# Patient Record
Sex: Female | Born: 1985 | Race: Black or African American | Hispanic: No | Marital: Single | Smoking: Never smoker
Health system: Southern US, Community
[De-identification: ages and names within clinical notes are randomized; demographics above are authoritative.]

## PROBLEM LIST (undated history)

## (undated) ENCOUNTER — Inpatient Hospital Stay (HOSPITAL_COMMUNITY): Payer: Self-pay

## (undated) DIAGNOSIS — F329 Major depressive disorder, single episode, unspecified: Secondary | ICD-10-CM

## (undated) DIAGNOSIS — O139 Gestational [pregnancy-induced] hypertension without significant proteinuria, unspecified trimester: Secondary | ICD-10-CM

## (undated) DIAGNOSIS — F32A Depression, unspecified: Secondary | ICD-10-CM

## (undated) DIAGNOSIS — O24419 Gestational diabetes mellitus in pregnancy, unspecified control: Secondary | ICD-10-CM

## (undated) DIAGNOSIS — Z8619 Personal history of other infectious and parasitic diseases: Secondary | ICD-10-CM

## (undated) DIAGNOSIS — F419 Anxiety disorder, unspecified: Secondary | ICD-10-CM

## (undated) DIAGNOSIS — R51 Headache: Secondary | ICD-10-CM

## (undated) DIAGNOSIS — N390 Urinary tract infection, site not specified: Secondary | ICD-10-CM

## (undated) DIAGNOSIS — D649 Anemia, unspecified: Secondary | ICD-10-CM

## (undated) DIAGNOSIS — B951 Streptococcus, group B, as the cause of diseases classified elsewhere: Secondary | ICD-10-CM

## (undated) DIAGNOSIS — A599 Trichomoniasis, unspecified: Secondary | ICD-10-CM

## (undated) DIAGNOSIS — R87629 Unspecified abnormal cytological findings in specimens from vagina: Secondary | ICD-10-CM

## (undated) HISTORY — DX: Personal history of other infectious and parasitic diseases: Z86.19

## (undated) HISTORY — DX: Unspecified abnormal cytological findings in specimens from vagina: R87.629

## (undated) HISTORY — PX: COLPOSCOPY: SHX161

## (undated) HISTORY — DX: Streptococcus, group b, as the cause of diseases classified elsewhere: B95.1

---
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
## 2006-01-26 ENCOUNTER — Emergency Department (HOSPITAL_COMMUNITY): Admission: EM | Admit: 2006-01-26 | Discharge: 2006-01-26 | Payer: Self-pay | Admitting: Emergency Medicine

## 2006-01-27 ENCOUNTER — Emergency Department (HOSPITAL_COMMUNITY): Admission: EM | Admit: 2006-01-27 | Discharge: 2006-01-27 | Payer: Self-pay | Admitting: Family Medicine

## 2006-03-06 ENCOUNTER — Ambulatory Visit: Payer: Self-pay | Admitting: Family Medicine

## 2006-08-10 ENCOUNTER — Emergency Department (HOSPITAL_COMMUNITY): Admission: EM | Admit: 2006-08-10 | Discharge: 2006-08-10 | Payer: Self-pay | Admitting: Emergency Medicine

## 2007-03-05 ENCOUNTER — Emergency Department (HOSPITAL_COMMUNITY): Admission: EM | Admit: 2007-03-05 | Discharge: 2007-03-05 | Payer: Self-pay | Admitting: Emergency Medicine

## 2008-10-22 ENCOUNTER — Emergency Department (HOSPITAL_COMMUNITY): Admission: EM | Admit: 2008-10-22 | Discharge: 2008-10-22 | Payer: Self-pay | Admitting: Emergency Medicine

## 2009-12-11 ENCOUNTER — Emergency Department (HOSPITAL_COMMUNITY): Admission: EM | Admit: 2009-12-11 | Discharge: 2009-12-11 | Payer: Self-pay | Admitting: Emergency Medicine

## 2010-02-15 ENCOUNTER — Emergency Department (HOSPITAL_COMMUNITY): Admission: EM | Admit: 2010-02-15 | Discharge: 2010-02-15 | Payer: Self-pay | Admitting: Emergency Medicine

## 2010-03-30 LAB — OB RESULTS CONSOLE VARICELLA ZOSTER ANTIBODY, IGG: VARICELLA IGG: IMMUNE

## 2010-04-11 ENCOUNTER — Ambulatory Visit: Payer: Self-pay | Admitting: Obstetrics and Gynecology

## 2010-04-11 ENCOUNTER — Inpatient Hospital Stay (HOSPITAL_COMMUNITY): Admission: AD | Admit: 2010-04-11 | Discharge: 2010-04-11 | Payer: Self-pay | Admitting: Family Medicine

## 2010-05-17 ENCOUNTER — Ambulatory Visit (HOSPITAL_COMMUNITY): Admission: RE | Admit: 2010-05-17 | Discharge: 2010-05-17 | Payer: Self-pay | Admitting: Family Medicine

## 2010-07-24 ENCOUNTER — Inpatient Hospital Stay (HOSPITAL_COMMUNITY): Admission: AD | Admit: 2010-07-24 | Discharge: 2010-07-24 | Payer: Self-pay | Admitting: Obstetrics & Gynecology

## 2010-07-24 ENCOUNTER — Ambulatory Visit: Payer: Self-pay | Admitting: Obstetrics and Gynecology

## 2010-08-15 ENCOUNTER — Inpatient Hospital Stay (HOSPITAL_COMMUNITY): Admission: AD | Admit: 2010-08-15 | Discharge: 2010-08-16 | Payer: Self-pay | Admitting: Obstetrics and Gynecology

## 2010-08-25 ENCOUNTER — Ambulatory Visit: Payer: Self-pay | Admitting: Obstetrics & Gynecology

## 2010-09-01 ENCOUNTER — Ambulatory Visit: Payer: Self-pay | Admitting: Obstetrics & Gynecology

## 2010-09-02 ENCOUNTER — Encounter: Payer: Self-pay | Admitting: Obstetrics and Gynecology

## 2010-09-02 ENCOUNTER — Inpatient Hospital Stay (HOSPITAL_COMMUNITY): Admission: AD | Admit: 2010-09-02 | Discharge: 2010-09-03 | Payer: Self-pay | Admitting: Family Medicine

## 2010-09-02 DIAGNOSIS — O4700 False labor before 37 completed weeks of gestation, unspecified trimester: Secondary | ICD-10-CM

## 2010-09-15 ENCOUNTER — Ambulatory Visit: Payer: Self-pay | Admitting: Family Medicine

## 2010-09-22 ENCOUNTER — Encounter: Payer: Self-pay | Admitting: Obstetrics & Gynecology

## 2010-09-22 ENCOUNTER — Ambulatory Visit: Payer: Self-pay | Admitting: Obstetrics & Gynecology

## 2010-09-29 ENCOUNTER — Ambulatory Visit: Payer: Self-pay | Admitting: Obstetrics & Gynecology

## 2010-10-06 ENCOUNTER — Encounter: Payer: Self-pay | Admitting: Family Medicine

## 2010-10-06 ENCOUNTER — Ambulatory Visit: Payer: Self-pay | Admitting: Family Medicine

## 2010-10-06 LAB — CONVERTED CEMR LAB
ALT: 10 units/L (ref 0–35)
AST: 21 units/L (ref 0–37)
Alkaline Phosphatase: 215 units/L — ABNORMAL HIGH (ref 39–117)
Creatinine, Ser: 0.54 mg/dL (ref 0.40–1.20)
HCT: 31.2 % — ABNORMAL LOW (ref 36.0–46.0)
MCV: 87.2 fL (ref 78.0–100.0)
Platelets: 141 10*3/uL — ABNORMAL LOW (ref 150–400)
RDW: 13.4 % (ref 11.5–15.5)
Total Bilirubin: 0.6 mg/dL (ref 0.3–1.2)

## 2010-10-07 ENCOUNTER — Ambulatory Visit: Payer: Self-pay | Admitting: Obstetrics & Gynecology

## 2010-10-07 ENCOUNTER — Inpatient Hospital Stay (HOSPITAL_COMMUNITY)
Admission: AD | Admit: 2010-10-07 | Discharge: 2010-10-07 | Payer: Self-pay | Source: Home / Self Care | Attending: Obstetrics and Gynecology | Admitting: Obstetrics and Gynecology

## 2010-10-13 ENCOUNTER — Ambulatory Visit: Payer: Self-pay | Admitting: Obstetrics and Gynecology

## 2010-10-13 ENCOUNTER — Inpatient Hospital Stay (HOSPITAL_COMMUNITY)
Admission: AD | Admit: 2010-10-13 | Discharge: 2010-10-14 | Payer: Self-pay | Source: Home / Self Care | Attending: Family Medicine | Admitting: Family Medicine

## 2010-10-15 ENCOUNTER — Inpatient Hospital Stay (HOSPITAL_COMMUNITY)
Admission: AD | Admit: 2010-10-15 | Discharge: 2010-10-17 | Payer: Self-pay | Source: Home / Self Care | Attending: Obstetrics & Gynecology | Admitting: Obstetrics & Gynecology

## 2010-10-17 ENCOUNTER — Ambulatory Visit: Admit: 2010-10-17 | Payer: Self-pay | Admitting: Obstetrics and Gynecology

## 2010-12-24 ENCOUNTER — Emergency Department (HOSPITAL_COMMUNITY): Payer: Medicaid Other

## 2010-12-24 ENCOUNTER — Emergency Department (HOSPITAL_COMMUNITY)
Admission: EM | Admit: 2010-12-24 | Discharge: 2010-12-24 | Disposition: A | Discharge status: Home / Self Care | Payer: Medicaid Other | Attending: Emergency Medicine | Admitting: Emergency Medicine

## 2010-12-24 DIAGNOSIS — Y9229 Other specified public building as the place of occurrence of the external cause: Secondary | ICD-10-CM | POA: Insufficient documentation

## 2010-12-24 DIAGNOSIS — R51 Headache: Secondary | ICD-10-CM | POA: Insufficient documentation

## 2010-12-24 DIAGNOSIS — S01409A Unspecified open wound of unspecified cheek and temporomandibular area, initial encounter: Secondary | ICD-10-CM | POA: Insufficient documentation

## 2010-12-26 LAB — CBC
HCT: 26.3 % — ABNORMAL LOW (ref 36.0–46.0)
Hemoglobin: 10.6 g/dL — ABNORMAL LOW (ref 12.0–15.0)
Hemoglobin: 8.5 g/dL — ABNORMAL LOW (ref 12.0–15.0)
MCH: 28 pg (ref 26.0–34.0)
MCH: 28.2 pg (ref 26.0–34.0)
MCV: 86.5 fL (ref 78.0–100.0)
MCV: 87 fL (ref 78.0–100.0)
Platelets: 138 10*3/uL — ABNORMAL LOW (ref 150–400)
RBC: 3.04 MIL/uL — ABNORMAL LOW (ref 3.87–5.11)
RBC: 3.76 MIL/uL — ABNORMAL LOW (ref 3.87–5.11)

## 2010-12-26 LAB — POCT URINALYSIS DIPSTICK
Bilirubin Urine: NEGATIVE
Glucose, UA: NEGATIVE mg/dL
Hgb urine dipstick: NEGATIVE
Hgb urine dipstick: NEGATIVE
Ketones, ur: NEGATIVE mg/dL
Ketones, ur: NEGATIVE mg/dL
Nitrite: NEGATIVE
Protein, ur: NEGATIVE mg/dL
Protein, ur: NEGATIVE mg/dL
Specific Gravity, Urine: 1.025 (ref 1.005–1.030)
Specific Gravity, Urine: >=1.03 (ref 1.005–1.030)
Specific Gravity, Urine: >=1.03 (ref 1.005–1.030)
Urobilinogen, UA: 1 mg/dL (ref 0.0–1.0)
Urobilinogen, UA: 0.2 mg/dL (ref 0.0–1.0)
Urobilinogen, UA: 2 mg/dL — ABNORMAL HIGH (ref 0.0–1.0)
pH: 6.5 (ref 5.0–8.0)
pH: 6.5 (ref 5.0–8.0)
pH: 7 (ref 5.0–8.0)

## 2010-12-27 ENCOUNTER — Inpatient Hospital Stay (INDEPENDENT_AMBULATORY_CARE_PROVIDER_SITE_OTHER)
Admission: RE | Admit: 2010-12-27 | Discharge: 2010-12-27 | Disposition: A | Discharge status: Home / Self Care | Payer: Medicaid Other | Source: Ambulatory Visit | Attending: Family Medicine | Admitting: Family Medicine

## 2010-12-27 DIAGNOSIS — S01409A Unspecified open wound of unspecified cheek and temporomandibular area, initial encounter: Secondary | ICD-10-CM

## 2010-12-27 LAB — POCT URINALYSIS DIPSTICK
Hgb urine dipstick: NEGATIVE
Nitrite: NEGATIVE
Protein, ur: 30 mg/dL — AB
Protein, ur: NEGATIVE mg/dL
Specific Gravity, Urine: 1.02 (ref 1.005–1.030)
Urobilinogen, UA: 0.2 mg/dL (ref 0.0–1.0)
pH: 7 (ref 5.0–8.0)
pH: 7 (ref 5.0–8.0)

## 2010-12-27 LAB — WET PREP, GENITAL: Clue Cells Wet Prep HPF POC: NONE SEEN

## 2010-12-27 LAB — GC/CHLAMYDIA PROBE AMP, GENITAL: GC Probe Amp, Genital: NEGATIVE

## 2010-12-28 LAB — GC/CHLAMYDIA PROBE AMP, GENITAL
Chlamydia, DNA Probe: NEGATIVE
GC Probe Amp, Genital: NEGATIVE

## 2010-12-28 LAB — URINALYSIS, ROUTINE W REFLEX MICROSCOPIC
Bilirubin Urine: NEGATIVE
Nitrite: NEGATIVE
Protein, ur: NEGATIVE mg/dL
Specific Gravity, Urine: 1.02 (ref 1.005–1.030)
Specific Gravity, Urine: 1.01 (ref 1.005–1.030)
Urobilinogen, UA: 0.2 mg/dL (ref 0.0–1.0)
Urobilinogen, UA: 0.2 mg/dL (ref 0.0–1.0)
pH: 7.5 (ref 5.0–8.0)

## 2010-12-28 LAB — URINE DRUGS OF ABUSE SCREEN W ALC, ROUTINE (REF LAB)
Amphetamine Screen, Ur: NEGATIVE
Cocaine Metabolites: NEGATIVE
Creatinine,U: 150.3 mg/dL
Ethyl Alcohol: <10 mg/dL (ref <10)
Marijuana Metabolite: POSITIVE — AB
Opiate Screen, Urine: NEGATIVE
Propoxyphene: NEGATIVE

## 2010-12-28 LAB — GC/CHLAMYDIA PROBE AMP, URINE
Chlamydia, Swab/Urine, PCR: NEGATIVE
GC Probe Amp, Urine: NEGATIVE

## 2010-12-28 LAB — URINE CULTURE: Culture  Setup Time: 201110091725

## 2010-12-28 LAB — CBC
Hemoglobin: 11.1 g/dL — ABNORMAL LOW (ref 12.0–15.0)
MCH: 30.2 pg (ref 26.0–34.0)
Platelets: 165 10*3/uL (ref 150–400)
RBC: 3.69 MIL/uL — ABNORMAL LOW (ref 3.87–5.11)
WBC: 10.9 10*3/uL — ABNORMAL HIGH (ref 4.0–10.5)

## 2010-12-28 LAB — URINE MICROSCOPIC-ADD ON

## 2010-12-28 LAB — RPR: RPR Ser Ql: NONREACTIVE

## 2010-12-28 LAB — THC (MARIJUANA), URINE, CONFIRMATION: Marijuana, Ur-Confirmation: 30 NG/ML — ABNORMAL HIGH

## 2010-12-28 LAB — STREP B DNA PROBE
Strep Group B Ag: POSITIVE
Strep Group B Ag: NEGATIVE

## 2011-01-01 LAB — URINALYSIS, ROUTINE W REFLEX MICROSCOPIC
Bilirubin Urine: NEGATIVE
Hgb urine dipstick: NEGATIVE
Ketones, ur: 15 mg/dL — AB
Protein, ur: NEGATIVE mg/dL
Urobilinogen, UA: 0.2 mg/dL (ref 0.0–1.0)

## 2011-01-03 LAB — URINALYSIS, ROUTINE W REFLEX MICROSCOPIC
Bilirubin Urine: NEGATIVE
Glucose, UA: NEGATIVE mg/dL
Hgb urine dipstick: NEGATIVE
Ketones, ur: NEGATIVE mg/dL
Protein, ur: NEGATIVE mg/dL

## 2011-01-03 LAB — URINE MICROSCOPIC-ADD ON

## 2011-01-26 ENCOUNTER — Inpatient Hospital Stay (INDEPENDENT_AMBULATORY_CARE_PROVIDER_SITE_OTHER)
Admission: RE | Admit: 2011-01-26 | Discharge: 2011-01-26 | Disposition: A | Discharge status: Home / Self Care | Payer: Medicaid Other | Source: Ambulatory Visit | Attending: Family Medicine | Admitting: Family Medicine

## 2011-01-26 DIAGNOSIS — N390 Urinary tract infection, site not specified: Secondary | ICD-10-CM

## 2011-01-26 DIAGNOSIS — N760 Acute vaginitis: Secondary | ICD-10-CM

## 2011-01-26 LAB — POCT URINALYSIS DIP (DEVICE)
Nitrite: NEGATIVE
pH: 6 (ref 5.0–8.0)

## 2011-01-26 LAB — WET PREP, GENITAL: Trich, Wet Prep: NONE SEEN

## 2011-01-27 LAB — GC/CHLAMYDIA PROBE AMP, GENITAL: Chlamydia, DNA Probe: NEGATIVE

## 2011-01-30 LAB — URINALYSIS, ROUTINE W REFLEX MICROSCOPIC
Nitrite: NEGATIVE
Specific Gravity, Urine: 1.001 — ABNORMAL LOW (ref 1.005–1.030)
Urobilinogen, UA: 0.2 mg/dL (ref 0.0–1.0)

## 2011-01-30 LAB — URINE MICROSCOPIC-ADD ON

## 2011-01-30 LAB — PREGNANCY, URINE: Preg Test, Ur: NEGATIVE

## 2011-03-03 NOTE — Discharge Summary (Signed)
Margaret Cain, Margaret Cain                         ACCOUNT NO.:  [id number]   MEDICAL RECORD NO.:  [id number]                   PATIENT TYPE:  INP   LOCATION:  9152                                 FACILITY:  [facility]   PHYSICIAN:  Conni Elliot, M.D.             DATE OF BIRTH:  12-Feb-1986   DATE OF ADMISSION:  11/22/2003  DATE OF DISCHARGE:  11/27/2003                                 DISCHARGE SUMMARY   DISCHARGE DIAGNOSES:  1. Threatened preterm labor.  2. Intrauterine pregnancy at 30 weeks.  3. GBS bacteruria.   DISCHARGE MEDICATIONS:  Procardia XL 30 mg b.i.d.   SPECIAL INSTRUCTIONS:  The patient instructed to be on bedrest except for up  to eat and shower.   FOLLOWUP:  The patient has an  appointment next Wednesday morning at 9:45 in  the high risk clinic.   BRIEF ADMISSION HISTORY:  This is a 25 year old, G1, who presented at 29-2/7  weeks with cramping, increased pressure in her back and abdomen. The patient  had already received betamethasone times two in the outpatient clinic on  February 3rd and 4th.  She is reporting contractions every five to fifteen  minutes. No leakage of fluid. No vaginal bleeding. The patient was known to  have GBS bacteruria. She was taking iron and prenatal vitamins. The  patient's cervical exam prior to admission was fingertip, long, posterior;  however the patient was 1 cm, long, and posterior. On admission wet prep was  negative.  UA showed 11-20 white blood cells, 2 bacteria, a few squamous,  small leukocyte esterase.   HOSPITAL COURSE:  The patient was admitted for threatened preterm labor.  Urine specimen was sent for culture, but culture had no growth. The patient  was admitted and began on Procardia 30 mg b.i.d. Throughout hospitalization  the patient was continuously monitored by [facility] and external fetal  monitoring. No events on external fetal monitoring with good variability. No  decelerations. Toco revealed uterine irritability, but no  rhythmic  contractions.  The patient received Unasyn for six days during  hospitalization for treatment of her GBS bacteruria.  Social work was  consulted during hospitalization to determine patient's support at home and  it was found that the patient seemed to be supported by a godmother as well  as family; however, family home was crowded with about ten people living  within the home. The patient left hospital without getting prescription for  Procardia. That prescription was called into a pharmacy and the patient was  called to pick the prescription up.     Anastasio Auerbach, MD                          Conni Elliot, M.D.    AD/MEDQ  D:  11/27/2003  T:  11/28/2003  Job:  [id number]

## 2011-03-20 ENCOUNTER — Inpatient Hospital Stay (INDEPENDENT_AMBULATORY_CARE_PROVIDER_SITE_OTHER)
Admission: RE | Admit: 2011-03-20 | Discharge: 2011-03-20 | Disposition: A | Discharge status: Home / Self Care | Payer: Self-pay | Source: Ambulatory Visit | Attending: Family Medicine | Admitting: Family Medicine

## 2011-03-20 DIAGNOSIS — N898 Other specified noninflammatory disorders of vagina: Secondary | ICD-10-CM

## 2011-03-20 LAB — POCT URINALYSIS DIP (DEVICE)
Nitrite: NEGATIVE
Protein, ur: 30 mg/dL — AB
Urobilinogen, UA: 4 mg/dL — ABNORMAL HIGH (ref 0.0–1.0)
pH: 7.5 (ref 5.0–8.0)

## 2011-03-20 LAB — POCT I-STAT, CHEM 8
Creatinine, Ser: 0.9 mg/dL (ref 0.4–1.2)
Glucose, Bld: 88 mg/dL (ref 70–99)
Hemoglobin: 13.9 g/dL (ref 12.0–15.0)
TCO2: 27 mmol/L (ref 0–100)

## 2011-06-09 ENCOUNTER — Encounter: Payer: Self-pay | Admitting: *Deleted

## 2011-06-09 ENCOUNTER — Emergency Department (HOSPITAL_BASED_OUTPATIENT_CLINIC_OR_DEPARTMENT_OTHER)
Admission: EM | Admit: 2011-06-09 | Discharge: 2011-06-09 | Disposition: A | Discharge status: Home / Self Care | Payer: Medicaid Other | Attending: Emergency Medicine | Admitting: Emergency Medicine

## 2011-06-09 DIAGNOSIS — H6120 Impacted cerumen, unspecified ear: Secondary | ICD-10-CM | POA: Insufficient documentation

## 2011-06-09 DIAGNOSIS — T169XXA Foreign body in ear, unspecified ear, initial encounter: Secondary | ICD-10-CM

## 2011-06-09 DIAGNOSIS — IMO0002 Reserved for concepts with insufficient information to code with codable children: Secondary | ICD-10-CM | POA: Insufficient documentation

## 2011-06-09 MED ORDER — NEOMYCIN-COLIST-HC-THONZONIUM 3.3-3-10-0.5 MG/ML OT SUSP
4.0000 [drp] | OTIC | Status: DC
  Administered 2011-06-09: 4 [drp] via OTIC
  Filled 2011-06-09: qty 5

## 2011-06-09 MED ORDER — LIDOCAINE HCL 2 % IJ SOLN
2.0000 mL | Freq: Once | INTRAMUSCULAR | Status: AC
  Administered 2011-06-09: 40 mg

## 2011-06-09 MED ORDER — LIDOCAINE HCL 2 % IJ SOLN
INTRAMUSCULAR | Status: AC
  Filled 2011-06-09: qty 1

## 2011-06-09 NOTE — ED Notes (Signed)
Pt c/o part of q-tip stuck in left ear approx ago. Pt called EMS for complaint. Pt reports "i called them cause i knew i wouldn't be able to sleep tonight if i didn't". Pt also states she will need a cab voucher to go home in.

## 2011-06-09 NOTE — ED Provider Notes (Signed)
History     CSN: [id number] Arrival date & time: 06/09/2011 12:03 AM  Chief Complaint  Patient presents with  . Foreign Body in Ear   HPI This is a 25 year old female who was cleaning her ears in the shower just prior to arrival. She felt the end of a Q-tip break off in her left ear. She states it is causing significant pain in the left ear. She's not been able to remove it. She thinks it is in there deeply. Her hearing is decreased from the left ear as a result.  History reviewed. No pertinent past medical history.  History reviewed. No pertinent past surgical history.  No family history on file.  History  Substance Use Topics  . Smoking status: Never Smoker   . Smokeless tobacco: Not on file  . Alcohol Use: No    OB History    Grav Para Term Preterm Abortions TAB SAB Ect Mult Living                  Review of Systems  All other systems reviewed and are negative.    Physical Exam  BP 102/72  Pulse 68  Temp(Src) 97.9 F (36.6 C) (Oral)  Resp 16  SpO2 100%  LMP 05/29/2011  Physical Exam General: Well-developed, well-nourished female in no acute distress; appearance consistent with age of record; SULFA; cerumen and cotton plug is seen within left external auditory canal HENT: normocephalic, atraumatic Eyes: pupils equal round and reactive to light; extraocular muscles intact Neck: supple Heart: regular rate and rhythm Lungs: clear to auscultation bilaterally Abdomen: soft; nontender; nondistended Extremities: No deformity; full range of motion; pulses normal Neurologic: Awake, alert and oriented;motor function intact in all extremities and symmetric;no facial droop Skin: Warm and dry Psychiatric: Normal mood and affect   ED Course  FOREIGN BODY REMOVAL Date/Time: 06/09/2011 12:55 AM Performed by: Hanley Seamen Authorized by: Hanley Seamen Body area: ear Location details: left ear Anesthesia: local infiltration Local anesthetic: lidocaine 2% without  epinephrine Localization method: visualized and ENT speculum Removal mechanism: alligator forceps and irrigation Complexity: complex Objects recovered: Cerumen; cotton Post-procedure assessment: foreign body removed Patient tolerance: Patient tolerated the procedure well with no immediate complications.    MDM       Hanley Seamen, MD 06/09/11 [phone]

## 2011-06-09 NOTE — ED Notes (Signed)
Pt sent home with Cortisporin bottle and instructed on use. Pt was on the phone talking to someone, and waved her hand in response. Instructed pt to turn phone off and she responded "hold on im talking". Again instructed pt to turn to the side for ear drops, and pt then responded, "make it quick im talking". Pt turned on her side, drops were given, and pt continued to talk on phone. Pt refused to listen to discharge instructions, and instead talked on the phone throughout the whole instruction conversation given.

## 2011-09-17 ENCOUNTER — Encounter (HOSPITAL_COMMUNITY): Payer: Self-pay | Admitting: *Deleted

## 2011-09-17 ENCOUNTER — Inpatient Hospital Stay (HOSPITAL_COMMUNITY)
Admission: AD | Admit: 2011-09-17 | Discharge: 2011-09-17 | Disposition: A | Discharge status: Home / Self Care | Payer: Medicaid Other | Source: Ambulatory Visit | Attending: Obstetrics & Gynecology | Admitting: Obstetrics & Gynecology

## 2011-09-17 ENCOUNTER — Other Ambulatory Visit: Payer: Self-pay | Admitting: Obstetrics & Gynecology

## 2011-09-17 ENCOUNTER — Inpatient Hospital Stay (HOSPITAL_COMMUNITY): Payer: Medicaid Other

## 2011-09-17 DIAGNOSIS — O209 Hemorrhage in early pregnancy, unspecified: Secondary | ICD-10-CM | POA: Insufficient documentation

## 2011-09-17 DIAGNOSIS — O034 Incomplete spontaneous abortion without complication: Secondary | ICD-10-CM

## 2011-09-17 DIAGNOSIS — R109 Unspecified abdominal pain: Secondary | ICD-10-CM | POA: Insufficient documentation

## 2011-09-17 HISTORY — DX: Anemia, unspecified: D64.9

## 2011-09-17 LAB — CBC
HCT: 34.2 % — ABNORMAL LOW (ref 36.0–46.0)
Hemoglobin: 11.1 g/dL — ABNORMAL LOW (ref 12.0–15.0)
MCH: 28.1 pg (ref 26.0–34.0)
MCHC: 32.5 g/dL (ref 30.0–36.0)
MCV: 86.6 fL (ref 78.0–100.0)
Platelets: 189 10*3/uL (ref 150–400)
RBC: 3.95 MIL/uL (ref 3.87–5.11)
RDW: 13 % (ref 11.5–15.5)
WBC: 5.3 10*3/uL (ref 4.0–10.5)

## 2011-09-17 LAB — HCG, QUANTITATIVE, PREGNANCY: hCG, Beta Chain, Quant, S: 9051 m[IU]/mL — ABNORMAL HIGH (ref <5)

## 2011-09-17 MED ORDER — IBUPROFEN 800 MG PO TABS: 800.0000 mg | ORAL_TABLET | Freq: Three times a day (TID) | ORAL | Status: DC

## 2011-09-17 MED ORDER — PROMETHAZINE HCL 25 MG PO TABS: 25.0000 mg | ORAL_TABLET | Freq: Four times a day (QID) | ORAL | Status: DC | PRN

## 2011-09-17 MED ORDER — MISOPROSTOL 200 MCG PO TABS
200.0000 ug | ORAL_TABLET | Freq: Once | ORAL | Status: DC
Start: 2011-09-17 — End: 2011-09-17

## 2011-09-17 MED ORDER — IBUPROFEN 800 MG PO TABS: 800.0000 mg | ORAL_TABLET | Freq: Three times a day (TID) | ORAL | Status: AC

## 2011-09-17 MED ORDER — IBUPROFEN 800 MG PO TABS
800.0000 mg | ORAL_TABLET | Freq: Once | ORAL | Status: AC
  Administered 2011-09-17: 800 mg via ORAL
  Filled 2011-09-17: qty 1

## 2011-09-17 MED ORDER — HYDROCODONE-ACETAMINOPHEN 5-500 MG PO TABS: 1.0000 | ORAL_TABLET | Freq: Four times a day (QID) | ORAL | Status: DC | PRN

## 2011-09-17 MED ORDER — HYDROCODONE-ACETAMINOPHEN 5-500 MG PO TABS: 1.0000 | ORAL_TABLET | Freq: Four times a day (QID) | ORAL | Status: AC | PRN

## 2011-09-17 MED ORDER — MISOPROSTOL 200 MCG PO TABS: 200.0000 ug | ORAL_TABLET | Freq: Once | ORAL | Status: DC

## 2011-09-17 NOTE — Progress Notes (Signed)
Pt reprots having normal vaginal bleeding x2 days. Today the bleeding got very heavy  Passing large clots baseball sized. Pt reprots feeling weak. Pt reports she has missed her last 2 periods but does not think she is pregnant

## 2011-09-17 NOTE — ED Provider Notes (Signed)
History     CSN: [id number] Arrival date & time: 09/17/2011  9:02 AM   None     Chief Complaint  Patient presents with  . Vaginal Bleeding    (Consider location/radiation/quality/duration/timing/severity/associated sxs/prior treatment) HPIMeghan Cain Petit Cain is a 25 y.o. [id number]. Pt c/o bleeding, started lightly 3 days ago, got heavy 1 am. Is dark red, changing a pad q 3-4 hr, passing golf ball sized clots, having cramping. NO recent change in discharge, no UTI S&S or GI changes. 1 partner x 2 m, neg STD screen 05/24/11 with [facility].  Past Medical History  Diagnosis Date  . Anemia     Past Surgical History  Procedure Date  . No past surgeries     No family history on file.  History  Substance Use Topics  . Smoking status: Never Smoker   . Smokeless tobacco: Not on file  . Alcohol Use: No    OB History    Grav Para Term Preterm Abortions TAB SAB Ect Mult Living   3 2 2  0 0 0 0 0 0 2      Review of Systems  Constitutional: Negative for fever and chills.  Gastrointestinal: Negative.   Genitourinary: Positive for vaginal bleeding and pelvic pain. Negative for dysuria, urgency, frequency, vaginal discharge and difficulty urinating.    Allergies  Review of patient's allergies indicates no known allergies.  Home Medications  No current outpatient prescriptions on file.  BP 103/57  Pulse 74  Temp(Src) 97.3 F (36.3 C) (Oral)  Resp 18  Ht 5\' 1"  (1.549 m)  LMP 07/01/2011  Physical Exam  Constitutional: She is oriented to person, place, and time. She appears well-developed and well-nourished.  Genitourinary: There is no tenderness or lesion on the right labia. There is no tenderness or lesion on the left labia. Uterus is enlarged. Right adnexum displays no mass, no tenderness and no fullness. Left adnexum displays no mass, no tenderness and no fullness. There is bleeding around the vagina.       Mod amt bright red bleeding, prob POC in os, teased out  Musculoskeletal:  Normal range of motion.  Neurological: She is alert and oriented to person, place, and time.  Skin: Skin is warm and dry.  Psychiatric: She has a normal mood and affect. Her behavior is normal.    ED Course  Procedures (including critical care time)   Labs Reviewed  POCT PREGNANCY, URINE  POCT PREGNANCY, URINE  CBC  HCG, QUANTITATIVE, PREGNANCY   No results found. Results for orders placed during the hospital encounter of 09/17/11 (from the past 24 hour(s))  POCT PREGNANCY, URINE     Status: Normal   Collection Time   09/17/11  9:26 AM      Component Value Range   Preg Test, Ur POSITIVE    CBC     Status: Abnormal   Collection Time   09/17/11 10:03 AM      Component Value Range   WBC 5.3  4.0 - 10.5 (K/uL)   RBC 3.95  3.87 - 5.11 (MIL/uL)   Hemoglobin 11.1 (*) 12.0 - 15.0 (g/dL)   HCT 14.7 (*) 82.9 - 46.0 (%)   MCV 86.6  78.0 - 100.0 (fL)   MCH 28.1  26.0 - 34.0 (pg)   MCHC 32.5  30.0 - 36.0 (g/dL)   RDW 56.2  13.0 - 86.5 (%)   Platelets 189  150 - 400 (K/uL)  HCG, QUANTITATIVE, PREGNANCY     Status: Abnormal  Collection Time   09/17/11 10:03 AM      Component Value Range   hCG, Beta Chain, Quant, S 9051 (*) <5 (mIU/mL)    US Ob Comp Less 14 Wks  09/17/2011  *RADIOLOGY REPORT*  Clinical Data: vag bleeding; ; CRAMPING.  OBSTETRIC <14 WK [place] AND TRANSVAGINAL OB US  Technique: Both transabdominal and transvaginal ultrasound examinations were performed for complete evaluation of the gestation as well as the maternal uterus, adnexal regions, and pelvic cul-de-sac.  Comparison: 05/17/2010  Findings: No intrauterine pregnancy is visualized.  Endometrium is slightly heterogeneous and measures 14 mm in thickness.  Uterus is retroverted.  No focal abnormality within the uterus.  Ovaries are symmetric in size and echotexture.  No adnexal masses. No free fluid.  IMPRESSION: No intrauterine gestation visualized at this time.  Recommend correlation with quantitative beta HCG level.  This  likely will require follow-up with serial quantitative beta HCG levels and ultrasounds.  Original Report Authenticated By: Cyndie Chime, M.D.     No diagnosis found. ASSESSMENT:  Bleeding in pregnancy, probable incomplete abortion.  Pt blood type O+ per records POC to path  Results and plan of care discussed with the pt  PLAN:  cytotec at home, Rx for Motrin, Phenergan and vicodin as needed Return 12/4 am for repeat BHCG Ectopic precautions reviewed Consulted with Dr Debroah Loop   MDM          Avon Gully. Levaughn Puccinelli 09/17/11 1219

## 2011-09-17 NOTE — Progress Notes (Signed)
Removed possible POC. Pt still having small/moderate vag bleeding

## 2011-09-18 LAB — GC/CHLAMYDIA PROBE AMP, GENITAL
Chlamydia, DNA Probe: NEGATIVE
GC Probe Amp, Genital: NEGATIVE

## 2011-09-20 ENCOUNTER — Encounter (HOSPITAL_COMMUNITY): Payer: Self-pay | Admitting: *Deleted

## 2011-09-20 ENCOUNTER — Inpatient Hospital Stay (HOSPITAL_COMMUNITY)
Admission: AD | Admit: 2011-09-20 | Discharge: 2011-09-20 | Disposition: A | Discharge status: Home / Self Care | Payer: Medicaid Other | Source: Ambulatory Visit | Attending: Obstetrics & Gynecology | Admitting: Obstetrics & Gynecology

## 2011-09-20 ENCOUNTER — Encounter: Payer: Self-pay | Admitting: Obstetrics & Gynecology

## 2011-09-20 DIAGNOSIS — O039 Complete or unspecified spontaneous abortion without complication: Secondary | ICD-10-CM | POA: Insufficient documentation

## 2011-09-20 HISTORY — DX: Headache: R51

## 2011-09-20 HISTORY — DX: Urinary tract infection, site not specified: N39.0

## 2011-09-20 LAB — HCG, QUANTITATIVE, PREGNANCY: hCG, Beta Chain, Quant, S: 1513 m[IU]/mL — ABNORMAL HIGH (ref <5)

## 2011-09-20 NOTE — ED Notes (Signed)
Was given cytotec on Sun.  Pain, pain mgmt  And bleeding discussed.  Will have GYN clinic call with follow up.

## 2011-09-20 NOTE — ED Provider Notes (Signed)
History     No chief complaint on file.  HPI Margaret Cain 25 y.o. 11w 4d gestation.  Was seen on 09-17-11 with vaginal bleeding and clots.  Quant was 9051.  Nothing was seen in the uterus.  Was diagnosed with miscarriage and given Cytotec.  Returns today for repeat quant.   Is currently having some vaginal bleeding.  Still has periodic pain.  Today rates pain 4/10.  Last medication was Ibuprofen 800 mg at 2 pm yesterday and Vicodin at 8 am yesterday.  No pain medication since then and none today.  OB History    Grav Para Term Preterm Abortions TAB SAB Ect Mult Living   3 2 2  0 0 0 0 0 0 2      Past Medical History  Diagnosis Date  . Anemia     Past Surgical History  Procedure Date  . No past surgeries     No family history on file.  History  Substance Use Topics  . Smoking status: Never Smoker   . Smokeless tobacco: Not on file  . Alcohol Use: No    Allergies: No Known Allergies  Prescriptions prior to admission  Medication Sig Dispense Refill  . HYDROcodone-acetaminophen (VICODIN) 5-500 MG per tablet Take 1-2 tablets by mouth every 6 (six) hours as needed for pain.  15 tablet  0  . ibuprofen (ADVIL,MOTRIN) 800 MG tablet Take 1 tablet (800 mg total) by mouth 3 (three) times daily.  21 tablet  0  . misoprostol (CYTOTEC) 200 MCG tablet Take 1 tablet (200 mcg total) by mouth once.  4 tablet  0  . promethazine (PHENERGAN) 25 MG tablet Take 1 tablet (25 mg total) by mouth every 6 (six) hours as needed for nausea.  10 tablet  0    ROS Physical Exam   Blood pressure 124/54, pulse 77, temperature 99.4 F (37.4 C), temperature source Oral, resp. rate 18, last menstrual period 07/01/2011.  Physical Exam  Nursing note and vitals reviewed. Constitutional: She is oriented to person, place, and time. She appears well-developed and well-nourished.  HENT:  Head: Normocephalic.  Eyes: EOM are normal.  Neck: Neck supple.  Musculoskeletal: Normal range of motion.    Neurological: She is alert and oriented to person, place, and time.  Skin: Skin is warm and dry.  Psychiatric: She has a normal mood and affect.    MAU Course  Procedures  MDM Results for orders placed during the hospital encounter of 09/20/11 (from the past 24 hour(s))  HCG, QUANTITATIVE, PREGNANCY     Status: Abnormal   Collection Time   09/20/11  8:35 AM      Component Value Range   hCG, Beta Chain, Quant, S 1513 (*) <5 (mIU/mL)     Assessment and Plan  Miscarriage as evidenced by significant drop in quant  Plan Follow up in GYN clinic Message sent to clinic to schedule an appointment Discussed with client No sex until you have been seen in the clinic Expect to have bleeding 1-2 weeks Take pain medication if needed.   Demarko Zeimet 09/20/2011, 9:16 AM   Nolene Bernheim, NP 09/20/11 [phone]

## 2011-09-23 ENCOUNTER — Inpatient Hospital Stay (HOSPITAL_COMMUNITY)
Admission: AD | Admit: 2011-09-23 | Discharge: 2011-09-23 | Disposition: A | Discharge status: Home / Self Care | Payer: Medicaid Other | Source: Ambulatory Visit | Attending: Obstetrics & Gynecology | Admitting: Obstetrics & Gynecology

## 2011-09-23 ENCOUNTER — Encounter (HOSPITAL_COMMUNITY): Payer: Self-pay | Admitting: *Deleted

## 2011-09-23 DIAGNOSIS — O039 Complete or unspecified spontaneous abortion without complication: Secondary | ICD-10-CM

## 2011-09-23 LAB — CBC
Hemoglobin: 9.4 g/dL — ABNORMAL LOW (ref 12.0–15.0)
MCHC: 32.2 g/dL (ref 30.0–36.0)
RBC: 3.35 MIL/uL — ABNORMAL LOW (ref 3.87–5.11)
WBC: 5.9 10*3/uL (ref 4.0–10.5)

## 2011-09-23 LAB — HCG, QUANTITATIVE, PREGNANCY: hCG, Beta Chain, Quant, S: 456 m[IU]/mL — ABNORMAL HIGH (ref <5)

## 2011-09-23 MED ORDER — KETOROLAC TROMETHAMINE 60 MG/2ML IM SOLN
60.0000 mg | Freq: Once | INTRAMUSCULAR | Status: AC
  Administered 2011-09-23: 60 mg via INTRAMUSCULAR
  Filled 2011-09-23: qty 2

## 2011-09-23 NOTE — ED Provider Notes (Signed)
History     Chief Complaint  Patient presents with  . Vaginal Bleeding   The history is provided by the patient.  pt presents with vaginal bleeding in early pregnancy with known failed pregnancy with cytotec.  Pt was seen on 12/2 with vaginal bleeding with clots and HCG 9051 and ultrasound showing no IUGS.  She was given Cytotec at that time.  On 12/5 she returned with HCG of 1513 and has an appointment to f/u in GYN clinic on Monday 12/10.  This morning at 0500, pt woke up with a blood clot and increase cramping.  She has been changing a pad about 4 times a day.  She took Ibuprofen at 2100 on 09/22/2011. He pain is worse when she urinates or has bowel movement.   Past Medical History  Diagnosis Date  . Anemia   . Headache   . Urinary tract infection     Past Surgical History  Procedure Date  . No past surgeries     Family History  Problem Relation Age of Onset  . Anesthesia problems Neg Hx   . Hypotension Neg Hx   . Malignant hyperthermia Neg Hx   . Pseudochol deficiency Neg Hx     History  Substance Use Topics  . Smoking status: Never Smoker   . Smokeless tobacco: Never Used  . Alcohol Use: No    Allergies: No Known Allergies  Prescriptions prior to admission  Medication Sig Dispense Refill  . HYDROcodone-acetaminophen (VICODIN) 5-500 MG per tablet Take 1-2 tablets by mouth every 6 (six) hours as needed for pain.  15 tablet  0  . ibuprofen (ADVIL,MOTRIN) 800 MG tablet Take 1 tablet (800 mg total) by mouth 3 (three) times daily.  21 tablet  0  . promethazine (PHENERGAN) 25 MG tablet Take 1 tablet (25 mg total) by mouth every 6 (six) hours as needed for nausea.  10 tablet  0    Review of Systems  Constitutional: Negative for fever and chills.  Respiratory: Negative for cough.   Gastrointestinal: Negative for nausea and vomiting.  Genitourinary: Negative for dysuria.  Neurological: Negative for dizziness and headaches.   Physical Exam   Blood pressure 106/70,  pulse 84, temperature 98.4 F (36.9 C), temperature source Oral, resp. rate 20, height 5\' 1"  (1.549 m), weight 99 lb 3.2 oz (44.997 kg), last menstrual period 07/01/2011, SpO2 97.00%.  Physical Exam  Vitals reviewed. Constitutional: She is oriented to person, place, and time. She appears well-developed and well-nourished.  HENT:  Head: Normocephalic.  Eyes: Pupils are equal, round, and reactive to light.  Neck: Normal range of motion. Neck supple.  Cardiovascular: Normal rate.   Respiratory: Effort normal.  GI: Soft. She exhibits no distension. There is no tenderness. There is no rebound and no guarding.  Genitourinary:       Small amount of pink discharge in vault; cervix closed, clean; uterus NSSC NT; adnexal without palpable enlargement or tenderness  Musculoskeletal: Normal range of motion.  Neurological: She is alert and oriented to person, place, and time.  Skin: Skin is warm and dry.  Psychiatric: She has a normal mood and affect.    MAU Course  Procedures   Results for orders placed during the hospital encounter of 09/23/11 (from the past 24 hour(s))  CBC     Status: Abnormal   Collection Time   09/23/11  6:15 AM      Component Value Range   WBC 5.9  4.0 - 10.5 (K/uL)  RBC 3.35 (*) 3.87 - 5.11 (MIL/uL)   Hemoglobin 9.4 (*) 12.0 - 15.0 (g/dL)   HCT 16.1 (*) 09.6 - 46.0 (%)   MCV 87.2  78.0 - 100.0 (fL)   MCH 28.1  26.0 - 34.0 (pg)   MCHC 32.2  30.0 - 36.0 (g/dL)   RDW 04.5  40.9 - 81.1 (%)   Platelets 209  150 - 400 (K/uL)  HCG, QUANTITATIVE, PREGNANCY     Status: Abnormal   Collection Time   09/23/11  6:15 AM      Component Value Range   hCG, Beta Chain, Quant, S 456 (*) <5 (mIU/mL)    Assessment and Plan  Miscarriage with Cytotec- dropping HCG F/u in GYN clinic as scheduled  Albino Bufford 09/23/2011, 6:09 AM

## 2011-09-23 NOTE — Progress Notes (Signed)
G3P2 Found out last Sunday was pregnant. Had u/s and no baby seen in uterus. Follow up blood work Weds and blood was 1500. Called clinic Friday and has appt for Monday afternoon. Woke up at 0500 and saw blood clot when up to BR. Having pain mid upper abd. Sometimes after BM pain "drops down to my uterus".

## 2011-09-23 NOTE — ED Notes (Signed)
0600 S. Chase Picket NP in to see pt

## 2011-09-23 NOTE — Progress Notes (Signed)
Wende Bushy NP notified of pt's admission and status. Will see pt

## 2011-09-23 NOTE — Progress Notes (Signed)
Wende Bushy NP in to see pt. Spec exam done and pt tol well. No specimens obtained. Scant amt bleeding.

## 2011-09-23 NOTE — ED Notes (Signed)
Crackers and sprite to pt 

## 2011-09-25 ENCOUNTER — Ambulatory Visit (INDEPENDENT_AMBULATORY_CARE_PROVIDER_SITE_OTHER): Payer: Medicaid Other | Admitting: Physician Assistant

## 2011-09-25 ENCOUNTER — Encounter: Payer: Self-pay | Admitting: Physician Assistant

## 2011-09-25 VITALS — BP 126/66 | HR 95 | Temp 97.3°F | Ht 61.0 in | Wt 97.2 lb

## 2011-09-25 DIAGNOSIS — O021 Missed abortion: Secondary | ICD-10-CM

## 2011-09-25 MED ORDER — OXYCODONE-ACETAMINOPHEN 10-325 MG PO TABS: 1.0000 | ORAL_TABLET | Freq: Four times a day (QID) | ORAL | Status: AC | PRN

## 2011-09-25 MED ORDER — CYCLOBENZAPRINE HCL 10 MG PO TABS: 10.0000 mg | ORAL_TABLET | Freq: Three times a day (TID) | ORAL | Status: DC | PRN

## 2011-09-25 NOTE — Patient Instructions (Signed)
Incomplete Miscarriage Miscarriages in pregnancy are common. A miscarriage is a pregnancy that has ended before the twentieth week. You have had an incomplete miscarriage. Partial parts of the fetus or placenta (afterbirth) remain behind. Sometimes further treatment is needed. The most common reason for further treatment is continued bleeding (hemorrhage). Tissue left behind may also become infected. Treatment usually is curettage. Curettage for an incomplete abortion is a procedure in which the remaining products of pregnancy are removed. This can be done by a simple sucking procedure (suction curettage). It can also be done by a simple scraping (curettage) of the inside of the uterus (womb). This may be done in the hospital or in the caregiver's office. This is only done when your caregiver knows the pregnancy has ended. This is determined by physical examination and a negative pregnancy test. It may also include an ultrasound to confirm a dead fetus. The ultrasound may also prove that products of the pregnancy remain in the uterus. If your cervix remains dilated and you are still passing clots and tissue, your caregiver may wish to watch you for a little while. Your caregiver may want to see if you are going to finish passing all of the remaining parts of the pregnancy. If the bleeding continues, they may proceed with curettage. WHY DO I FEEL THIS WAY Miscarriages can be a very emotional time for prospective mothers. This is not you or your partner's fault. The miscarriage did not occur because of a lack in you or your partner. Nearly all miscarriages occur because the pregnancy has started off wrongly. At least half of miscarried pregnancies have a chromosomal abnormality (almost always not inherited). Others may have developmental problems with the fetus or placentas. Problems may not show up even when the products miscarried are studied under the microscope. You can usually begin trying for another  pregnancy as soon as your caregiver says it's okay. HOME CARE INSTRUCTIONS   Your caregiver may order bed rest (this means only getting up to use the bathroom). Your caregiver may allow you to continue light activity. If curettage was not done at this time, but you require further treatment.   Keep track of the number of pads you use each day. Keep track of how saturated (soaked) they are. Record this information.   Do not use tampons. Do not douche or have sexual intercourse until approved by your caregiver.   It is very important to keep all follow-up appointments for re-evaluation and continuing management.   Women who have an Rh negative blood type (ie, A, B, AB, or O negative) need to receive a drug called Rh(D) immune globulin. This medicine helps protect future fetuses against problems that can occur if an Rh negative mother is carrying a baby who is Rh positive.  SEEK IMMEDIATE MEDICAL CARE IF:   You experience severe cramps in your stomach, back, or abdomen.   You run an unexplained temperature (record these).   You pass large clots or tissue (save any tissue for your caregiver to inspect).   Your bleeding increases or you become light-headed, weak, or have fainting episodes.  MAKE SURE YOU:   Understand these instructions.   Will watch your condition.   Will get help right away if you are not doing well or get worse.  Document Released: 10/02/2005 Document Revised: 06/14/2011 Document Reviewed: 05/22/2008 [facility] Patient Information 2012 [place], [place].

## 2011-09-26 ENCOUNTER — Telehealth: Payer: Self-pay | Admitting: *Deleted

## 2011-09-26 LAB — HCG, QUANTITATIVE, PREGNANCY: hCG, Beta Chain, Quant, S: 247.2 m[IU]/mL

## 2011-09-26 NOTE — Telephone Encounter (Signed)
Called pt and left message on personal voice mail that her recent lab test shows improved hormone level. No further tests are needed. She may call back if she has questions or problems.

## 2011-09-26 NOTE — Telephone Encounter (Signed)
Message copied by Jill Side on Tue Sep 26, 2011  3:14 PM ------      Message from: August Luz      Created: Tue Sep 26, 2011  2:50 PM       Please call patient with result. No further labs needed.

## 2011-10-17 NOTE — L&D Delivery Note (Signed)
Delivery Note At 3:47 PM a viable female was delivered via Vaginal, Spontaneous Delivery (Presentation: Left Occiput Anterior).  APGAR: 9, 9; weight .   Placenta status: Intact, Spontaneous.  Cord: 3 vessels with the following complications: None.  Cord pH: not done  Anesthesia: Epidural  Episiotomy: None Lacerations:  Suture Repair: 2.0 Est. Blood Loss (mL):   Mom to postpartum.  Baby to nursery-stable.  Micahel Omlor A 07/17/2012, 4:03 PM

## 2012-02-24 ENCOUNTER — Encounter (HOSPITAL_COMMUNITY): Payer: Self-pay

## 2012-02-24 ENCOUNTER — Inpatient Hospital Stay (HOSPITAL_COMMUNITY)
Admission: AD | Admit: 2012-02-24 | Discharge: 2012-02-24 | Disposition: A | Discharge status: Home / Self Care | Payer: Medicaid Other | Source: Ambulatory Visit | Attending: Obstetrics and Gynecology | Admitting: Obstetrics and Gynecology

## 2012-02-24 DIAGNOSIS — N949 Unspecified condition associated with female genital organs and menstrual cycle: Secondary | ICD-10-CM | POA: Insufficient documentation

## 2012-02-24 DIAGNOSIS — L293 Anogenital pruritus, unspecified: Secondary | ICD-10-CM | POA: Insufficient documentation

## 2012-02-24 DIAGNOSIS — B3731 Acute candidiasis of vulva and vagina: Secondary | ICD-10-CM | POA: Insufficient documentation

## 2012-02-24 DIAGNOSIS — O2390 Unspecified genitourinary tract infection in pregnancy, unspecified trimester: Secondary | ICD-10-CM | POA: Insufficient documentation

## 2012-02-24 DIAGNOSIS — B373 Candidiasis of vulva and vagina: Secondary | ICD-10-CM

## 2012-02-24 LAB — WET PREP, GENITAL: Trich, Wet Prep: NONE SEEN

## 2012-02-24 LAB — URINALYSIS, ROUTINE W REFLEX MICROSCOPIC
Bilirubin Urine: NEGATIVE
Nitrite: NEGATIVE
Specific Gravity, Urine: 1.015 (ref 1.005–1.030)
Urobilinogen, UA: 0.2 mg/dL (ref 0.0–1.0)

## 2012-02-24 LAB — URINE MICROSCOPIC-ADD ON

## 2012-02-24 MED ORDER — FLUCONAZOLE 150 MG PO TABS
150.0000 mg | ORAL_TABLET | Freq: Once | ORAL | Status: AC
  Administered 2012-02-24: 150 mg via ORAL
  Filled 2012-02-24: qty 1

## 2012-02-24 MED ORDER — TERCONAZOLE 0.4 % VA CREA: 1.0000 | TOPICAL_CREAM | Freq: Every day | VAGINAL | Status: AC

## 2012-02-24 NOTE — Discharge Instructions (Signed)
Begin prenatal care as soon as possible. Get your prescription filled and use as directed

## 2012-02-24 NOTE — MAU Provider Note (Signed)
History     CSN: [id number]  Arrival date and time: 02/24/12 0121   First Provider Initiated Contact with Patient 02/24/12 0154      No chief complaint on file.  HPI Margaret Cain 26 y.o. [redacted]w[redacted]d Comes to MAU with vaginal itching and discharge.  Has not yet started prenatal care.  OB History    Grav Para Term Preterm Abortions TAB SAB Ect Mult Living   4 2 2  0 1 0 1 0 0 2      Past Medical History  Diagnosis Date  . Anemia   . Headache   . Urinary tract infection     Past Surgical History  Procedure Date  . No past surgeries     Family History  Problem Relation Age of Onset  . Anesthesia problems Neg Hx   . Hypotension Neg Hx   . Malignant hyperthermia Neg Hx   . Pseudochol deficiency Neg Hx     History  Substance Use Topics  . Smoking status: Never Smoker   . Smokeless tobacco: Never Used  . Alcohol Use: No    Allergies: No Known Allergies  Prescriptions prior to admission  Medication Sig Dispense Refill  . Prenatal Vit-Fe Fumarate-FA (PRENATAL MULTIVITAMIN) TABS Take 1 tablet by mouth daily.        Review of Systems  Gastrointestinal: Negative for nausea and vomiting.       Occasional cramping  Genitourinary: Negative for dysuria.       Vaginal discharge with itching   Physical Exam   Blood pressure 108/57, pulse 83, temperature 98 F (36.7 C), temperature source Oral, resp. rate 16, height 5' (1.524 m), weight 99 lb (44.906 kg), last menstrual period 12/13/2011.  Physical Exam  Nursing note and vitals reviewed. Constitutional: She is oriented to person, place, and time. She appears well-developed and well-nourished.  HENT:  Head: Normocephalic.  Eyes: EOM are normal.  Neck: Neck supple.  GI: Soft. There is no tenderness.       FHT 158 by doppler  Genitourinary:       Speculum exam: Vagina - Mod amount of grainy, adherent discharge, no odor Cervix - No contact bleeding Bimanual exam: Cervix closed Uterus 10w size Adnexa non tender,  no masses bilaterally GC/Chlam, wet prep done Chaperone present for exam.  Musculoskeletal: Normal range of motion.  Neurological: She is alert and oriented to person, place, and time.  Skin: Skin is warm and dry.  Psychiatric: She has a normal mood and affect.    MAU Course  Procedures  MDM Results for orders placed during the hospital encounter of 02/24/12 (from the past 24 hour(s))  URINALYSIS, ROUTINE W REFLEX MICROSCOPIC     Status: Abnormal   Collection Time   02/24/12  1:24 AM      Component Value Range   Color, Urine YELLOW  YELLOW    APPearance CLEAR  CLEAR    Specific Gravity, Urine 1.015  1.005 - 1.030    pH 6.5  5.0 - 8.0    Glucose, UA NEGATIVE  NEGATIVE (mg/dL)   Hgb urine dipstick TRACE (*) NEGATIVE    Bilirubin Urine NEGATIVE  NEGATIVE    Ketones, ur NEGATIVE  NEGATIVE (mg/dL)   Protein, ur NEGATIVE  NEGATIVE (mg/dL)   Urobilinogen, UA 0.2  0.0 - 1.0 (mg/dL)   Nitrite NEGATIVE  NEGATIVE    Leukocytes, UA LARGE (*) NEGATIVE   URINE MICROSCOPIC-ADD ON     Status: Abnormal   Collection Time  02/24/12  1:24 AM      Component Value Range   Squamous Epithelial / LPF FEW (*) RARE    WBC, UA 7-10  <3 (WBC/hpf)   RBC / HPF 3-6  <3 (RBC/hpf)   Bacteria, UA FEW (*) RARE   POCT PREGNANCY, URINE     Status: Abnormal   Collection Time   02/24/12  1:32 AM      Component Value Range   Preg Test, Ur POSITIVE (*) NEGATIVE   WET PREP, GENITAL     Status: Abnormal   Collection Time   02/24/12  1:50 AM      Component Value Range   Yeast Wet Prep HPF POC MODERATE (*) NONE SEEN    Trich, Wet Prep NONE SEEN  NONE SEEN    Clue Cells Wet Prep HPF POC MODERATE (*) NONE SEEN    WBC, Wet Prep HPF POC MODERATE (*) NONE SEEN     Assessment and Plan  Yeast infection in pregnancy  Plan Begin prenatal care as soon as possible rx terazol vaginal cream one applicatorful in vagina at bedtime x 7 days (1 tube) no refills Will give diflucan 150 mg po single dose  now.   Amanda Pote 02/24/2012, 1:55 AM

## 2012-02-24 NOTE — MAU Note (Signed)
Pt states boyfriend told her he may have given her an STD, states his last STD check was negative but still thinks something is wrong. Reports vaginal swelling and itching x2 days. Thick white discharge during pregnancy. Denies vaginal bleeding. Reports cramping in middle abdomen since yesterday. Burning with urination tonight.

## 2012-02-26 LAB — GC/CHLAMYDIA PROBE AMP, GENITAL: GC Probe Amp, Genital: NEGATIVE

## 2012-03-11 NOTE — MAU Provider Note (Signed)
Attestation of Attending Supervision of Advanced Practitioner: Evaluation and management procedures were performed by the PA/NP/CNM/OB Fellow under my supervision/collaboration. Chart reviewed and agree with management and plan.  Johnanna Bakke V 03/11/2012 8:56 PM

## 2012-03-12 ENCOUNTER — Telehealth (HOSPITAL_COMMUNITY): Payer: Self-pay | Admitting: *Deleted

## 2012-04-17 ENCOUNTER — Encounter (HOSPITAL_COMMUNITY): Payer: Self-pay | Admitting: *Deleted

## 2012-04-17 ENCOUNTER — Emergency Department (HOSPITAL_COMMUNITY)
Admission: EM | Admit: 2012-04-17 | Discharge: 2012-04-17 | Disposition: A | Discharge status: Home / Self Care | Payer: Medicaid Other | Attending: Emergency Medicine | Admitting: Emergency Medicine

## 2012-04-17 DIAGNOSIS — R05 Cough: Secondary | ICD-10-CM | POA: Insufficient documentation

## 2012-04-17 DIAGNOSIS — Z8249 Family history of ischemic heart disease and other diseases of the circulatory system: Secondary | ICD-10-CM | POA: Insufficient documentation

## 2012-04-17 DIAGNOSIS — Z862 Personal history of diseases of the blood and blood-forming organs and certain disorders involving the immune mechanism: Secondary | ICD-10-CM | POA: Insufficient documentation

## 2012-04-17 DIAGNOSIS — R0981 Nasal congestion: Secondary | ICD-10-CM

## 2012-04-17 DIAGNOSIS — R059 Cough, unspecified: Secondary | ICD-10-CM

## 2012-04-17 DIAGNOSIS — J3489 Other specified disorders of nose and nasal sinuses: Secondary | ICD-10-CM | POA: Insufficient documentation

## 2012-04-17 DIAGNOSIS — R51 Headache: Secondary | ICD-10-CM | POA: Insufficient documentation

## 2012-04-17 DIAGNOSIS — R519 Headache, unspecified: Secondary | ICD-10-CM

## 2012-04-17 DIAGNOSIS — Z87440 Personal history of urinary (tract) infections: Secondary | ICD-10-CM | POA: Insufficient documentation

## 2012-04-17 MED ORDER — DIPHENHYDRAMINE HCL 25 MG PO CAPS
25.0000 mg | ORAL_CAPSULE | Freq: Once | ORAL | Status: AC
  Administered 2012-04-17: 25 mg via ORAL
  Filled 2012-04-17: qty 1

## 2012-04-17 NOTE — ED Notes (Signed)
Patient in to room 34 to sit with her friend.

## 2012-04-17 NOTE — ED Provider Notes (Signed)
History     CSN: [id number]  Arrival date & time 04/17/12  8413   First MD Initiated Contact with Patient 04/17/12 [phone]      Chief Complaint  Patient presents with  . Cough    (Consider location/radiation/quality/duration/timing/severity/associated sxs/prior treatment) HPI Comments: Pt is about [redacted] weeks pregnant, she has HA, nasal congestion and cough, hasn't been able to sleep much in past few nights, mainly due to coughing.  She denies allergies typically.  She is otherwise healthy.  Has not taken any meds PTA.  She doesn't smoke.    Patient is a 26 y.o. female presenting with cough. The history is provided by the patient.  Cough Associated symptoms include headaches. Pertinent negatives include no chills, no sore throat, no shortness of breath and no wheezing.    Past Medical History  Diagnosis Date  . Anemia   . Headache   . Urinary tract infection     Past Surgical History  Procedure Date  . No past surgeries     Family History  Problem Relation Age of Onset  . Anesthesia problems Neg Hx   . Hypotension Neg Hx   . Malignant hyperthermia Neg Hx   . Pseudochol deficiency Neg Hx     History  Substance Use Topics  . Smoking status: Never Smoker   . Smokeless tobacco: Never Used  . Alcohol Use: No    OB History    Grav Para Term Preterm Abortions TAB SAB Ect Mult Living   4 2 2  0 1 0 1 0 0 2      Review of Systems  Constitutional: Negative for fever and chills.  HENT: Positive for congestion. Negative for sore throat.   Respiratory: Positive for cough. Negative for shortness of breath and wheezing.   Gastrointestinal: Negative for nausea, vomiting and abdominal pain.  Genitourinary: Negative for vaginal bleeding.  Neurological: Positive for headaches.    Allergies  Review of patient's allergies indicates no known allergies.  Home Medications   Current Outpatient Rx  Name Route Sig Dispense Refill  . PRENATAL MULTIVITAMIN CH Oral Take 1 tablet by  mouth daily.      BP 93/62  Pulse 105  Temp 98.2 F (36.8 C) (Oral)  Resp 20  SpO2 97%  LMP 12/13/2011  Physical Exam  Nursing note and vitals reviewed. Constitutional: She appears well-developed and well-nourished. No distress.  HENT:  Head: Normocephalic.  Nose: Mucosal edema present.  Mouth/Throat: Uvula is midline and oropharynx is clear and moist.  Cardiovascular: Normal rate.   Pulmonary/Chest: Effort normal. No respiratory distress. She has no wheezes.       + dry cough  Abdominal: Soft.       gravid    ED Course  Procedures (including critical care time)  Labs Reviewed - No data to display No results found.   1. Nasal congestion   2. Sinus headache   3. Cough     I provided literature regarding pregnancy and safe medications.    MDM  RA sat is 97% and normal by my interpretation.  I discussed risk and benefits of category C medication like Tussionex and pt declines use.  I informed her that benadryl and tylenol are safe in pregnancy.  I suspect she has allergies, or simply congestion related to pregnancy which is common and she has some post nasal drainage causing cough.  No fever.  Lungs clear.  Don't suspect pneumonia or sig bronchitis.  Benadryl given here.  Gavin Pound. Oletta Lamas, MD 04/17/12 1610

## 2012-04-17 NOTE — ED Notes (Signed)
Patient presents with c/o cough for the past 2 days.  Lungs clear, dry cough

## 2012-04-20 ENCOUNTER — Inpatient Hospital Stay (HOSPITAL_COMMUNITY): Payer: Medicaid Other

## 2012-04-20 ENCOUNTER — Encounter (HOSPITAL_COMMUNITY): Payer: Self-pay | Admitting: *Deleted

## 2012-04-20 ENCOUNTER — Inpatient Hospital Stay (HOSPITAL_COMMUNITY)
Admission: AD | Admit: 2012-04-20 | Discharge: 2012-04-20 | Disposition: A | Discharge status: Home / Self Care | Payer: Medicaid Other | Source: Ambulatory Visit | Attending: Obstetrics | Admitting: Obstetrics

## 2012-04-20 DIAGNOSIS — M545 Low back pain, unspecified: Secondary | ICD-10-CM

## 2012-04-20 DIAGNOSIS — M62830 Muscle spasm of back: Secondary | ICD-10-CM

## 2012-04-20 DIAGNOSIS — W19XXXA Unspecified fall, initial encounter: Secondary | ICD-10-CM

## 2012-04-20 DIAGNOSIS — W010XXA Fall on same level from slipping, tripping and stumbling without subsequent striking against object, initial encounter: Secondary | ICD-10-CM | POA: Insufficient documentation

## 2012-04-20 DIAGNOSIS — R1084 Generalized abdominal pain: Secondary | ICD-10-CM

## 2012-04-20 DIAGNOSIS — O368190 Decreased fetal movements, unspecified trimester, not applicable or unspecified: Secondary | ICD-10-CM

## 2012-04-20 DIAGNOSIS — Z3490 Encounter for supervision of normal pregnancy, unspecified, unspecified trimester: Secondary | ICD-10-CM

## 2012-04-20 DIAGNOSIS — M549 Dorsalgia, unspecified: Secondary | ICD-10-CM | POA: Insufficient documentation

## 2012-04-20 DIAGNOSIS — Y92009 Unspecified place in unspecified non-institutional (private) residence as the place of occurrence of the external cause: Secondary | ICD-10-CM | POA: Insufficient documentation

## 2012-04-20 DIAGNOSIS — R109 Unspecified abdominal pain: Secondary | ICD-10-CM | POA: Insufficient documentation

## 2012-04-20 DIAGNOSIS — S39011A Strain of muscle, fascia and tendon of abdomen, initial encounter: Secondary | ICD-10-CM

## 2012-04-20 LAB — CBC
HCT: 30.7 % — ABNORMAL LOW (ref 36.0–46.0)
Hemoglobin: 10 g/dL — ABNORMAL LOW (ref 12.0–15.0)
MCH: 28.2 pg (ref 26.0–34.0)
MCHC: 32.6 g/dL (ref 30.0–36.0)
MCV: 86.7 fL (ref 78.0–100.0)
Platelets: 187 10*3/uL (ref 150–400)
RBC: 3.54 MIL/uL — ABNORMAL LOW (ref 3.87–5.11)
RDW: 12.5 % (ref 11.5–15.5)
WBC: 8.3 10*3/uL (ref 4.0–10.5)

## 2012-04-20 LAB — URINE MICROSCOPIC-ADD ON

## 2012-04-20 LAB — URINALYSIS, ROUTINE W REFLEX MICROSCOPIC
Bilirubin Urine: NEGATIVE
Glucose, UA: NEGATIVE mg/dL
Ketones, ur: NEGATIVE mg/dL
Nitrite: NEGATIVE
pH: 7.5 (ref 5.0–8.0)

## 2012-04-20 MED ORDER — CYCLOBENZAPRINE HCL 10 MG PO TABS
5.0000 mg | ORAL_TABLET | Freq: Once | ORAL | Status: AC
  Administered 2012-04-20: 5 mg via ORAL
  Filled 2012-04-20: qty 1

## 2012-04-20 MED ORDER — GUAIFENESIN 100 MG/5ML PO SOLN
5.0000 mL | Freq: Once | ORAL | Status: AC
  Administered 2012-04-20: 100 mg via ORAL
  Filled 2012-04-20: qty 15

## 2012-04-20 MED ORDER — ACETAMINOPHEN 325 MG PO TABS
650.0000 mg | ORAL_TABLET | Freq: Once | ORAL | Status: AC
  Administered 2012-04-20: 650 mg via ORAL
  Filled 2012-04-20: qty 2

## 2012-04-20 NOTE — MAU Note (Signed)
Slipped around 12:30 after mopping the floor onto her back has not felt the baby move, no vaginal bleeding sees Dr.  Gaynell Face for her prenatal care [facility] 08/28/2012

## 2012-04-20 NOTE — MAU Note (Signed)
Pt reports she slipped and fell backward on her back. Did not feel baby move. Pt reports feeling baby move now after she heard the heart beat.

## 2012-04-20 NOTE — MAU Provider Note (Signed)
History     CSN: [id number]  Arrival date & time 04/20/12  1413   None     Chief Complaint  Patient presents with  . Decreased Fetal Movement   HPI  Margaret Cain is a 26 y.o. female @ [redacted]w[redacted]d gestational who presents to MAU for abdominal pain and back pain. She reports that she fell earlier today while mopping her kitchen floor. She slipped and fell on her back. Shortly after she felt tightening in her lower abdomen. Did not think the baby was moving as much as usual but after got here felt a lot of movement. Cramping in stomach has improved but has continued to have low back pain. Denies vaginal bleeding or leaking of fluid. The back pain is in the lower back. She rates her pain as 8/10. The history was provided by the patient.   Past Medical History  Diagnosis Date  . Anemia   . Headache   . Urinary tract infection     Past Surgical History  Procedure Date  . No past surgeries     Family History  Problem Relation Age of Onset  . Anesthesia problems Neg Hx   . Hypotension Neg Hx   . Malignant hyperthermia Neg Hx   . Pseudochol deficiency Neg Hx     History  Substance Use Topics  . Smoking status: Never Smoker   . Smokeless tobacco: Never Used  . Alcohol Use: No    OB History    Grav Para Term Preterm Abortions TAB SAB Ect Mult Living   4 2 2  0 1 0 1 0 0 2      Review of Systems  Constitutional: Negative for fever, chills, diaphoresis and fatigue.  HENT: Positive for congestion. Negative for ear pain, sore throat, facial swelling, neck pain, neck stiffness, dental problem and sinus pressure.   Eyes: Negative for photophobia, pain, discharge and visual disturbance.  Respiratory: Positive for cough. Negative for chest tightness and wheezing.   Cardiovascular: Negative for chest pain and palpitations.  Gastrointestinal: Positive for abdominal pain. Negative for nausea, vomiting, diarrhea, constipation and abdominal distention.  Genitourinary: Positive for pelvic  pain. Negative for dysuria, urgency, frequency, flank pain, vaginal bleeding, vaginal discharge and difficulty urinating.  Musculoskeletal: Positive for back pain. Negative for myalgias and gait problem.  Skin: Negative for color change and rash.  Neurological: Negative for dizziness, speech difficulty, weakness, light-headedness, numbness and headaches.  Psychiatric/Behavioral: Negative for confusion and agitation. The patient is not nervous/anxious.     Allergies  Review of patient's allergies indicates no known allergies.  Home Medications  No current outpatient prescriptions on file.  BP 96/61  Pulse 102  Temp 98.7 F (37.1 C) (Oral)  Resp 16  Ht 5\' 1"  (1.549 m)  Wt 102 lb 3.2 oz (46.358 kg)  BMI 19.31 kg/m2  LMP 12/13/2011  Physical Exam  Nursing note and vitals reviewed. Constitutional: She is oriented to person, place, and time. She appears well-developed and well-nourished.  HENT:  Head: Normocephalic.  Eyes: EOM are normal.  Neck: Neck supple.  Cardiovascular: Normal rate.   Pulmonary/Chest: Effort normal.  Abdominal: Soft. There is tenderness in the right lower quadrant and left lower quadrant. There is no rebound, no guarding and no CVA tenderness.       + FHT's  Musculoskeletal:       Tender with palpation and ROM left lumbar area.  Neurological: She is alert and oriented to person, place, and time. No cranial nerve deficit.  Skin: Skin is warm and dry.  Psychiatric: She has a normal mood and affect. Her behavior is normal. Judgment and thought content normal.   Results for orders placed during the hospital encounter of 04/20/12 (from the past 24 hour(s))  CBC     Status: Abnormal   Collection Time   04/20/12  3:20 PM      Component Value Range   WBC 8.3  4.0 - 10.5 K/uL   RBC 3.54 (*) 3.87 - 5.11 MIL/uL   Hemoglobin 10.0 (*) 12.0 - 15.0 g/dL   HCT 16.1 (*) 09.6 - 04.5 %   MCV 86.7  78.0 - 100.0 fL   MCH 28.2  26.0 - 34.0 pg   MCHC 32.6  30.0 - 36.0 g/dL     RDW 40.9  81.1 - 91.4 %   Platelets 187  150 - 400 K/uL  URINALYSIS, ROUTINE W REFLEX MICROSCOPIC     Status: Abnormal   Collection Time   04/20/12  7:45 PM      Component Value Range   Color, Urine YELLOW  YELLOW   APPearance CLEAR  CLEAR   Specific Gravity, Urine 1.010  1.005 - 1.030   pH 7.5  5.0 - 8.0   Glucose, UA NEGATIVE  NEGATIVE mg/dL   Hgb urine dipstick NEGATIVE  NEGATIVE   Bilirubin Urine NEGATIVE  NEGATIVE   Ketones, ur NEGATIVE  NEGATIVE mg/dL   Protein, ur NEGATIVE  NEGATIVE mg/dL   Urobilinogen, UA 1.0  0.0 - 1.0 mg/dL   Nitrite NEGATIVE  NEGATIVE   Leukocytes, UA SMALL (*) NEGATIVE  URINE MICROSCOPIC-ADD ON     Status: Abnormal   Collection Time   04/20/12  7:45 PM      Component Value Range   Squamous Epithelial / LPF MANY (*) RARE   WBC, UA 11-20  <3 WBC/hpf   Bacteria, UA RARE  RARE   Urine-Other MUCOUS PRESENT     US Ob Limited  04/20/2012  *RADIOLOGY REPORT*  Clinical Data: Fall, low abdominal pain.  [redacted] weeks pregnant  LIMITED OBSTETRIC ULTRASOUND  Number of Fetuses: 1 Heart Rate: 155bpm Movement: Appropriate Presentation: Cephalic Placental Location: Lateral Previa: None Amniotic Fluid (Subjective): Normal  Vertical Pocket 5.8 cm  BPD:  5.5cm 22w   sixd        EDC:  08/18/2012  MATERNAL FINDINGS: Cervix: 3.2 cm Uterus/Adnexae:  Normal ovaries  IMPRESSION: Single intrauterine fetus with normal heart rate.  Estimated gestational age by biparietal diameter equals 22 weeks extends.  Recommend followup with non-emergent complete OB 14+ wk US examination for fetal biometric evaluation and anatomic survey if not already performed.  Original Report Authenticated By: Genevive Bi, M.D.   Assessment: 26 y.o. @ [redacted]w[redacted]d gestation s/p fall   Muscle strain/low back pain   Abdominal pain   No hematuria    Plan:  Flexeril 5 mg po now   Tylenol as needed for discomfort   Follow up in the office   Return as needed. ED Course  Procedures  Re evaluation: Patient feeling  much better after flexeril. Has been sleeping in exam room.  MDM

## 2012-05-22 ENCOUNTER — Inpatient Hospital Stay (HOSPITAL_COMMUNITY)
Admission: AD | Admit: 2012-05-22 | Discharge: 2012-05-24 | DRG: 781 | Disposition: A | Discharge status: Home / Self Care | Payer: Medicaid Other | Source: Ambulatory Visit | Attending: Obstetrics | Admitting: Obstetrics

## 2012-05-22 ENCOUNTER — Encounter (HOSPITAL_COMMUNITY): Payer: Self-pay | Admitting: *Deleted

## 2012-05-22 DIAGNOSIS — N390 Urinary tract infection, site not specified: Secondary | ICD-10-CM | POA: Diagnosis present

## 2012-05-22 DIAGNOSIS — O368190 Decreased fetal movements, unspecified trimester, not applicable or unspecified: Secondary | ICD-10-CM | POA: Diagnosis present

## 2012-05-22 DIAGNOSIS — O99891 Other specified diseases and conditions complicating pregnancy: Principal | ICD-10-CM | POA: Diagnosis present

## 2012-05-22 DIAGNOSIS — O021 Missed abortion: Secondary | ICD-10-CM

## 2012-05-22 DIAGNOSIS — O210 Mild hyperemesis gravidarum: Secondary | ICD-10-CM | POA: Diagnosis present

## 2012-05-22 DIAGNOSIS — O2390 Unspecified genitourinary tract infection in pregnancy, unspecified trimester: Secondary | ICD-10-CM | POA: Diagnosis present

## 2012-05-22 DIAGNOSIS — R109 Unspecified abdominal pain: Secondary | ICD-10-CM | POA: Diagnosis present

## 2012-05-22 DIAGNOSIS — M549 Dorsalgia, unspecified: Secondary | ICD-10-CM | POA: Diagnosis present

## 2012-05-22 HISTORY — DX: Depression, unspecified: F32.A

## 2012-05-22 HISTORY — DX: Gestational (pregnancy-induced) hypertension without significant proteinuria, unspecified trimester: O13.9

## 2012-05-22 HISTORY — DX: Major depressive disorder, single episode, unspecified: F32.9

## 2012-05-22 LAB — URINALYSIS, ROUTINE W REFLEX MICROSCOPIC
Nitrite: NEGATIVE
Specific Gravity, Urine: >1.03 — ABNORMAL HIGH (ref 1.005–1.030)
Urobilinogen, UA: 0.2 mg/dL (ref 0.0–1.0)

## 2012-05-22 LAB — RAPID URINE DRUG SCREEN, HOSP PERFORMED
Barbiturates: NOT DETECTED
Tetrahydrocannabinol: NOT DETECTED

## 2012-05-22 LAB — WET PREP, GENITAL
Trich, Wet Prep: NONE SEEN
Yeast Wet Prep HPF POC: NONE SEEN

## 2012-05-22 LAB — CBC WITH DIFFERENTIAL/PLATELET
Basophils Absolute: 0 10*3/uL (ref 0.0–0.1)
Basophils Relative: 0 % (ref 0–1)
Eosinophils Relative: 1 % (ref 0–5)
Lymphocytes Relative: 15 % (ref 12–46)
MCHC: 32.5 g/dL (ref 30.0–36.0)
MCV: 87.7 fL (ref 78.0–100.0)
Neutro Abs: 7.8 10*3/uL — ABNORMAL HIGH (ref 1.7–7.7)
Platelets: 158 10*3/uL (ref 150–400)
RDW: 12.8 % (ref 11.5–15.5)
WBC: 10.2 10*3/uL (ref 4.0–10.5)

## 2012-05-22 LAB — URINE MICROSCOPIC-ADD ON

## 2012-05-22 LAB — MAGNESIUM: Magnesium: 4.9 mg/dL — ABNORMAL HIGH (ref 1.5–2.5)

## 2012-05-22 LAB — RUBELLA SCREEN: Rubella: 54 IU/mL

## 2012-05-22 LAB — RPR: RPR Ser Ql: NONREACTIVE

## 2012-05-22 LAB — PREPARE RBC (CROSSMATCH)

## 2012-05-22 LAB — RAPID HIV SCREEN (WH-MAU): Rapid HIV Screen: NONREACTIVE

## 2012-05-22 MED ORDER — OXYCODONE-ACETAMINOPHEN 5-325 MG PO TABS
2.0000 | ORAL_TABLET | ORAL | Status: DC | PRN
  Administered 2012-05-22 – 2012-05-23 (×2): 2 via ORAL
  Filled 2012-05-22 (×2): qty 2
  Filled 2012-05-22: qty 1

## 2012-05-22 MED ORDER — DOCUSATE SODIUM 100 MG PO CAPS
100.0000 mg | ORAL_CAPSULE | Freq: Every day | ORAL | Status: DC
  Administered 2012-05-22: 100 mg via ORAL
  Filled 2012-05-22 (×2): qty 1

## 2012-05-22 MED ORDER — ACETAMINOPHEN 325 MG PO TABS: 650.0000 mg | ORAL_TABLET | ORAL | Status: DC | PRN

## 2012-05-22 MED ORDER — ZOLPIDEM TARTRATE 5 MG PO TABS: 10.0000 mg | ORAL_TABLET | Freq: Once | ORAL | Status: DC

## 2012-05-22 MED ORDER — ONDANSETRON 4 MG PO TBDP
4.0000 mg | ORAL_TABLET | Freq: Four times a day (QID) | ORAL | Status: DC | PRN
  Administered 2012-05-23 (×2): 4 mg via ORAL
  Filled 2012-05-22 (×2): qty 1

## 2012-05-22 MED ORDER — ONDANSETRON 8 MG/NS 50 ML IVPB
8.0000 mg | Freq: Four times a day (QID) | INTRAVENOUS | Status: DC | PRN
  Administered 2012-05-22 – 2012-05-24 (×3): 8 mg via INTRAVENOUS
  Filled 2012-05-22 (×3): qty 8

## 2012-05-22 MED ORDER — TERBUTALINE SULFATE 1 MG/ML IJ SOLN
0.2500 mg | Freq: Once | INTRAMUSCULAR | Status: AC
  Administered 2012-05-22: 0.25 mg via SUBCUTANEOUS
  Filled 2012-05-22: qty 1

## 2012-05-22 MED ORDER — MAGNESIUM SULFATE BOLUS VIA INFUSION
4.0000 g | Freq: Once | INTRAVENOUS | Status: AC
  Administered 2012-05-22 (×2): 4 g via INTRAVENOUS
  Filled 2012-05-22: qty 500

## 2012-05-22 MED ORDER — DEXTROSE 5 % IV SOLN
1.0000 g | Freq: Once | INTRAVENOUS | Status: AC
  Administered 2012-05-22: 1 g via INTRAVENOUS
  Filled 2012-05-22: qty 10

## 2012-05-22 MED ORDER — LACTATED RINGERS IV BOLUS (SEPSIS)
1000.0000 mL | Freq: Once | INTRAVENOUS | Status: DC
  Administered 2012-05-22: 1000 mL via INTRAVENOUS

## 2012-05-22 MED ORDER — SODIUM CHLORIDE 0.9 % IV SOLN
INTRAVENOUS | Status: DC
  Administered 2012-05-22 – 2012-05-23 (×4): via INTRAVENOUS

## 2012-05-22 MED ORDER — DEXTROSE 5 % IV SOLN
1.0000 g | Freq: Two times a day (BID) | INTRAVENOUS | Status: DC
  Administered 2012-05-22 – 2012-05-23 (×3): 1 g via INTRAVENOUS
  Filled 2012-05-22 (×4): qty 10

## 2012-05-22 MED ORDER — SODIUM CHLORIDE 0.9 % IV SOLN: INTRAVENOUS | Status: DC

## 2012-05-22 MED ORDER — OXYCODONE-ACETAMINOPHEN 5-325 MG PO TABS
1.0000 | ORAL_TABLET | Freq: Once | ORAL | Status: AC
  Administered 2012-05-22: 1 via ORAL
  Filled 2012-05-22: qty 1

## 2012-05-22 MED ORDER — MAGNESIUM SULFATE 40 G IN LACTATED RINGERS - SIMPLE
2.0000 g/h | INTRAVENOUS | Status: DC
  Administered 2012-05-22: 2 g/h via INTRAVENOUS
  Filled 2012-05-22: qty 500

## 2012-05-22 MED ORDER — SODIUM CHLORIDE 0.9 % IV SOLN
INTRAVENOUS | Status: DC
  Administered 2012-05-22: 08:00:00 via INTRAVENOUS

## 2012-05-22 MED ORDER — PRENATAL MULTIVITAMIN CH
1.0000 | ORAL_TABLET | Freq: Every day | ORAL | Status: DC
  Administered 2012-05-22: 1 via ORAL
  Filled 2012-05-22 (×2): qty 1

## 2012-05-22 MED ORDER — LACTATED RINGERS IV SOLN
INTRAVENOUS | Status: DC
  Administered 2012-05-22: 12:00:00 via INTRAVENOUS

## 2012-05-22 MED ORDER — ZOLPIDEM TARTRATE 5 MG PO TABS
5.0000 mg | ORAL_TABLET | Freq: Every evening | ORAL | Status: DC | PRN
  Administered 2012-05-23: 5 mg via ORAL
  Filled 2012-05-22 (×2): qty 1

## 2012-05-22 MED ORDER — CALCIUM CARBONATE ANTACID 500 MG PO CHEW
2.0000 | CHEWABLE_TABLET | ORAL | Status: DC | PRN
  Filled 2012-05-22: qty 2

## 2012-05-22 NOTE — MAU Note (Signed)
As pt calmed down, slowed breathing with encouragement, chest discomfort eased off.

## 2012-05-22 NOTE — MAU Note (Addendum)
Can' take the pain, it is so bad that she couldn't enjoy her breakfast.  Bilateral CVA tenderness noted.

## 2012-05-22 NOTE — MAU Provider Note (Signed)
History     CSN: [id number]  Arrival date and time: 05/22/12 0607   First Provider Initiated Contact with Patient 05/22/12 [phone]      No chief complaint on file.  HPI Margaret Cain is a 26 y.o. female @ [redacted]w[redacted]d gestation who presents to MAU with abdominal pain. The pain started at 5am. Patient states she was walking down [facility] and began having the pain so called EMS. By LMP the patient is [redacted] weeks gestation but by ultrasound patient is 27.[redacted] weeks gestation with EDD of 11/3. Last sexual intercourse in July.   OB History    Grav Para Term Preterm Abortions TAB SAB Ect Mult Living   4 2 2  0 1 0 1 0 0 2      Past Medical History  Diagnosis Date  . Anemia   . Headache   . Urinary tract infection     Past Surgical History  Procedure Date  . No past surgeries     Family History  Problem Relation Age of Onset  . Anesthesia problems Neg Hx   . Hypotension Neg Hx   . Malignant hyperthermia Neg Hx   . Pseudochol deficiency Neg Hx     History  Substance Use Topics  . Smoking status: Never Smoker   . Smokeless tobacco: Never Used  . Alcohol Use: No    Allergies: No Known Allergies  Prescriptions prior to admission  Medication Sig Dispense Refill  . Prenatal Vit-Fe Fumarate-FA (PRENATAL MULTIVITAMIN) TABS Take 1 tablet by mouth daily.        Review of Systems  Constitutional: Positive for chills. Negative for fever and weight loss.  HENT: Negative for ear pain, nosebleeds, congestion, sore throat and neck pain.   Eyes: Negative for blurred vision, double vision, photophobia and pain.  Respiratory: Positive for cough. Negative for shortness of breath and wheezing.   Cardiovascular: Negative for chest pain, palpitations and leg swelling.  Gastrointestinal: Positive for heartburn, nausea and abdominal pain. Negative for vomiting, diarrhea and constipation.  Genitourinary: Positive for frequency. Negative for dysuria and urgency.  Musculoskeletal: Positive for back  pain. Negative for myalgias.  Skin: Negative for itching and rash.  Neurological: Negative for dizziness, sensory change, speech change, seizures, weakness and headaches.  Endo/Heme/Allergies: Does not bruise/bleed easily.  Psychiatric/Behavioral: Negative for depression. The patient is not nervous/anxious.    Physical Exam   Blood pressure 100/60, pulse 96, temperature 97.9 F (36.6 C), temperature source Oral, resp. rate 20, height 5\' 1"  (1.549 m), weight 104 lb (47.174 kg), last menstrual period 12/13/2011.  Physical Exam  Nursing note and vitals reviewed. Constitutional: She is oriented to person, place, and time. She appears well-developed and well-nourished. No distress.  HENT:  Head: Normocephalic and atraumatic.  Eyes: EOM are normal.  Neck: Neck supple.  Cardiovascular: Normal rate.   Respiratory: Effort normal.  GI: Soft. There is no rebound and no guarding.       Gravid, contractions palpated  Genitourinary:       External genitalia without lesions. Cervix closed, thick, high. Uterus approximately 26 week size.  Musculoskeletal: Normal range of motion.  Neurological: She is alert and oriented to person, place, and time.  Skin: Skin is warm and dry.  Psychiatric: She has a normal mood and affect. Her behavior is normal. Judgment and thought content normal.  EFM:    Results for orders placed during the hospital encounter of 05/22/12 (from the past 24 hour(s))  URINALYSIS, ROUTINE W REFLEX MICROSCOPIC  Status: Abnormal   Collection Time   05/22/12  6:10 AM      Component Value Range   Color, Urine YELLOW  YELLOW   APPearance CLOUDY (*) CLEAR   Specific Gravity, Urine >1.030 (*) 1.005 - 1.030   pH 6.0  5.0 - 8.0   Glucose, UA NEGATIVE  NEGATIVE mg/dL   Hgb urine dipstick MODERATE (*) NEGATIVE   Bilirubin Urine NEGATIVE  NEGATIVE   Ketones, ur NEGATIVE  NEGATIVE mg/dL   Protein, ur NEGATIVE  NEGATIVE mg/dL   Urobilinogen, UA 0.2  0.0 - 1.0 mg/dL   Nitrite NEGATIVE   NEGATIVE   Leukocytes, UA LARGE (*) NEGATIVE  URINE MICROSCOPIC-ADD ON     Status: Abnormal   Collection Time   05/22/12  6:10 AM      Component Value Range   Squamous Epithelial / LPF MANY (*) RARE   WBC, UA 11-20  <3 WBC/hpf   RBC / HPF 3-6  <3 RBC/hpf   Bacteria, UA FEW (*) RARE   EFM: baseline 130, reactive, contracting every 5 to 7 minutes initially  Assessment: 26 y.o. female @ 27+ weeks gestation with abdominal pain   Contractions   UTI  Plan:  IV NS bolus   Terbutaline 0.25 mg SQ   GC, Chlamydia cultures sent   UDS   Continue to monitor  MAU Course: @ 07:30 am discussed with Dr. Gaynell Face and he agrees with plan of care.  Procedures After IV fluids and terb. Contractions have spaced out and patient has decreased pain.  Care turned over to Pamelia Hoit, NP @ 08:20 am Contractions ceased after terbutaline but then resumed with increase in flank pain Discussed with Dr. Gaynell Face- will admit Pt has not had prenatal labs- will order them as well as glucose screen Admission orders per Dr. Loletha Grayer, RN, FNP, [facility] 05/22/2012, 7:19 AM

## 2012-05-22 NOTE — MAU Note (Signed)
When returned to bed after being up, pt called out c/o pain in her chest,  Feels like can't catch breath, burning up into throat.  Lungs clear, resp unlabored. Pt able to converse.  o2 sat 100%

## 2012-05-22 NOTE — MAU Note (Signed)
Continues to complain of back pain, doesn't like the way the medicine made her feel.  Explained the shot will wear off, the back pain will continue until we get her UTI under control,  Cries out at time "back is hurting", abd was soft at that time.

## 2012-05-22 NOTE — MAU Note (Signed)
Was out walking "getting some fresh air", started cramping, called EMS.

## 2012-05-22 NOTE — MAU Note (Signed)
PT ARRIVED VIA EMS-      THIS AM AT 055-  SHE WAS WALKING DOWN [address]   GOT TO  [address]- THEN ABD STARTED HURTING BAD-  THEN SHE CALLED EMS. Marland Kitchen  GETS PNC WITH DR MARSHALL- LAST SEEN ON 7-17.   NEXT APPOINTMENT IS 8-14.  DENIES HSV AND MRSA.

## 2012-05-22 NOTE — H&P (Signed)
This is Dr. Francoise Ceo dictating the history and physical on Margaret Cain she's a 26 year old gravida 4 para 2 to was [redacted] weeks pregnant she is due 08/18/2012 the patient states results walking this morning when she developed severe low abdominal and back pain on admission she was contracting cervix is closed and her urine showed a urinary tract infection day 2 exam at that time it was stated is having bilateral CVA tenderness but on observation assassin on the patient's station does not have CVA tenderness she has low-back pain she is on magnesium sulfate 4 g loading and 2 g now and is not having any contractions she is on Rocephin 1 g IV every 12 hours for urinary tract infection Past medical history negative Past surgical history negative Social history negative system System review noncontributory Physical exam revealed a him a him he stated looking female in no distress HEENT negative Lungs clear to P&A Heart regular rhythm no murmurs no gallops Abdomen 628 week size Pelvic cervix long closed Extremities negative Admitting diagnosis UTI and premature contractions

## 2012-05-22 NOTE — MAU Note (Signed)
Called pharmacy regarding antibiotic  

## 2012-05-22 NOTE — Progress Notes (Signed)
Pt c/o feeling dizzy and nauseated. Pt also c/o decreased fetal movement. Pt assisted to bathroom to void. Pt got back in bed and vomited. Will call Dr Clearance Coots to request blood Magnesium level.

## 2012-05-22 NOTE — MAU Note (Signed)
Plan to admit pt discussed. Pt making arrangements for children

## 2012-05-23 ENCOUNTER — Ambulatory Visit (HOSPITAL_COMMUNITY): Payer: Medicaid Other

## 2012-05-23 ENCOUNTER — Encounter (HOSPITAL_COMMUNITY): Payer: Medicaid Other

## 2012-05-23 MED ORDER — POLYETHYLENE GLYCOL 3350 17 G PO PACK
17.0000 g | PACK | Freq: Every day | ORAL | Status: DC | PRN
  Administered 2012-05-23: 17 g via ORAL
  Filled 2012-05-23: qty 1

## 2012-05-23 NOTE — Progress Notes (Signed)
Patient ID: Margaret Cain, female   DOB: 01-Jun-1986, 26 y.o.   MRN: [id number] Vital signs normal Patient had some nausea vomiting weakness this morning so magnesium sulfate was discontinued and she has been normal back pain should become to note on her antibiotics IV for the next couple days and discharged

## 2012-05-23 NOTE — Progress Notes (Signed)
Pt would not let rn examine her, so I requested pt wear a pad to see if anything collects. Nothing noted on her gown or bed lines at this time. Pt doesn't want to wear underwear or a pad. Pt seems mad at RN for not checking her cervix. Reassured pt that she is not contracting so its unlikely her cervix has changed. MD might check her tomorrow before d/c home. PT rolled over away from RN.

## 2012-05-23 NOTE — Progress Notes (Signed)
Pt called out wanting a nurse to check her. Asked pt why, reports that she is wet down there. Explained to pt that its normal to have some discharge and we don't just do sve without a reason. Pt very frustrated by this. Pt says she knows that her water is not broken, she is just wet and knows its not sweat. Asked pt if there is anything rn can do for her and she replied no.

## 2012-05-24 LAB — TYPE AND SCREEN: Unit division: 0

## 2012-05-24 LAB — URINE CULTURE: Special Requests: NORMAL

## 2012-05-24 NOTE — Discharge Summary (Signed)
Patient admitted on 87 because she was [facility] and she developed severe lower abdominal and back pain on examination she was [redacted] weeks pregnant and she had a UTI initially the admitting mid-level sentient CVA tenderness but by the middle of the day at her complaints is not CVA tenderness she had lower back pain she received betamethasone x2 on Rocephin 1 g IV every 12 hours and magnesium sulfate for the pain patient's now feels much better the pain is gone and she been discharged on ampicillin 500 by mouth every 6 hours to see me in a week

## 2012-06-05 NOTE — Progress Notes (Signed)
Ur chart review completed.  

## 2012-06-07 ENCOUNTER — Inpatient Hospital Stay (HOSPITAL_COMMUNITY)
Admission: AD | Admit: 2012-06-07 | Discharge: 2012-06-07 | Disposition: A | Discharge status: Home / Self Care | Payer: Medicaid Other | Source: Ambulatory Visit | Attending: Obstetrics | Admitting: Obstetrics

## 2012-06-07 ENCOUNTER — Encounter (HOSPITAL_COMMUNITY): Payer: Self-pay | Admitting: *Deleted

## 2012-06-07 DIAGNOSIS — O4700 False labor before 37 completed weeks of gestation, unspecified trimester: Secondary | ICD-10-CM | POA: Insufficient documentation

## 2012-06-07 DIAGNOSIS — O479 False labor, unspecified: Secondary | ICD-10-CM

## 2012-06-07 LAB — URINALYSIS, ROUTINE W REFLEX MICROSCOPIC
Bilirubin Urine: NEGATIVE
Glucose, UA: NEGATIVE mg/dL
Ketones, ur: NEGATIVE mg/dL
pH: 7 (ref 5.0–8.0)

## 2012-06-07 MED ORDER — GI COCKTAIL ~~LOC~~
30.0000 mL | Freq: Once | ORAL | Status: AC
  Administered 2012-06-07: 30 mL via ORAL
  Filled 2012-06-07: qty 30

## 2012-06-07 MED ORDER — LACTATED RINGERS IV BOLUS (SEPSIS)
1000.0000 mL | Freq: Once | INTRAVENOUS | Status: AC
  Administered 2012-06-07 (×2): 1000 mL via INTRAVENOUS

## 2012-06-07 MED ORDER — TERBUTALINE SULFATE 1 MG/ML IJ SOLN
INTRAMUSCULAR | Status: AC
  Filled 2012-06-07: qty 1

## 2012-06-07 MED ORDER — TERBUTALINE SULFATE 1 MG/ML IJ SOLN: 0.2500 mg | Freq: Once | INTRAMUSCULAR | Status: DC

## 2012-06-07 NOTE — MAU Note (Signed)
Pt states she has been having contractions since 12 midnight-got worse after 0530-states has low back pain that comes and go-has had a headache since 12 midnight

## 2012-06-07 NOTE — MAU Note (Signed)
C/o sore throat and chest pain, when clarified- pt states is heartburn.... Complaints coincide with ctx.

## 2012-06-07 NOTE — MAU Provider Note (Signed)
Chief Complaint:  Labor Eval   First Provider Initiated Contact with Patient 06/07/12 [phone]      HPI: Margaret Cain is a 26 y.o. [id number] at 42w5dwho presents to maternity admissions reporting contractions and LBP. Contractions began midnight and got wore at 0500, about every 5 min.  Denies leakage of fluid or vaginal bleeding. Good fetal movement.   Pregnancy Course: She was admitted 05/22/12 for contractions and received MgSO4 and BMZ. .  Past Medical History: Past Medical History  Diagnosis Date  . Anemia   . Headache   . Urinary tract infection   . Pregnancy induced hypertension   . Depression     post partem    Past obstetric history: OB History    Grav Para Term Preterm Abortions TAB SAB Ect Mult Living   5 2 2  0 1 0 1 0 0 2     # Outc Date GA Lbr Len/2nd Wgt Sex Del Anes PTL Lv   1 SAB 12/12           2 CUR            3 TRM      SVD   Yes   4 TRM      SVD   Yes   5 GRA            Comments: System Generated. Please review and update pregnancy details.      Past Surgical History: Past Surgical History  Procedure Date  . No past surgeries     Family History: Family History  Problem Relation Age of Onset  . Anesthesia problems Neg Hx   . Hypotension Neg Hx   . Malignant hyperthermia Neg Hx   . Pseudochol deficiency Neg Hx   . Other Neg Hx     Social History: History  Substance Use Topics  . Smoking status: Never Smoker   . Smokeless tobacco: Never Used  . Alcohol Use: No    Allergies: No Known Allergies  Meds:  Prescriptions prior to admission  Medication Sig Dispense Refill  . Prenatal Vit-Fe Fumarate-FA (PRENATAL MULTIVITAMIN) TABS Take 1 tablet by mouth daily.        ROS: Pertinent findings in history of present illness.  Physical Exam  Blood pressure 103/58, pulse 89, temperature 98 F (36.7 C), temperature source Oral, resp. rate 20, height 5\' 1"  (1.549 m), weight 48.081 kg (106 lb), last menstrual period 12/13/2011, SpO2  100.00%. GENERAL: Well-developed, well-nourished female in no acute distress.  HEENT: normocephalic HEART: normal rate RESP: normal effort ABDOMEN: Soft, non-tender, gravid appropriate for gestational age BACK: no CVAT EXTREMITIES: Nontender, no edema NEURO: alert and oriented     FHT:  Baseline 125 , moderate variability, accelerations present, no decelerations Contractions: q 4-8 mins  Dilation: 1 Effacement (%): Thick Exam by:: D.Poe,CNM Cx long, PP high Stool in rectal vault  Labs: No results found for this or any previous visit (from the past 24 hour(s)).  Imaging:  No results found. ED Course fFN sent. LR bolus and terb .25mg  [place] given  Assessment: Preterm Contractions Positive FFN, s/p BMZ and Magnesium 8/7 & 8/8  Plan: C/W Dr. Gaynell Face Medication List  As of 06/07/2012  6:52 AM   ASK your doctor about these medications         prenatal multivitamin Tabs   Take 1 tablet by mouth daily.            Danae Orleans, CNM 06/07/2012 6:52 AM Care assumed  by Maylon Cos at 0800  10:18 AM Pt feeling better. Cervix unchanged from previous exam. No contractions on monitor. Will Discharge home. FU with Gaynell Face as scheduled next week.  Margaret Wisdom E.

## 2012-07-06 ENCOUNTER — Inpatient Hospital Stay (HOSPITAL_COMMUNITY)
Admission: AD | Admit: 2012-07-06 | Discharge: 2012-07-06 | Disposition: A | Discharge status: Home / Self Care | Payer: Medicaid Other | Source: Ambulatory Visit | Attending: Obstetrics | Admitting: Obstetrics

## 2012-07-06 ENCOUNTER — Encounter (HOSPITAL_COMMUNITY): Payer: Self-pay | Admitting: Obstetrics and Gynecology

## 2012-07-06 DIAGNOSIS — O4700 False labor before 37 completed weeks of gestation, unspecified trimester: Secondary | ICD-10-CM | POA: Insufficient documentation

## 2012-07-06 LAB — URINALYSIS, ROUTINE W REFLEX MICROSCOPIC
Glucose, UA: NEGATIVE mg/dL
Hgb urine dipstick: NEGATIVE
Specific Gravity, Urine: 1.01 (ref 1.005–1.030)
Urobilinogen, UA: 0.2 mg/dL (ref 0.0–1.0)

## 2012-07-06 LAB — WET PREP, GENITAL
Clue Cells Wet Prep HPF POC: NONE SEEN
Trich, Wet Prep: NONE SEEN

## 2012-07-06 MED ORDER — TERBUTALINE SULFATE 1 MG/ML IJ SOLN
0.2500 mg | Freq: Once | INTRAMUSCULAR | Status: AC
  Administered 2012-07-06: 0.25 mg via SUBCUTANEOUS
  Filled 2012-07-06: qty 1

## 2012-07-06 MED ORDER — NIFEDIPINE ER OSMOTIC RELEASE 60 MG PO TB24: 60.0000 mg | ORAL_TABLET | Freq: Every day | ORAL | Status: DC

## 2012-07-06 MED ORDER — LACTATED RINGERS IV BOLUS (SEPSIS)
1000.0000 mL | Freq: Once | INTRAVENOUS | Status: AC
  Administered 2012-07-06: 1000 mL via INTRAVENOUS

## 2012-07-06 NOTE — MAU Note (Signed)
Pt presents to MAU via EMS with complaints of contractions that started this morning at 0100. Pt is [redacted]w[redacted]d; G4P2- says the contractions have gotten worse throughout the night.

## 2012-07-06 NOTE — MAU Provider Note (Signed)
History     CSN: [id number]  Arrival date and time: 07/06/12 1037   First Provider Initiated Contact with Patient 07/06/12 1109      Chief Complaint  Patient presents with  . Contractions   HPI 26 y.o. [id number] at [redacted]w[redacted]d with contractions since last night, worsening, no bleeding, increased discharge since yesterday. Positive FFN on 8/23, cervix was 1 cm/thick at that time. Pt was given an rx for Procardia, but did not get it filled.    Past Medical History  Diagnosis Date  . Anemia   . Headache   . Urinary tract infection   . Pregnancy induced hypertension   . Depression     post partem    Past Surgical History  Procedure Date  . No past surgeries     Family History  Problem Relation Age of Onset  . Anesthesia problems Neg Hx   . Hypotension Neg Hx   . Malignant hyperthermia Neg Hx   . Pseudochol deficiency Neg Hx   . Other Neg Hx     History  Substance Use Topics  . Smoking status: Never Smoker   . Smokeless tobacco: Never Used  . Alcohol Use: No    Allergies: No Known Allergies  Prescriptions prior to admission  Medication Sig Dispense Refill  . Prenatal Vit-Fe Fumarate-FA (PRENATAL MULTIVITAMIN) TABS Take 1 tablet by mouth every morning.         Review of Systems  Constitutional: Negative.   Respiratory: Negative.   Cardiovascular: Negative.   Gastrointestinal: Negative for nausea, vomiting, abdominal pain, diarrhea and constipation.  Genitourinary: Negative for dysuria, urgency, frequency, hematuria and flank pain.       Negative for vaginal bleeding, Positive for cramping/contractions and discharge  Musculoskeletal: Negative.   Neurological: Positive for headaches.  Psychiatric/Behavioral: Negative.    Physical Exam   Blood pressure 103/55, pulse 75, temperature 97.3 F (36.3 C), temperature source Oral, resp. rate 18, last menstrual period 12/13/2011.  Physical Exam  Nursing note and vitals reviewed. Constitutional: She is oriented to  person, place, and time. She appears well-developed and well-nourished. No distress.  Cardiovascular: Normal rate.   Respiratory: Effort normal.  Genitourinary:       SVE: 3/25/-3   Musculoskeletal: Normal range of motion.  Neurological: She is alert and oriented to person, place, and time.  Skin: Skin is warm and dry.  Psychiatric: She has a normal mood and affect.   EFM reactive, TOCO: UC q 2-4 min MAU Course  Procedures Results for orders placed during the hospital encounter of 07/06/12 (from the past 24 hour(s))  FETAL FIBRONECTIN     Status: Abnormal   Collection Time   07/06/12 11:20 AM      Component Value Range   Fetal Fibronectin POSITIVE (*) NEGATIVE  WET PREP, GENITAL     Status: Abnormal   Collection Time   07/06/12 11:20 AM      Component Value Range   Yeast Wet Prep HPF POC NONE SEEN  NONE SEEN   Trich, Wet Prep NONE SEEN  NONE SEEN   Clue Cells Wet Prep HPF POC NONE SEEN  NONE SEEN   WBC, Wet Prep HPF POC MODERATE (*) NONE SEEN  URINALYSIS, ROUTINE W REFLEX MICROSCOPIC     Status: Normal   Collection Time   07/06/12 12:27 PM      Component Value Range   Color, Urine YELLOW  YELLOW   APPearance CLEAR  CLEAR   Specific Gravity, Urine 1.010  1.005 -  1.030   pH 7.5  5.0 - 8.0   Glucose, UA NEGATIVE  NEGATIVE mg/dL   Hgb urine dipstick NEGATIVE  NEGATIVE   Bilirubin Urine NEGATIVE  NEGATIVE   Ketones, ur NEGATIVE  NEGATIVE mg/dL   Protein, ur NEGATIVE  NEGATIVE mg/dL   Urobilinogen, UA 0.2  0.0 - 1.0 mg/dL   Nitrite NEGATIVE  NEGATIVE   Leukocytes, UA NEGATIVE  NEGATIVE  POCT FERN TEST     Status: Normal   Collection Time   07/06/12 12:39 PM      Component Value Range   Fern Test Negative        . lactated ringers  1,000 mL Intravenous Once  . terbutaline  0.25 mg Subcutaneous Once   Contractions ceased following terbutaline.   Assessment and Plan  26 y.o. [id number] at [redacted]w[redacted]d 1. Threatened preterm labor      Medication List     As of 07/06/2012  2:23  PM    START taking these medications         NIFEdipine 60 MG 24 hr tablet   Commonly known as: PROCARDIA XL/ADALAT-CC   Take 1 tablet (60 mg total) by mouth daily.      CONTINUE taking these medications         prenatal multivitamin Tabs          Where to get your medications    These are the prescriptions that you need to pick up. We sent them to a specific pharmacy, so you will need to go there to get them.   [facility] [address] [place], [place] - [address]    [address] [place] [address]    Phone: [phone]        NIFEdipine 60 MG 24 hr tablet           Encouraged rest, hydration, take medication as prescribed. Follow up in office as scheduled or sooner PRN.   Suzanne Garbers 07/06/2012, 11:11 AM

## 2012-07-08 LAB — GC/CHLAMYDIA PROBE AMP, GENITAL
Chlamydia, DNA Probe: NEGATIVE
GC Probe Amp, Genital: NEGATIVE

## 2012-07-17 ENCOUNTER — Encounter (HOSPITAL_COMMUNITY): Payer: Self-pay | Admitting: Anesthesiology

## 2012-07-17 ENCOUNTER — Encounter (HOSPITAL_COMMUNITY): Payer: Self-pay

## 2012-07-17 ENCOUNTER — Inpatient Hospital Stay (HOSPITAL_COMMUNITY)
Admission: AD | Admit: 2012-07-17 | Discharge: 2012-07-19 | DRG: 775 | Disposition: A | Discharge status: Home / Self Care | Payer: Medicaid Other | Source: Ambulatory Visit | Attending: Obstetrics | Admitting: Obstetrics

## 2012-07-17 ENCOUNTER — Inpatient Hospital Stay (HOSPITAL_COMMUNITY): Payer: Medicaid Other | Admitting: Anesthesiology

## 2012-07-17 DIAGNOSIS — Z22330 Carrier of Group B streptococcus: Secondary | ICD-10-CM

## 2012-07-17 DIAGNOSIS — O021 Missed abortion: Secondary | ICD-10-CM

## 2012-07-17 DIAGNOSIS — O99892 Other specified diseases and conditions complicating childbirth: Principal | ICD-10-CM | POA: Diagnosis present

## 2012-07-17 HISTORY — DX: Anxiety disorder, unspecified: F41.9

## 2012-07-17 LAB — CBC
HCT: 31.2 % — ABNORMAL LOW (ref 36.0–46.0)
MCHC: 32.7 g/dL (ref 30.0–36.0)
RDW: 12.7 % (ref 11.5–15.5)

## 2012-07-17 MED ORDER — LACTATED RINGERS IV SOLN: 500.0000 mL | Freq: Once | INTRAVENOUS | Status: DC

## 2012-07-17 MED ORDER — OXYTOCIN BOLUS FROM INFUSION
500.0000 mL | Freq: Once | INTRAVENOUS | Status: DC
  Filled 2012-07-17: qty 500

## 2012-07-17 MED ORDER — TETANUS-DIPHTH-ACELL PERTUSSIS 5-2.5-18.5 LF-MCG/0.5 IM SUSP
0.5000 mL | Freq: Once | INTRAMUSCULAR | Status: AC
  Administered 2012-07-18: 0.5 mL via INTRAMUSCULAR

## 2012-07-17 MED ORDER — EPHEDRINE 5 MG/ML INJ: 10.0000 mg | INTRAVENOUS | Status: DC | PRN

## 2012-07-17 MED ORDER — FERROUS SULFATE 325 (65 FE) MG PO TABS
325.0000 mg | ORAL_TABLET | Freq: Two times a day (BID) | ORAL | Status: DC
  Administered 2012-07-18 – 2012-07-19 (×3): 325 mg via ORAL
  Filled 2012-07-17 (×3): qty 1

## 2012-07-17 MED ORDER — FLEET ENEMA 7-19 GM/118ML RE ENEM: 1.0000 | ENEMA | RECTAL | Status: DC | PRN

## 2012-07-17 MED ORDER — PHENYLEPHRINE 40 MCG/ML (10ML) SYRINGE FOR IV PUSH (FOR BLOOD PRESSURE SUPPORT)
80.0000 ug | PREFILLED_SYRINGE | INTRAVENOUS | Status: DC | PRN
  Filled 2012-07-17: qty 5

## 2012-07-17 MED ORDER — LACTATED RINGERS IV SOLN: 500.0000 mL | INTRAVENOUS | Status: DC | PRN

## 2012-07-17 MED ORDER — TERBUTALINE SULFATE 1 MG/ML IJ SOLN: 0.2500 mg | Freq: Once | INTRAMUSCULAR | Status: DC | PRN

## 2012-07-17 MED ORDER — OXYCODONE-ACETAMINOPHEN 5-325 MG PO TABS
1.0000 | ORAL_TABLET | ORAL | Status: DC | PRN
  Administered 2012-07-19: 1 via ORAL
  Filled 2012-07-17: qty 1

## 2012-07-17 MED ORDER — ONDANSETRON HCL 4 MG/2ML IJ SOLN
4.0000 mg | Freq: Four times a day (QID) | INTRAMUSCULAR | Status: DC | PRN
  Administered 2012-07-17: 4 mg via INTRAVENOUS
  Filled 2012-07-17: qty 2

## 2012-07-17 MED ORDER — CITRIC ACID-SODIUM CITRATE 334-500 MG/5ML PO SOLN: 30.0000 mL | ORAL | Status: DC | PRN

## 2012-07-17 MED ORDER — OXYTOCIN 40 UNITS IN LACTATED RINGERS INFUSION - SIMPLE MED
1.0000 m[IU]/min | INTRAVENOUS | Status: DC
  Administered 2012-07-17: 1 m[IU]/min via INTRAVENOUS
  Filled 2012-07-17: qty 1000

## 2012-07-17 MED ORDER — FENTANYL 2.5 MCG/ML BUPIVACAINE 1/10 % EPIDURAL INFUSION (WH - ANES)
INTRAMUSCULAR | Status: DC | PRN
  Administered 2012-07-17: 12 mL/h via EPIDURAL

## 2012-07-17 MED ORDER — DIPHENHYDRAMINE HCL 50 MG/ML IJ SOLN: 12.5000 mg | INTRAMUSCULAR | Status: DC | PRN

## 2012-07-17 MED ORDER — ACETAMINOPHEN 325 MG PO TABS: 650.0000 mg | ORAL_TABLET | ORAL | Status: DC | PRN

## 2012-07-17 MED ORDER — ZOLPIDEM TARTRATE 5 MG PO TABS: 5.0000 mg | ORAL_TABLET | Freq: Every evening | ORAL | Status: DC | PRN

## 2012-07-17 MED ORDER — PRENATAL MULTIVITAMIN CH
1.0000 | ORAL_TABLET | Freq: Every day | ORAL | Status: DC
  Administered 2012-07-18 – 2012-07-19 (×2): 1 via ORAL
  Filled 2012-07-17 (×3): qty 1

## 2012-07-17 MED ORDER — OXYCODONE-ACETAMINOPHEN 5-325 MG PO TABS
1.0000 | ORAL_TABLET | ORAL | Status: DC | PRN
  Administered 2012-07-17: 1 via ORAL
  Administered 2012-07-18: 2 via ORAL
  Administered 2012-07-19: 1 via ORAL
  Filled 2012-07-17: qty 2
  Filled 2012-07-17 (×4): qty 1

## 2012-07-17 MED ORDER — IBUPROFEN 600 MG PO TABS
600.0000 mg | ORAL_TABLET | Freq: Four times a day (QID) | ORAL | Status: DC | PRN
  Administered 2012-07-18: 600 mg via ORAL
  Filled 2012-07-17 (×3): qty 1

## 2012-07-17 MED ORDER — INFLUENZA VIRUS VACC SPLIT PF IM SUSP
0.5000 mL | INTRAMUSCULAR | Status: AC
  Administered 2012-07-18: 0.5 mL via INTRAMUSCULAR
  Filled 2012-07-17: qty 0.5

## 2012-07-17 MED ORDER — SIMETHICONE 80 MG PO CHEW: 80.0000 mg | CHEWABLE_TABLET | ORAL | Status: DC | PRN

## 2012-07-17 MED ORDER — OXYTOCIN 40 UNITS IN LACTATED RINGERS INFUSION - SIMPLE MED: 62.5000 mL/h | Freq: Once | INTRAVENOUS | Status: DC

## 2012-07-17 MED ORDER — BENZOCAINE-MENTHOL 20-0.5 % EX AERO: 1.0000 "application " | INHALATION_SPRAY | CUTANEOUS | Status: DC | PRN

## 2012-07-17 MED ORDER — IBUPROFEN 600 MG PO TABS
600.0000 mg | ORAL_TABLET | Freq: Four times a day (QID) | ORAL | Status: DC
  Administered 2012-07-17 – 2012-07-19 (×5): 600 mg via ORAL
  Filled 2012-07-17 (×3): qty 1

## 2012-07-17 MED ORDER — ONDANSETRON HCL 4 MG/2ML IJ SOLN: 4.0000 mg | INTRAMUSCULAR | Status: DC | PRN

## 2012-07-17 MED ORDER — WITCH HAZEL-GLYCERIN EX PADS: 1.0000 "application " | MEDICATED_PAD | CUTANEOUS | Status: DC | PRN

## 2012-07-17 MED ORDER — FENTANYL 2.5 MCG/ML BUPIVACAINE 1/10 % EPIDURAL INFUSION (WH - ANES)
14.0000 mL/h | INTRAMUSCULAR | Status: DC
Start: 2012-07-17 — End: 2012-07-17
  Filled 2012-07-17: qty 123

## 2012-07-17 MED ORDER — ONDANSETRON HCL 4 MG PO TABS: 4.0000 mg | ORAL_TABLET | ORAL | Status: DC | PRN

## 2012-07-17 MED ORDER — DIBUCAINE 1 % RE OINT: 1.0000 "application " | TOPICAL_OINTMENT | RECTAL | Status: DC | PRN

## 2012-07-17 MED ORDER — BUTORPHANOL TARTRATE 1 MG/ML IJ SOLN: 1.0000 mg | INTRAMUSCULAR | Status: DC | PRN

## 2012-07-17 MED ORDER — LANOLIN HYDROUS EX OINT: TOPICAL_OINTMENT | CUTANEOUS | Status: DC | PRN

## 2012-07-17 MED ORDER — PENICILLIN G POTASSIUM 5000000 UNITS IJ SOLR
2.5000 10*6.[IU] | INTRAVENOUS | Status: DC
  Administered 2012-07-17: 2.5 10*6.[IU] via INTRAVENOUS
  Filled 2012-07-17 (×4): qty 2.5

## 2012-07-17 MED ORDER — SODIUM BICARBONATE 8.4 % IV SOLN
INTRAVENOUS | Status: DC | PRN
  Administered 2012-07-17: 4 mL via EPIDURAL

## 2012-07-17 MED ORDER — EPHEDRINE 5 MG/ML INJ
10.0000 mg | INTRAVENOUS | Status: DC | PRN
  Filled 2012-07-17: qty 4

## 2012-07-17 MED ORDER — LACTATED RINGERS IV SOLN
INTRAVENOUS | Status: DC
  Administered 2012-07-17 (×2): via INTRAVENOUS

## 2012-07-17 MED ORDER — SENNOSIDES-DOCUSATE SODIUM 8.6-50 MG PO TABS
2.0000 | ORAL_TABLET | Freq: Every day | ORAL | Status: DC
  Administered 2012-07-18: 2 via ORAL

## 2012-07-17 MED ORDER — LIDOCAINE HCL (PF) 1 % IJ SOLN
30.0000 mL | INTRAMUSCULAR | Status: DC | PRN
  Filled 2012-07-17: qty 30

## 2012-07-17 MED ORDER — PENICILLIN G POTASSIUM 5000000 UNITS IJ SOLR
5.0000 10*6.[IU] | Freq: Once | INTRAVENOUS | Status: AC
  Administered 2012-07-17: 5 10*6.[IU] via INTRAVENOUS
  Filled 2012-07-17: qty 5

## 2012-07-17 MED ORDER — DIPHENHYDRAMINE HCL 25 MG PO CAPS: 25.0000 mg | ORAL_CAPSULE | Freq: Four times a day (QID) | ORAL | Status: DC | PRN

## 2012-07-17 MED ORDER — PHENYLEPHRINE 40 MCG/ML (10ML) SYRINGE FOR IV PUSH (FOR BLOOD PRESSURE SUPPORT): 80.0000 ug | PREFILLED_SYRINGE | INTRAVENOUS | Status: DC | PRN

## 2012-07-17 NOTE — MAU Note (Signed)
Pt states saw amniotic fluid first around 2030, clear fluid. Then began having ctx's intermittently, noted increased fluid leaking around 0430, still clear fluid. Had to wait for son to get to school to come for evaluation. Was 2cm in office Monday.

## 2012-07-17 NOTE — MAU Note (Signed)
Dr. Gaynell Face notified pt in MAU for c/o PROM yesterday at 2030, clear fluid. Irregular contractions, rates pain 4/10 with u/c's. Fern +, GBS unknown, cervix 3.5/70/-2 vertex. Orders to admit

## 2012-07-17 NOTE — H&P (Signed)
This is Dr. Francoise Ceo dictating the history and physical on  Margaret Cain she's a 26 year old gravida 4 para 2012 at 35 weeks and 3 days EDC 08/18/2012 was membranes ruptured at 8:00 last night and she came to the hospital this morning with contracting irregularly and fluid clear she is now 4 cm 70% vertex -1-2 station positive GBS and she is on penicillin regiment and he is now on low-dose Pitocin Past medical history during this pregnancy she was hospitalized in August for pyelonephritis Past surgical history negative Social history negative System review noncontributory Physical exam revealed a well-developed female in labor HEENT negative  breasts negative Lungs clear to P&A Heart regular rhythm no murmurs no gallops Abdomen 36 week size Pelvic as described above Extremities negative

## 2012-07-17 NOTE — Anesthesia Procedure Notes (Signed)
Epidural Patient location during procedure: OB  Preanesthetic Checklist Completed: patient identified, site marked, surgical consent, pre-op evaluation, timeout performed, IV checked, risks and benefits discussed and monitors and equipment checked  Epidural Patient position: sitting Prep: site prepped and draped and DuraPrep Patient monitoring: continuous pulse ox and blood pressure Approach: midline Injection technique: LOR air  Needle:  Needle type: Tuohy  Needle gauge: 17 G Needle length: 9 cm and 9 Needle insertion depth: 5 cm cm Catheter type: closed end flexible Catheter size: 19 Gauge Catheter at skin depth: 10 cm Test dose: negative  Assessment Events: blood not aspirated, injection not painful, no injection resistance, negative IV test and no paresthesia  Additional Notes Dosing of Epidural:  1st dose, through catheter ............................................. epi 1:200K + Xylocaine 40 mg  2nd dose, through catheter, after waiting 3 minutes.....epi 1:200K + Xylocaine 40 mg   ( 2% Xylo charted as a single dose in Epic Meds for ease of charting; actual dosing was fractionated as above, for saftey's sake)  As each dose occurred, patient was free of IV sx; and patient exhibited no evidence of SA injection.  Patient is more comfortable after epidural dosed. Please see RN's note for documentation of vital signs,and FHR which are stable.  Patient reminded not to try to ambulate with numb legs, and that an RN must be present the 1st time she attempts to get up.    

## 2012-07-17 NOTE — Anesthesia Preprocedure Evaluation (Addendum)
Anesthesia Evaluation  Patient identified by MRN, date of birth, ID band Patient awake    Reviewed: Allergy & Precautions, H&P , Patient's Chart, lab work & pertinent test results  Airway Mallampati: II TM Distance: >3 FB Neck ROM: full    Dental  (+) Teeth Intact   Pulmonary  breath sounds clear to auscultation        Cardiovascular Rhythm:regular Rate:Normal     Neuro/Psych    GI/Hepatic   Endo/Other    Renal/GU      Musculoskeletal   Abdominal   Peds  Hematology   Anesthesia Other Findings   PIH in past, none now.Preterm Rx with Nifedipine    Reproductive/Obstetrics (+) Pregnancy                          Anesthesia Physical Anesthesia Plan  ASA: III  Anesthesia Plan: Epidural   Post-op Pain Management:    Induction:   Airway Management Planned:   Additional Equipment:   Intra-op Plan:   Post-operative Plan:   Informed Consent: I have reviewed the patients History and Physical, chart, labs and discussed the procedure including the risks, benefits and alternatives for the proposed anesthesia with the patient or authorized representative who has indicated his/her understanding and acceptance.   Dental Advisory Given  Plan Discussed with:   Anesthesia Plan Comments: (Labs checked- platelets confirmed with RN in room. Fetal heart tracing, per RN, reported to be stable enough for sitting procedure. Discussed epidural, and patient consents to the procedure:  included risk of possible headache,backache, failed block, allergic reaction, and nerve injury. This patient was asked if she had any questions or concerns before the procedure started. )        Anesthesia Quick Evaluation

## 2012-07-18 LAB — CBC
Platelets: 135 10*3/uL — ABNORMAL LOW (ref 150–400)
RBC: 3.13 MIL/uL — ABNORMAL LOW (ref 3.87–5.11)
RDW: 12.9 % (ref 11.5–15.5)
WBC: 10.7 10*3/uL — ABNORMAL HIGH (ref 4.0–10.5)

## 2012-07-18 NOTE — Anesthesia Postprocedure Evaluation (Signed)
  Anesthesia Post-op Note  Patient: Margaret Cain  Procedure(s) Performed: * No procedures listed *  Patient Location: PACU and Mother/Baby  Anesthesia Type: Epidural  Level of Consciousness: awake, alert  and oriented  Airway and Oxygen Therapy: Patient Spontanous Breathing  Post-op Pain: mild  Post-op Assessment: Post-op Vital signs reviewed  Post-op Vital Signs: Reviewed and stable  Complications: No apparent anesthesia complications

## 2012-07-18 NOTE — Progress Notes (Signed)
UR Chart review completed.  

## 2012-07-18 NOTE — Progress Notes (Signed)
Patient ID: ITCEL BUTTRY, female   DOB: [birth year], 26 y.o.   MRN: [id number] Postpartum day one Vital signs normal Fundus firm Lochia moderate Legs negative no

## 2012-07-19 ENCOUNTER — Encounter (HOSPITAL_COMMUNITY): Payer: Self-pay | Admitting: *Deleted

## 2012-07-19 NOTE — Discharge Summary (Signed)
Obstetric Discharge Summary Reason for Admission: onset of labor Prenatal Procedures: none Intrapartum Procedures: spontaneous vaginal delivery Postpartum Procedures: none Complications-Operative and Postpartum: none Hemoglobin  Date Value Range Status  07/18/2012 9.0* 12.0 - 15.0 g/dL Final     HCT  Date Value Range Status  07/18/2012 27.2* 36.0 - 46.0 % Final    Physical Exam:  General: alert Lochia: appropriate Uterine Fundus: firm Incision: healing well DVT Evaluation: No evidence of DVT seen on physical exam.  Discharge Diagnoses: Term Pregnancy-delivered  Discharge Information: Date: 07/19/2012 Activity: pelvic rest Diet: routine Medications: Percocet Condition: stable Instructions: refer to practice specific booklet Discharge to: home Follow-up Information    Call in 6 weeks to follow up.   Contact information:   b [address] Nibert         Newborn Data: Live born female  Birth Weight: 4 lb 5.7 oz (1975 g) APGAR: 9, 9  Home with mother.  Samai Corea A 07/19/2012, 6:17 AM

## 2012-07-19 NOTE — Discharge Summary (Signed)
Obstetric Discharge Summary Reason for Admission: onset of labor Prenatal Procedures: none Intrapartum Procedures: spontaneous vaginal delivery Postpartum Procedures: none Complications-Operative and Postpartum: none Hemoglobin  Date Value Range Status  07/18/2012 9.0* 12.0 - 15.0 g/dL Final     HCT  Date Value Range Status  07/18/2012 27.2* 36.0 - 46.0 % Final    Physical Exam:  General: alert Lochia: appropriate Uterine Fundus: firm Incision: healing well DVT Evaluation: No evidence of DVT seen on physical exam.  Discharge Diagnoses: Premature labor  Discharge Information: Date: 07/19/2012 Activity: pelvic rest Diet: routine Medications: Percocet Condition: stable Instructions: refer to practice specific booklet Discharge to: home Follow-up Information    Call in 6 weeks to follow up.   Contact information:   b Arnell Mausolf         Newborn Data: Live born female  Birth Weight: 4 lb 5.7 oz (1975 g) APGAR: 9, 9  Home with mother.  Kavya Haag A 07/19/2012, 6:19 AM

## 2012-07-20 NOTE — Clinical Social Work Note (Signed)
Clinical Social Work Department PSYCHOSOCIAL ASSESSMENT - MATERNAL/CHILD 07/20/2012  Patient:  DAVONNA, ERTL  Account Number:  [id number]  Admit Date:  07/17/2012  Marjo Bicker Name:   Alvia Grove    Clinical Social Worker:  Truman Hayward, LCSW   Date/Time:  07/20/2012 11:00 AM  Date Referred:  07/20/2012   Referral source  RN  Physician     Referred reason  Depression/Anxiety   Other referral source:    I:  FAMILY / HOME ENVIRONMENT Child's legal guardian:  PARENT  Guardian - Name Guardian - Age Guardian - Address  Madasyn Heath [address] [place], [place] [address]  Felipe Drone     Other household support members/support persons Name Relationship DOB  MOB's cousing (unable to get name)  25 yo  85 year old BROTHER   25 year old BROTHER    Other support:   MOB's mother works at [employer] and is supportive.  MOB reported her cousin that she lives with and other family who is supportive.    II  PSYCHOSOCIAL DATA Information Source:  Patient Interview  Event organiser Employment:   Financial resources:  [employer] If Medicaid - County:  [employer] Clinical biochemist  WIC   School / Grade:   Maternity Care Coordinator / Child Services Coordination / Early Interventions:  Cultural issues impacting care:    III  STRENGTHS Strengths  Supportive family/friends  Compliance with medical plan   Strength comment:    IV  RISK FACTORS AND CURRENT PROBLEMS Current Problem:  None   Risk Factor & Current Problem Patient Issue Family Issue Risk Factor / Current Problem Comment   N N     V  SOCIAL WORK ASSESSMENT CSW spoke with MOB in room.  MOB was observably tearful when CSW approached her.  CSW discussed sadness.  MOB reports dynamics with FOB and that he upset her.  MOB reports no safety concerns and expressed that her and FOB get along, but when they do not it makes her upset.  CSW discussed other family support.  MOB reported her cousin that she  lives with and other family.  MGM entered the room and also reported other family support.  CSW provided bundle pack to MOB due to her request for some resources. MOB indicated she needed help with a crib or bassinette, however with further conversation with MOB and her M, they decided family could help obtain that.  MOB does not report any concerns with medicaid and that she receives food stamps and WIC.  No concerns with lpnc or drug use.  CSW instructed MOB to let RN or CSW know if any further concerns arise.  CSW also provided feelings after birth brochure.  Please reconsult CSW if any further needs arise.      VI SOCIAL WORK PLAN Social Work Plan  No Further Intervention Required / No Barriers to Discharge   Type of pt/family education:   If child protective services report - county:   If child protective services report - date:   Information/referral to community resources comment:   feelings after birth brochure.   Other social work plan:

## 2012-10-16 NOTE — L&D Delivery Note (Signed)
Delivery Note At 10:11 AM a viable female was delivered via Vaginal, Spontaneous Delivery (Presentation: ; Occiput Anterior).  APGAR: 9, 9; weight .   Placenta status: Intact, Spontaneous.  Cord: 3 vessels with the following complications: None.  Cord pH: not done  Anesthesia: Epidural  Episiotomy: None Lacerations: None Suture Repair: 2.0 Est. Blood Loss (mL): 250  Mom to postpartum.  Baby to Couplet care / Skin to Skin.  [name] A 09/20/2013, 10:42 AM

## 2012-11-20 NOTE — Progress Notes (Signed)
Patient here today after administration of misoprostol in MAU for presumed failed pregnancy. Her BHCG values have been dropping, another one to be checked today. No bleeding or other concerns.  Will follow up lab and manage accordingly.   Total encounter time: 15 minutes

## 2013-03-26 ENCOUNTER — Emergency Department (HOSPITAL_COMMUNITY)
Admission: EM | Admit: 2013-03-26 | Discharge: 2013-03-26 | Disposition: A | Discharge status: Home / Self Care | Payer: Medicaid Other | Attending: Emergency Medicine | Admitting: Emergency Medicine

## 2013-03-26 ENCOUNTER — Encounter (HOSPITAL_COMMUNITY): Payer: Self-pay | Admitting: Emergency Medicine

## 2013-03-26 DIAGNOSIS — Z87440 Personal history of urinary (tract) infections: Secondary | ICD-10-CM | POA: Insufficient documentation

## 2013-03-26 DIAGNOSIS — S3981XA Other specified injuries of abdomen, initial encounter: Secondary | ICD-10-CM | POA: Insufficient documentation

## 2013-03-26 DIAGNOSIS — Z8742 Personal history of other diseases of the female genital tract: Secondary | ICD-10-CM | POA: Insufficient documentation

## 2013-03-26 DIAGNOSIS — IMO0002 Reserved for concepts with insufficient information to code with codable children: Secondary | ICD-10-CM | POA: Insufficient documentation

## 2013-03-26 DIAGNOSIS — Z8679 Personal history of other diseases of the circulatory system: Secondary | ICD-10-CM | POA: Insufficient documentation

## 2013-03-26 DIAGNOSIS — O99891 Other specified diseases and conditions complicating pregnancy: Secondary | ICD-10-CM | POA: Insufficient documentation

## 2013-03-26 DIAGNOSIS — Z8659 Personal history of other mental and behavioral disorders: Secondary | ICD-10-CM | POA: Insufficient documentation

## 2013-03-26 DIAGNOSIS — Z862 Personal history of diseases of the blood and blood-forming organs and certain disorders involving the immune mechanism: Secondary | ICD-10-CM | POA: Insufficient documentation

## 2013-03-26 DIAGNOSIS — M549 Dorsalgia, unspecified: Secondary | ICD-10-CM

## 2013-03-26 MED ORDER — OXYCODONE-ACETAMINOPHEN 5-325 MG PO TABS
2.0000 | ORAL_TABLET | Freq: Once | ORAL | Status: AC
  Administered 2013-03-26: 2 via ORAL
  Filled 2013-03-26: qty 2

## 2013-03-26 MED ORDER — HYDROCODONE-ACETAMINOPHEN 5-325 MG PO TABS: 2.0000 | ORAL_TABLET | ORAL | Status: DC | PRN
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
## 2013-04-02 LAB — OB RESULTS CONSOLE HEPATITIS B SURFACE ANTIGEN: Hepatitis B Surface Ag: NEGATIVE

## 2013-04-02 LAB — OB RESULTS CONSOLE HIV ANTIBODY (ROUTINE TESTING): HIV: NONREACTIVE

## 2013-04-02 LAB — OB RESULTS CONSOLE ABO/RH: RH Type: POSITIVE

## 2013-04-02 LAB — OB RESULTS CONSOLE RUBELLA ANTIBODY, IGM: Rubella: IMMUNE

## 2013-04-21 ENCOUNTER — Inpatient Hospital Stay (HOSPITAL_COMMUNITY)
Admission: AD | Admit: 2013-04-21 | Discharge: 2013-04-21 | Disposition: A | Discharge status: Home / Self Care | Payer: Medicaid Other | Source: Ambulatory Visit | Attending: Obstetrics | Admitting: Obstetrics

## 2013-04-21 ENCOUNTER — Encounter (HOSPITAL_COMMUNITY): Payer: Self-pay | Admitting: *Deleted

## 2013-04-21 DIAGNOSIS — L293 Anogenital pruritus, unspecified: Secondary | ICD-10-CM | POA: Insufficient documentation

## 2013-04-21 DIAGNOSIS — M545 Low back pain, unspecified: Secondary | ICD-10-CM | POA: Insufficient documentation

## 2013-04-21 DIAGNOSIS — O2342 Unspecified infection of urinary tract in pregnancy, second trimester: Secondary | ICD-10-CM

## 2013-04-21 DIAGNOSIS — O2390 Unspecified genitourinary tract infection in pregnancy, unspecified trimester: Secondary | ICD-10-CM | POA: Insufficient documentation

## 2013-04-21 DIAGNOSIS — N390 Urinary tract infection, site not specified: Secondary | ICD-10-CM | POA: Insufficient documentation

## 2013-04-21 DIAGNOSIS — B373 Candidiasis of vulva and vagina: Secondary | ICD-10-CM

## 2013-04-21 DIAGNOSIS — N949 Unspecified condition associated with female genital organs and menstrual cycle: Secondary | ICD-10-CM | POA: Insufficient documentation

## 2013-04-21 DIAGNOSIS — B3731 Acute candidiasis of vulva and vagina: Secondary | ICD-10-CM | POA: Insufficient documentation

## 2013-04-21 LAB — WET PREP, GENITAL
Trich, Wet Prep: NONE SEEN
Yeast Wet Prep HPF POC: NONE SEEN

## 2013-04-21 LAB — URINALYSIS, ROUTINE W REFLEX MICROSCOPIC
Hgb urine dipstick: NEGATIVE
Nitrite: NEGATIVE
Specific Gravity, Urine: 1.025 (ref 1.005–1.030)
Urobilinogen, UA: 1 mg/dL (ref 0.0–1.0)

## 2013-04-21 LAB — URINE MICROSCOPIC-ADD ON

## 2013-04-21 MED ORDER — CYCLOBENZAPRINE HCL 10 MG PO TABS
10.0000 mg | ORAL_TABLET | ORAL | Status: AC
  Administered 2013-04-21: 10 mg via ORAL
  Filled 2013-04-21: qty 1

## 2013-04-21 MED ORDER — FLUCONAZOLE 150 MG PO TABS
150.0000 mg | ORAL_TABLET | ORAL | Status: AC
  Administered 2013-04-21: 150 mg via ORAL
  Filled 2013-04-21: qty 1

## 2013-04-21 MED ORDER — FLUCONAZOLE 150 MG PO TABS: ORAL_TABLET | ORAL | Status: DC

## 2013-04-21 MED ORDER — MICONAZOLE NITRATE 2 % VA CREA: 1.0000 | TOPICAL_CREAM | Freq: Every day | VAGINAL | Status: DC

## 2013-04-21 MED ORDER — CEFTRIAXONE SODIUM 1 G IJ SOLR
1.0000 g | INTRAMUSCULAR | Status: AC
  Administered 2013-04-21: 1 g via INTRAMUSCULAR
  Filled 2013-04-21: qty 10

## 2013-04-21 MED ORDER — CEFTRIAXONE SODIUM 250 MG IJ SOLR: 250.0000 mg | INTRAMUSCULAR | Status: DC

## 2013-04-21 NOTE — MAU Note (Cosign Needed)
CC:   "I have yeast and back pain."    HPI: 27 yo [id number] on [redacted]w[redacted]d with history of GC, trichomonas, and fall back on 03/26/13 that presents with vaginal discharge and low back pain.  Reports dull, achy, non-radiating low back pain that started 3 days ago.  Has not taken anything for pain at this point because she states it makes her nauseous.  Vaginal discharge also started 3 days ago, very painful and itchy.  Monistat has not helped.  Reports she tested positive for GC 1 month ago in her prenatal visit and treated for it.  Denies vaginal bleeding, paresthesias, urinary frequency, dysuria, changes in bowel movements, urinary or fecal incontinence.    Reviewed record from last ED visit here, low suspicion of fracture and patient denied imaging.    Pt reports she cannot take antibiotic pills because they are too large and she throws them up if she crushes them.  She reports she has not finished courses of antibiotics prescribed to her in the past because of this.    Past Medical History  Diagnosis Date  . Anemia   . Headache(784.0)   . Urinary tract infection   . Pregnancy induced hypertension   . Anxiety   . Depression     post partem, fine now   Past Surgical History  Procedure Laterality Date  . No past surgeries     Family History  Problem Relation Age of Onset  . Anesthesia problems Neg Hx   . Hypotension Neg Hx   . Malignant hyperthermia Neg Hx   . Pseudochol deficiency Neg Hx   . Other Neg Hx   . Cancer Neg Hx   . Heart disease Neg Hx   . Stroke Neg Hx   . Hearing loss Neg Hx   . Asthma Son   . Asthma Paternal Aunt    History   Social History  . Marital Status: Single    Spouse Name: N/A    Number of Children: N/A  . Years of Education: N/A   Occupational History  . Not on file.   Social History Main Topics  . Smoking status: Never Smoker   . Smokeless tobacco: Never Used  . Alcohol Use: No  . Drug Use: No  . Sexually Active: Not on file     Comment: 1  partner   Other Topics Concern  . Not on file   Social History Narrative  . No narrative on file     ROS: As per HPI  Physical Exam: Filed Vitals:   04/21/13 1009  BP: 111/59  Pulse: 83  Temp: 98.1 F (36.7 C)  TempSrc: Oral  Resp: 16  Height: 4' 11.5" (1.511 m)  Weight: 45.813 kg (101 lb)   Constitutional:  Alert, under no acute distress  Cardio:  RRR Lungs:  CTAB Musculoskeletal:   Tender at spinous processes in lumbar region, paravertebral pain in lumbar region Abdominal:  Soft, mild generalized pain, mild CVA tenderness, no rebound tenderness, no muscular rigidity  Pelvic exam: Cervix pink, visually closed, without lesion, moderate amount white thick discharge with clumps, vaginal walls and external genitalia slightly erythemetous Bimanual exam: Cervix 0/long/high, firm, anterior, neg CMT, uterus nontender, nonenlarged, adnexa without tenderness, enlargement, or mass  Urinalysis Results for orders placed during the hospital encounter of 04/21/13 (from the past 24 hour(s))  URINALYSIS, ROUTINE W REFLEX MICROSCOPIC     Status: Abnormal   Collection Time    04/21/13 10:21 AM  Result Value Range   Color, Urine YELLOW  YELLOW   APPearance HAZY (*) CLEAR   Specific Gravity, Urine 1.025  1.005 - 1.030   pH 6.0  5.0 - 8.0   Glucose, UA NEGATIVE  NEGATIVE mg/dL   Hgb urine dipstick NEGATIVE  NEGATIVE   Bilirubin Urine NEGATIVE  NEGATIVE   Ketones, ur NEGATIVE  NEGATIVE mg/dL   Protein, ur NEGATIVE  NEGATIVE mg/dL   Urobilinogen, UA 1.0  0.0 - 1.0 mg/dL   Nitrite NEGATIVE  NEGATIVE   Leukocytes, UA MODERATE (*) NEGATIVE  URINE MICROSCOPIC-ADD ON     Status: Abnormal   Collection Time    04/21/13 10:21 AM      Result Value Range   Squamous Epithelial / LPF MANY (*) RARE   WBC, UA 0-2  <3 WBC/hpf   Bacteria, UA FEW (*) RARE   Urine-Other MUCOUS PRESENT    WET PREP, GENITAL     Status: Abnormal   Collection Time    04/21/13 12:41 PM      Result Value Range    Yeast Wet Prep HPF POC NONE SEEN  NONE SEEN   Trich, Wet Prep NONE SEEN  NONE SEEN   Clue Cells Wet Prep HPF POC FEW (*) NONE SEEN   WBC, Wet Prep HPF POC TOO NUMEROUS TO COUNT (*) NONE SEEN     Assessment/Plan: 27 yo [id number] at [redacted]w[redacted]d  1. Vaginal candida   2. UTI in pregnancy, antepartum, second trimester     Rocephin 1000 mg IM x1 dose for UTI since pt unable to take PO abx Diflucan 150 mg x1 dose now, 1 dose prescribed for 3 days from now Flexeril 10 mg PO x1 dose in MAU, 5 mg TID prescribed Monistat OTC for external itching F/U with Dr Gaynell Face if ongoing back pain, vaginal symptoms.  Needs TOC for GC. Return to MAU as needed  Sharen Counter Certified Nurse-Midwife

## 2013-04-21 NOTE — MAU Note (Addendum)
Been having yeast infection for past 3 days: itching, swelling, little bit of odor, little burnation and discharge.  Pain in mid back- started on the 3rd, starts in back comes to sides.
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
## 2013-05-09 ENCOUNTER — Inpatient Hospital Stay (HOSPITAL_COMMUNITY)
Admission: AD | Admit: 2013-05-09 | Discharge: 2013-05-09 | Disposition: A | Discharge status: Home / Self Care | Payer: Medicaid Other | Source: Ambulatory Visit | Attending: Obstetrics | Admitting: Obstetrics

## 2013-05-09 ENCOUNTER — Encounter (HOSPITAL_COMMUNITY): Payer: Self-pay | Admitting: Obstetrics and Gynecology

## 2013-05-09 DIAGNOSIS — O26859 Spotting complicating pregnancy, unspecified trimester: Secondary | ICD-10-CM | POA: Insufficient documentation

## 2013-05-09 DIAGNOSIS — O4692 Antepartum hemorrhage, unspecified, second trimester: Secondary | ICD-10-CM

## 2013-05-09 DIAGNOSIS — O4690 Antepartum hemorrhage, unspecified, unspecified trimester: Secondary | ICD-10-CM

## 2013-05-09 LAB — WET PREP, GENITAL
Clue Cells Wet Prep HPF POC: NONE SEEN
Trich, Wet Prep: NONE SEEN
Yeast Wet Prep HPF POC: NONE SEEN

## 2013-05-09 LAB — OB RESULTS CONSOLE GC/CHLAMYDIA
Chlamydia: NEGATIVE
Gonorrhea: NEGATIVE

## 2013-05-09 NOTE — MAU Note (Signed)
Patient presents to MAU via EMS with c/o blood-tinged mucous noted after wiping last night and this morning. Reports low-lying placenta with this pregnancy.  Denies antecedent sexual intercourse or trauma.

## 2013-05-09 NOTE — MAU Note (Signed)
Ms. Buescher presents to MAU via EMS with complaints of vagina spotting. She is [redacted]w[redacted]d; history of a preterm delivery (35w), and low lying placenta with this pregnancy. Complains of back pain 6/10

## 2013-05-09 NOTE — MAU Provider Note (Signed)
History     CSN: [id number]  Arrival date and time: 05/09/13 1025   First Provider Initiated Contact with Patient 05/09/13 1100      Chief Complaint  Patient presents with  . Vaginal Bleeding  . Back Pain   HPI [name] is a 27 y.o. [phone] at [redacted]w[redacted]d who presents to MAU today be EMS for vaginal spotting. The patient states that she noticed spotting last night when she wiped. She noticed this again this morning. The spotting is pink. She also had some moderate low back pain last night that she rated at 5-6/10 that resolved spontaneously. She has no pain now. She denies UTI symptoms. She notes a clear mucus discharge. She had [place] at Dr. Elsie Stain office last week that showed a low lying placenta per patient. She denies other complications of the pregnancy.   OB History   Grav Para Term Preterm Abortions TAB SAB Ect Mult Living   5 3 2 1 1  0 1 0 0 3      Past Medical History  Diagnosis Date  . Anemia   . Headache(784.0)   . Urinary tract infection   . Pregnancy induced hypertension   . Anxiety   . Depression     post partem, fine now    Past Surgical History  Procedure Laterality Date  . No past surgeries      Family History  Problem Relation Age of Onset  . Anesthesia problems Neg Hx   . Hypotension Neg Hx   . Malignant hyperthermia Neg Hx   . Pseudochol deficiency Neg Hx   . Other Neg Hx   . Cancer Neg Hx   . Heart disease Neg Hx   . Stroke Neg Hx   . Hearing loss Neg Hx   . Asthma Son   . Asthma Paternal Aunt     History  Substance Use Topics  . Smoking status: Never Smoker   . Smokeless tobacco: Never Used  . Alcohol Use: No    Allergies: No Known Allergies  Prescriptions prior to admission  Medication Sig Dispense Refill  . Prenatal Vit-Fe Fumarate-FA (PRENATAL MULTIVITAMIN) TABS Take 1 tablet by mouth daily at 12 noon.        Review of Systems  Constitutional: Negative for fever and malaise/fatigue.  Gastrointestinal: Negative for  nausea, vomiting, abdominal pain, diarrhea and constipation.  Genitourinary: Negative for dysuria, urgency and frequency.       + vaginal bleeding, discharge   Physical Exam   Blood pressure 106/47, pulse 81, temperature 98.1 F (36.7 C), temperature source Oral, resp. rate 16, height 4' 11.5" (1.511 m), weight 101 lb (45.813 kg), last menstrual period 12/13/2011, not currently breastfeeding.  Physical Exam  Constitutional: She is oriented to person, place, and time. She appears well-developed and well-nourished. No distress.  HENT:  Head: Normocephalic and atraumatic.  Cardiovascular: Normal rate, regular rhythm and normal heart sounds.   Respiratory: Effort normal and breath sounds normal. No respiratory distress.  GI: Soft. Bowel sounds are normal. She exhibits no distension. There is no tenderness. There is no rebound and no guarding.  Genitourinary: Uterus is enlarged (appropriate for GA). Uterus is not tender. Cervix exhibits friability. Cervix exhibits no motion tenderness and no discharge. There is bleeding (scant blood) around the vagina. Vaginal discharge (small amount of thin, pink discharge noted) found.  Neurological: She is alert and oriented to person, place, and time.  Skin: Skin is warm and dry. No erythema.  Psychiatric: She  has a normal mood and affect.   Results for orders placed during the hospital encounter of 05/09/13 (from the past 24 hour(s))  WET PREP, GENITAL     Status: Abnormal   Collection Time    05/09/13 11:08 AM      Result Value Range   Yeast Wet Prep HPF POC NONE SEEN  NONE SEEN   Trich, Wet Prep NONE SEEN  NONE SEEN   Clue Cells Wet Prep HPF POC NONE SEEN  NONE SEEN   WBC, Wet Prep HPF POC MANY (*) NONE SEEN     MAU Course  Procedures None  MDM UA, Wet prep and GC/Chlamydia today +FHTs No active bleeding from the cervix. Cervix is closed.  Assessment and Plan  A: Vaginal bleeding in pregnancy, second trimester  P: Discharge  home Pelvic rest advised Bleeding precautions discussed Patient encouraged to keep follow-up with Dr. Gaynell Face as scheduled or call for earlier appointment if symptoms persist or worsen Patient may return to MAU as needed or if her condition were to change or worsen  Freddi Starr, PA-C  05/09/2013, 11:30 AM

## 2013-07-25 ENCOUNTER — Inpatient Hospital Stay (HOSPITAL_COMMUNITY)
Admission: AD | Admit: 2013-07-25 | Discharge: 2013-07-27 | DRG: 778 | Disposition: A | Discharge status: Home / Self Care | Payer: Medicaid Other | Source: Ambulatory Visit | Attending: Obstetrics | Admitting: Obstetrics

## 2013-07-25 ENCOUNTER — Encounter (HOSPITAL_COMMUNITY): Payer: Self-pay | Admitting: *Deleted

## 2013-07-25 DIAGNOSIS — O4700 False labor before 37 completed weeks of gestation, unspecified trimester: Principal | ICD-10-CM | POA: Diagnosis present

## 2013-07-25 LAB — WET PREP, GENITAL: Yeast Wet Prep HPF POC: NONE SEEN

## 2013-07-25 LAB — URINALYSIS, ROUTINE W REFLEX MICROSCOPIC
Glucose, UA: NEGATIVE mg/dL
Hgb urine dipstick: NEGATIVE
Ketones, ur: NEGATIVE mg/dL
Leukocytes, UA: NEGATIVE
pH: 7.5 (ref 5.0–8.0)

## 2013-07-25 LAB — COMPREHENSIVE METABOLIC PANEL
ALT: 8 U/L (ref 0–35)
Alkaline Phosphatase: 66 U/L (ref 39–117)
CO2: 23 mEq/L (ref 19–32)
Creatinine, Ser: 0.47 mg/dL — ABNORMAL LOW (ref 0.50–1.10)
GFR calc Af Amer: >90 mL/min (ref >90)
GFR calc non Af Amer: >90 mL/min (ref >90)
Glucose, Bld: 102 mg/dL — ABNORMAL HIGH (ref 70–99)
Potassium: 3.6 mEq/L (ref 3.5–5.1)
Sodium: 135 mEq/L (ref 135–145)
Total Bilirubin: 0.3 mg/dL (ref 0.3–1.2)

## 2013-07-25 LAB — TYPE AND SCREEN: Antibody Screen: NEGATIVE

## 2013-07-25 LAB — CBC
HCT: 29 % — ABNORMAL LOW (ref 36.0–46.0)
Hemoglobin: 9.9 g/dL — ABNORMAL LOW (ref 12.0–15.0)
MCV: 84.5 fL (ref 78.0–100.0)
WBC: 6.6 10*3/uL (ref 4.0–10.5)

## 2013-07-25 MED ORDER — MAGNESIUM SULFATE BOLUS VIA INFUSION
4.0000 g | Freq: Once | INTRAVENOUS | Status: AC
  Administered 2013-07-25: 4 g via INTRAVENOUS
  Filled 2013-07-25: qty 500

## 2013-07-25 MED ORDER — PROMETHAZINE HCL 25 MG/ML IJ SOLN
25.0000 mg | Freq: Once | INTRAMUSCULAR | Status: DC
  Filled 2013-07-25: qty 1

## 2013-07-25 MED ORDER — LACTATED RINGERS IV SOLN
INTRAVENOUS | Status: DC
  Administered 2013-07-25 – 2013-07-26 (×4): via INTRAVENOUS

## 2013-07-25 MED ORDER — MORPHINE SULFATE 10 MG/ML IJ SOLN
10.0000 mg | Freq: Once | INTRAMUSCULAR | Status: AC
  Administered 2013-07-25: 10 mg via INTRAMUSCULAR
  Filled 2013-07-25: qty 1

## 2013-07-25 MED ORDER — PROMETHAZINE HCL 25 MG/ML IJ SOLN: 25.0000 mg | Freq: Once | INTRAMUSCULAR | Status: DC

## 2013-07-25 MED ORDER — LIDOCAINE HCL (PF) 1 % IJ SOLN: 30.0000 mL | INTRAMUSCULAR | Status: DC | PRN

## 2013-07-25 MED ORDER — BETAMETHASONE SOD PHOS & ACET 6 (3-3) MG/ML IJ SUSP
12.0000 mg | INTRAMUSCULAR | Status: AC
  Administered 2013-07-25 – 2013-07-26 (×2): 12 mg via INTRAMUSCULAR
  Filled 2013-07-25 (×2): qty 2

## 2013-07-25 MED ORDER — OXYTOCIN BOLUS FROM INFUSION: 500.0000 mL | INTRAVENOUS | Status: DC

## 2013-07-25 MED ORDER — ONDANSETRON HCL 4 MG/2ML IJ SOLN
4.0000 mg | Freq: Four times a day (QID) | INTRAMUSCULAR | Status: DC | PRN
  Administered 2013-07-25: 4 mg via INTRAVENOUS
  Filled 2013-07-25: qty 2

## 2013-07-25 MED ORDER — MAGNESIUM SULFATE 40 G IN LACTATED RINGERS - SIMPLE
2.0000 g/h | INTRAVENOUS | Status: DC
  Filled 2013-07-25: qty 500

## 2013-07-25 MED ORDER — CITRIC ACID-SODIUM CITRATE 334-500 MG/5ML PO SOLN: 30.0000 mL | ORAL | Status: DC | PRN

## 2013-07-25 MED ORDER — AZITHROMYCIN 500 MG PO TABS
500.0000 mg | ORAL_TABLET | Freq: Every day | ORAL | Status: DC
  Administered 2013-07-25 – 2013-07-27 (×3): 500 mg via ORAL
  Filled 2013-07-25 (×4): qty 1

## 2013-07-25 MED ORDER — ACETAMINOPHEN 325 MG PO TABS
650.0000 mg | ORAL_TABLET | ORAL | Status: DC | PRN
  Administered 2013-07-25 – 2013-07-26 (×3): 650 mg via ORAL
  Filled 2013-07-25 (×3): qty 2

## 2013-07-25 MED ORDER — MORPHINE SULFATE 10 MG/ML IJ SOLN: 10.0000 mg | Freq: Once | INTRAMUSCULAR | Status: DC

## 2013-07-25 MED ORDER — SODIUM CHLORIDE 0.9 % IV SOLN
3.0000 g | Freq: Four times a day (QID) | INTRAVENOUS | Status: DC
  Administered 2013-07-25 – 2013-07-27 (×10): 3 g via INTRAVENOUS
  Filled 2013-07-25 (×11): qty 3

## 2013-07-25 MED ORDER — MAGNESIUM SULFATE BOLUS VIA INFUSION: 4.0000 g | Freq: Once | INTRAVENOUS | Status: DC

## 2013-07-25 MED ORDER — OXYCODONE-ACETAMINOPHEN 5-325 MG PO TABS
1.0000 | ORAL_TABLET | ORAL | Status: DC | PRN
  Administered 2013-07-26: 2 via ORAL
  Filled 2013-07-25: qty 2

## 2013-07-25 MED ORDER — OXYTOCIN 40 UNITS IN LACTATED RINGERS INFUSION - SIMPLE MED: 62.5000 mL/h | INTRAVENOUS | Status: DC

## 2013-07-25 MED ORDER — IBUPROFEN 600 MG PO TABS: 600.0000 mg | ORAL_TABLET | Freq: Four times a day (QID) | ORAL | Status: DC | PRN

## 2013-07-25 MED ORDER — LACTATED RINGERS IV SOLN: 500.0000 mL | INTRAVENOUS | Status: DC | PRN

## 2013-07-25 MED ORDER — MAGNESIUM SULFATE 40 G IN LACTATED RINGERS - SIMPLE
2.0000 g/h | INTRAVENOUS | Status: DC
  Administered 2013-07-26: 2 g/h via INTRAVENOUS
  Filled 2013-07-25 (×2): qty 500

## 2013-07-25 NOTE — MAU Note (Signed)
Pt arrives via EMS with complaint of contractions at [redacted] weeks pregnant. Pt also reports spotting and states she has a low lying placenta

## 2013-07-25 NOTE — Progress Notes (Signed)
UR completed 

## 2013-07-25 NOTE — Progress Notes (Signed)
Patient ID: Margaret Cain, female   DOB: [birth year], 27 y.o.   MRN: [id number] Vital signs normal On magnesium sulfate for premature contractions and she's received her first dose of betamethasone Contractions much less now than on admission discontinue the present therapy

## 2013-07-25 NOTE — MAU Provider Note (Signed)
  History     CSN: [id number]  Arrival date and time: 07/25/13 4098   First Provider Initiated Contact with Patient 07/25/13 0115      Chief Complaint  Patient presents with  . Contractions   HPI Comments: Margaret Cain 21 [id number] presents to MAU via EMS for frequent contractions. She is 28 weeks and 5 days pregnant and a patient of Dr Thomes Lolling. She has a low lying placenta. She noticed contractions for the last 2 hours.         Past Medical History  Diagnosis Date  . Anemia   . Headache(784.0)   . Urinary tract infection   . Pregnancy induced hypertension     Past Surgical History  Procedure Laterality Date  . No past surgeries      Family History  Problem Relation Age of Onset  . Anesthesia problems Neg Hx   . Hypotension Neg Hx   . Malignant hyperthermia Neg Hx   . Pseudochol deficiency Neg Hx   . Other Neg Hx   . Cancer Neg Hx   . Heart disease Neg Hx   . Stroke Neg Hx   . Hearing loss Neg Hx   . Asthma Son   . Asthma Paternal Aunt     History  Substance Use Topics  . Smoking status: Never Smoker   . Smokeless tobacco: Never Used  . Alcohol Use: No    Allergies: No Known Allergies  Prescriptions prior to admission  Medication Sig Dispense Refill  . Prenatal Vit-Fe Fumarate-FA (PRENATAL MULTIVITAMIN) TABS Take 1 tablet by mouth daily at 12 noon.        Review of Systems  Constitutional: Negative.   HENT: Negative.   Respiratory: Negative.   Cardiovascular: Negative.   Gastrointestinal: Positive for abdominal pain.  Genitourinary: Negative.   Musculoskeletal: Negative.   Skin: Negative.   Neurological: Negative.   Psychiatric/Behavioral: Negative.    Physical Exam   Blood pressure 100/61, pulse 90, temperature 98.2 F (36.8 C), temperature source Oral, resp. rate 18, height 4' 11.5" (1.511 m), weight 113 lb (51.256 kg), last menstrual period 12/13/2011, SpO2 100.00%.  Physical Exam  Constitutional: She is oriented to person,  place, and time. She appears well-developed and well-nourished.  HENT:  Head: Normocephalic and atraumatic.  Eyes: Pupils are equal, round, and reactive to light.  Genitourinary:  Cervix 1 cm , long  Musculoskeletal: Normal range of motion.  Neurological: She is alert and oriented to person, place, and time.  Skin: Skin is warm.  Psychiatric: She has a normal mood and affect.    MAU Course  Procedures  MDM Spoke with Dr Clearance Coots who advised 4 Gram loading dose of Mag and 2 Grams per hour infusion. Morphine 10mg  with phenergan 25 mg IM now. Cath UA and wet prep. Admit to Antipartum   Assessment and Plan   A: Preterm contractions P: Above and Admit to Antepartum  Carolynn Serve 07/25/2013, 1:26 AM

## 2013-07-25 NOTE — H&P (Signed)
Kennedee EVALUNA UTKE is a 27 y.o. female presenting for UC's. Maternal Medical History:  Reason for admission: Contractions.  27 yo G5  P2113.  EDC 16-1096.  Presents with UC's.  Contractions: Onset was 3-5 hours ago.   Frequency: regular.   Perceived severity is strong.    Fetal activity: Perceived fetal activity is normal.   Last perceived fetal movement was within the past hour.    Prenatal complications: no prenatal complications Prenatal Complications - Diabetes: none.    OB History   Grav Para Term Preterm Abortions TAB SAB Ect Mult Living   5 3 2 1 1  0 1 0 0 3     Past Medical History  Diagnosis Date  . Anemia   . Headache(784.0)   . Urinary tract infection   . Pregnancy induced hypertension    Past Surgical History  Procedure Laterality Date  . No past surgeries     Family History: family history includes Asthma in her paternal aunt and son. There is no history of Anesthesia problems, Hypotension, Malignant hyperthermia, Pseudochol deficiency, Other, Cancer, Heart disease, Stroke, or Hearing loss. Social History:  reports that she has never smoked. She has never used smokeless tobacco. She reports that she does not drink alcohol or use illicit drugs.   Prenatal Transfer Tool  Maternal Diabetes: No Genetic Screening: Normal Maternal Ultrasounds/Referrals: Normal Fetal Ultrasounds or other Referrals:  None Maternal Substance Abuse:  No Significant Maternal Medications:  None Significant Maternal Lab Results:  None Other Comments:  None  Review of Systems  All other systems reviewed and are negative.      Blood pressure 100/61, pulse 90, temperature 98.2 F (36.8 C), temperature source Oral, resp. rate 18, height 4' 11.5" (1.511 m), weight 113 lb (51.256 kg), last menstrual period 12/13/2011, SpO2 100.00%. Maternal Exam:  Uterine Assessment: Contraction strength is moderate.  Contraction frequency is regular.   Abdomen: Patient reports no abdominal  tenderness. Introitus: Normal vulva. Normal vagina.  Pelvis: adequate for delivery.   Cervix: Cervix evaluated by digital exam.     Physical Exam  Nursing note and vitals reviewed. Constitutional: She is oriented to person, place, and time. She appears well-developed and well-nourished.  HENT:  Head: Normocephalic and atraumatic.  Eyes: Conjunctivae are normal. Pupils are equal, round, and reactive to light.  Neck: Normal range of motion. Neck supple.  Cardiovascular: Normal rate and regular rhythm.   Respiratory: Effort normal.  GI: Soft.  Genitourinary: Vagina normal and uterus normal.  Musculoskeletal: Normal range of motion.  Neurological: She is alert and oriented to person, place, and time.  Skin: Skin is warm and dry.  Psychiatric: She has a normal mood and affect. Her behavior is normal. Judgment and thought content normal.    Prenatal labs: ABO, Rh:   Antibody:   Rubella:   RPR:    HBsAg:    HIV:    GBS:     Assessment/Plan: 28.5 weeks.  Preterm UC's.  Admit.  Magnesium Sulfate tocolysis and CP prophylaxis.   HARPER,CHARLES A 07/25/2013, 1:27 AM

## 2013-07-26 MED ORDER — NIFEDIPINE 10 MG PO CAPS
10.0000 mg | ORAL_CAPSULE | Freq: Four times a day (QID) | ORAL | Status: DC
  Administered 2013-07-26 – 2013-07-27 (×4): 10 mg via ORAL
  Filled 2013-07-26 (×4): qty 1

## 2013-07-26 MED ORDER — TERBUTALINE SULFATE 1 MG/ML IJ SOLN
0.2500 mg | Freq: Once | INTRAMUSCULAR | Status: AC
  Administered 2013-07-26: 0.25 mg via SUBCUTANEOUS
  Filled 2013-07-26: qty 1

## 2013-07-26 MED ORDER — POLYETHYLENE GLYCOL 3350 17 G PO PACK
17.0000 g | PACK | Freq: Every day | ORAL | Status: DC
  Administered 2013-07-26: 17 g via ORAL
  Filled 2013-07-26 (×2): qty 1

## 2013-07-26 NOTE — Progress Notes (Signed)
Patient ID: Margaret Cain, female   DOB: 08-Jul-1986, 27 y.o.   MRN: [id number] Vital signs normal Received a second dose of betamethasone Having occasional contraction magnesium sulfate discontinued today and and is starting on Procardia 10 mg by mouth every 6 hours and

## 2013-07-27 NOTE — Progress Notes (Signed)
Patient discharged in stable ambulatory condition with son at side. Denies any contractions and reports good fetal movement. Discharge teaching completed with teach back and verbalized understanding. Pt to follow up on the 16th with Dr. Gaynell Face at his office.

## 2013-07-27 NOTE — Discharge Summary (Signed)
  Patient was admitted at 30 weeks because of premature contractions she was on magnesium sulfate initially and got betamethasone x2 she has been on Procardia 10 mg by mouth every 6 hours and she's not having any contractions discharge today to see me in one week

## 2013-08-24 ENCOUNTER — Inpatient Hospital Stay (HOSPITAL_COMMUNITY)
Admission: AD | Admit: 2013-08-24 | Discharge: 2013-08-24 | Disposition: A | Discharge status: Home / Self Care | Payer: Medicaid Other | Source: Ambulatory Visit | Attending: Obstetrics | Admitting: Obstetrics

## 2013-08-24 ENCOUNTER — Encounter (HOSPITAL_COMMUNITY): Payer: Self-pay | Admitting: *Deleted

## 2013-08-24 DIAGNOSIS — N644 Mastodynia: Secondary | ICD-10-CM | POA: Insufficient documentation

## 2013-08-24 DIAGNOSIS — O99891 Other specified diseases and conditions complicating pregnancy: Secondary | ICD-10-CM | POA: Insufficient documentation

## 2013-08-24 DIAGNOSIS — O479 False labor, unspecified: Secondary | ICD-10-CM

## 2013-08-24 DIAGNOSIS — M549 Dorsalgia, unspecified: Secondary | ICD-10-CM | POA: Insufficient documentation

## 2013-08-24 DIAGNOSIS — O4703 False labor before 37 completed weeks of gestation, third trimester: Secondary | ICD-10-CM

## 2013-08-24 DIAGNOSIS — O4700 False labor before 37 completed weeks of gestation, unspecified trimester: Secondary | ICD-10-CM | POA: Insufficient documentation

## 2013-08-24 LAB — URINALYSIS, ROUTINE W REFLEX MICROSCOPIC
Bilirubin Urine: NEGATIVE
Glucose, UA: NEGATIVE mg/dL
Hgb urine dipstick: NEGATIVE
Leukocytes, UA: NEGATIVE
Protein, ur: NEGATIVE mg/dL
Specific Gravity, Urine: 1.015 (ref 1.005–1.030)
Urobilinogen, UA: 0.2 mg/dL (ref 0.0–1.0)
pH: 6.5 (ref 5.0–8.0)

## 2013-08-24 LAB — WET PREP, GENITAL
Clue Cells Wet Prep HPF POC: NONE SEEN
Trich, Wet Prep: NONE SEEN

## 2013-08-24 LAB — AMNISURE RUPTURE OF MEMBRANE (ROM) NOT AT ARMC: Amnisure ROM: NEGATIVE

## 2013-08-24 LAB — FETAL FIBRONECTIN: Fetal Fibronectin: NEGATIVE

## 2013-08-24 MED ORDER — HYDROXYZINE HCL 25 MG PO TABS: 25.0000 mg | ORAL_TABLET | Freq: Four times a day (QID) | ORAL | Status: DC

## 2013-08-24 NOTE — MAU Note (Signed)
Pt. Here due to contractions, back pain and breast pain that began 3 days ago. Symptoms increased this am around 0830-0900 am. Is experiencing white thick discharge and feels she may have a bacterial infection. Last saw Dr. Gaynell Face 2 weeks ago. Next appointment is November 13.

## 2013-08-24 NOTE — MAU Provider Note (Signed)
History     CSN: [id number]  Arrival date and time: 08/24/13 1323   First Provider Initiated Contact with Patient 08/24/13 1432      Chief Complaint  Patient presents with  . Breast Pain   HPI Comments: Margaret Cain 27 y.o. [id number] presents to MAU for irregular contractions, vaginal discharge and breast pains. She is 35 weeks and 6 days  pregnant and sees Dr Gaynell Face. She had sexual intercourse 3 nights ago. Denies any bleeding.         Past Medical History  Diagnosis Date  . Anemia   . Headache(784.0)   . Urinary tract infection   . Pregnancy induced hypertension     Past Surgical History  Procedure Laterality Date  . No past surgeries      Family History  Problem Relation Age of Onset  . Anesthesia problems Neg Hx   . Hypotension Neg Hx   . Malignant hyperthermia Neg Hx   . Pseudochol deficiency Neg Hx   . Other Neg Hx   . Cancer Neg Hx   . Heart disease Neg Hx   . Stroke Neg Hx   . Hearing loss Neg Hx   . Asthma Son   . Asthma Paternal Aunt     History  Substance Use Topics  . Smoking status: Never Smoker   . Smokeless tobacco: Never Used  . Alcohol Use: No    Allergies: No Known Allergies  Prescriptions prior to admission  Medication Sig Dispense Refill  . Prenatal Vit-Fe Fumarate-FA (PRENATAL MULTIVITAMIN) TABS Take 1 tablet by mouth daily at 12 noon.        Review of Systems  Constitutional: Negative.   HENT: Negative.   Eyes: Negative.   Respiratory: Negative.   Cardiovascular: Negative.   Gastrointestinal: Negative.   Genitourinary:       Vaginal discharge  Musculoskeletal:       Bilateral breast pains/ has on under wire bra that is 3 sizes too small  Skin: Negative.   Neurological: Negative.   Psychiatric/Behavioral: The patient is nervous/anxious.    Physical Exam   Blood pressure 99/63, pulse 106, temperature 98 F (36.7 C), temperature source Oral, resp. rate 18, height 5\' 1"  (1.549 m), weight 52.164 kg (115 lb), last  menstrual period 12/13/2011.  Physical Exam  Constitutional: She is oriented to person, place, and time. She appears well-developed and well-nourished. No distress.  HENT:  Head: Normocephalic and atraumatic.  Eyes: Pupils are equal, round, and reactive to light.  GI: Soft. Bowel sounds are normal. She exhibits no distension and no mass. There is no tenderness. There is no rebound and no guarding.  Genitourinary:  Genitalia: External: negative Vag: thick creamy non odorous discharge Cervix: 2 cm with 50 % effacement Biman: Vertex  Musculoskeletal: Normal range of motion.  Pain in breasts is related to under wires in bra  Neurological: She is alert and oriented to person, place, and time.  Skin: Skin is warm and dry.  Psychiatric: Her mood appears anxious.   Results for orders placed during the hospital encounter of 08/24/13 (from the past 24 hour(s))  URINALYSIS, ROUTINE W REFLEX MICROSCOPIC     Status: None   Collection Time    08/24/13  1:48 PM      Result Value Range   Color, Urine YELLOW  YELLOW   APPearance CLEAR  CLEAR   Specific Gravity, Urine 1.015  1.005 - 1.030   pH 6.5  5.0 - 8.0   Glucose,  UA NEGATIVE  NEGATIVE mg/dL   Hgb urine dipstick NEGATIVE  NEGATIVE   Bilirubin Urine NEGATIVE  NEGATIVE   Ketones, ur NEGATIVE  NEGATIVE mg/dL   Protein, ur NEGATIVE  NEGATIVE mg/dL   Urobilinogen, UA 0.2  0.0 - 1.0 mg/dL   Nitrite NEGATIVE  NEGATIVE   Leukocytes, UA NEGATIVE  NEGATIVE  WET PREP, GENITAL     Status: Abnormal   Collection Time    08/24/13  2:45 PM      Result Value Range   Yeast Wet Prep HPF POC NONE SEEN  NONE SEEN   Trich, Wet Prep NONE SEEN  NONE SEEN   Clue Cells Wet Prep HPF POC NONE SEEN  NONE SEEN   WBC, Wet Prep HPF POC MANY (*) NONE SEEN  FETAL FIBRONECTIN     Status: None   Collection Time    08/24/13  2:45 PM      Result Value Range   Fetal Fibronectin NEGATIVE  NEGATIVE  AMNISURE RUPTURE OF MEMBRANE (ROM)     Status: None   Collection Time     08/24/13  3:35 PM      Result Value Range   Amnisure ROM NEGATIVE     Fern= Negative/ ? Crystals   MAU Course  Procedures  MDM  Wet Prep, Fern, FFN, amniosure,  Spoke with Dr Jean RosenthalChristell Constant who advised sending her home  Assessment and Plan   A: False Labor   P:  Above tests were done/ Reassurance Keep appointment with Dr Gaynell Face on Thursday or return to MAU if contractions are 5 min apart or water breaks Pt requested something to help her rest/ Vistaril 25 mg po qhs  Carolynn Serve 08/24/2013, 4:21 PM

## 2013-08-24 NOTE — MAU Note (Signed)
Fern slide indeterminate. Amnisure collected.

## 2013-08-24 NOTE — MAU Note (Addendum)
Pt. Has been experiencing breast pain, irregular contractions and back pain for the past 3 days. Also, feels that she has a bacterial infection due to white thick discharge she has been having for 3 days. Last saw Dr. Gaynell Face 2 weeks ago.

## 2013-08-28 LAB — OB RESULTS CONSOLE GC/CHLAMYDIA: Gonorrhea: NEGATIVE

## 2013-09-07 ENCOUNTER — Inpatient Hospital Stay (HOSPITAL_COMMUNITY)
Admission: AD | Admit: 2013-09-07 | Discharge: 2013-09-07 | Disposition: A | Discharge status: Home / Self Care | Payer: Medicaid Other | Source: Ambulatory Visit | Attending: Obstetrics | Admitting: Obstetrics

## 2013-09-07 ENCOUNTER — Encounter (HOSPITAL_COMMUNITY): Payer: Self-pay | Admitting: *Deleted

## 2013-09-07 DIAGNOSIS — O479 False labor, unspecified: Secondary | ICD-10-CM | POA: Insufficient documentation

## 2013-09-07 DIAGNOSIS — N898 Other specified noninflammatory disorders of vagina: Secondary | ICD-10-CM | POA: Insufficient documentation

## 2013-09-07 DIAGNOSIS — M549 Dorsalgia, unspecified: Secondary | ICD-10-CM | POA: Insufficient documentation

## 2013-09-07 NOTE — MAU Note (Signed)
Patient complains of back pain, contractions and vaginal discharge for the last several hours. Patient thinks the vaginal discharge is from her cervix dilating  Contractions back to back- patient states she hasn't tried to count how often.

## 2013-09-07 NOTE — MAU Note (Signed)
White thick creamy discharge seen after SVE.

## 2013-09-15 ENCOUNTER — Inpatient Hospital Stay (HOSPITAL_COMMUNITY)
Admission: AD | Admit: 2013-09-15 | Discharge: 2013-09-15 | Disposition: A | Discharge status: Home / Self Care | Payer: Medicaid Other | Source: Ambulatory Visit | Attending: Obstetrics | Admitting: Obstetrics

## 2013-09-15 ENCOUNTER — Encounter (HOSPITAL_COMMUNITY): Payer: Self-pay | Admitting: General Practice

## 2013-09-15 DIAGNOSIS — O479 False labor, unspecified: Secondary | ICD-10-CM | POA: Insufficient documentation

## 2013-09-15 NOTE — MAU Note (Signed)
Pt presents to MAU vis EMS with c/o contractions all day but becoming stronger about 1900. Pt has c/o pressure in her vagina

## 2013-09-18 ENCOUNTER — Telehealth (HOSPITAL_COMMUNITY): Payer: Self-pay | Admitting: *Deleted

## 2013-09-18 ENCOUNTER — Encounter (HOSPITAL_COMMUNITY): Payer: Self-pay | Admitting: *Deleted

## 2013-09-18 NOTE — Telephone Encounter (Signed)
Preadmission screen  

## 2013-09-20 ENCOUNTER — Encounter (HOSPITAL_COMMUNITY): Payer: Self-pay

## 2013-09-20 ENCOUNTER — Inpatient Hospital Stay (HOSPITAL_COMMUNITY)
Admission: RE | Admit: 2013-09-20 | Discharge: 2013-09-22 | DRG: 775 | Disposition: A | Discharge status: Home / Self Care | Payer: Medicaid Other | Source: Ambulatory Visit | Attending: Obstetrics | Admitting: Obstetrics

## 2013-09-20 ENCOUNTER — Inpatient Hospital Stay (HOSPITAL_COMMUNITY): Payer: Medicaid Other | Admitting: Anesthesiology

## 2013-09-20 ENCOUNTER — Encounter (HOSPITAL_COMMUNITY): Payer: Medicaid Other | Admitting: Anesthesiology

## 2013-09-20 DIAGNOSIS — Z22330 Carrier of Group B streptococcus: Secondary | ICD-10-CM

## 2013-09-20 DIAGNOSIS — O99892 Other specified diseases and conditions complicating childbirth: Principal | ICD-10-CM | POA: Diagnosis present

## 2013-09-20 LAB — CBC
HCT: 30.3 % — ABNORMAL LOW (ref 36.0–46.0)
Hemoglobin: 9.8 g/dL — ABNORMAL LOW (ref 12.0–15.0)
MCH: 27.2 pg (ref 26.0–34.0)
MCHC: 32.3 g/dL (ref 30.0–36.0)
RDW: 13.1 % (ref 11.5–15.5)

## 2013-09-20 LAB — TYPE AND SCREEN
ABO/RH(D): O POS
Antibody Screen: NEGATIVE

## 2013-09-20 MED ORDER — SIMETHICONE 80 MG PO CHEW: 80.0000 mg | CHEWABLE_TABLET | ORAL | Status: DC | PRN

## 2013-09-20 MED ORDER — LIDOCAINE HCL (PF) 1 % IJ SOLN
INTRAMUSCULAR | Status: DC | PRN
  Administered 2013-09-20 (×2): 4 mL

## 2013-09-20 MED ORDER — LANOLIN HYDROUS EX OINT: TOPICAL_OINTMENT | CUTANEOUS | Status: DC | PRN

## 2013-09-20 MED ORDER — LACTATED RINGERS IV SOLN: 500.0000 mL | INTRAVENOUS | Status: DC | PRN

## 2013-09-20 MED ORDER — ACETAMINOPHEN 325 MG PO TABS
650.0000 mg | ORAL_TABLET | ORAL | Status: DC | PRN
  Administered 2013-09-20: 650 mg via ORAL
  Filled 2013-09-20: qty 2

## 2013-09-20 MED ORDER — PHENYLEPHRINE 40 MCG/ML (10ML) SYRINGE FOR IV PUSH (FOR BLOOD PRESSURE SUPPORT)
80.0000 ug | PREFILLED_SYRINGE | INTRAVENOUS | Status: DC | PRN
  Filled 2013-09-20: qty 2
  Filled 2013-09-20: qty 10

## 2013-09-20 MED ORDER — DIBUCAINE 1 % RE OINT: 1.0000 "application " | TOPICAL_OINTMENT | RECTAL | Status: DC | PRN

## 2013-09-20 MED ORDER — LIDOCAINE HCL (PF) 1 % IJ SOLN
30.0000 mL | INTRAMUSCULAR | Status: DC | PRN
  Filled 2013-09-20: qty 30

## 2013-09-20 MED ORDER — ONDANSETRON HCL 4 MG/2ML IJ SOLN: 4.0000 mg | INTRAMUSCULAR | Status: DC | PRN

## 2013-09-20 MED ORDER — ONDANSETRON HCL 4 MG PO TABS
4.0000 mg | ORAL_TABLET | ORAL | Status: DC | PRN
  Administered 2013-09-21: 4 mg via ORAL
  Filled 2013-09-20: qty 1

## 2013-09-20 MED ORDER — LACTATED RINGERS IV SOLN
500.0000 mL | Freq: Once | INTRAVENOUS | Status: AC
  Administered 2013-09-20: 1000 mL via INTRAVENOUS

## 2013-09-20 MED ORDER — SODIUM CHLORIDE 0.9 % IV SOLN
2.0000 g | Freq: Once | INTRAVENOUS | Status: AC
  Administered 2013-09-20: 2 g via INTRAVENOUS
  Filled 2013-09-20: qty 2000

## 2013-09-20 MED ORDER — CITRIC ACID-SODIUM CITRATE 334-500 MG/5ML PO SOLN: 30.0000 mL | ORAL | Status: DC | PRN

## 2013-09-20 MED ORDER — INFLUENZA VAC SPLIT QUAD 0.5 ML IM SUSP
0.5000 mL | INTRAMUSCULAR | Status: AC
  Administered 2013-09-21: 0.5 mL via INTRAMUSCULAR
  Filled 2013-09-20: qty 0.5

## 2013-09-20 MED ORDER — ONDANSETRON HCL 4 MG/2ML IJ SOLN
4.0000 mg | Freq: Four times a day (QID) | INTRAMUSCULAR | Status: DC | PRN
  Administered 2013-09-20: 4 mg via INTRAVENOUS
  Filled 2013-09-20: qty 2

## 2013-09-20 MED ORDER — FLEET ENEMA 7-19 GM/118ML RE ENEM: 1.0000 | ENEMA | RECTAL | Status: DC | PRN

## 2013-09-20 MED ORDER — IBUPROFEN 600 MG PO TABS
600.0000 mg | ORAL_TABLET | Freq: Four times a day (QID) | ORAL | Status: DC
  Administered 2013-09-20 – 2013-09-22 (×7): 600 mg via ORAL
  Filled 2013-09-20 (×8): qty 1

## 2013-09-20 MED ORDER — OXYTOCIN 40 UNITS IN LACTATED RINGERS INFUSION - SIMPLE MED
62.5000 mL/h | INTRAVENOUS | Status: DC
  Filled 2013-09-20: qty 1000

## 2013-09-20 MED ORDER — IBUPROFEN 600 MG PO TABS
600.0000 mg | ORAL_TABLET | Freq: Four times a day (QID) | ORAL | Status: DC | PRN
  Administered 2013-09-20: 600 mg via ORAL
  Filled 2013-09-20: qty 1

## 2013-09-20 MED ORDER — BENZOCAINE-MENTHOL 20-0.5 % EX AERO: 1.0000 "application " | INHALATION_SPRAY | CUTANEOUS | Status: DC | PRN

## 2013-09-20 MED ORDER — DIPHENHYDRAMINE HCL 50 MG/ML IJ SOLN: 12.5000 mg | INTRAMUSCULAR | Status: DC | PRN

## 2013-09-20 MED ORDER — FENTANYL 2.5 MCG/ML BUPIVACAINE 1/10 % EPIDURAL INFUSION (WH - ANES)
14.0000 mL/h | INTRAMUSCULAR | Status: DC | PRN
  Filled 2013-09-20: qty 125

## 2013-09-20 MED ORDER — SENNOSIDES-DOCUSATE SODIUM 8.6-50 MG PO TABS
2.0000 | ORAL_TABLET | ORAL | Status: DC
  Administered 2013-09-20 – 2013-09-21 (×2): 2 via ORAL
  Filled 2013-09-20 (×2): qty 2

## 2013-09-20 MED ORDER — WITCH HAZEL-GLYCERIN EX PADS: 1.0000 "application " | MEDICATED_PAD | CUTANEOUS | Status: DC | PRN

## 2013-09-20 MED ORDER — OXYCODONE-ACETAMINOPHEN 5-325 MG PO TABS
1.0000 | ORAL_TABLET | ORAL | Status: DC | PRN
  Administered 2013-09-20 – 2013-09-22 (×5): 1 via ORAL
  Filled 2013-09-20 (×5): qty 1

## 2013-09-20 MED ORDER — FENTANYL 2.5 MCG/ML BUPIVACAINE 1/10 % EPIDURAL INFUSION (WH - ANES)
INTRAMUSCULAR | Status: DC | PRN
  Administered 2013-09-20: 13 mL/h via EPIDURAL

## 2013-09-20 MED ORDER — TETANUS-DIPHTH-ACELL PERTUSSIS 5-2.5-18.5 LF-MCG/0.5 IM SUSP
0.5000 mL | Freq: Once | INTRAMUSCULAR | Status: AC
  Administered 2013-09-22: 0.5 mL via INTRAMUSCULAR
  Filled 2013-09-20 (×2): qty 0.5

## 2013-09-20 MED ORDER — EPHEDRINE 5 MG/ML INJ
10.0000 mg | INTRAVENOUS | Status: DC | PRN
  Administered 2013-09-20: 10 mg via INTRAVENOUS
  Filled 2013-09-20: qty 2

## 2013-09-20 MED ORDER — LACTATED RINGERS IV SOLN: INTRAVENOUS | Status: DC

## 2013-09-20 MED ORDER — PHENYLEPHRINE 40 MCG/ML (10ML) SYRINGE FOR IV PUSH (FOR BLOOD PRESSURE SUPPORT)
80.0000 ug | PREFILLED_SYRINGE | INTRAVENOUS | Status: DC | PRN
  Filled 2013-09-20: qty 2

## 2013-09-20 MED ORDER — FERROUS SULFATE 325 (65 FE) MG PO TABS
325.0000 mg | ORAL_TABLET | Freq: Two times a day (BID) | ORAL | Status: DC
  Administered 2013-09-20 – 2013-09-22 (×4): 325 mg via ORAL
  Filled 2013-09-20 (×4): qty 1

## 2013-09-20 MED ORDER — OXYCODONE-ACETAMINOPHEN 5-325 MG PO TABS
1.0000 | ORAL_TABLET | ORAL | Status: DC | PRN
  Filled 2013-09-20: qty 1

## 2013-09-20 MED ORDER — ZOLPIDEM TARTRATE 5 MG PO TABS: 5.0000 mg | ORAL_TABLET | Freq: Every evening | ORAL | Status: DC | PRN

## 2013-09-20 MED ORDER — EPHEDRINE 5 MG/ML INJ
10.0000 mg | INTRAVENOUS | Status: DC | PRN
  Filled 2013-09-20: qty 2
  Filled 2013-09-20: qty 4

## 2013-09-20 MED ORDER — OXYTOCIN BOLUS FROM INFUSION
500.0000 mL | INTRAVENOUS | Status: DC
  Administered 2013-09-20: 500 mL via INTRAVENOUS

## 2013-09-20 MED ORDER — PRENATAL MULTIVITAMIN CH
1.0000 | ORAL_TABLET | Freq: Every day | ORAL | Status: DC
  Administered 2013-09-21: 1 via ORAL
  Filled 2013-09-20: qty 1

## 2013-09-20 MED ORDER — BUTORPHANOL TARTRATE 1 MG/ML IJ SOLN: 1.0000 mg | INTRAMUSCULAR | Status: DC | PRN

## 2013-09-20 MED ORDER — DIPHENHYDRAMINE HCL 25 MG PO CAPS: 25.0000 mg | ORAL_CAPSULE | Freq: Four times a day (QID) | ORAL | Status: DC | PRN

## 2013-09-20 NOTE — H&P (Signed)
This is Dr. Francoise Ceo dictating the history and physical on  Margaret Cain she's a 27 year old gravida 5 para 2113 at 39 weeks and 5 days positive GBS and not penicillin she was admitted in labor 7 cm 100% vertex -1 amniotomy performed the fluids clear and she progressed rapidly and had a normal vaginal her of a female Apgar 9 and 9 placenta was spontaneous intact and there was no episiotomy or laceration Past medical history negative Past surgical history negative Social history noncontributory System review negative Physical exam well-developed female in labor HEENT negative Lungs clear to P&A Breasts negative And heart regular rhythm no murmurs no gallops abdomen uterus 20 week postpartum size extremities negative

## 2013-09-20 NOTE — Anesthesia Preprocedure Evaluation (Signed)
Anesthesia Evaluation  Patient identified by MRN, date of birth, ID band Patient awake    Reviewed: Allergy & Precautions, H&P , Patient's Chart, lab work & pertinent test results  Airway Mallampati: III TM Distance: >3 FB Neck ROM: Full    Dental no notable dental hx. (+) Teeth Intact   Pulmonary neg pulmonary ROS,  breath sounds clear to auscultation  Pulmonary exam normal       Cardiovascular hypertension, negative cardio ROS  Rhythm:Regular Rate:Normal     Neuro/Psych  Headaches, PSYCHIATRIC DISORDERS Depression    GI/Hepatic negative GI ROS, Neg liver ROS,   Endo/Other  negative endocrine ROS  Renal/GU negative Renal ROS  negative genitourinary   Musculoskeletal negative musculoskeletal ROS (+)   Abdominal   Peds  Hematology  (+) anemia ,   Anesthesia Other Findings   Reproductive/Obstetrics (+) Pregnancy                           Anesthesia Physical Anesthesia Plan  ASA: II  Anesthesia Plan: Epidural   Post-op Pain Management:    Induction:   Airway Management Planned: Natural Airway  Additional Equipment:   Intra-op Plan:   Post-operative Plan:   Informed Consent: I have reviewed the patients History and Physical, chart, labs and discussed the procedure including the risks, benefits and alternatives for the proposed anesthesia with the patient or authorized representative who has indicated his/her understanding and acceptance.     Plan Discussed with: Anesthesiologist  Anesthesia Plan Comments:         Anesthesia Quick Evaluation

## 2013-09-20 NOTE — Anesthesia Procedure Notes (Signed)
Epidural Patient location during procedure: OB Start time: 09/20/2013 9:02 AM  Staffing Anesthesiologist: Dustan Hyams A. Performed by: anesthesiologist   Preanesthetic Checklist Completed: patient identified, site marked, surgical consent, pre-op evaluation, timeout performed, IV checked, risks and benefits discussed and monitors and equipment checked  Epidural Patient position: sitting Prep: site prepped and draped and DuraPrep Patient monitoring: continuous pulse ox and blood pressure Approach: midline Injection technique: LOR air  Needle:  Needle type: Tuohy  Needle gauge: 17 G Needle length: 9 cm and 9 Needle insertion depth: 4 cm Catheter type: closed end flexible Catheter size: 19 Gauge Catheter at skin depth: 9 cm Test dose: negative and Other  Assessment Events: blood not aspirated, injection not painful, no injection resistance, negative IV test and no paresthesia  Additional Notes Patient identified. Risks and benefits discussed including failed block, incomplete  Pain control, post dural puncture headache, nerve damage, paralysis, blood pressure Changes, nausea, vomiting, reactions to medications-both toxic and allergic and post Partum back pain. All questions were answered. Patient expressed understanding and wished to proceed. Sterile technique was used throughout procedure. Epidural site was Dressed with sterile barrier dressing. No paresthesias, signs of intravascular injection Or signs of intrathecal spread were encountered.  Patient was more comfortable after the epidural was dosed. Please see RN's note for documentation of vital signs and FHR which are stable.

## 2013-09-21 LAB — CBC
HCT: 25.9 % — ABNORMAL LOW (ref 36.0–46.0)
Hemoglobin: 8.5 g/dL — ABNORMAL LOW (ref 12.0–15.0)
MCH: 28.1 pg (ref 26.0–34.0)
MCHC: 32.8 g/dL (ref 30.0–36.0)
MCV: 85.5 fL (ref 78.0–100.0)
RDW: 12.5 % (ref 11.5–15.5)

## 2013-09-21 NOTE — Progress Notes (Signed)
Patient ID: Margaret Cain, female   DOB: 1985-10-23, 27 y.o.   MRN: [id number] Postpartum day  Day one Vital signs normal Fundus firm Legs negative Doing well and and

## 2013-09-21 NOTE — Progress Notes (Signed)
Clinical Social Work Department PSYCHOSOCIAL ASSESSMENT - MATERNAL/CHILD 09/21/2013  Patient:  KATELEEN, ENCARNACION  Account Number:  [id number]  Admit Date:  09/20/2013  Marjo Bicker Name:   Juline Patch    Clinical Social Worker:  Delno Blaisdell, LCSW   Date/Time:  09/21/2013 04:30 PM  Date Referred:  09/21/2013      Referred reason  Other - See comment   Other referral source:   Mother requested to speak with Social Worker    I:  FAMILY / HOME ENVIRONMENT Child's legal guardian:  PARENT  Guardian - Name Guardian - Age Guardian - Address  KATALEYAH, CARDUCCI [address]  [place], [place] [address]  Felipe Drone     Other household support members/support persons Other support:    II  PSYCHOSOCIAL DATA Information Source:    Event organiser Employment:   Both parents unemployed.  Maternal grandmother provides financial support   Financial resources:  Medicaid If Medicaid - [employer]:   Clinical biochemist  WIC   School / Grade:   Maternity Care Coordinator / Child Services Coordination / Early Interventions:  Cultural issues impacting care:    III  STRENGTHS  Strength comment:    IV  RISK FACTORS AND CURRENT PROBLEMS Current Problem:       V  SOCIAL WORK ASSESSMENT Mother requested to speak with CSW regarding assistance with clothing for newborn.  She is a single parent with 3 other dependents ages 44, 2, and 1.  Informed FOB is unemployed.  She lives alone with her children and maternal grandmother reportedly supports the family financially. She denies any hx of depression or substance abuse.      VI SOCIAL WORK PLAN  Type of pt/family education:   If child protective services report - county:   If child protective services report - date:   Information/referral to community resources comment:   Other social work plan:   Will request CSW follow up with mother on Monday to provide resources for clothing assistance.    Emmerich Cryer J, LCSW

## 2013-09-21 NOTE — Anesthesia Postprocedure Evaluation (Addendum)
  Anesthesia Post-op Note  Patient: Margaret Cain  Procedure(s) Performed: * No procedures listed *  Patient Location: Mother Baby  Anesthesia Type:Epidural  Level of Consciousness: awake and alert   Airway and Oxygen Therapy: Patient Spontanous Breathing  Post-op Pain: mild  Post-op Assessment: Patient's Cardiovascular Status Stable, Respiratory Function Stable, No signs of Nausea or vomiting, Pain level controlled, No headache, No residual numbness and No residual motor weakness  Post-op Vital Signs: stable  Complications: No apparent anesthesia complications

## 2013-09-22 NOTE — Discharge Summary (Signed)
Obstetric Discharge Summary Reason for Admission: induction of labor Prenatal Procedures: none Intrapartum Procedures: spontaneous vaginal delivery Postpartum Procedures: none Complications-Operative and Postpartum: none Hemoglobin  Date Value Range Status  09/21/2013 8.5* 12.0 - 15.0 g/dL Final     HCT  Date Value Range Status  09/21/2013 25.9* 36.0 - 46.0 % Final    Physical Exam:  General: alert Lochia: appropriate Uterine Fundus: firm Incision: healing well DVT Evaluation: No evidence of DVT seen on physical exam.  Discharge Diagnoses: Term Pregnancy-delivered  Discharge Information: Date: 09/22/2013 Activity: pelvic rest Diet: routine Medications: Percocet Condition: stable Instructions: refer to practice specific booklet Discharge to: home Follow-up Information   Schedule an appointment as soon as possible for a visit with Kathreen Cosier, MD.   Specialty:  Obstetrics and Gynecology   Contact information:   [address] [place] [address] [phone]       Newborn Data: Live born female  Birth Weight: 7 lb (3175 g) APGAR: 9, 9  Home with mother.  MARSHALL,BERNARD A 09/22/2013, 6:29 AM

## 2013-09-22 NOTE — Progress Notes (Signed)
CSW provided pt with a bundle pack of clothing, per her request.

## 2013-09-22 NOTE — Progress Notes (Signed)
Post discharge ur review completed. 

## 2013-10-16 NOTE — L&D Delivery Note (Signed)
Delivery Note At 6:47 AM a viable female was delivered via Vaginal, Spontaneous Delivery (Presentation: Right Occiput Anterior).  APGAR: 8, 9; weight 6 lb 15.3 oz (3155 g).   Placenta status: Intact, Spontaneous.  Cord: 3 vessels with the following complications: None.  Cord pH: not done  Anesthesia: None  Episiotomy: None Lacerations: None Suture Repair: 2.0 Est. Blood Loss (mL): 250  Mom to postpartum.  Baby to Couplet care / Skin to Skin.  MARSHALL,BERNARD A 09/08/2014, 8:00 AM

## 2014-02-02 LAB — OB RESULTS CONSOLE HIV ANTIBODY (ROUTINE TESTING): HIV: NONREACTIVE

## 2014-02-02 LAB — OB RESULTS CONSOLE GC/CHLAMYDIA
Chlamydia: NEGATIVE
Gonorrhea: NEGATIVE

## 2014-02-02 LAB — OB RESULTS CONSOLE RPR: RPR: NONREACTIVE

## 2014-04-07 LAB — OB RESULTS CONSOLE ANTIBODY SCREEN: Antibody Screen: NEGATIVE

## 2014-04-07 LAB — OB RESULTS CONSOLE HEPATITIS B SURFACE ANTIGEN: HEP B S AG: NEGATIVE

## 2014-04-07 LAB — OB RESULTS CONSOLE GC/CHLAMYDIA
Chlamydia: NEGATIVE
Gonorrhea: NEGATIVE

## 2014-04-07 LAB — OB RESULTS CONSOLE ABO/RH: RH Type: POSITIVE

## 2014-04-07 LAB — OB RESULTS CONSOLE RPR: RPR: NONREACTIVE

## 2014-04-07 LAB — OB RESULTS CONSOLE HIV ANTIBODY (ROUTINE TESTING): HIV: NONREACTIVE

## 2014-04-07 LAB — OB RESULTS CONSOLE RUBELLA ANTIBODY, IGM: Rubella: IMMUNE

## 2014-07-14 ENCOUNTER — Inpatient Hospital Stay (HOSPITAL_COMMUNITY)
Admission: AD | Admit: 2014-07-14 | Discharge: 2014-07-14 | Disposition: A | Discharge status: Home / Self Care | Payer: Medicaid Other | Source: Ambulatory Visit | Attending: Obstetrics | Admitting: Obstetrics

## 2014-07-14 ENCOUNTER — Encounter (HOSPITAL_COMMUNITY): Payer: Self-pay | Admitting: *Deleted

## 2014-07-14 ENCOUNTER — Inpatient Hospital Stay (HOSPITAL_COMMUNITY): Payer: Medicaid Other

## 2014-07-14 DIAGNOSIS — O4693 Antepartum hemorrhage, unspecified, third trimester: Secondary | ICD-10-CM

## 2014-07-14 DIAGNOSIS — O99891 Other specified diseases and conditions complicating pregnancy: Secondary | ICD-10-CM | POA: Insufficient documentation

## 2014-07-14 DIAGNOSIS — K5289 Other specified noninfective gastroenteritis and colitis: Secondary | ICD-10-CM | POA: Diagnosis not present

## 2014-07-14 DIAGNOSIS — R1011 Right upper quadrant pain: Secondary | ICD-10-CM | POA: Insufficient documentation

## 2014-07-14 DIAGNOSIS — O209 Hemorrhage in early pregnancy, unspecified: Secondary | ICD-10-CM | POA: Insufficient documentation

## 2014-07-14 DIAGNOSIS — O4690 Antepartum hemorrhage, unspecified, unspecified trimester: Secondary | ICD-10-CM

## 2014-07-14 DIAGNOSIS — K529 Noninfective gastroenteritis and colitis, unspecified: Secondary | ICD-10-CM

## 2014-07-14 DIAGNOSIS — N888 Other specified noninflammatory disorders of cervix uteri: Secondary | ICD-10-CM

## 2014-07-14 DIAGNOSIS — O9989 Other specified diseases and conditions complicating pregnancy, childbirth and the puerperium: Principal | ICD-10-CM

## 2014-07-14 LAB — URINALYSIS, ROUTINE W REFLEX MICROSCOPIC
BILIRUBIN URINE: NEGATIVE
Bilirubin Urine: NEGATIVE
Glucose, UA: NEGATIVE mg/dL
Glucose, UA: NEGATIVE mg/dL
Hgb urine dipstick: NEGATIVE
Ketones, ur: NEGATIVE mg/dL
Ketones, ur: 15 mg/dL — AB
Leukocytes, UA: NEGATIVE
NITRITE: NEGATIVE
NITRITE: NEGATIVE
PH: 6.5 (ref 5.0–8.0)
Protein, ur: NEGATIVE mg/dL
Protein, ur: NEGATIVE mg/dL
SPECIFIC GRAVITY, URINE: 1.02 (ref 1.005–1.030)
Urobilinogen, UA: 0.2 mg/dL (ref 0.0–1.0)
Urobilinogen, UA: 0.2 mg/dL (ref 0.0–1.0)
pH: 5.5 (ref 5.0–8.0)

## 2014-07-14 LAB — COMPREHENSIVE METABOLIC PANEL
ALT: 6 U/L (ref 0–35)
ANION GAP: 13 (ref 5–15)
AST: 12 U/L (ref 0–37)
Albumin: 2.7 g/dL — ABNORMAL LOW (ref 3.5–5.2)
Alkaline Phosphatase: 74 U/L (ref 39–117)
BILIRUBIN TOTAL: 0.2 mg/dL — AB (ref 0.3–1.2)
BUN: 4 mg/dL — ABNORMAL LOW (ref 6–23)
CHLORIDE: 103 meq/L (ref 96–112)
CO2: 21 mEq/L (ref 19–32)
CREATININE: 0.45 mg/dL — AB (ref 0.50–1.10)
Calcium: 8.3 mg/dL — ABNORMAL LOW (ref 8.4–10.5)
GFR calc non Af Amer: >90 mL/min (ref >90)
GLUCOSE: 82 mg/dL (ref 70–99)
POTASSIUM: 3.9 meq/L (ref 3.7–5.3)
Sodium: 137 mEq/L (ref 137–147)
Total Protein: 6.7 g/dL (ref 6.0–8.3)

## 2014-07-14 LAB — URINE MICROSCOPIC-ADD ON

## 2014-07-14 LAB — WET PREP, GENITAL
CLUE CELLS WET PREP: NONE SEEN
Trich, Wet Prep: NONE SEEN
Yeast Wet Prep HPF POC: NONE SEEN

## 2014-07-14 LAB — CBC
HCT: 30.5 % — ABNORMAL LOW (ref 36.0–46.0)
HEMOGLOBIN: 10.1 g/dL — AB (ref 12.0–15.0)
MCH: 28.9 pg (ref 26.0–34.0)
MCHC: 33.1 g/dL (ref 30.0–36.0)
MCV: 87.4 fL (ref 78.0–100.0)
Platelets: 160 10*3/uL (ref 150–400)
RBC: 3.49 MIL/uL — ABNORMAL LOW (ref 3.87–5.11)
RDW: 12.4 % (ref 11.5–15.5)
WBC: 8.8 10*3/uL (ref 4.0–10.5)

## 2014-07-14 LAB — LIPASE, BLOOD: LIPASE: 22 U/L (ref 11–59)

## 2014-07-14 LAB — AMYLASE: Amylase: 71 U/L (ref 0–105)

## 2014-07-14 MED ORDER — ONDANSETRON HCL 4 MG/2ML IJ SOLN
4.0000 mg | Freq: Once | INTRAMUSCULAR | Status: AC
  Administered 2014-07-14: 4 mg via INTRAVENOUS
  Filled 2014-07-14: qty 2

## 2014-07-14 MED ORDER — TRAMADOL HCL 50 MG PO TABS: 50.0000 mg | ORAL_TABLET | Freq: Four times a day (QID) | ORAL | Status: DC | PRN

## 2014-07-14 MED ORDER — NALBUPHINE HCL 10 MG/ML IJ SOLN
10.0000 mg | Freq: Once | INTRAMUSCULAR | Status: AC
  Administered 2014-07-14: 10 mg via INTRAVENOUS
  Filled 2014-07-14: qty 1

## 2014-07-14 MED ORDER — LACTATED RINGERS IV SOLN
INTRAVENOUS | Status: DC
  Administered 2014-07-14: 12:00:00 via INTRAVENOUS

## 2014-07-14 MED ORDER — PROMETHAZINE HCL 25 MG PO TABS: 25.0000 mg | ORAL_TABLET | Freq: Four times a day (QID) | ORAL | Status: DC | PRN

## 2014-07-14 MED ORDER — SODIUM CHLORIDE 0.9 % IV SOLN
25.0000 mg | Freq: Once | INTRAVENOUS | Status: AC
  Administered 2014-07-14: 25 mg via INTRAVENOUS

## 2014-07-14 NOTE — Progress Notes (Signed)
Waves of pain and nausea are now infrequent. Patient tolerating sips of apple juice.

## 2014-07-14 NOTE — MAU Provider Note (Signed)
Chief Complaint:  No chief complaint on file.   First Provider Initiated Contact with Patient 07/14/14 0815      HPI: Margaret Cain is a 28 y.o. [id number] at Unknown who presents to maternity admissions reporting RUQ pain and N/V since 0600 and spotting x3 days. Describes pain as sharp, intermittent, 8/10 on pain scale at worst. Minimal pain now. Has not tried anything for the pain or nausea and vomiting. Denies fever, chills, contractions, leakage of fluid or sick contacts. No history of similar pain. Last meal 7 PM last night. Good fetal movement.   Called office regarding spotting a few days ago. Told to be on bedrest. Has not been evaluated in person. Prenatal record unavailable at this time, but patient denies being told that she had a previa or low-lying placenta on anatomy scan. History of spontaneous preterm labor and birth at 35 weeks and 3 days. Other babies were full-term. No issues of preterm labor this pregnancy. No recent sexual intercourse.  Blood type O positive.  Past Medical History: Past Medical History  Diagnosis Date  . Anemia   . Headache(784.0)   . Urinary tract infection   . Pregnancy induced hypertension   . Hx of chlamydia infection   . Depression     ppd with first child    Past obstetric history: OB History  Gravida Para Term Preterm AB SAB TAB Ectopic Multiple Living  5 4 3 1 1 1  0 0 0 4    # Outcome Date GA Lbr Len/2nd Weight Sex Delivery Anes PTL Lv  5 TRM 09/20/13 [redacted]w[redacted]d 04:10 / 00:31 7 lb (3.175 kg) F SVD EPI  Y  4 PRE 07/17/12 [redacted]w[redacted]d 19:05 / 00:12 4 lb 5.7 oz (1.975 kg) M SVD EPI  Y  3 SAB 09/2011          2 TRM 2011 [redacted]w[redacted]d  7 lb 2 oz (3.232 kg) M SVD   Y  1 TRM 2005 [redacted]w[redacted]d  5 lb 7 oz (2.466 kg) M SVD   Y      Past Surgical History: Past Surgical History  Procedure Laterality Date  . No past surgeries       Family History: Family History  Problem Relation Age of Onset  . Anesthesia problems Neg Hx   . Hypotension Neg Hx   . Malignant  hyperthermia Neg Hx   . Pseudochol deficiency Neg Hx   . Other Neg Hx   . Cancer Neg Hx   . Heart disease Neg Hx   . Stroke Neg Hx   . Hearing loss Neg Hx   . Asthma Son   . Asthma Paternal Aunt     Social History: History  Substance Use Topics  . Smoking status: Never Smoker   . Smokeless tobacco: Never Used  . Alcohol Use: No    Allergies: No Known Allergies  Meds:  No prescriptions prior to admission    ROS: Pertinent findings in history of present illness.  Physical Exam  unknown if currently breastfeeding. GENERAL: Well-developed, well-nourished female in mild distress.  HEENT: normocephalic HEART: normal rate RESP: normal effort ABDOMEN: Soft, non-tender, gravid appropriate for gestational age. Pos BS x 4. No CVAT. EXTREMITIES: Nontender, no edema NEURO: alert and oriented SPECULUM EXAM: NEFG, small amount of brown blood, No active bleeding. Cervix engorged, highly vascular, multiparous-appearing. Slightly friable. No mucopurulent discharge. Dilation: Fingertip Effacement (%): Thick Cervical Position: Posterior Station: Ballotable Exam by:: Ivonne Andrew, CNM  FHT:  Baseline 130 , moderate  variability, accelerations present, no decelerations Contractions: UI   Labs: Results for orders placed during the hospital encounter of 07/14/14 (from the past 168 hour(s))  CBC   Collection Time    07/14/14  8:22 AM      Result Value Ref Range   WBC 8.8  4.0 - 10.5 K/uL   RBC 3.49 (*) 3.87 - 5.11 MIL/uL   Hemoglobin 10.1 (*) 12.0 - 15.0 g/dL   HCT 16.130.5 (*) 09.636.0 - 04.546.0 %   MCV 87.4  78.0 - 100.0 fL   MCH 28.9  26.0 - 34.0 pg   MCHC 33.1  30.0 - 36.0 g/dL   RDW 40.912.4  81.111.5 - 91.415.5 %   Platelets 160  150 - 400 K/uL  COMPREHENSIVE METABOLIC PANEL   Collection Time    07/14/14  8:22 AM      Result Value Ref Range   Sodium 137  137 - 147 mEq/L   Potassium 3.9  3.7 - 5.3 mEq/L   Chloride 103  96 - 112 mEq/L   CO2 21  19 - 32 mEq/L   Glucose, Bld 82  70 - 99 mg/dL    BUN 4 (*) 6 - 23 mg/dL   Creatinine, Ser 7.820.45 (*) 0.50 - 1.10 mg/dL   Calcium 8.3 (*) 8.4 - 10.5 mg/dL   Total Protein 6.7  6.0 - 8.3 g/dL   Albumin 2.7 (*) 3.5 - 5.2 g/dL   AST 12  0 - 37 U/L   ALT 6  0 - 35 U/L   Alkaline Phosphatase 74  39 - 117 U/L   Total Bilirubin 0.2 (*) 0.3 - 1.2 mg/dL   GFR calc non Af Amer >90  >90 mL/min   GFR calc Af Amer >90  >90 mL/min   Anion gap 13  5 - 15  LIPASE, BLOOD   Collection Time    07/14/14  8:22 AM      Result Value Ref Range   Lipase 22  11 - 59 U/L  AMYLASE   Collection Time    07/14/14  8:22 AM      Result Value Ref Range   Amylase 71  0 - 105 U/L  URINALYSIS, ROUTINE W REFLEX MICROSCOPIC   Collection Time    07/14/14  8:40 AM      Result Value Ref Range   Color, Urine YELLOW  YELLOW   APPearance CLOUDY (*) CLEAR   Specific Gravity, Urine 1.020  1.005 - 1.030   pH 6.5  5.0 - 8.0   Glucose, UA NEGATIVE  NEGATIVE mg/dL   Hgb urine dipstick LARGE (*) NEGATIVE   Bilirubin Urine NEGATIVE  NEGATIVE   Ketones, ur NEGATIVE  NEGATIVE mg/dL   Protein, ur NEGATIVE  NEGATIVE mg/dL   Urobilinogen, UA 0.2  0.0 - 1.0 mg/dL   Nitrite NEGATIVE  NEGATIVE   Leukocytes, UA MODERATE (*) NEGATIVE  URINE MICROSCOPIC-ADD ON   Collection Time    07/14/14  8:40 AM      Result Value Ref Range   Squamous Epithelial / LPF MANY (*) RARE   WBC, UA 7-10  <3 WBC/hpf   RBC / HPF 21-50  <3 RBC/hpf   Bacteria, UA FEW (*) RARE   Urine-Other MUCOUS PRESENT    GC/CHLAMYDIA PROBE AMP   Collection Time    07/14/14 11:06 AM      Result Value Ref Range   CT Probe RNA NEGATIVE  NEGATIVE   GC Probe RNA NEGATIVE  NEGATIVE  WET PREP, GENITAL   Collection Time    07/14/14 11:06 AM      Result Value Ref Range   Yeast Wet Prep HPF POC NONE SEEN  NONE SEEN   Trich, Wet Prep NONE SEEN  NONE SEEN   Clue Cells Wet Prep HPF POC NONE SEEN  NONE SEEN   WBC, Wet Prep HPF POC MODERATE (*) NONE SEEN  URINALYSIS, ROUTINE W REFLEX MICROSCOPIC   Collection Time     07/14/14 12:21 PM      Result Value Ref Range   Color, Urine YELLOW  YELLOW   APPearance CLEAR  CLEAR   Specific Gravity, Urine <1.005 (*) 1.005 - 1.030   pH 5.5  5.0 - 8.0   Glucose, UA NEGATIVE  NEGATIVE mg/dL   Hgb urine dipstick NEGATIVE  NEGATIVE   Bilirubin Urine NEGATIVE  NEGATIVE   Ketones, ur 15 (*) NEGATIVE mg/dL   Protein, ur NEGATIVE  NEGATIVE mg/dL   Urobilinogen, UA 0.2  0.0 - 1.0 mg/dL   Nitrite NEGATIVE  NEGATIVE   Leukocytes, UA NEGATIVE  NEGATIVE   Suspect first UA contaminated. Recollected w/ cath specimen.   Imaging:  US Abdomen Complete  07/14/2014   CLINICAL DATA:  28 year old female with right upper quadrant pain and vomiting, [redacted] weeks pregnant. Initial encounter.  EXAM: ULTRASOUND ABDOMEN COMPLETE  COMPARISON:  Prior OB ultrasounds.  FINDINGS: Gallbladder:  No gallstones or wall thickening visualized. No sonographic Murphy sign noted.  Common bile duct:  Diameter: 3 mm, normal  Liver:  No focal lesion identified. Within normal limits in parenchymal echogenicity.  IVC:  No abnormality visualized.  Pancreas:  Visualized portion unremarkable.  Spleen:  Size and appearance within normal limits.  Right Kidney:  Length: 11.7 cm. Moderate hydronephrosis. Normal cortical echotexture. Dilated proximal right ureter is visible (image 31).  Left Kidney:  Length: 11.7 cm. Echogenicity within normal limits. No mass or hydronephrosis visualized.  Abdominal aorta:  No aneurysm visualized.  Other findings:  None.  IMPRESSION: 1. Hydronephrotic right kidney suspected to be maternal hydronephrosis of pregnancy. Follow-up bladder ultrasound with Doppler evaluation of the ureteral jets may reinforce the diagnosis. 2. Otherwise normal abdomen ultrasound.   Electronically Signed   By: Augusto Gamble M.D.   On: 07/14/2014 10:12   US Ob Limited CL 3.8 cm, Posterior placenta, no previa. AFI 17 cm  MAU Course:   Assessment: 1. Vaginal bleeding in pregnancy, third trimester   2. Gastroenteritis,  acute   3. Friable cervix    Plan: Discharge home instable condition per consult w/ Dr. Gaynell Face. Preterm labor precautions and fetal kick counts Bleeding precautions Pelvic rest x 1 week   Follow-up Information   Call MARSHALL,BERNARD A, MD. (To reschedule your appointment as soon as possible)    Specialty:  Obstetrics and Gynecology   Contact information:   [address] [place] [address] [phone]       Follow up with [facility] MATERN[facility] ADMISS[facility]. (As needed in emergencies)    Contact information:   [address] [id number] [place] [address] [phone]       Medication List         ALPRAZolam 0.5 MG tablet  Commonly known as:  XANAX  Take 0.5 mg by mouth at bedtime as needed for anxiety.     prenatal multivitamin Tabs tablet  Take 1 tablet by mouth daily at 12 noon.     promethazine 25 MG tablet  Commonly  known as:  PHENERGAN  Take 1 tablet (25 mg total) by mouth every 6 (six) hours as needed for nausea or vomiting.     traMADol 50 MG tablet  Commonly known as:  ULTRAM  Take 1-2 tablets (50-100 mg total) by mouth every 6 (six) hours as needed for severe pain.        [place], CNM 07/14/2014 8:20 AM

## 2014-07-14 NOTE — MAU Note (Signed)
Arrived via EMS. C/O upper abdominal pain, intermittent with back pain. Started 0600. States she vomited X 1 at 0715. States she has had spotting X 3 days and was put on bedrest for that. Denies recent intercourse or problems with placenta.

## 2014-07-14 NOTE — MAU Note (Signed)
Urine in lab 

## 2014-07-14 NOTE — Progress Notes (Signed)
Has vomited X 4 since arrival to MAU, once after Phenergan started. States she still feels intermittent pain in upper abd. and mid back. Drowsy from meds. No adverse effects to meds.

## 2014-07-14 NOTE — Discharge Instructions (Signed)
Viral Gastroenteritis °Viral gastroenteritis is also known as stomach flu. This condition affects the stomach and intestinal tract. It can cause sudden diarrhea and vomiting. The illness typically lasts 3 to 8 days. Most people develop an immune response that eventually gets rid of the virus. While this natural response develops, the virus can make you quite ill. °CAUSES  °Many different viruses can cause gastroenteritis, such as rotavirus or noroviruses. You can catch one of these viruses by consuming contaminated food or water. You may also catch a virus by sharing utensils or other personal items with an infected person or by touching a contaminated surface. °SYMPTOMS  °The most common symptoms are diarrhea and vomiting. These problems can cause a severe loss of body fluids (dehydration) and a body salt (electrolyte) imbalance. Other symptoms may include: °· Fever. °· Headache. °· Fatigue. °· Abdominal pain. °DIAGNOSIS  °Your caregiver can usually diagnose viral gastroenteritis based on your symptoms and a physical exam. A stool sample may also be taken to test for the presence of viruses or other infections. °TREATMENT  °This illness typically goes away on its own. Treatments are aimed at rehydration. The most serious cases of viral gastroenteritis involve vomiting so severely that you are not able to keep fluids down. In these cases, fluids must be given through an intravenous line (IV). °HOME CARE INSTRUCTIONS  °· Drink enough fluids to keep your urine clear or pale yellow. Drink small amounts of fluids frequently and increase the amounts as tolerated. °· Ask your caregiver for specific rehydration instructions. °· Avoid: °¨ Foods high in sugar. °¨ Alcohol. °¨ Carbonated drinks. °¨ Tobacco. °¨ Juice. °¨ Caffeine drinks. °¨ Extremely hot or cold fluids. °¨ Fatty, greasy foods. °¨ Too much intake of anything at one time. °¨ Dairy products until 24 to 48 hours after diarrhea stops. °· You may consume probiotics.  Probiotics are active cultures of beneficial bacteria. They may lessen the amount and number of diarrheal stools in adults. Probiotics can be found in yogurt with active cultures and in supplements. °· Wash your hands well to avoid spreading the virus. °· Only take over-the-counter or prescription medicines for pain, discomfort, or fever as directed by your caregiver. Do not give aspirin to children. Antidiarrheal medicines are not recommended. °· Ask your caregiver if you should continue to take your regular prescribed and over-the-counter medicines. °· Keep all follow-up appointments as directed by your caregiver. °SEEK IMMEDIATE MEDICAL CARE IF:  °· You are unable to keep fluids down. °· You do not urinate at least once every 6 to 8 hours. °· You develop shortness of breath. °· You notice blood in your stool or vomit. This may look like coffee grounds. °· You have abdominal pain that increases or is concentrated in one small area (localized). °· You have persistent vomiting or diarrhea. °· You have a fever. °· The patient is a child younger than 3 months, and he or she has a fever. °· The patient is a child older than 3 months, and he or she has a fever and persistent symptoms. °· The patient is a child older than 3 months, and he or she has a fever and symptoms suddenly get worse. °· The patient is a baby, and he or she has no tears when crying. °MAKE SURE YOU:  °· Understand these instructions. °· Will watch your condition. °· Will get help right away if you are not doing well or get worse. °Document Released: 10/02/2005 Document Revised: 12/25/2011 Document Reviewed: 07/19/2011 °  ExitCare Patient Information 2015 [place], [place]. This information is not intended to replace advice given to you by your health care provider. Make sure you discuss any questions you have with your health care provider.  Vaginal Bleeding During Pregnancy, Third Trimester A small amount of bleeding (spotting) from the vagina is  relatively common in pregnancy. Various things can cause bleeding or spotting in pregnancy. Sometimes the bleeding is normal and is not a problem. However, bleeding during the third trimester can also be a sign of something serious for the mother and the baby. Be sure to tell your health care provider about any vaginal bleeding right away.  Some possible causes of vaginal bleeding during the third trimester include:   The placenta may be partially or completely covering the opening to the cervix (placenta previa).   The placenta may have separated from the uterus (abruption of the placenta).   There may be an infection or growth on the cervix.   You may be starting labor, called discharging of the mucus plug.   The placenta may grow into the muscle layer of the uterus (placenta accreta).  HOME CARE INSTRUCTIONS  Watch your condition for any changes. The following actions may help to lessen any discomfort you are feeling:   Follow your health care provider's instructions for limiting your activity. If your health care provider orders bed rest, you may need to stay in bed and only get up to use the bathroom. However, your health care provider may allow you to continue light activity.  If needed, make plans for someone to help with your regular activities and responsibilities while you are on bed rest.  Keep track of the number of pads you use each day, how often you change pads, and how soaked (saturated) they are. Write this down.  Do not use tampons. Do not douche.  Do not have sexual intercourse or orgasms until approved by your health care provider.  Follow your health care provider's advice about lifting, driving, and physical activities.  If you pass any tissue from your vagina, save the tissue so you can show it to your health care provider.   Only take over-the-counter or prescription medicines as directed by your health care provider.  Do not take aspirin because it can  make you bleed.   Keep all follow-up appointments as directed by your health care provider. SEEK MEDICAL CARE IF:  You have any vaginal bleeding during any part of your pregnancy.  You have cramps or labor pains.  You have a fever, not controlled by medicine. SEEK IMMEDIATE MEDICAL CARE IF:   You have severe cramps or pain in your back or belly (abdomen).  You have chills.  You have a gush of fluid from the vagina.  You pass large clots or tissue from your vagina.  Your bleeding increases.  You feel light-headed or weak.  You pass out.  You feel less movement or no movement of the baby.  MAKE SURE YOU:  Understand these instructions.  Will watch your condition.  Will get help right away if you are not doing well or get worse. Document Released: 12/23/2002 Document Revised: 10/07/2013 Document Reviewed: 06/09/2013 [facility] Patient Information 2015 [place], [place]. This information is not intended to replace advice given to you by your health care provider. Make sure you discuss any questions you have with your health care provider.

## 2014-07-15 LAB — GC/CHLAMYDIA PROBE AMP
CT Probe RNA: NEGATIVE
GC Probe RNA: NEGATIVE

## 2014-08-11 LAB — OB RESULTS CONSOLE GBS: STREP GROUP B AG: NEGATIVE

## 2014-08-11 LAB — OB RESULTS CONSOLE GC/CHLAMYDIA
Chlamydia: NEGATIVE
Gonorrhea: NEGATIVE

## 2014-08-17 ENCOUNTER — Encounter (HOSPITAL_COMMUNITY): Payer: Self-pay | Admitting: *Deleted

## 2014-08-18 ENCOUNTER — Encounter (HOSPITAL_COMMUNITY): Payer: Self-pay | Admitting: Medical

## 2014-08-18 ENCOUNTER — Inpatient Hospital Stay (HOSPITAL_COMMUNITY)
Admission: AD | Admit: 2014-08-18 | Discharge: 2014-08-19 | Disposition: A | Discharge status: Home / Self Care | Payer: Medicaid Other | Source: Ambulatory Visit | Attending: Obstetrics | Admitting: Obstetrics

## 2014-08-18 DIAGNOSIS — Z3A34 34 weeks gestation of pregnancy: Secondary | ICD-10-CM | POA: Insufficient documentation

## 2014-08-18 DIAGNOSIS — O4693 Antepartum hemorrhage, unspecified, third trimester: Secondary | ICD-10-CM | POA: Insufficient documentation

## 2014-08-18 DIAGNOSIS — O4690 Antepartum hemorrhage, unspecified, unspecified trimester: Secondary | ICD-10-CM | POA: Insufficient documentation

## 2014-08-18 LAB — URINALYSIS, ROUTINE W REFLEX MICROSCOPIC
Bilirubin Urine: NEGATIVE
Glucose, UA: NEGATIVE mg/dL
Ketones, ur: NEGATIVE mg/dL
NITRITE: NEGATIVE
Protein, ur: NEGATIVE mg/dL
SPECIFIC GRAVITY, URINE: 1.025 (ref 1.005–1.030)
UROBILINOGEN UA: 1 mg/dL (ref 0.0–1.0)
pH: 6 (ref 5.0–8.0)

## 2014-08-18 LAB — URINE MICROSCOPIC-ADD ON

## 2014-08-18 NOTE — MAU Provider Note (Signed)
History     CSN: [id number]  Arrival date and time: 08/18/14 2236   First Provider Initiated Contact with Patient 08/18/14 2347      Chief Complaint  Patient presents with  . Contractions   HPI  [name] is a 28 y.o. [id number] at 8071w5d who presents to MAU today with complaint of a gush of vaginal bleeding with clots around 2200. She states that since arrival in MAU she has noted some mild suprapubic cramping. She states occasional, mild contractions. She reports good fetal movement. US on 07/14/14 showed normal placenta. Patient denies complications with this pregnancy. Patient states a history of PTD with one previous pregnancy, per patient unknown cause.    OB History    Gravida Para Term Preterm AB TAB SAB Ectopic Multiple Living   6 4 3 1 1  0 1 0 0 4      Past Medical History  Diagnosis Date  . Anemia   . Headache(784.0)   . Urinary tract infection   . Pregnancy induced hypertension   . Hx of chlamydia infection   . Depression     ppd with first child    Past Surgical History  Procedure Laterality Date  . No past surgeries      Family History  Problem Relation Age of Onset  . Anesthesia problems Neg Hx   . Hypotension Neg Hx   . Malignant hyperthermia Neg Hx   . Pseudochol deficiency Neg Hx   . Other Neg Hx   . Cancer Neg Hx   . Heart disease Neg Hx   . Stroke Neg Hx   . Hearing loss Neg Hx   . Asthma Son   . Asthma Paternal Aunt     History  Substance Use Topics  . Smoking status: Never Smoker   . Smokeless tobacco: Never Used  . Alcohol Use: No    Allergies: No Known Allergies  Prescriptions prior to admission  Medication Sig Dispense Refill Last Dose  . ALPRAZolam (XANAX) 0.5 MG tablet Take 0.5 mg by mouth at bedtime as needed for anxiety.   Past Week at Unknown time  . Prenatal Vit-Fe Fumarate-FA (PRENATAL MULTIVITAMIN) TABS tablet Take 1 tablet by mouth daily at 12 noon.   08/18/2014 at Unknown time  . promethazine (PHENERGAN) 25 MG  tablet Take 1 tablet (25 mg total) by mouth every 6 (six) hours as needed for nausea. 10 tablet 0 Past Month at Unknown  . promethazine (PHENERGAN) 25 MG tablet Take 1 tablet (25 mg total) by mouth every 6 (six) hours as needed for nausea or vomiting. 30 tablet 0   . traMADol (ULTRAM) 50 MG tablet Take 1-2 tablets (50-100 mg total) by mouth every 6 (six) hours as needed for severe pain. 30 tablet 0     Review of Systems  Constitutional: Negative for fever and malaise/fatigue.  Gastrointestinal: Positive for abdominal pain. Negative for nausea and vomiting.  Genitourinary: Negative for dysuria, urgency and frequency.       + vaginal bleeding   Physical Exam   Blood pressure 117/76, pulse 84, temperature 97.9 F (36.6 C), temperature source Oral, resp. rate 18, height 5\' 1"  (1.549 m), weight 112 lb 3.2 oz (50.894 kg), last menstrual period 12/13/2011, unknown if currently breastfeeding.  Physical Exam  Constitutional: She is oriented to person, place, and time. She appears well-developed and well-nourished. No distress.  HENT:  Head: Normocephalic.  Cardiovascular: Normal rate.   Respiratory: Effort normal.  GI: Soft. She  exhibits no distension and no mass. There is tenderness (mild tenderness to palpation of the suprapubic region). There is no rebound and no guarding.  Visible and palpable fetal movement  Genitourinary: Cervix exhibits friability (bleeding with wet prep collection). Cervix exhibits no motion tenderness and no discharge. There is bleeding (scant) in the vagina. Vaginal discharge (small amount of frothy yellow-brown discharge noted) found.  Neurological: She is alert and oriented to person, place, and time.  Skin: Skin is warm and dry. No erythema.  Psychiatric: She has a normal mood and affect.  Dilation: 2 Effacement (%): Thick Exam by:: Raynelle FanningJulie, PA  Results for orders placed or performed during the hospital encounter of 08/18/14 (from the past 24 hour(s))  Urinalysis,  Routine w reflex microscopic     Status: Abnormal   Collection Time: 08/18/14 11:05 PM  Result Value Ref Range   Color, Urine YELLOW YELLOW   APPearance CLEAR CLEAR   Specific Gravity, Urine 1.025 1.005 - 1.030   pH 6.0 5.0 - 8.0   Glucose, UA NEGATIVE NEGATIVE mg/dL   Hgb urine dipstick MODERATE (A) NEGATIVE   Bilirubin Urine NEGATIVE NEGATIVE   Ketones, ur NEGATIVE NEGATIVE mg/dL   Protein, ur NEGATIVE NEGATIVE mg/dL   Urobilinogen, UA 1.0 0.0 - 1.0 mg/dL   Nitrite NEGATIVE NEGATIVE   Leukocytes, UA TRACE (A) NEGATIVE  Urine microscopic-add on     Status: Abnormal   Collection Time: 08/18/14 11:05 PM  Result Value Ref Range   Squamous Epithelial / LPF FEW (A) RARE   WBC, UA 3-6 <3 WBC/hpf   RBC / HPF 0-2 <3 RBC/hpf   Bacteria, UA FEW (A) RARE  Wet prep, genital     Status: Abnormal   Collection Time: 08/19/14 12:07 AM  Result Value Ref Range   Yeast Wet Prep HPF POC NONE SEEN NONE SEEN   Trich, Wet Prep NONE SEEN NONE SEEN   Clue Cells Wet Prep HPF POC NONE SEEN NONE SEEN   WBC, Wet Prep HPF POC MODERATE (A) NONE SEEN  Amnisure rupture of membrane (rom)     Status: None   Collection Time: 08/19/14 12:24 AM  Result Value Ref Range   Amnisure ROM NEGATIVE    Fetal Monitoring: Baseline: 120 bpm, moderate variability, + accelerations, no decelerations Contractions: occasional irregular  MAU Course  Procedures None  MDM UA today Fern - negative Amnisure and wet prep US ordered - preliminary reports shows normal AFI, cervical length and placenta 0145 - discussed patient presentation, history, labs and US results with Dr. Gaynell FaceMarshall. Ok for discharge at this time. Follow-up as scheduled.   Assessment and Plan  A: SIUP at 3052w5d Vaginal bleeding in pregnancy, third trimester  P: Discharge home Bleeding and labor precautions discussed Patient advised to follow-up with Dr. Gaynell FaceMarshall as scheduled or sooner if symptoms worsen Patient may return to MAU as needed or if her  condition were to change or worsen  Margaret LowensteinJulie N Shadee Montoya, PA-C  08/19/2014, 1:50 AM

## 2014-08-18 NOTE — MAU Note (Signed)
Pt reports she having some bleeding and clots tonight and then ctx started  In the lobby.  Good fetal movement reported. Denies placenta previa or other bleeding problems.

## 2014-08-19 ENCOUNTER — Inpatient Hospital Stay (HOSPITAL_COMMUNITY): Payer: Medicaid Other

## 2014-08-19 DIAGNOSIS — O4693 Antepartum hemorrhage, unspecified, third trimester: Secondary | ICD-10-CM

## 2014-08-19 DIAGNOSIS — Z3A34 34 weeks gestation of pregnancy: Secondary | ICD-10-CM | POA: Insufficient documentation

## 2014-08-19 DIAGNOSIS — O4690 Antepartum hemorrhage, unspecified, unspecified trimester: Secondary | ICD-10-CM | POA: Insufficient documentation

## 2014-08-19 LAB — WET PREP, GENITAL
Clue Cells Wet Prep HPF POC: NONE SEEN
Trich, Wet Prep: NONE SEEN
YEAST WET PREP: NONE SEEN

## 2014-08-19 LAB — AMNISURE RUPTURE OF MEMBRANE (ROM) NOT AT ARMC: AMNISURE: NEGATIVE

## 2014-08-19 NOTE — MAU Note (Signed)
An After Visit Summary was printed and given to the patient. Pt verbalizes understanding.  

## 2014-08-19 NOTE — Discharge Instructions (Signed)
Pelvic Rest °Pelvic rest is sometimes recommended for women when:  °· The placenta is partially or completely covering the opening of the cervix (placenta previa). °· There is bleeding between the uterine wall and the amniotic sac in the first trimester (subchorionic hemorrhage). °· The cervix begins to open without labor starting (incompetent cervix, cervical insufficiency). °· The labor is too early (preterm labor). °HOME CARE INSTRUCTIONS °· Do not have sexual intercourse, stimulation, or an orgasm. °· Do not use tampons, douche, or put anything in the vagina. °· Do not lift anything over 10 pounds (4.5 kg). °· Avoid strenuous activity or straining your pelvic muscles. °SEEK MEDICAL CARE IF:  °· You have any vaginal bleeding during pregnancy. Treat this as a potential emergency. °· You have cramping pain felt low in the stomach (stronger than menstrual cramps). °· You notice vaginal discharge (watery, mucus, or bloody). °· You have a low, dull backache. °· There are regular contractions or uterine tightening. °SEEK IMMEDIATE MEDICAL CARE IF: °You have vaginal bleeding and have placenta previa.  °Document Released: 01/27/2011 Document Revised: 12/25/2011 Document Reviewed: 01/27/2011 °ExitCare® Patient Information ©2015 [employer], [employer]. This information is not intended to replace advice given to you by your health care provider. Make sure you discuss any questions you have with your health care provider. ° °Vaginal Bleeding During Pregnancy, Third Trimester °A small amount of bleeding (spotting) from the vagina is relatively common in pregnancy. Various things can cause bleeding or spotting in pregnancy. Sometimes the bleeding is normal and is not a problem. However, bleeding during the third trimester can also be a sign of something serious for the mother and the baby. Be sure to tell your health care provider about any vaginal bleeding right away.  °Some possible causes of vaginal bleeding during the third  trimester include:  °· The placenta may be partially or completely covering the opening to the cervix (placenta previa).   °· The placenta may have separated from the uterus (abruption of the placenta).   °· There may be an infection or growth on the cervix.   °· You may be starting labor, called discharging of the mucus plug.   °· The placenta may grow into the muscle layer of the uterus (placenta accreta).   °HOME CARE INSTRUCTIONS  °Watch your condition for any changes. The following actions may help to lessen any discomfort you are feeling:  °· Follow your health care provider's instructions for limiting your activity. If your health care provider orders bed rest, you may need to stay in bed and only get up to use the bathroom. However, your health care provider may allow you to continue light activity. °· If needed, make plans for someone to help with your regular activities and responsibilities while you are on bed rest. °· Keep track of the number of pads you use each day, how often you change pads, and how soaked (saturated) they are. Write this down. °· Do not use tampons. Do not douche. °· Do not have sexual intercourse or orgasms until approved by your health care provider. °· Follow your health care provider's advice about lifting, driving, and physical activities. °· If you pass any tissue from your vagina, save the tissue so you can show it to your health care provider.   °· Only take over-the-counter or prescription medicines as directed by your health care provider. °· Do not take aspirin because it can make you bleed.   °· Keep all follow-up appointments as directed by your health care provider. °SEEK MEDICAL CARE IF: °·   You have any vaginal bleeding during any part of your pregnancy. °· You have cramps or labor pains. °· You have a fever, not controlled by medicine. °SEEK IMMEDIATE MEDICAL CARE IF:  °· You have severe cramps or pain in your back or belly (abdomen). °· You have chills. °· You have a  gush of fluid from the vagina. °· You pass large clots or tissue from your vagina. °· Your bleeding increases. °· You feel light-headed or weak. °· You pass out. °· You feel less movement or no movement of the baby.   °MAKE SURE YOU: °· Understand these instructions. °· Will watch your condition. °· Will get help right away if you are not doing well or get worse. °Document Released: 12/23/2002 Document Revised: 10/07/2013 Document Reviewed: 06/09/2013 °ExitCare® Patient Information ©2015 [employer], LLC. This information is not intended to replace advice given to you by your health care provider. Make sure you discuss any questions you have with your health care provider. ° °

## 2014-09-02 ENCOUNTER — Observation Stay (HOSPITAL_COMMUNITY)
Admission: AD | Admit: 2014-09-02 | Discharge: 2014-09-02 | Disposition: A | Discharge status: Home / Self Care | Payer: Medicaid Other | Source: Ambulatory Visit | Attending: Obstetrics | Admitting: Obstetrics

## 2014-09-02 ENCOUNTER — Inpatient Hospital Stay (HOSPITAL_COMMUNITY): Payer: Medicaid Other

## 2014-09-02 ENCOUNTER — Encounter (HOSPITAL_COMMUNITY): Payer: Self-pay | Admitting: *Deleted

## 2014-09-02 DIAGNOSIS — Z3A36 36 weeks gestation of pregnancy: Secondary | ICD-10-CM | POA: Insufficient documentation

## 2014-09-02 DIAGNOSIS — O368390 Maternal care for abnormalities of the fetal heart rate or rhythm, unspecified trimester, not applicable or unspecified: Secondary | ICD-10-CM | POA: Insufficient documentation

## 2014-09-02 LAB — AMNISURE RUPTURE OF MEMBRANE (ROM) NOT AT ARMC: Amnisure ROM: NEGATIVE

## 2014-09-02 LAB — WET PREP, GENITAL: Trich, Wet Prep: NONE SEEN

## 2014-09-02 MED ORDER — FLUCONAZOLE 150 MG PO TABS: 150.0000 mg | ORAL_TABLET | Freq: Every day | ORAL | Status: DC

## 2014-09-02 MED ORDER — ACETAMINOPHEN 500 MG PO TABS
1000.0000 mg | ORAL_TABLET | Freq: Once | ORAL | Status: AC
  Administered 2014-09-02: 1000 mg via ORAL
  Filled 2014-09-02: qty 2

## 2014-09-02 MED ORDER — PRENATAL MULTIVITAMIN CH: 1.0000 | ORAL_TABLET | Freq: Every day | ORAL | Status: DC

## 2014-09-02 NOTE — Progress Notes (Signed)
Dr Gaynell FaceMarshall called and stated he had reviewed EFM strip from MAU and there were only a couple of variable decels.  Order to monitor and then D/C home.

## 2014-09-02 NOTE — MAU Note (Signed)
C/o ?SROM @ 0830 this AM;

## 2014-09-02 NOTE — Progress Notes (Signed)
Pt d/c'd home with instructions states good understanding. ambulating family driving

## 2014-09-02 NOTE — MAU Provider Note (Signed)
Subjective:  [name] is a 28 y.o. female 4436w5Rosalie Cain who presents with possible ROM  Gush of white cloudy discharge twice. It has continued to leak while here in MAU. The patient arrived by EMS  Objective:  GENERAL: Well-developed, well-nourished female in no acute distress.  HEENT: Normocephalic, atraumatic.   LUNGS: Effort normal HEART: Regular rate  SKIN: Warm, dry and without erythema PSYCH: Normal mood and affect  Filed Vitals:   09/02/14 0950  BP: 121/70  Pulse: 84  Temp: 97.7 F (36.5 C)   Results for orders placed or performed during the hospital encounter of 09/02/14 (from the past 48 hour(s))  Wet prep, genital     Status: Abnormal   Collection Time: 09/02/14 10:50 AM  Result Value Ref Range   Yeast Wet Prep HPF POC MODERATE (A) NONE SEEN   Trich, Wet Prep NONE SEEN NONE SEEN   Clue Cells Wet Prep HPF POC FEW (A) NONE SEEN   WBC, Wet Prep HPF POC MANY (A) NONE SEEN    Comment: MANY BACTERIA SEEN  Amnisure rupture of membrane (rom)     Status: None   Collection Time: 09/02/14 10:50 AM  Result Value Ref Range   Amnisure ROM NEGATIVE    Dilation: 2 Effacement (%): 50 Station: -3 Exam by:: Jerrye BushyJ Rausch NP   Fetal Tracing: 1400 Baseline: 125 bpm  Variability: Moderate  Accelerations: 15x15 Decelerations: none  1000: Variable decel down to the 50's with quick recovery back to baseline.  Toco: Q2-3   MDM: Diflucan BPP 1345: Discussed fetal tracing and US results with Dr. Gaynell FaceMarshall; requested him to review tracing BPP 6/8; plan to admit to [facility] for further fetal monitoring 1349: notified RN of plan of care; asked RN to recheck patients cervix due to contraction pattern. Per the RN patient has become more uncomfortable with contractions.  Unchanged cervical exam per RN.   Assessment: 1. Variable fetal heart rate decelerations, antepartum    Plan: Admit to [facility] for prolonged fetal monitoring per Dr. Leonie GreenMarshall     Margaret Asleson Irene Cristino Degroff,  NP 09/02/2014 10:40 AM

## 2014-09-08 ENCOUNTER — Inpatient Hospital Stay (HOSPITAL_COMMUNITY)
Admission: AD | Admit: 2014-09-08 | Discharge: 2014-09-10 | DRG: 775 | Disposition: A | Discharge status: Home / Self Care | Payer: Medicaid Other | Source: Ambulatory Visit | Attending: Obstetrics | Admitting: Obstetrics

## 2014-09-08 ENCOUNTER — Encounter (HOSPITAL_COMMUNITY): Payer: Self-pay | Admitting: *Deleted

## 2014-09-08 DIAGNOSIS — Z23 Encounter for immunization: Secondary | ICD-10-CM

## 2014-09-08 DIAGNOSIS — Z3490 Encounter for supervision of normal pregnancy, unspecified, unspecified trimester: Secondary | ICD-10-CM

## 2014-09-08 DIAGNOSIS — Z3A37 37 weeks gestation of pregnancy: Secondary | ICD-10-CM | POA: Diagnosis present

## 2014-09-08 LAB — TYPE AND SCREEN
ABO/RH(D): O POS
Antibody Screen: NEGATIVE

## 2014-09-08 LAB — CBC
HEMATOCRIT: 30.4 % — AB (ref 36.0–46.0)
HEMOGLOBIN: 9.9 g/dL — AB (ref 12.0–15.0)
MCH: 27.7 pg (ref 26.0–34.0)
MCHC: 32.6 g/dL (ref 30.0–36.0)
MCV: 85.2 fL (ref 78.0–100.0)
Platelets: 153 10*3/uL (ref 150–400)
RBC: 3.57 MIL/uL — ABNORMAL LOW (ref 3.87–5.11)
RDW: 13.2 % (ref 11.5–15.5)
WBC: 7.3 10*3/uL (ref 4.0–10.5)

## 2014-09-08 LAB — RPR

## 2014-09-08 MED ORDER — INFLUENZA VAC SPLIT QUAD 0.5 ML IM SUSY
0.5000 mL | PREFILLED_SYRINGE | INTRAMUSCULAR | Status: AC
  Administered 2014-09-09: 0.5 mL via INTRAMUSCULAR
  Filled 2014-09-08: qty 0.5

## 2014-09-08 MED ORDER — OXYCODONE-ACETAMINOPHEN 5-325 MG PO TABS
1.0000 | ORAL_TABLET | ORAL | Status: DC | PRN
  Administered 2014-09-08: 1 via ORAL
  Filled 2014-09-08 (×5): qty 1

## 2014-09-08 MED ORDER — OXYCODONE-ACETAMINOPHEN 5-325 MG PO TABS: 2.0000 | ORAL_TABLET | ORAL | Status: DC | PRN

## 2014-09-08 MED ORDER — BENZOCAINE-MENTHOL 20-0.5 % EX AERO
1.0000 "application " | INHALATION_SPRAY | CUTANEOUS | Status: DC | PRN
  Administered 2014-09-08: 1 via TOPICAL
  Filled 2014-09-08 (×2): qty 56

## 2014-09-08 MED ORDER — OXYTOCIN 40 UNITS IN LACTATED RINGERS INFUSION - SIMPLE MED
62.5000 mL/h | INTRAVENOUS | Status: DC
  Administered 2014-09-08: 62.5 mL/h via INTRAVENOUS

## 2014-09-08 MED ORDER — ACETAMINOPHEN 325 MG PO TABS: 650.0000 mg | ORAL_TABLET | ORAL | Status: DC | PRN

## 2014-09-08 MED ORDER — OXYTOCIN BOLUS FROM INFUSION
500.0000 mL | INTRAVENOUS | Status: DC
  Administered 2014-09-08: 500 mL via INTRAVENOUS

## 2014-09-08 MED ORDER — PRENATAL MULTIVITAMIN CH
1.0000 | ORAL_TABLET | Freq: Every day | ORAL | Status: DC
  Administered 2014-09-08 – 2014-09-10 (×3): 1 via ORAL
  Filled 2014-09-08 (×3): qty 1

## 2014-09-08 MED ORDER — FLEET ENEMA 7-19 GM/118ML RE ENEM: 1.0000 | ENEMA | RECTAL | Status: DC | PRN

## 2014-09-08 MED ORDER — ONDANSETRON HCL 4 MG PO TABS
4.0000 mg | ORAL_TABLET | ORAL | Status: DC | PRN
  Administered 2014-09-09: 4 mg via ORAL
  Filled 2014-09-08: qty 1

## 2014-09-08 MED ORDER — LACTATED RINGERS IV SOLN
INTRAVENOUS | Status: DC
  Administered 2014-09-08: 07:00:00 via INTRAVENOUS

## 2014-09-08 MED ORDER — LIDOCAINE HCL (PF) 1 % IJ SOLN: 30.0000 mL | INTRAMUSCULAR | Status: DC | PRN

## 2014-09-08 MED ORDER — ONDANSETRON HCL 4 MG/2ML IJ SOLN: 4.0000 mg | INTRAMUSCULAR | Status: DC | PRN

## 2014-09-08 MED ORDER — ONDANSETRON HCL 4 MG/2ML IJ SOLN: 4.0000 mg | Freq: Four times a day (QID) | INTRAMUSCULAR | Status: DC | PRN

## 2014-09-08 MED ORDER — SIMETHICONE 80 MG PO CHEW: 80.0000 mg | CHEWABLE_TABLET | ORAL | Status: DC | PRN

## 2014-09-08 MED ORDER — DIPHENHYDRAMINE HCL 25 MG PO CAPS: 25.0000 mg | ORAL_CAPSULE | Freq: Four times a day (QID) | ORAL | Status: DC | PRN

## 2014-09-08 MED ORDER — LIDOCAINE HCL (PF) 1 % IJ SOLN
INTRAMUSCULAR | Status: AC
  Filled 2014-09-08: qty 30

## 2014-09-08 MED ORDER — IBUPROFEN 600 MG PO TABS
600.0000 mg | ORAL_TABLET | Freq: Once | ORAL | Status: AC
  Administered 2014-09-08: 600 mg via ORAL
  Filled 2014-09-08: qty 1

## 2014-09-08 MED ORDER — WITCH HAZEL-GLYCERIN EX PADS: 1.0000 "application " | MEDICATED_PAD | CUTANEOUS | Status: DC | PRN

## 2014-09-08 MED ORDER — CITRIC ACID-SODIUM CITRATE 334-500 MG/5ML PO SOLN: 30.0000 mL | ORAL | Status: DC | PRN

## 2014-09-08 MED ORDER — OXYTOCIN 40 UNITS IN LACTATED RINGERS INFUSION - SIMPLE MED
INTRAVENOUS | Status: AC
  Filled 2014-09-08: qty 1000

## 2014-09-08 MED ORDER — TETANUS-DIPHTH-ACELL PERTUSSIS 5-2.5-18.5 LF-MCG/0.5 IM SUSP
0.5000 mL | Freq: Once | INTRAMUSCULAR | Status: AC
  Administered 2014-09-09: 0.5 mL via INTRAMUSCULAR
  Filled 2014-09-08: qty 0.5

## 2014-09-08 MED ORDER — LANOLIN HYDROUS EX OINT: TOPICAL_OINTMENT | CUTANEOUS | Status: DC | PRN

## 2014-09-08 MED ORDER — SENNOSIDES-DOCUSATE SODIUM 8.6-50 MG PO TABS
2.0000 | ORAL_TABLET | ORAL | Status: DC
  Administered 2014-09-09 – 2014-09-10 (×2): 2 via ORAL
  Filled 2014-09-08 (×2): qty 2

## 2014-09-08 MED ORDER — LACTATED RINGERS IV SOLN: 500.0000 mL | INTRAVENOUS | Status: DC | PRN

## 2014-09-08 MED ORDER — DIBUCAINE 1 % RE OINT
1.0000 "application " | TOPICAL_OINTMENT | RECTAL | Status: DC | PRN
  Filled 2014-09-08: qty 28

## 2014-09-08 MED ORDER — OXYCODONE-ACETAMINOPHEN 5-325 MG PO TABS
1.0000 | ORAL_TABLET | ORAL | Status: DC | PRN
  Administered 2014-09-08 – 2014-09-10 (×3): 1 via ORAL

## 2014-09-08 MED ORDER — IBUPROFEN 600 MG PO TABS
600.0000 mg | ORAL_TABLET | Freq: Four times a day (QID) | ORAL | Status: DC
  Administered 2014-09-08 – 2014-09-10 (×9): 600 mg via ORAL
  Filled 2014-09-08 (×9): qty 1

## 2014-09-08 MED ORDER — ZOLPIDEM TARTRATE 5 MG PO TABS: 5.0000 mg | ORAL_TABLET | Freq: Every evening | ORAL | Status: DC | PRN

## 2014-09-08 MED ORDER — FERROUS SULFATE 325 (65 FE) MG PO TABS
325.0000 mg | ORAL_TABLET | Freq: Two times a day (BID) | ORAL | Status: DC
Start: 2014-09-08 — End: 2014-09-10
  Administered 2014-09-08 – 2014-09-10 (×4): 325 mg via ORAL
  Filled 2014-09-08 (×5): qty 1

## 2014-09-08 NOTE — H&P (Signed)
This is Dr. Francoise CeoBernard Marshall dictating the history and physical on  Margaret Cain  she's a 28 year old gravida 6 para 4115 at 37 weeks and 4 days negative GBS admitted 7 cm dilated by the time she got to labor and delivery she was fully dilated amniotomy performed the fluid was clear showed a normal vaginal delivery of a female Apgar 2089 placenta was spontaneous intact she had no episiotomy or laceration past medical history no Past medical history negative Past surgical history negative Social history negative System review negative Physical exam well-developed female in no distress HEENT negative Lungs clear to P&A Breasts negative Heart regular rhythm no murmurs no gallops Abdomen uterus 20 week postpartum Extremities negative

## 2014-09-08 NOTE — Plan of Care (Signed)
Problem: Phase II Progression Outcomes Goal: Progress activity as tolerated unless otherwise ordered Outcome: Completed/Met Date Met:  09/08/14 Goal: Tolerating diet Outcome: Completed/Met Date Met:  09/08/14

## 2014-09-08 NOTE — Progress Notes (Signed)
UR chart review completed.  

## 2014-09-08 NOTE — Plan of Care (Signed)
Problem: Phase I Progression Outcomes Goal: Pain controlled with appropriate interventions Outcome: Completed/Met Date Met:  09/08/14 Goal: Voiding adequately Outcome: Completed/Met Date Met:  09/08/14 Goal: OOB as tolerated unless otherwise ordered Outcome: Completed/Met Date Met:  09/08/14 Goal: VS, stable, temp < 100.4 degrees F Outcome: Completed/Met Date Met:  09/08/14 Goal: Initial discharge plan identified Outcome: Completed/Met Date Met:  09/08/14

## 2014-09-09 LAB — CBC
HCT: 26.5 % — ABNORMAL LOW (ref 36.0–46.0)
Hemoglobin: 8.7 g/dL — ABNORMAL LOW (ref 12.0–15.0)
MCH: 28.1 pg (ref 26.0–34.0)
MCHC: 32.8 g/dL (ref 30.0–36.0)
MCV: 85.5 fL (ref 78.0–100.0)
PLATELETS: 133 10*3/uL — AB (ref 150–400)
RBC: 3.1 MIL/uL — ABNORMAL LOW (ref 3.87–5.11)
RDW: 13.2 % (ref 11.5–15.5)
WBC: 8 10*3/uL (ref 4.0–10.5)

## 2014-09-09 NOTE — Progress Notes (Signed)
Patient ID: Margaret GumsMeghan M Castilla, female   DOB: 10/08/1986, 28 y.o.   MRN: [id number] Postpartum day one Vital signs normal Fundus firm Lochia moderate Doing well

## 2014-09-09 NOTE — Progress Notes (Signed)
Clinical Social Work Department BRIEF PSYCHOSOCIAL ASSESSMENT 09/09/2014  Patient:  Margaret Cain,Margaret Cain     Account Number:  [id number]     Admit date:  09/08/2014  Clinical Social Worker:  Burwell Bethel, CLINICAL SOCIAL WORKER  Date/Time:  09/09/2014 10:45 AM  Referred by:  RN  Date Referred:  09/08/2014 Referred for  Other - See comment History of Postpartum depression   Other Referral:   Per MOB request.   Interview type:  Patient Other interview type:    PSYCHOSOCIAL DATA Living Status:  FAMILY Admitted from facility:   Level of care:   Primary support name:  Margaret Cain Primary support relationship to patient:  PARTNER Degree of support available:   MOB lives at separate residence as the FOB.  She reported that she is also well supported by her mother and other family members.    CURRENT CONCERNS Current Concerns  History of Postpartum depression     SOCIAL WORK ASSESSMENT / PLAN CSW met with the MOB due to history of postpartum depression and due to MOB request.  MOB was easily engaged and was receptive to the visit.  She displayed a full range in affect and presented in a pleasant mood.  MOB did not present with any acute mental health symptoms and was observed to be attentive to the baby.  MOB openly discussed the excitement she feels upon the birth of her baby, Margaret Cain.  She shared belief that she is well supported by her family and friends.  Per the MOB, she live alone with her 4 other children.  MOB minimized any feelings of stress secondary to raising children on her own.  MOB requested bundle of clothing since she has few articles of clothing.  CSW inquired about remaining baby supplies. Per MOB, her family is assisting her to secure remaining items.  CSW noted that the MOB requests bundle of clothing from the CSW after each baby is born at [facility].   MOB acknowledged history of postpartum depression.  She stated that it occurred after her oldest child  was born in 2005.  MOB denied any other history of depression and postpartum depression.  She denied questions or concerns related to postpartum depression, and agreed to contact her MD if she experiences symptoms.   No barriers to discharge.   Assessment/plan status:  No Further Intervention Required Other assessment/ plan:   CSW provided MOB with bundle of clothing.  CSW to follow-up PRN.   Information/referral to community resources:   None needed.    PATIENT'S/FAMILY'S RESPONSE TO PLAN OF CARE: MOB expressed appreciation for the visit and bundle of clothing.         

## 2014-09-10 NOTE — Plan of Care (Signed)
Problem: Consults Goal: Postpartum Patient Education (See Patient Education module for education specifics.)  Outcome: Completed/Met Date Met:  09/10/14

## 2014-09-10 NOTE — Progress Notes (Signed)
Patient ID: Margaret GumsMeghan M Cain, female   DOB: 11/04/1985, 28 y.o.   MRN: [id number] Postpartum day 2 Vital signs normal Fundus firm Lochia moderate Legs negative doing well home today

## 2014-09-10 NOTE — Discharge Summary (Signed)
Obstetric Discharge Summary Reason for Admission: onset of labor Prenatal Procedures: none Intrapartum Procedures: spontaneous vaginal delivery Postpartum Procedures: none Complications-Operative and Postpartum: none HEMOGLOBIN  Date Value Ref Range Status  09/09/2014 8.7* 12.0 - 15.0 g/dL Final   HCT  Date Value Ref Range Status  09/09/2014 26.5* 36.0 - 46.0 % Final    Physical Exam:  General: alert Lochia: appropriate Uterine Fundus: firm Incision: healing well DVT Evaluation: No evidence of DVT seen on physical exam.  Discharge Diagnoses: Term Pregnancy-delivered  Discharge Information: Date: 09/10/2014 Activity: pelvic rest Diet: routine Medications: Percocet Condition: improved Instructions: refer to practice specific booklet Discharge to: home Follow-up Information    Follow up with Margaret Cain,[name], MD.   Specialty:  Obstetrics and Gynecology   Contact information:   [address] [place] [address] [phone]       Newborn Data: Live born female  Birth Weight: 6 lb 15.3 oz (3155 g) APGAR: 8, 9  Home with mother.  Margaret Cain Cain 09/10/2014, 6:56 AM

## 2014-09-10 NOTE — Discharge Instructions (Signed)
Discharge instructions ° °· You can wash your hair °· Shower °· Eat what you want °· Drink what you want °· See me in 6 weeks °· Your ankles are going to swell more in the next 2 weeks than when pregnant °· No sex for 6 weeks ° ° °Alejo Beamer A, MD 09/10/2014 ° ° °

## 2014-09-10 NOTE — Plan of Care (Signed)
Problem: Discharge Progression Outcomes Goal: Activity appropriate for discharge plan Outcome: Completed/Met Date Met:  09/10/14

## 2014-10-20 ENCOUNTER — Other Ambulatory Visit: Payer: Self-pay | Admitting: Obstetrics

## 2014-10-20 LAB — PROCEDURE REPORT - SCANNED: Pap: ABNORMAL — AB

## 2014-10-29 NOTE — H&P (Signed)
NAMVladimir Cain:  Holtzer, Peggie               ACCOUNT NO.:  [id number]  MEDICAL RECORD NO.:  [id number]  LOCATION:                                 FACILITY:  PHYSICIAN:  Kathreen CosierBernard A. Marshall, M.D.DATE OF BIRTH:  13-Jul-1986  DATE OF ADMISSION: DATE OF DISCHARGE:                             HISTORY & PHYSICAL   Date of operation, November 04, 2014.  The patient is a 29 year old, gravida 5, para 3-2-0-5, who desires sterilization.  She understands procedure can fail resulting in a pregnancy in the uterus or tube.  PAST MEDICAL HISTORY:  Negative.  PAST SURGICAL HISTORY:  Negative.  SOCIAL HISTORY:  Negative.  SYSTEM REVIEW:  Negative.  PHYSICAL EXAMINATION:  GENERAL:  Well-developed female, in no distress. HEENT:  Negative. LUNGS:  Clear to P and A. HEART:  Regular rhythm.  No murmurs, no gallops. LUNGS:  Clear to P and A. ABDOMEN:  Negative.  Uterus, normal.  Negative adnexa. EXTREMITIES:  Negative.          ______________________________ Kathreen CosierBernard A. Marshall, M.D.     BAM/MEDQ  D:  10/29/2014  T:  10/29/2014  Job:  [id number]

## 2014-11-02 ENCOUNTER — Inpatient Hospital Stay (HOSPITAL_COMMUNITY)
Admission: RE | Admit: 2014-11-02 | Discharge: 2014-11-02 | Disposition: A | Discharge status: Home / Self Care | Payer: Medicaid Other | Source: Ambulatory Visit

## 2014-11-02 NOTE — Patient Instructions (Signed)
Your procedure is scheduled on:  Wednesday, November 04, 2014  [facility]:  7:00 AM  Pick up the phone at the desk and dial [phone].  Call this number if you have problems the morning of surgery: [phone].  Remember: Do NOT eat food:  AFTER MIDNIGHT TUESDAY Do NOT drink clear liquids after: AFTER MIDNIGHT TUESDAY Take these medicines the morning of surgery with a SIP OF WATER: NONE  Do NOT wear jewelry (body piercing), metal hair clips/bobby pins, make-up, or nail polish. Do NOT wear lotions, powders, or perfumes.  You may wear deoderant. Do NOT shave for 48 hours prior to surgery. Do NOT bring valuables to the hospital. Contacts, dentures, or bridgework may not be worn into surgery.  Have a responsible adult drive you home and stay with you for 24 hours after your procedure

## 2014-11-04 ENCOUNTER — Ambulatory Visit (HOSPITAL_COMMUNITY): Admission: RE | Admit: 2014-11-04 | Payer: Medicaid Other | Source: Ambulatory Visit | Admitting: Obstetrics

## 2014-11-04 ENCOUNTER — Encounter (HOSPITAL_COMMUNITY): Admission: RE | Payer: Self-pay | Source: Ambulatory Visit

## 2014-11-04 SURGERY — LIGATION, FALLOPIAN TUBE, LAPAROSCOPIC
Anesthesia: General | Laterality: Bilateral

## 2014-11-23 ENCOUNTER — Emergency Department (INDEPENDENT_AMBULATORY_CARE_PROVIDER_SITE_OTHER)
Admission: EM | Admit: 2014-11-23 | Discharge: 2014-11-23 | Disposition: A | Discharge status: Home / Self Care | Payer: Medicaid Other | Source: Home / Self Care | Attending: Family Medicine | Admitting: Family Medicine

## 2014-11-23 ENCOUNTER — Encounter (HOSPITAL_COMMUNITY): Payer: Self-pay | Admitting: Emergency Medicine

## 2014-11-23 DIAGNOSIS — R110 Nausea: Secondary | ICD-10-CM

## 2014-11-23 LAB — POCT PREGNANCY, URINE: PREG TEST UR: NEGATIVE

## 2014-11-23 MED ORDER — ONDANSETRON 4 MG PO TBDP: 4.0000 mg | ORAL_TABLET | Freq: Four times a day (QID) | ORAL | Status: DC | PRN

## 2014-11-23 NOTE — Discharge Instructions (Signed)

## 2014-11-23 NOTE — ED Notes (Signed)
Requesting pregnancy test.  Reports delivered baby in November 2015.  Once bleeding stopped in December, has not had a period since post delivery bleeding stopped

## 2014-11-23 NOTE — ED Provider Notes (Signed)
CSN: [id number]     Arrival date & time 11/23/14  1034 History   First MD Initiated Contact with Patient 11/23/14 1146     Chief Complaint  Patient presents with  . Possible Pregnancy   (Consider location/radiation/quality/duration/timing/severity/associated sxs/prior Treatment) HPI       29 year old female presents requesting a pregnancy test. She delivered a baby in November, 3 months ago. Her period has not resumed so she is worried that she may be pregnant. She also describes very occasional nausea without vomiting, and she had a couple of mild headaches last week. She got dizzy one time last week also. Additionally she reports that her mouth waters when she smokes cigarettes, she thinks this may be a sign of pregnancy.No abdominal pain, vaginal discharge, vaginal bleeding. No other systemic symptoms  Past Medical History  Diagnosis Date  . Anemia   . Headache(784.0)   . Urinary tract infection   . Pregnancy induced hypertension   . Hx of chlamydia infection   . Depression     ppd with first child   Past Surgical History  Procedure Laterality Date  . No past surgeries     Family History  Problem Relation Age of Onset  . Anesthesia problems Neg Hx   . Hypotension Neg Hx   . Malignant hyperthermia Neg Hx   . Pseudochol deficiency Neg Hx   . Other Neg Hx   . Cancer Neg Hx   . Heart disease Neg Hx   . Stroke Neg Hx   . Hearing loss Neg Hx   . Asthma Son   . Asthma Paternal Aunt    History  Substance Use Topics  . Smoking status: Never Smoker   . Smokeless tobacco: Never Used  . Alcohol Use: No   OB History    Gravida Para Term Preterm AB TAB SAB Ectopic Multiple Living   0 1 0 0 5     Review of Systems  Gastrointestinal: Positive for nausea. Negative for vomiting, abdominal pain and diarrhea.  Genitourinary: Negative for vaginal bleeding and pelvic pain.  Neurological: Positive for headaches.  All other systems reviewed and are negative.   Allergies   Review of patient's allergies indicates no known allergies.  Home Medications   Prior to Admission medications   Medication Sig Start Date End Date Taking? Authorizing Provider  ondansetron (ZOFRAN-ODT) 4 MG disintegrating tablet Take 1 tablet (4 mg total) by mouth every 6 (six) hours as needed for nausea. PRN for nausea or vomiting 11/23/14   Graylon Good, PA-C  Prenatal Vit-Fe Fumarate-FA (MULTIVITAMIN-PRENATAL) 27-0.8 MG TABS tablet Take 1 tablet by mouth daily at 12 noon.    Historical Provider, MD   BP 123/80 mmHg  Pulse 61  Temp(Src) 98.1 F (36.7 C) (Oral)  Resp 16  SpO2 99% Physical Exam  Constitutional: She is oriented to person, place, and time. Vital signs are normal. She appears well-developed and well-nourished. No distress.  HENT:  Head: Normocephalic and atraumatic.  Cardiovascular: Normal rate, regular rhythm and normal heart sounds.   Pulmonary/Chest: Effort normal. No respiratory distress.  Abdominal: Soft. She exhibits no distension and no mass. There is no tenderness. There is no rebound and no guarding.  Neurological: She is alert and oriented to person, place, and time. She has normal strength. Coordination normal.  Skin: Skin is warm and dry. No rash noted. She is not diaphoretic.  Psychiatric: She has a normal mood and affect. Judgment normal.  Nursing  note and vitals reviewed.   ED Course  Procedures (including critical care time) Labs Review Labs Reviewed - No data to display  Imaging Review No results found.   MDM   1. Nausea    Urine pregnancy test negative. Follow-up when necessary. Recommended follow-up with OB/GYN if her period does not resume in the next couple of months  Meds ordered this encounter  Medications  . ondansetron (ZOFRAN-ODT) 4 MG disintegrating tablet    Sig: Take 1 tablet (4 mg total) by mouth every 6 (six) hours as needed for nausea. PRN for nausea or vomiting    Dispense:  12 tablet    Refill:  0       Graylon GoodZachary  H Dashanique Brownstein, PA-C 11/23/14 1227

## 2014-12-02 ENCOUNTER — Telehealth (HOSPITAL_COMMUNITY): Payer: Self-pay | Admitting: *Deleted

## 2014-12-02 NOTE — ED Notes (Addendum)
Pt. called on VM and said someone called her and left her a message from here. I do not see a note.  I called pt. back and left a message to call.  Call 1. Margaret Cain, [name] M 12/02/2014 Pt. called back.  I told her I did not see a note that anyone called her.  She said someone called her and told her to f/u with her OB-GYN.  She wants to know what is wrong. I told her dx. is nausea. She said she had unprotected sex after she was checked here. She asked what she should do.  I told if she misses her next period to do a pregnancy test.  If pos., she should f/u with her OB-GYN.  Pt. voiced understanding.

## 2014-12-10 ENCOUNTER — Encounter (HOSPITAL_COMMUNITY): Payer: Self-pay | Admitting: *Deleted

## 2014-12-10 ENCOUNTER — Inpatient Hospital Stay (HOSPITAL_COMMUNITY)
Admission: AD | Admit: 2014-12-10 | Discharge: 2014-12-10 | Disposition: A | Discharge status: Home / Self Care | Payer: Medicaid Other | Source: Ambulatory Visit | Attending: Obstetrics | Admitting: Obstetrics

## 2014-12-10 DIAGNOSIS — N949 Unspecified condition associated with female genital organs and menstrual cycle: Secondary | ICD-10-CM | POA: Insufficient documentation

## 2014-12-10 DIAGNOSIS — F17200 Nicotine dependence, unspecified, uncomplicated: Secondary | ICD-10-CM | POA: Diagnosis not present

## 2014-12-10 DIAGNOSIS — N939 Abnormal uterine and vaginal bleeding, unspecified: Secondary | ICD-10-CM | POA: Diagnosis present

## 2014-12-10 DIAGNOSIS — R102 Pelvic and perineal pain: Secondary | ICD-10-CM

## 2014-12-10 LAB — HCG, QUANTITATIVE, PREGNANCY: hCG, Beta Chain, Quant, S: <1 m[IU]/mL (ref <5)

## 2014-12-10 LAB — URINALYSIS, ROUTINE W REFLEX MICROSCOPIC
Glucose, UA: 100 mg/dL — AB
Ketones, ur: 15 mg/dL — AB
NITRITE: NEGATIVE
Protein, ur: 100 mg/dL — AB
Specific Gravity, Urine: 1.025 (ref 1.005–1.030)
UROBILINOGEN UA: 2 mg/dL — AB (ref 0.0–1.0)
pH: 5.5 (ref 5.0–8.0)

## 2014-12-10 LAB — WET PREP, GENITAL
Trich, Wet Prep: NONE SEEN
YEAST WET PREP: NONE SEEN

## 2014-12-10 LAB — URINE MICROSCOPIC-ADD ON

## 2014-12-10 MED ORDER — IBUPROFEN 800 MG PO TABS
800.0000 mg | ORAL_TABLET | Freq: Once | ORAL | Status: AC
  Administered 2014-12-10: 800 mg via ORAL
  Filled 2014-12-10: qty 1

## 2014-12-10 NOTE — MAU Note (Signed)
Thinks that she is pregnant even though she is having moderate vaginal bleeding that started this AM; had a period that ended 11-29-14; requesting serum pregnancy test and u/s; UPT is negative;

## 2014-12-10 NOTE — MAU Provider Note (Signed)
History     CSN: [id number]  Arrival date and time: 12/10/14 [id number]   First Provider Initiated Contact with Patient 12/10/14 [phone]      Chief Complaint  Patient presents with  . Vaginal Bleeding   HPI Comments: Margaret Cain 29 y.o. [id number] presents to MAU because she is having blood clots with what sounds like her menstrual cycle. She has taken multiple pregnancy tests and all have been negative. She has a 193 month old at home and is trying to get pregnant again because the new FOB does not have any children. She is requesting a blood test for pregnancy.  Vaginal Bleeding      Past Medical History  Diagnosis Date  . Anemia   . Headache(784.0)   . Urinary tract infection   . Pregnancy induced hypertension   . Hx of chlamydia infection   . Depression     ppd with first child    Past Surgical History  Procedure Laterality Date  . No past surgeries      Family History  Problem Relation Age of Onset  . Anesthesia problems Neg Hx   . Hypotension Neg Hx   . Malignant hyperthermia Neg Hx   . Pseudochol deficiency Neg Hx   . Other Neg Hx   . Cancer Neg Hx   . Heart disease Neg Hx   . Stroke Neg Hx   . Hearing loss Neg Hx   . Asthma Son   . Asthma Paternal Aunt     History  Substance Use Topics  . Smoking status: Current Every Day Smoker -- 0.25 packs/day  . Smokeless tobacco: Never Used  . Alcohol Use: No    Allergies: No Known Allergies  Prescriptions prior to admission  Medication Sig Dispense Refill Last Dose  . ibuprofen (ADVIL,MOTRIN) 200 MG tablet Take 200 mg by mouth every 6 (six) hours as needed for headache.   11/26/2014  . Prenatal Vit-Fe Fumarate-FA (MULTIVITAMIN-PRENATAL) 27-0.8 MG TABS tablet Take 1 tablet by mouth daily at 12 noon.   Past Week at Unknown time  . ondansetron (ZOFRAN-ODT) 4 MG disintegrating tablet Take 1 tablet (4 mg total) by mouth every 6 (six) hours as needed for nausea. PRN for nausea or vomiting (Patient not taking: Reported on  12/10/2014) 12 tablet 0     Review of Systems  Constitutional: Negative.   HENT: Negative.   Eyes: Negative.   Respiratory: Negative.   Cardiovascular: Negative.   Gastrointestinal: Negative.   Genitourinary: Positive for vaginal bleeding.       Vaginal bleeding   Musculoskeletal: Negative.   Skin: Negative.   Neurological: Negative.   Psychiatric/Behavioral: Negative.    Physical Exam   Blood pressure 110/72, pulse 98, temperature 97.8 F (36.6 C), temperature source Oral, resp. rate 16, height 5' (1.524 m), weight 44.906 kg (99 lb), unknown if currently breastfeeding.  Physical Exam  Constitutional: She is oriented to person, place, and time. She appears well-developed and well-nourished. No distress.  Questionable mental capacities   HENT:  Head: Normocephalic and atraumatic.  GI: Soft. She exhibits no distension. There is tenderness. There is no rebound and no guarding.  Over uterus  Genitourinary:  Genital:external negative Vaginal:moderate amount blood/ no clots Cervix:closed thick Bimanual:tenderness over uterus   Neurological: She is alert and oriented to person, place, and time.  Skin: Skin is warm and dry.  Psychiatric: She has a normal mood and affect. Her behavior is normal. Judgment and thought content normal.   Results  for orders placed or performed during the hospital encounter of 12/10/14 (from the past 24 hour(s))  Urinalysis, Routine w reflex microscopic     Status: Abnormal   Collection Time: 12/10/14  8:40 AM  Result Value Ref Range   Color, Urine RED (A) YELLOW   APPearance CLOUDY (A) CLEAR   Specific Gravity, Urine 1.025 1.005 - 1.030   pH 5.5 5.0 - 8.0   Glucose, UA 100 (A) NEGATIVE mg/dL   Hgb urine dipstick LARGE (A) NEGATIVE   Bilirubin Urine SMALL (A) NEGATIVE   Ketones, ur 15 (A) NEGATIVE mg/dL   Protein, ur 161 (A) NEGATIVE mg/dL   Urobilinogen, UA 2.0 (H) 0.0 - 1.0 mg/dL   Nitrite NEGATIVE NEGATIVE   Leukocytes, UA TRACE (A) NEGATIVE   Urine microscopic-add on     Status: Abnormal   Collection Time: 12/10/14  8:40 AM  Result Value Ref Range   Squamous Epithelial / LPF FEW (A) RARE   WBC, UA 0-2 <3 WBC/hpf   RBC / HPF TOO NUMEROUS TO COUNT <3 RBC/hpf   Bacteria, UA RARE RARE  hCG, quantitative, pregnancy     Status: None   Collection Time: 12/10/14  9:20 AM  Result Value Ref Range   hCG, Beta Chain, Quant, S <1 <5 mIU/mL  Wet prep, genital     Status: Abnormal   Collection Time: 12/10/14  9:35 AM  Result Value Ref Range   Yeast Wet Prep HPF POC NONE SEEN NONE SEEN   Trich, Wet Prep NONE SEEN NONE SEEN   Clue Cells Wet Prep HPF POC FEW (A) NONE SEEN   WBC, Wet Prep HPF POC FEW (A) NONE SEEN    MAU Course  Procedures  MDM BHCG Motrin 800 mg once  Assessment and Plan   A: Pelvic pain/ likely related to menses  P: Advised to use Motrin as needed Follow up with Dr Gaynell Face as needed Advised to use Emergency Room for emergencies only  Carolynn Serve 12/10/2014, 9:40 AM

## 2014-12-10 NOTE — MAU Note (Signed)
Delivered vaginally 09-08-14; had 1st bleeding after delivery on 11-24-14 and ended 11-29-14; having more bleeding with large clots (started this AM); having pregnancy symptoms like sore breasts, headaches and having nausea;

## 2014-12-10 NOTE — Discharge Instructions (Signed)
Abdominal Pain, Women °Abdominal (stomach, pelvic, or belly) pain can be caused by many things. It is important to tell your doctor: °· The location of the pain. °· Does it come and go or is it present all the time? °· Are there things that start the pain (eating certain foods, exercise)? °· Are there other symptoms associated with the pain (fever, nausea, vomiting, diarrhea)? °All of this is helpful to know when trying to find the cause of the pain. °CAUSES  °· Stomach: virus or bacteria infection, or ulcer. °· Intestine: appendicitis (inflamed appendix), regional ileitis (Crohn's disease), ulcerative colitis (inflamed colon), irritable bowel syndrome, diverticulitis (inflamed diverticulum of the colon), or cancer of the stomach or intestine. °· Gallbladder disease or stones in the gallbladder. °· Kidney disease, kidney stones, or infection. °· Pancreas infection or cancer. °· Fibromyalgia (pain disorder). °· Diseases of the female organs: °¨ Uterus: fibroid (non-cancerous) tumors or infection. °¨ Fallopian tubes: infection or tubal pregnancy. °¨ Ovary: cysts or tumors. °¨ Pelvic adhesions (scar tissue). °¨ Endometriosis (uterus lining tissue growing in the pelvis and on the pelvic organs). °¨ Pelvic congestion syndrome (female organs filling up with blood just before the menstrual period). °¨ Pain with the menstrual period. °¨ Pain with ovulation (producing an egg). °¨ Pain with an IUD (intrauterine device, birth control) in the uterus. °¨ Cancer of the female organs. °· Functional pain (pain not caused by a disease, may improve without treatment). °· Psychological pain. °· Depression. °DIAGNOSIS  °Your doctor will decide the seriousness of your pain by doing an examination. °· Blood tests. °· X-rays. °· Ultrasound. °· CT scan (computed tomography, special type of X-ray). °· MRI (magnetic resonance imaging). °· Cultures, for infection. °· Barium enema (dye inserted in the large intestine, to better view it with  X-rays). °· Colonoscopy (looking in intestine with a lighted tube). °· Laparoscopy (minor surgery, looking in abdomen with a lighted tube). °· Major abdominal exploratory surgery (looking in abdomen with a large incision). °TREATMENT  °The treatment will depend on the cause of the pain.  °· Many cases can be observed and treated at home. °· Over-the-counter medicines recommended by your caregiver. °· Prescription medicine. °· Antibiotics, for infection. °· Birth control pills, for painful periods or for ovulation pain. °· Hormone treatment, for endometriosis. °· Nerve blocking injections. °· Physical therapy. °· Antidepressants. °· Counseling with a psychologist or psychiatrist. °· Minor or major surgery. °HOME CARE INSTRUCTIONS  °· Do not take laxatives, unless directed by your caregiver. °· Take over-the-counter pain medicine only if ordered by your caregiver. Do not take aspirin because it can cause an upset stomach or bleeding. °· Try a clear liquid diet (broth or water) as ordered by your caregiver. Slowly move to a bland diet, as tolerated, if the pain is related to the stomach or intestine. °· Have a thermometer and take your temperature several times a day, and record it. °· Bed rest and sleep, if it helps the pain. °· Avoid sexual intercourse, if it causes pain. °· Avoid stressful situations. °· Keep your follow-up appointments and tests, as your caregiver orders. °· If the pain does not go away with medicine or surgery, you may try: °¨ Acupuncture. °¨ Relaxation exercises (yoga, meditation). °¨ Group therapy. °¨ Counseling. °SEEK MEDICAL CARE IF:  °· You notice certain foods cause stomach pain. °· Your home care treatment is not helping your pain. °· You need stronger pain medicine. °· You want your IUD removed. °· You feel faint or   lightheaded. °· You develop nausea and vomiting. °· You develop a rash. °· You are having side effects or an allergy to your medicine. °SEEK IMMEDIATE MEDICAL CARE IF:  °· Your  pain does not go away or gets worse. °· You have a fever. °· Your pain is felt only in portions of the abdomen. The right side could possibly be appendicitis. The left lower portion of the abdomen could be colitis or diverticulitis. °· You are passing blood in your stools (bright red or black tarry stools, with or without vomiting). °· You have blood in your urine. °· You develop chills, with or without a fever. °· You pass out. °MAKE SURE YOU:  °· Understand these instructions. °· Will watch your condition. °· Will get help right away if you are not doing well or get worse. °Document Released: 07/30/2007 Document Revised: 02/16/2014 Document Reviewed: 08/19/2009 °[employer]® Patient Information ©2015 [employer]. This information is not intended to replace advice given to you by your health care provider. Make sure you discuss any questions you have with your health care provider. ° °

## 2014-12-11 LAB — GC/CHLAMYDIA PROBE AMP (~~LOC~~) NOT AT ARMC
Chlamydia: NEGATIVE
Neisseria Gonorrhea: NEGATIVE

## 2014-12-11 LAB — HIV ANTIBODY (ROUTINE TESTING W REFLEX): HIV Screen 4th Generation wRfx: NONREACTIVE

## 2015-03-05 ENCOUNTER — Emergency Department (HOSPITAL_COMMUNITY)
Admission: EM | Admit: 2015-03-05 | Discharge: 2015-03-05 | Disposition: A | Discharge status: Home / Self Care | Payer: Medicaid Other | Attending: Emergency Medicine | Admitting: Emergency Medicine

## 2015-03-05 ENCOUNTER — Encounter (HOSPITAL_COMMUNITY): Payer: Self-pay | Admitting: Emergency Medicine

## 2015-03-05 DIAGNOSIS — B349 Viral infection, unspecified: Secondary | ICD-10-CM | POA: Insufficient documentation

## 2015-03-05 DIAGNOSIS — H6123 Impacted cerumen, bilateral: Secondary | ICD-10-CM | POA: Diagnosis not present

## 2015-03-05 DIAGNOSIS — Z862 Personal history of diseases of the blood and blood-forming organs and certain disorders involving the immune mechanism: Secondary | ICD-10-CM | POA: Diagnosis not present

## 2015-03-05 DIAGNOSIS — Z720 Tobacco use: Secondary | ICD-10-CM | POA: Diagnosis not present

## 2015-03-05 DIAGNOSIS — Z87440 Personal history of urinary (tract) infections: Secondary | ICD-10-CM | POA: Insufficient documentation

## 2015-03-05 DIAGNOSIS — J029 Acute pharyngitis, unspecified: Secondary | ICD-10-CM | POA: Diagnosis present

## 2015-03-05 DIAGNOSIS — Z8659 Personal history of other mental and behavioral disorders: Secondary | ICD-10-CM | POA: Insufficient documentation

## 2015-03-05 MED ORDER — GUAIFENESIN 100 MG/5ML PO LIQD: 100.0000 mg | ORAL | Status: DC | PRN

## 2015-03-05 MED ORDER — IBUPROFEN 200 MG PO TABS: 200.0000 mg | ORAL_TABLET | Freq: Four times a day (QID) | ORAL | Status: DC | PRN

## 2015-03-05 NOTE — ED Provider Notes (Signed)
CSN: [id number]     Arrival date & time 03/05/15  2154 History  This chart was scribed for non-physician practitioner, Fayrene HelperBowie Macari Zalesky, PA-C,working with Purvis SheffieldForrest Harrison, MD, by Karle PlumberJennifer Tensley, ED Scribe. This patient was seen in room TR05C/TR05C and the patient's care was started at 10:06 PM.  Chief Complaint  Patient presents with  . Sore Throat   HPI  HPI Comments:  Margaret Cain is a 29 y.o. female who presents to the Emergency Department complaining of sore throat that began about one week ago. She reports associated subjective fever, nonproductive cough, chills, HA, generalized body aches and congestion. She denies any sick contacts. Pt reports taking Tylenol with no significant relief of her symptoms. No modifying factors are reported. Denies rhinorrhea, sneezing, abdominal pain, nausea, vomiting or rash. Denies smoking.   Past Medical History  Diagnosis Date  . Anemia   . Headache(784.0)   . Urinary tract infection   . Pregnancy induced hypertension   . Hx of chlamydia infection   . Depression     ppd with first child   Past Surgical History  Procedure Laterality Date  . No past surgeries     Family History  Problem Relation Age of Onset  . Anesthesia problems Neg Hx   . Hypotension Neg Hx   . Malignant hyperthermia Neg Hx   . Pseudochol deficiency Neg Hx   . Other Neg Hx   . Cancer Neg Hx   . Heart disease Neg Hx   . Stroke Neg Hx   . Hearing loss Neg Hx   . Asthma Son   . Asthma Paternal Aunt    History  Substance Use Topics  . Smoking status: Current Every Day Smoker -- 0.25 packs/day  . Smokeless tobacco: Never Used  . Alcohol Use: No   OB History    Gravida Para Term Preterm AB TAB SAB Ectopic Multiple Living   6 5 4 1 1  0 1 0 0 5     Review of Systems  Constitutional: Positive for fever (subjective).  HENT: Positive for congestion and sore throat. Negative for rhinorrhea and sneezing.   Respiratory: Positive for cough.   Gastrointestinal: Negative  for nausea, vomiting and abdominal pain.  Musculoskeletal: Positive for myalgias.  Skin: Negative for rash.    Allergies  Review of patient's allergies indicates no known allergies.  Home Medications   Prior to Admission medications   Medication Sig Start Date End Date Taking? Authorizing Provider  ibuprofen (ADVIL,MOTRIN) 200 MG tablet Take 200 mg by mouth every 6 (six) hours as needed for headache.    Historical Provider, MD  ondansetron (ZOFRAN-ODT) 4 MG disintegrating tablet Take 1 tablet (4 mg total) by mouth every 6 (six) hours as needed for nausea. PRN for nausea or vomiting Patient not taking: Reported on 12/10/2014 11/23/14   Graylon GoodZachary H Baker, PA-C  Prenatal Vit-Fe Fumarate-FA (MULTIVITAMIN-PRENATAL) 27-0.8 MG TABS tablet Take 1 tablet by mouth daily at 12 noon.    Historical Provider, MD   BP 117/69 mmHg  Pulse 87  Temp(Src) 98.3 F (36.8 C) (Oral)  Resp 20  SpO2 100% Physical Exam  Constitutional: She is oriented to person, place, and time. She appears well-developed and well-nourished.  HENT:  Head: Normocephalic and atraumatic.  Nose: Nose normal.  Mouth/Throat: Uvula is midline, oropharynx is clear and moist and mucous membranes are normal. No trismus in the jaw. No oropharyngeal exudate or tonsillar abscesses.  Cerumen impaction bilaterally.  Eyes: EOM are normal.  Neck:  Normal range of motion.  Cardiovascular: Normal rate, regular rhythm and normal heart sounds.  Exam reveals no gallop and no friction rub.   No murmur heard. Pulmonary/Chest: Effort normal and breath sounds normal. No respiratory distress. She has no wheezes. She has no rales.  Musculoskeletal: Normal range of motion.  Lymphadenopathy:    She has no cervical adenopathy.  Neurological: She is alert and oriented to person, place, and time.  Skin: Skin is warm and dry.  Psychiatric: She has a normal mood and affect. Her behavior is normal.  Nursing note and vitals reviewed.   ED Course  Procedures  (including critical care time) DIAGNOSTIC STUDIES: Oxygen Saturation is 100% on RA, normal by my interpretation.   COORDINATION OF CARE: 10:13 PM- Informed pt her symptoms were likely viral and to increase fluids and take OTC Motrin or Tylenol for fever and body aches. Return precautions discussed. Pt verbalizes understanding and agrees to plan.  Medications - No data to display  Labs Review Labs Reviewed - No data to display  Imaging Review No results found.   EKG Interpretation None      MDM   Final diagnoses:  Viral infection   BP 117/69 mmHg  Pulse 87  Temp(Src) 98.3 F (36.8 C) (Oral)  Resp 20  SpO2 100%   I personally performed the services described in this documentation, which was scribed in my presence. The recorded information has been reviewed and is accurate.    Fayrene HelperBowie Pasquale Matters, PA-C 03/05/15 2215  Purvis SheffieldForrest Harrison, MD 03/05/15 [phone]

## 2015-03-05 NOTE — ED Notes (Signed)
PA assessment and discharge occurred prior to RN assessment  

## 2015-03-05 NOTE — Discharge Instructions (Signed)

## 2015-03-05 NOTE — ED Notes (Signed)
Pt. reports sore throat , fatigue , mild headache with occasional dry cough onset yesterday , denies fever or chills.

## 2015-03-30 ENCOUNTER — Emergency Department (HOSPITAL_COMMUNITY)
Admission: EM | Admit: 2015-03-30 | Discharge: 2015-03-30 | Disposition: A | Discharge status: Home / Self Care | Payer: Medicaid Other | Attending: Emergency Medicine | Admitting: Emergency Medicine

## 2015-03-30 ENCOUNTER — Encounter (HOSPITAL_COMMUNITY): Payer: Self-pay | Admitting: *Deleted

## 2015-03-30 DIAGNOSIS — R51 Headache: Secondary | ICD-10-CM | POA: Diagnosis present

## 2015-03-30 DIAGNOSIS — Z87440 Personal history of urinary (tract) infections: Secondary | ICD-10-CM | POA: Diagnosis not present

## 2015-03-30 DIAGNOSIS — Z3202 Encounter for pregnancy test, result negative: Secondary | ICD-10-CM | POA: Diagnosis not present

## 2015-03-30 DIAGNOSIS — Z8619 Personal history of other infectious and parasitic diseases: Secondary | ICD-10-CM | POA: Insufficient documentation

## 2015-03-30 DIAGNOSIS — Z8659 Personal history of other mental and behavioral disorders: Secondary | ICD-10-CM | POA: Insufficient documentation

## 2015-03-30 DIAGNOSIS — Z720 Tobacco use: Secondary | ICD-10-CM | POA: Diagnosis not present

## 2015-03-30 DIAGNOSIS — Z862 Personal history of diseases of the blood and blood-forming organs and certain disorders involving the immune mechanism: Secondary | ICD-10-CM | POA: Insufficient documentation

## 2015-03-30 DIAGNOSIS — B349 Viral infection, unspecified: Secondary | ICD-10-CM | POA: Diagnosis not present

## 2015-03-30 LAB — I-STAT CHEM 8, ED
BUN: 9 mg/dL (ref 6–20)
Calcium, Ion: 1.09 mmol/L — ABNORMAL LOW (ref 1.12–1.23)
Chloride: 105 mmol/L (ref 101–111)
Creatinine, Ser: 0.8 mg/dL (ref 0.44–1.00)
Glucose, Bld: 92 mg/dL (ref 65–99)
HCT: 41 % (ref 36.0–46.0)
Hemoglobin: 13.9 g/dL (ref 12.0–15.0)
Potassium: 3.6 mmol/L (ref 3.5–5.1)
Sodium: 143 mmol/L (ref 135–145)
TCO2: 25 mmol/L (ref 0–100)

## 2015-03-30 LAB — POC URINE PREG, ED: Preg Test, Ur: NEGATIVE

## 2015-03-30 LAB — RAPID STREP SCREEN (MED CTR MEBANE ONLY): Streptococcus, Group A Screen (Direct): NEGATIVE

## 2015-03-30 MED ORDER — METOCLOPRAMIDE HCL 5 MG/ML IJ SOLN
10.0000 mg | Freq: Once | INTRAMUSCULAR | Status: AC
  Administered 2015-03-30: 10 mg via INTRAVENOUS
  Filled 2015-03-30: qty 2

## 2015-03-30 MED ORDER — KETOROLAC TROMETHAMINE 30 MG/ML IJ SOLN
30.0000 mg | Freq: Once | INTRAMUSCULAR | Status: AC
  Administered 2015-03-30: 30 mg via INTRAVENOUS
  Filled 2015-03-30: qty 1

## 2015-03-30 MED ORDER — SODIUM CHLORIDE 0.9 % IV BOLUS (SEPSIS)
1000.0000 mL | Freq: Once | INTRAVENOUS | Status: AC
Start: 2015-03-30 — End: 2015-03-30
  Administered 2015-03-30: 1000 mL via INTRAVENOUS

## 2015-03-30 MED ORDER — DIPHENHYDRAMINE HCL 50 MG/ML IJ SOLN
25.0000 mg | Freq: Once | INTRAMUSCULAR | Status: AC
  Administered 2015-03-30: 25 mg via INTRAVENOUS
  Filled 2015-03-30: qty 1

## 2015-03-30 MED ORDER — DEXAMETHASONE SODIUM PHOSPHATE 10 MG/ML IJ SOLN
10.0000 mg | Freq: Once | INTRAMUSCULAR | Status: AC
  Administered 2015-03-30: 10 mg via INTRAVENOUS
  Filled 2015-03-30: qty 1

## 2015-03-30 NOTE — ED Provider Notes (Signed)
CSN: [id number]     Arrival date & time 03/30/15  1723 History   First MD Initiated Contact with Patient 03/30/15 1955     Chief Complaint  Patient presents with  . Headache     (Consider location/radiation/quality/duration/timing/severity/associated sxs/prior Treatment) HPI Pt is a 29yo female with hx of anemia and headaches, presenting to ED with multiple complaints including gradual onset generalized headache, sore throat, body aches, fatigue, generalized weakness, Left ear pain, subjective fever and nausea.  Generalized pain and headache is 7/10 in severity, aching and sore. Pt states she woke up this morning with the symptoms. Reports nasal congestion but no cough, CP or SOB. Denies vomiting or diarrhea.  Denies trying any medications at home including tylenol or motrin.  Denies sick contacts or recent travel. Pt does have a PCP but she did not call them today to let them know she was feeling bad.  Pt does state she is concerned she may be pregnant due to her menstrual cycle starting 3 days earlier than normal, LMP: 03/22/15. Pt reports having unprotected sex and is not on birth control. Pt denies trying to become pregnant. Denies urinary or vaginal symptoms.  No other significant PMH.   Past Medical History  Diagnosis Date  . Anemia   . Headache(784.0)   . Urinary tract infection   . Pregnancy induced hypertension   . Hx of chlamydia infection   . Depression     ppd with first child   Past Surgical History  Procedure Laterality Date  . No past surgeries     Family History  Problem Relation Age of Onset  . Anesthesia problems Neg Hx   . Hypotension Neg Hx   . Malignant hyperthermia Neg Hx   . Pseudochol deficiency Neg Hx   . Other Neg Hx   . Cancer Neg Hx   . Heart disease Neg Hx   . Stroke Neg Hx   . Hearing loss Neg Hx   . Asthma Son   . Asthma Paternal Aunt    History  Substance Use Topics  . Smoking status: Current Every Day Smoker -- 0.25 packs/day  . Smokeless  tobacco: Never Used  . Alcohol Use: No   OB History    Gravida Para Term Preterm AB TAB SAB Ectopic Multiple Living   0 1 0 0 5     Review of Systems  Constitutional: Positive for fever (subjective), chills and fatigue. Negative for diaphoresis and appetite change.  HENT: Positive for congestion, ear pain (Left) and sore throat. Negative for trouble swallowing and voice change.   Respiratory: Negative for cough and shortness of breath.   Cardiovascular: Negative for chest pain, palpitations and leg swelling.  Gastrointestinal: Positive for nausea. Negative for vomiting, abdominal pain and diarrhea.  Genitourinary: Positive for menstrual problem (irregular). Negative for dysuria, urgency, frequency, hematuria, flank pain, decreased urine volume, vaginal bleeding, vaginal discharge, vaginal pain and pelvic pain.  Musculoskeletal: Negative for myalgias and back pain.  Skin: Negative for rash.  Neurological: Positive for weakness ( generalized) and headaches. Negative for dizziness, seizures, syncope, light-headedness and numbness.  All other systems reviewed and are negative.     Allergies  Review of patient's allergies indicates no known allergies.  Home Medications   Prior to Admission medications   Medication Sig Start Date End Date Taking? Authorizing Provider  guaiFENesin (ROBITUSSIN) 100 MG/5ML liquid Take 5-10 mLs (100-200 mg total) by mouth every 4 (four) hours as needed for  congestion. Patient not taking: Reported on 03/30/2015 03/05/15   Fayrene Helper, PA-C  ibuprofen (ADVIL,MOTRIN) 200 MG tablet Take 1 tablet (200 mg total) by mouth every 6 (six) hours as needed for headache. Patient not taking: Reported on 03/30/2015 03/05/15   Fayrene Helper, PA-C  ondansetron (ZOFRAN-ODT) 4 MG disintegrating tablet Take 1 tablet (4 mg total) by mouth every 6 (six) hours as needed for nausea. PRN for nausea or vomiting Patient not taking: Reported on 12/10/2014 11/23/14   Graylon Good,  PA-C   BP 131/81 mmHg  Pulse 71  Temp(Src) 98.7 F (37.1 C) (Oral)  Resp 12  SpO2 100%  LMP 03/22/2015 Physical Exam  Constitutional: She appears well-developed and well-nourished. No distress.  HENT:  Head: Normocephalic and atraumatic.  Right Ear: Hearing, tympanic membrane, external ear and ear canal normal.  Left Ear: Hearing, tympanic membrane, external ear and ear canal normal.  Nose: Nose normal.  Mouth/Throat: Uvula is midline and mucous membranes are normal. Posterior oropharyngeal erythema present. No oropharyngeal exudate, posterior oropharyngeal edema or tonsillar abscesses.  Eyes: Conjunctivae are normal. No scleral icterus.  Neck: Normal range of motion. Neck supple.  Cardiovascular: Normal rate, regular rhythm and normal heart sounds.   Pulmonary/Chest: Effort normal and breath sounds normal. No respiratory distress. She has no wheezes. She has no rales. She exhibits no tenderness.  Abdominal: Soft. Bowel sounds are normal. She exhibits no distension and no mass. There is no tenderness. There is no rebound and no guarding.  Musculoskeletal: Normal range of motion.  Neurological: She is alert.  Skin: Skin is warm and dry. She is not diaphoretic.  Nursing note and vitals reviewed.   ED Course  Procedures (including critical care time) Labs Review Labs Reviewed  I-STAT CHEM 8, ED - Abnormal; Notable for the following:    Calcium, Ion 1.09 (*)    All other components within normal limits  RAPID STREP SCREEN (NOT AT [facility])  CULTURE, GROUP A STREP  POC URINE PREG, ED    Imaging Review No results found.   EKG Interpretation None      MDM   Final diagnoses:  Viral illness    Pt is a 28yo female with hx of anemia and headaches, presenting to ED with multiple complaints c/w viral illness including subjective fever, body aches, sore throat, ear pain and nausea. Pt is concerned she may be pregnant due to cycle starting early and not her "normal cycle" pt has  had unprotected sex and is not on birth control. Pt states she is not trying to become pregnant.   Urine preg: negative i-stat Chem 8:  WNL, specifically Hgb/Hct 13.9/41.0   Rapid strep: negative Symptoms most c/w viral illness. Encouraged pt to use acetaminophen and ibuprofen as needed for fever and pain. Encouraged rest and fluids. Return precautions provided. Pt verbalized understanding and agreement with tx plan.     Junius Finner, PA-C 03/31/15 0434  Richardean Canal, MD 04/01/15 1630

## 2015-03-30 NOTE — ED Notes (Signed)
Pt in c/o headache, sore throat, left earache, generalized fatigue, and nausea, intermittent fever, symptoms started this morning, no distress noted

## 2015-03-30 NOTE — Discharge Instructions (Signed)
You may take 400-600mg  Ibuprofen (Motrin) every 6-8 hours for fever and pain  Alternate with Tylenol  You may take 500mg  Tylenol every 4-6 hours as needed for fever and pain  Follow-up with your primary care provider next week for recheck of symptoms if not improving.  Be sure to drink plenty of fluids and rest, at least 8hrs of sleep a night, preferably more while you are sick. Return to the ED if you cannot keep down fluids/signs of dehydration, fever not reducing with Tylenol, difficulty breathing/wheezing, stiff neck, worsening condition, or other concerns (see below)

## 2015-04-03 LAB — CULTURE, GROUP A STREP: Strep A Culture: NEGATIVE

## 2015-06-26 ENCOUNTER — Encounter (HOSPITAL_COMMUNITY): Payer: Self-pay | Admitting: *Deleted

## 2015-06-26 ENCOUNTER — Emergency Department (HOSPITAL_COMMUNITY)
Admission: EM | Admit: 2015-06-26 | Discharge: 2015-06-26 | Disposition: A | Discharge status: Home / Self Care | Payer: Medicaid Other | Attending: Emergency Medicine | Admitting: Emergency Medicine

## 2015-06-26 DIAGNOSIS — Z3202 Encounter for pregnancy test, result negative: Secondary | ICD-10-CM | POA: Diagnosis not present

## 2015-06-26 DIAGNOSIS — Z862 Personal history of diseases of the blood and blood-forming organs and certain disorders involving the immune mechanism: Secondary | ICD-10-CM | POA: Insufficient documentation

## 2015-06-26 DIAGNOSIS — M545 Low back pain: Secondary | ICD-10-CM | POA: Insufficient documentation

## 2015-06-26 DIAGNOSIS — K529 Noninfective gastroenteritis and colitis, unspecified: Secondary | ICD-10-CM | POA: Diagnosis not present

## 2015-06-26 DIAGNOSIS — N938 Other specified abnormal uterine and vaginal bleeding: Secondary | ICD-10-CM | POA: Insufficient documentation

## 2015-06-26 DIAGNOSIS — Z8619 Personal history of other infectious and parasitic diseases: Secondary | ICD-10-CM | POA: Diagnosis not present

## 2015-06-26 DIAGNOSIS — Z720 Tobacco use: Secondary | ICD-10-CM | POA: Insufficient documentation

## 2015-06-26 DIAGNOSIS — R51 Headache: Secondary | ICD-10-CM | POA: Insufficient documentation

## 2015-06-26 DIAGNOSIS — R112 Nausea with vomiting, unspecified: Secondary | ICD-10-CM | POA: Diagnosis present

## 2015-06-26 DIAGNOSIS — Z87440 Personal history of urinary (tract) infections: Secondary | ICD-10-CM | POA: Diagnosis not present

## 2015-06-26 DIAGNOSIS — F329 Major depressive disorder, single episode, unspecified: Secondary | ICD-10-CM | POA: Diagnosis not present

## 2015-06-26 DIAGNOSIS — I10 Essential (primary) hypertension: Secondary | ICD-10-CM | POA: Diagnosis not present

## 2015-06-26 LAB — URINE MICROSCOPIC-ADD ON

## 2015-06-26 LAB — CBC
HEMATOCRIT: 40 % (ref 36.0–46.0)
HEMOGLOBIN: 12.8 g/dL (ref 12.0–15.0)
MCH: 27.5 pg (ref 26.0–34.0)
MCHC: 32 g/dL (ref 30.0–36.0)
MCV: 86 fL (ref 78.0–100.0)
Platelets: 217 10*3/uL (ref 150–400)
RBC: 4.65 MIL/uL (ref 3.87–5.11)
RDW: 13.5 % (ref 11.5–15.5)
WBC: 4.9 10*3/uL (ref 4.0–10.5)

## 2015-06-26 LAB — I-STAT CHEM 8, ED
BUN: 8 mg/dL (ref 6–20)
CHLORIDE: 104 mmol/L (ref 101–111)
CREATININE: 0.8 mg/dL (ref 0.44–1.00)
Calcium, Ion: 1.21 mmol/L (ref 1.12–1.23)
Glucose, Bld: 94 mg/dL (ref 65–99)
HCT: 42 % (ref 36.0–46.0)
Hemoglobin: 14.3 g/dL (ref 12.0–15.0)
POTASSIUM: 3.8 mmol/L (ref 3.5–5.1)
SODIUM: 142 mmol/L (ref 135–145)
TCO2: 24 mmol/L (ref 0–100)

## 2015-06-26 LAB — COMPREHENSIVE METABOLIC PANEL
ALBUMIN: 4 g/dL (ref 3.5–5.0)
ALK PHOS: 71 U/L (ref 38–126)
ALT: 12 U/L — ABNORMAL LOW (ref 14–54)
AST: 20 U/L (ref 15–41)
Anion gap: 5 (ref 5–15)
BILIRUBIN TOTAL: 0.9 mg/dL (ref 0.3–1.2)
BUN: 7 mg/dL (ref 6–20)
CALCIUM: 9 mg/dL (ref 8.9–10.3)
CO2: 26 mmol/L (ref 22–32)
Chloride: 107 mmol/L (ref 101–111)
Creatinine, Ser: 0.84 mg/dL (ref 0.44–1.00)
GFR calc Af Amer: >60 mL/min (ref >60)
GFR calc non Af Amer: >60 mL/min (ref >60)
GLUCOSE: 97 mg/dL (ref 65–99)
Potassium: 3.7 mmol/L (ref 3.5–5.1)
SODIUM: 138 mmol/L (ref 135–145)
Total Protein: 7.2 g/dL (ref 6.5–8.1)

## 2015-06-26 LAB — I-STAT BETA HCG BLOOD, ED (MC, WL, AP ONLY): I-stat hCG, quantitative: <5 m[IU]/mL (ref <5)

## 2015-06-26 LAB — URINALYSIS, ROUTINE W REFLEX MICROSCOPIC
GLUCOSE, UA: NEGATIVE mg/dL
Ketones, ur: NEGATIVE mg/dL
Nitrite: NEGATIVE
Protein, ur: NEGATIVE mg/dL
Specific Gravity, Urine: 1.031 — ABNORMAL HIGH (ref 1.005–1.030)
Urobilinogen, UA: 1 mg/dL (ref 0.0–1.0)
pH: 5.5 (ref 5.0–8.0)

## 2015-06-26 LAB — LIPASE, BLOOD: Lipase: 26 U/L (ref 22–51)

## 2015-06-26 MED ORDER — ACETAMINOPHEN 325 MG PO TABS
650.0000 mg | ORAL_TABLET | Freq: Once | ORAL | Status: AC
  Administered 2015-06-26: 650 mg via ORAL
  Filled 2015-06-26: qty 2

## 2015-06-26 MED ORDER — SODIUM CHLORIDE 0.9 % IV BOLUS (SEPSIS)
1000.0000 mL | Freq: Once | INTRAVENOUS | Status: AC
Start: 2015-06-26 — End: 2015-06-26
  Administered 2015-06-26: 1000 mL via INTRAVENOUS

## 2015-06-26 MED ORDER — ONDANSETRON HCL 4 MG PO TABS: 4.0000 mg | ORAL_TABLET | Freq: Four times a day (QID) | ORAL | Status: DC

## 2015-06-26 MED ORDER — ONDANSETRON HCL 4 MG/2ML IJ SOLN
4.0000 mg | Freq: Once | INTRAMUSCULAR | Status: AC
  Administered 2015-06-26: 4 mg via INTRAVENOUS
  Filled 2015-06-26: qty 2

## 2015-06-26 MED ORDER — ACETAMINOPHEN 500 MG PO TABS: 500.0000 mg | ORAL_TABLET | Freq: Four times a day (QID) | ORAL | Status: DC | PRN

## 2015-06-26 NOTE — ED Provider Notes (Signed)
CSN: [id number]     Arrival date & time 06/26/15  1016 History   First MD Initiated Contact with Patient 06/26/15 1242     Chief Complaint  Patient presents with  . Possible Pregnancy  . Emesis   HPI  Margaret Cain is a 29 year old female presenting with NVD and concerns of pregnancy. Pt states that her period should have started on 9/6 but she only had light spotting. This morning she noted heavier vaginal bleeding that she states is similar to her period but 4 days late. She is also complaining of nausea, vomiting and diarrhea x 1 day. She states she ate dinner Thursday evening and began feeling nausea soon after and went to bed. She woke up yesterday and had multiple episodes of loose, watery stool and vomiting. Denies blood in vomit or stool. States she is so nauseous that she hasn't been able to eat anything yesterday or today. Also complaining of lower lumbar back pain that started yesterday. Pain is midline and "sore". Pt also reports history of headaches and states she has one currently. Headache is typical of those she has had in the past. Denies fevers, chills, visual disturbances, photophobia, phonophobia, chest pain, SOB, abdominal pain, dysuria, muscle aches, numbness, tingling or weakness in extremities. Denies IVDU, bowel/bladder incontinence, sick contacts or recent travel.  Past Medical History  Diagnosis Date  . Anemia   . Headache(784.0)   . Urinary tract infection   . Pregnancy induced hypertension   . Hx of chlamydia infection   . Depression     ppd with first child   Past Surgical History  Procedure Laterality Date  . No past surgeries     Family History  Problem Relation Age of Onset  . Anesthesia problems Neg Hx   . Hypotension Neg Hx   . Malignant hyperthermia Neg Hx   . Pseudochol deficiency Neg Hx   . Other Neg Hx   . Cancer Neg Hx   . Heart disease Neg Hx   . Stroke Neg Hx   . Hearing loss Neg Hx   . Asthma Son   . Asthma Paternal Aunt    Social History   Substance Use Topics  . Smoking status: Current Every Day Smoker -- 0.25 packs/day  . Smokeless tobacco: Never Used  . Alcohol Use: No   OB History    Gravida Para Term Preterm AB TAB SAB Ectopic Multiple Living   6 5 4 1 1  0 1 0 0 5     Review of Systems  Constitutional: Negative for fever and chills.  Eyes: Negative for photophobia and visual disturbance.  Respiratory: Negative for shortness of breath.   Cardiovascular: Negative for chest pain.  Gastrointestinal: Positive for nausea, vomiting and diarrhea. Negative for abdominal pain.  Genitourinary: Positive for vaginal bleeding. Negative for dysuria and vaginal discharge.  Musculoskeletal: Positive for back pain. Negative for myalgias.  Neurological: Positive for headaches. Negative for syncope, weakness, light-headedness and numbness.      Allergies  Review of patient's allergies indicates no known allergies.  Home Medications   Prior to Admission medications   Medication Sig Start Date End Date Taking? Authorizing Provider  acetaminophen (TYLENOL) 500 MG tablet Take 1 tablet (500 mg total) by mouth every 6 (six) hours as needed. 06/26/15   Herschell Virani, PA-C  guaiFENesin (ROBITUSSIN) 100 MG/5ML liquid Take 5-10 mLs (100-200 mg total) by mouth every 4 (four) hours as needed for congestion. Patient not taking: Reported on 03/30/2015 03/05/15  Fayrene Helper, PA-C  ibuprofen (ADVIL,MOTRIN) 200 MG tablet Take 1 tablet (200 mg total) by mouth every 6 (six) hours as needed for headache. Patient not taking: Reported on 03/30/2015 03/05/15   Fayrene Helper, PA-C  ondansetron (ZOFRAN) 4 MG tablet Take 1 tablet (4 mg total) by mouth every 6 (six) hours. 06/26/15   Saretta Dahlem, PA-C  ondansetron (ZOFRAN-ODT) 4 MG disintegrating tablet Take 1 tablet (4 mg total) by mouth every 6 (six) hours as needed for nausea. PRN for nausea or vomiting Patient not taking: Reported on 12/10/2014 11/23/14   Graylon Good, PA-C   BP 102/60 mmHg  Pulse 71   Temp(Src) 98.2 F (36.8 C) (Oral)  Resp 18  SpO2 100%  LMP 05/22/2015 Physical Exam  Constitutional: She is oriented to person, place, and time. She appears well-developed and well-nourished. No distress.  HENT:  Head: Normocephalic and atraumatic.  Eyes: Conjunctivae and EOM are normal.  Neck: Normal range of motion.  Cardiovascular: Normal rate, regular rhythm and normal heart sounds.   Pulmonary/Chest: Effort normal and breath sounds normal. No respiratory distress. She has no wheezes. She has no rales.  Abdominal: Soft. Bowel sounds are normal. She exhibits no distension. There is no tenderness. There is no rigidity, no rebound, no guarding and no CVA tenderness.  Musculoskeletal: Normal range of motion.  Pt moves extremities spontaneously. No TTP over lumbar spine. Pt ambulates with steady gait.  Neurological: She is alert and oriented to person, place, and time.  5/5 motor strength of extremities. Sensation to light touch intact throughout  Skin: Skin is warm and dry.  Psychiatric: She has a normal mood and affect. Her behavior is normal.  Nursing note and vitals reviewed.   ED Course  Procedures (including critical care time) Labs Review Labs Reviewed  COMPREHENSIVE METABOLIC PANEL - Abnormal; Notable for the following:    ALT 12 (*)    All other components within normal limits  URINALYSIS, ROUTINE W REFLEX MICROSCOPIC (NOT AT [facility]) - Abnormal; Notable for the following:    APPearance CLOUDY (*)    Specific Gravity, Urine 1.031 (*)    Hgb urine dipstick LARGE (*)    Bilirubin Urine SMALL (*)    Leukocytes, UA TRACE (*)    All other components within normal limits  URINE MICROSCOPIC-ADD ON - Abnormal; Notable for the following:    Squamous Epithelial / LPF FEW (*)    All other components within normal limits  LIPASE, BLOOD  CBC  I-STAT BETA HCG BLOOD, ED ([facility], [facility], [facility] ONLY)  I-STAT CHEM 8, ED    Imaging Review No results found. I have personally reviewed and  evaluated these images and lab results as part of my medical decision-making.   EKG Interpretation None     2:45 - Repeat abdominal exam. Pt reports improvement of symptoms following IVF, tylenol and zofran. Still complaining of lumbar back soreness. Abdomen soft and non-tender. No vomiting or diarrhea in ED. Will PO challenge pt. 3:30 - Repeat abdominal exam. Pt states she is feeling completely better and wishes to go home. Pt successfully drank cup of water and ate [place] sandwich without nausea or vomiting. Abdomen soft, non-tender. Pt stable for discharge  MDM   Final diagnoses:  Gastroenteritis   Pt presenting with NVD x 1 day. Pt also concerned about pregnancy because her period was 4 days late. She is currently menstruating. Pt also complaining of headache and lumbar back "soreness". VSS. Afebrile. Pt nontoxic and resting comfortably. Abdomen soft  and non-tender. Neuro exam is non-focal. No lumbar tenderness or CVA tenderness. Negative urine preg. UA with large Hgb consistent with menstruation. Pt reports complete resolution of symptoms after IVF, tylenol, zofran and some food. Return precautions given in discharge paperwork and discussed with pt. Pt stable for discharge.     Alveta Heimlich, PA-C 06/26/15 1600  Bethann Berkshire, MD 06/27/15 1511

## 2015-06-26 NOTE — ED Notes (Signed)
Pt is in stable condition upon d/c and ambulates from ED. 

## 2015-06-26 NOTE — Discharge Instructions (Signed)

## 2015-06-26 NOTE — ED Notes (Signed)
Pt reports that her period was due on 9/6 and only had light spotting. Also having n/v/d and then this am had moderate vaginal bleeding. Pt wants pregnancy test. Also reports recent dehydration and excessive thirst.

## 2015-09-08 LAB — OB RESULTS CONSOLE RUBELLA ANTIBODY, IGM: Rubella: IMMUNE

## 2015-09-08 LAB — OB RESULTS CONSOLE GC/CHLAMYDIA
Chlamydia: NEGATIVE
Gonorrhea: NEGATIVE

## 2015-09-08 LAB — OB RESULTS CONSOLE ABO/RH: RH Type: POSITIVE

## 2015-09-08 LAB — OB RESULTS CONSOLE HIV ANTIBODY (ROUTINE TESTING): HIV: NONREACTIVE

## 2015-09-08 LAB — CYSTIC FIBROSIS DIAGNOSTIC STUDY: Interpretation-CFDNA:: NEGATIVE

## 2015-09-08 LAB — OB RESULTS CONSOLE PLATELET COUNT: Platelets: 202 10*3/uL

## 2015-09-08 LAB — OB RESULTS CONSOLE HEPATITIS B SURFACE ANTIGEN: Hepatitis B Surface Ag: NEGATIVE

## 2015-09-08 LAB — OB RESULTS CONSOLE HGB/HCT, BLOOD
HCT: 36 %
Hemoglobin: 11.6 g/dL

## 2015-09-08 LAB — OB RESULTS CONSOLE VARICELLA ZOSTER ANTIBODY, IGG: VARICELLA IGG: IMMUNE

## 2015-09-08 LAB — OB RESULTS CONSOLE ANTIBODY SCREEN: Antibody Screen: NEGATIVE

## 2015-09-08 LAB — OB RESULTS CONSOLE RPR: RPR: NONREACTIVE

## 2015-09-22 ENCOUNTER — Other Ambulatory Visit (HOSPITAL_COMMUNITY): Payer: Medicaid Other

## 2015-09-22 ENCOUNTER — Ambulatory Visit (HOSPITAL_COMMUNITY): Payer: Medicaid Other

## 2015-09-24 ENCOUNTER — Other Ambulatory Visit (HOSPITAL_COMMUNITY): Payer: Self-pay | Admitting: Nurse Practitioner

## 2015-09-24 DIAGNOSIS — Z3682 Encounter for antenatal screening for nuchal translucency: Secondary | ICD-10-CM

## 2015-09-24 DIAGNOSIS — Z3A13 13 weeks gestation of pregnancy: Secondary | ICD-10-CM

## 2015-09-28 ENCOUNTER — Other Ambulatory Visit (HOSPITAL_COMMUNITY): Payer: Self-pay | Admitting: Nurse Practitioner

## 2015-09-28 ENCOUNTER — Ambulatory Visit (HOSPITAL_COMMUNITY): Admission: RE | Admit: 2015-09-28 | Payer: Medicaid Other | Source: Ambulatory Visit

## 2015-09-28 ENCOUNTER — Other Ambulatory Visit (HOSPITAL_COMMUNITY): Payer: Medicaid Other

## 2015-09-28 DIAGNOSIS — Z3A13 13 weeks gestation of pregnancy: Secondary | ICD-10-CM

## 2015-09-28 DIAGNOSIS — Z369 Encounter for antenatal screening, unspecified: Secondary | ICD-10-CM

## 2015-09-30 ENCOUNTER — Encounter: Payer: Medicaid Other | Admitting: Obstetrics & Gynecology

## 2015-10-17 NOTE — L&D Delivery Note (Signed)
Delivery Note At 3:52 PM a viable female was delivered via  (Presentation:vertex ; LOA ).  APGAR:8 , 9; weight  .   Placenta status: Intact, Spontaneous.  Cord:3vc  with the following complications:none .  Cord pH: n/a  Anesthesia: Epidural  Episiotomy:  none Lacerations:  none Suture Repair: n/a Est. Blood Loss150 (mL):    Mom to postpartum.  Baby to Couplet care / Skin to Skin.  Margaret DuskyMarie Lawson 03/19/2016, 4:00 PM

## 2015-10-19 ENCOUNTER — Encounter: Payer: Self-pay | Admitting: *Deleted

## 2015-10-19 DIAGNOSIS — O24419 Gestational diabetes mellitus in pregnancy, unspecified control: Secondary | ICD-10-CM | POA: Insufficient documentation

## 2015-10-19 DIAGNOSIS — F5089 Other specified eating disorder: Secondary | ICD-10-CM | POA: Insufficient documentation

## 2015-10-19 DIAGNOSIS — Z8751 Personal history of pre-term labor: Secondary | ICD-10-CM

## 2015-10-19 DIAGNOSIS — O0990 Supervision of high risk pregnancy, unspecified, unspecified trimester: Secondary | ICD-10-CM | POA: Insufficient documentation

## 2015-10-21 ENCOUNTER — Other Ambulatory Visit: Payer: Self-pay

## 2015-10-21 ENCOUNTER — Encounter: Payer: Medicaid Other | Admitting: Family

## 2015-10-21 ENCOUNTER — Ambulatory Visit (INDEPENDENT_AMBULATORY_CARE_PROVIDER_SITE_OTHER): Payer: Medicaid Other | Admitting: Family

## 2015-10-21 VITALS — BP 117/66 | HR 78 | Wt 96.6 lb

## 2015-10-21 DIAGNOSIS — O09892 Supervision of other high risk pregnancies, second trimester: Secondary | ICD-10-CM

## 2015-10-21 DIAGNOSIS — R87613 High grade squamous intraepithelial lesion on cytologic smear of cervix (HGSIL): Secondary | ICD-10-CM | POA: Diagnosis not present

## 2015-10-21 DIAGNOSIS — R87610 Atypical squamous cells of undetermined significance on cytologic smear of cervix (ASC-US): Secondary | ICD-10-CM | POA: Insufficient documentation

## 2015-10-21 DIAGNOSIS — O0992 Supervision of high risk pregnancy, unspecified, second trimester: Secondary | ICD-10-CM

## 2015-10-21 DIAGNOSIS — R87810 Cervical high risk human papillomavirus (HPV) DNA test positive: Secondary | ICD-10-CM

## 2015-10-21 DIAGNOSIS — O09212 Supervision of pregnancy with history of pre-term labor, second trimester: Secondary | ICD-10-CM | POA: Diagnosis not present

## 2015-10-21 DIAGNOSIS — O24419 Gestational diabetes mellitus in pregnancy, unspecified control: Secondary | ICD-10-CM

## 2015-10-21 DIAGNOSIS — O09219 Supervision of pregnancy with history of pre-term labor, unspecified trimester: Secondary | ICD-10-CM | POA: Insufficient documentation

## 2015-10-21 LAB — COMPREHENSIVE METABOLIC PANEL
ALT: 6 U/L (ref 6–29)
AST: 11 U/L (ref 10–30)
Albumin: 3.7 g/dL (ref 3.6–5.1)
Alkaline Phosphatase: 44 U/L (ref 33–115)
BUN: 7 mg/dL (ref 7–25)
CHLORIDE: 103 mmol/L (ref 98–110)
CO2: 23 mmol/L (ref 20–31)
Calcium: 8.9 mg/dL (ref 8.6–10.2)
Creat: 0.49 mg/dL — ABNORMAL LOW (ref 0.50–1.10)
GLUCOSE: 79 mg/dL (ref 65–99)
Potassium: 4.1 mmol/L (ref 3.5–5.3)
SODIUM: 135 mmol/L (ref 135–146)
TOTAL PROTEIN: 6.7 g/dL (ref 6.1–8.1)
Total Bilirubin: 0.4 mg/dL (ref 0.2–1.2)

## 2015-10-21 LAB — POCT URINALYSIS DIP (DEVICE)
Bilirubin Urine: NEGATIVE
GLUCOSE, UA: NEGATIVE mg/dL
Hgb urine dipstick: NEGATIVE
KETONES UR: NEGATIVE mg/dL
Leukocytes, UA: NEGATIVE
Nitrite: NEGATIVE
PH: 6 (ref 5.0–8.0)
PROTEIN: NEGATIVE mg/dL
Urobilinogen, UA: 1 mg/dL (ref 0.0–1.0)

## 2015-10-21 LAB — TSH: TSH: 4.348 u[IU]/mL (ref 0.350–4.500)

## 2015-10-21 MED ORDER — GLUCOSE BLOOD VI STRP: ORAL_STRIP | Status: DC

## 2015-10-21 MED ORDER — ACCU-CHEK NANO SMARTVIEW W/DEVICE KIT: 1.0000 [IU] | PACK | Freq: Four times a day (QID) | Status: DC

## 2015-10-21 MED ORDER — ACCU-CHEK FASTCLIX LANCETS MISC: 1.0000 [IU] | Freq: Four times a day (QID) | Status: DC

## 2015-10-21 NOTE — Patient Instructions (Signed)
Gestational Diabetes Mellitus Gestational diabetes mellitus, often simply referred to as gestational diabetes, is a type of diabetes that some women develop during pregnancy. In gestational diabetes, the pancreas does not make enough insulin (a hormone), the cells are less responsive to the insulin that is made (insulin resistance), or both.Normally, insulin moves sugars from food into the tissue cells. The tissue cells use the sugars for energy. The lack of insulin or the lack of normal response to insulin causes excess sugars to build up in the blood instead of going into the tissue cells. As a result, high blood sugar (hyperglycemia) develops. The effect of high sugar (glucose) levels can cause many problems.  RISK FACTORS You have an increased chance of developing gestational diabetes if you have a family history of diabetes and also have one or more of the following risk factors:  A body mass index over 30 (obesity).  A previous pregnancy with gestational diabetes.  An older age at the time of pregnancy. If blood glucose levels are kept in the normal range during pregnancy, women can have a healthy pregnancy. If your blood glucose levels are not well controlled, there may be risks to you, your unborn baby (fetus), your labor and delivery, or your newborn baby.  SYMPTOMS  If symptoms are experienced, they are much like symptoms you would normally expect during pregnancy. The symptoms of gestational diabetes include:   Increased thirst (polydipsia).  Increased urination (polyuria).  Increased urination during the night (nocturia).  Weight loss. This weight loss may be rapid.  Frequent, recurring infections.  Tiredness (fatigue).  Weakness.  Vision changes, such as blurred vision.  Fruity smell to your breath.  Abdominal pain. DIAGNOSIS Diabetes is diagnosed when blood glucose levels are increased. Your blood glucose level may be checked by one or more of the following blood  tests:  A fasting blood glucose test. You will not be allowed to eat for at least 8 hours before a blood sample is taken.  A random blood glucose test. Your blood glucose is checked at any time of the day regardless of when you ate.  An oral glucose tolerance test (OGTT). Your blood glucose is measured after you have not eaten (fasted) for 1-3 hours and then after you drink a glucose-containing beverage. Since the hormones that cause insulin resistance are highest at about 24-28 weeks of a pregnancy, an OGTT is usually performed during that time. If you have risk factors, you may be screened for undiagnosed type 2 diabetes at your first prenatal visit. TREATMENT  Gestational diabetes should be managed first with diet and exercise. Medicines may be added only if they are needed.  You will need to take diabetes medicine or insulin daily to keep blood glucose levels in the desired range.  You will need to match insulin dosing with exercise and healthy food choices. If you have gestational diabetes, your treatment goal is to maintain the following blood glucose levels:  Before meals (preprandial): at or below 95 mg/dL.  After meals (postprandial):  One hour after a meal: at or below 140 mg/dL.  Two hours after a meal: at or below 120 mg/dL. If you have pre-existing type 1 or type 2 diabetes, your treatment goal is to maintain the following blood glucose levels:  Before meals, at bedtime, and overnight: 60-99 mg/dL.  After meals: peak of 100-129 mg/dL. HOME CARE INSTRUCTIONS   Have your hemoglobin A1c level checked twice a year.  Perform daily blood glucose monitoring as directed by   your health care provider. It is common to perform frequent blood glucose monitoring.  Monitor urine ketones when you are ill and as directed by your health care provider.  Take your diabetes medicine and insulin as directed by your health care provider to maintain your blood glucose level in the desired  range.  Never run out of diabetes medicine or insulin. It is needed every day.  Adjust insulin based on your intake of carbohydrates. Carbohydrates can raise blood glucose levels but need to be included in your diet. Carbohydrates provide vitamins, minerals, and fiber which are an essential part of a healthy diet. Carbohydrates are found in fruits, vegetables, whole grains, dairy products, legumes, and foods containing added sugars.  Eat healthy foods. Alternate 3 meals with 3 snacks.  Maintain a healthy weight gain. The usual total expected weight gain varies according to your prepregnancy body mass index (BMI).  Carry a medical alert card or wear your medical alert jewelry.  Carry a 15-gram carbohydrate snack with you at all times to treat low blood glucose (hypoglycemia). Some examples of 15-gram carbohydrate snacks include:  Glucose tablets, 3 or 4.  Glucose gel, 15-gram tube.  Raisins, 2 tablespoons (24 g).  Jelly beans, 6.  Animal crackers, 8.  Fruit juice, regular soda, or low-fat milk, 4 ounces (120 mL).  Gummy treats, 9.  Recognize hypoglycemia. Hypoglycemia during pregnancy occurs with blood glucose levels of 60 mg/dL and below. The risk for hypoglycemia increases when fasting or skipping meals, during or after intense exercise, and during sleep. Hypoglycemia symptoms can include:  Tremors or shakes.  Decreased ability to concentrate.  Sweating.  Increased heart rate.  Headache.  Dry mouth.  Hunger.  Irritability.  Anxiety.  Restless sleep.  Altered speech or coordination.  Confusion.  Treat hypoglycemia promptly. If you are alert and able to safely swallow, follow the 15:15 rule:  Take 15-20 grams of rapid-acting glucose or carbohydrate. Rapid-acting options include glucose gel, glucose tablets, or 4 ounces (120 mL) of fruit juice, regular soda, or low-fat milk.  Check your blood glucose level 15 minutes after taking the glucose.  Take 15-20  grams more of glucose if the repeat blood glucose level is still 70 mg/dL or below.  Eat a meal or snack within 1 hour once blood glucose levels return to normal.  Be alert to polyuria (excess urination) and polydipsia (excess thirst) which are early signs of hyperglycemia. An early awareness of hyperglycemia allows for prompt treatment. Treat hyperglycemia as directed by your health care provider.  Engage in at least 30 minutes of physical activity a day or as directed by your health care provider. Ten minutes of physical activity timed 30 minutes after each meal is encouraged to control postprandial blood glucose levels.  Adjust your insulin dosing and food intake as needed if you start a new exercise or sport.  Follow your sick-day plan at any time you are unable to eat or drink as usual.  Avoid tobacco and alcohol use.  Keep all follow-up visits as directed by your health care provider.  Follow the advice of your health care provider regarding your prenatal and post-delivery (postpartum) appointments, meal planning, exercise, medicines, vitamins, blood tests, other medical tests, and physical activities.  Perform daily skin and foot care. Examine your skin and feet daily for cuts, bruises, redness, nail problems, bleeding, blisters, or sores.  Brush your teeth and gums at least twice a day and floss at least once a day. Follow up with your dentist   regularly.  Schedule an eye exam during the first trimester of your pregnancy or as directed by your health care provider.  Share your diabetes management plan with your workplace or school.  Stay up-to-date with immunizations.  Learn to manage stress.  Obtain ongoing diabetes education and support as needed.  Learn about and consider breastfeeding your baby.  You should have your blood sugar level checked 6-12 weeks after delivery. This is done with an oral glucose tolerance test (OGTT). SEEK MEDICAL CARE IF:   You are unable to  eat food or drink fluids for more than 6 hours.  You have nausea and vomiting for more than 6 hours.  You have a blood glucose level of 200 mg/dL and you have ketones in your urine.  There is a change in mental status.  You develop vision problems.  You have a persistent headache.  You have upper abdominal pain or discomfort.  You develop an additional serious illness.  You have diarrhea for more than 6 hours.  You have been sick or have had a fever for a couple of days and are not getting better. SEEK IMMEDIATE MEDICAL CARE IF:   You have difficulty breathing.  You no longer feel the baby moving.  You are bleeding or have discharge from your vagina.  You start having premature contractions or labor. MAKE SURE YOU:  Understand these instructions.  Will watch your condition.  Will get help right away if you are not doing well or get worse.   This information is not intended to replace advice given to you by your health care provider. Make sure you discuss any questions you have with your health care provider.   Document Released: 01/08/2001 Document Revised: 10/23/2014 Document Reviewed: 04/30/2012 Elsevier Interactive Patient Education 2016 [employer]  Second Trimester of Pregnancy The second trimester is from week 13 through week 28, months 4 through 6. The second trimester is often a time when you feel your best. Your body has also adjusted to being pregnant, and you begin to feel better physically. Usually, morning sickness has lessened or quit completely, you may have more energy, and you may have an increase in appetite. The second trimester is also a time when the fetus is growing rapidly. At the end of the sixth month, the fetus is about 9 inches long and weighs about 1 pounds. You will likely begin to feel the baby move (quickening) between 18 and 20 weeks of the pregnancy. BODY CHANGES Your body goes through many changes during pregnancy. The changes vary  from woman to woman.   Your weight will continue to increase. You will notice your lower abdomen bulging out.  You may begin to get stretch marks on your hips, abdomen, and breasts.  You may develop headaches that can be relieved by medicines approved by your health care provider.  You may urinate more often because the fetus is pressing on your bladder.  You may develop or continue to have heartburn as a result of your pregnancy.  You may develop constipation because certain hormones are causing the muscles that push waste through your intestines to slow down.  You may develop hemorrhoids or swollen, bulging veins (varicose veins).  You may have back pain because of the weight gain and pregnancy hormones relaxing your joints between the bones in your pelvis and as a result of a shift in weight and the muscles that support your balance.  Your breasts will continue to grow and be tender.  Your gums may bleed and may be sensitive to brushing and flossing.  Dark spots or blotches (chloasma, mask of pregnancy) may develop on your face. This will likely fade after the baby is born.  A dark line from your belly button to the pubic area (linea nigra) may appear. This will likely fade after the baby is born.  You may have changes in your hair. These can include thickening of your hair, rapid growth, and changes in texture. Some women also have hair loss during or after pregnancy, or hair that feels dry or thin. Your hair will most likely return to normal after your baby is born. WHAT TO EXPECT AT YOUR PRENATAL VISITS During a routine prenatal visit:  You will be weighed to make sure you and the fetus are growing normally.  Your blood pressure will be taken.  Your abdomen will be measured to track your baby's growth.  The fetal heartbeat will be listened to.  Any test results from the previous visit will be discussed. Your health care provider may ask you:  How you are feeling.  If  you are feeling the baby move.  If you have had any abnormal symptoms, such as leaking fluid, bleeding, severe headaches, or abdominal cramping.  If you are using any tobacco products, including cigarettes, chewing tobacco, and electronic cigarettes.  If you have any questions. Other tests that may be performed during your second trimester include:  Blood tests that check for:  Low iron levels (anemia).  Gestational diabetes (between 24 and 28 weeks).  Rh antibodies.  Urine tests to check for infections, diabetes, or protein in the urine.  An ultrasound to confirm the proper growth and development of the baby.  An amniocentesis to check for possible genetic problems.  Fetal screens for spina bifida and Down syndrome.  HIV (human immunodeficiency virus) testing. Routine prenatal testing includes screening for HIV, unless you choose not to have this test. HOME CARE INSTRUCTIONS   Avoid all smoking, herbs, alcohol, and unprescribed drugs. These chemicals affect the formation and growth of the baby.  Do not use any tobacco products, including cigarettes, chewing tobacco, and electronic cigarettes. If you need help quitting, ask your health care provider. You may receive counseling support and other resources to help you quit.  Follow your health care provider's instructions regarding medicine use. There are medicines that are either safe or unsafe to take during pregnancy.  Exercise only as directed by your health care provider. Experiencing uterine cramps is a good sign to stop exercising.  Continue to eat regular, healthy meals.  Wear a good support bra for breast tenderness.  Do not use hot tubs, steam rooms, or saunas.  Wear your seat belt at all times when driving.  Avoid raw meat, uncooked cheese, cat litter boxes, and soil used by cats. These carry germs that can cause birth defects in the baby.  Take your prenatal vitamins.  Take 1500-2000 mg of calcium daily  starting at the 20th week of pregnancy until you deliver your baby.  Try taking a stool softener (if your health care provider approves) if you develop constipation. Eat more high-fiber foods, such as fresh vegetables or fruit and whole grains. Drink plenty of fluids to keep your urine clear or pale yellow.  Take warm sitz baths to soothe any pain or discomfort caused by hemorrhoids. Use hemorrhoid cream if your health care provider approves.  If you develop varicose veins, wear support hose. Elevate your feet for 15 minutes,  3-4 times a day. Limit salt in your diet.  Avoid heavy lifting, wear low heel shoes, and practice good posture.  Rest with your legs elevated if you have leg cramps or low back pain.  Visit your dentist if you have not gone yet during your pregnancy. Use a soft toothbrush to brush your teeth and be gentle when you floss.  A sexual relationship may be continued unless your health care provider directs you otherwise.  Continue to go to all your prenatal visits as directed by your health care provider. SEEK MEDICAL CARE IF:   You have dizziness.  You have mild pelvic cramps, pelvic pressure, or nagging pain in the abdominal area.  You have persistent nausea, vomiting, or diarrhea.  You have a bad smelling vaginal discharge.  You have pain with urination. SEEK IMMEDIATE MEDICAL CARE IF:   You have a fever.  You are leaking fluid from your vagina.  You have spotting or bleeding from your vagina.  You have severe abdominal cramping or pain.  You have rapid weight gain or loss.  You have shortness of breath with chest pain.  You notice sudden or extreme swelling of your face, hands, ankles, feet, or legs.  You have not felt your baby move in over an hour.  You have severe headaches that do not go away with medicine.  You have vision changes.   This information is not intended to replace advice given to you by your health care provider. Make sure you  discuss any questions you have with your health care provider.   Document Released: 09/26/2001 Document Revised: 10/23/2014 Document Reviewed: 12/03/2012 Elsevier Interactive Patient Education [employer]

## 2015-10-21 NOTE — Progress Notes (Signed)
Makena application completed and faxed.

## 2015-10-21 NOTE — Progress Notes (Signed)
Nutrition note: 1st visit consult & GDM diet education Pt was recently diagnosed with GDM & has h/o being underwt prior to pregnancy. First thing pt asked was if [facility] because she is worried about her wt gain - [facility] about diagnoses of when they are allowed to give these to clients and that weight gain is not one of them because they would rather them use food to increase weight. Pt has gained 4.6# @ 424w5d, which is wnl. Pt reports eating 2 meals & 4 snacks/d. Pt is taking a PNV. Pt reports nausea occ & a lot of heartburn/ acid reflux. NKFA. Pt reports walking ~1hr 2x/wk. Pt received verbal & written education on GDM diet - pt appeared in disbelief of the recommendations for the GDM diet (for example, no juice or limiting CHO servings at breakfast to only 2). Discussed reasons & importance for these recommendations and encouraged her to make small changes gradually. Will have to assess understanding/ compliance at future appointments as pt was on her phone throughout the visit and appeared as though she was not going to make the suggested changes.  Also encouraged pt to include protein at all meals & snacks to help with wt gain. Discussed importance of breastfeeding but pt does not plan to BF or pump.  Discussed tips to decrease heartburn but pt reported that she did not plan to make these suggestions and asked about medication to stop her acid reflux- encouraged her to discuss with her provider. Discussed wt gain goals of 28-40# or 1#/wk. Pt agrees to try to eat protein with all meals & snacks. Pt has WIC & does not plan to BF. F/u in 2-4 wks Margaret RevealLaura Cache Decoursey, MS, RD, LDN, [facility]

## 2015-10-21 NOTE — Progress Notes (Signed)
Anatomy U/S 11/04/15 @ 830a.

## 2015-10-21 NOTE — Progress Notes (Signed)
Subjective:    Rosalie GumsMeghan M Woodhams is a [id number] being seen today for her first obstetrical visit.  Her obstetrical history is significant for smoker and gestational diabetes, history of preterm delivery, SGA prior birth.. Patient does intend to breast feed. Pregnancy history fully reviewed.  Patient reports no complaints.  Filed Vitals:   10/21/15 0834  BP: 117/66  Pulse: 78  Weight: 96 lb 9.6 oz (43.817 kg)    HISTORY: OB History  Gravida Para Term Preterm AB SAB TAB Ectopic Multiple Living  7 5 4 1 1 1  0 0 0 5    # Outcome Date GA Lbr Len/2nd Weight Sex Delivery Anes PTL Lv  7 Current           6 Term 09/08/14 4951w4d 03:40 / 00:07 6 lb 15.3 oz (3.155 kg) F Vag-Spont None  Y  5 Term 09/20/13 6065w5d 04:10 / 00:31 7 lb (3.175 kg) F Vag-Spont EPI  Y  4 Preterm 07/17/12 9155w3d 19:05 / 00:12 4 lb 5.7 oz (1.975 kg) M Vag-Spont EPI  Y  3 SAB 10/05/11          2 Term 10/15/10 4569w0d  7 lb 2 oz (3.232 kg) M Vag-Spont   Y  1 Term 2005 288w0d  5 lb 7 oz (2.466 kg) M Vag-Spont   Y     Past Medical History  Diagnosis Date  . Anemia   . Headache(784.0)   . Urinary tract infection   . Pregnancy induced hypertension   . Hx of chlamydia infection   . Depression     ppd with first child  . Vaginal Pap smear, abnormal     HSIL 06/16/15- Needs colpo   Past Surgical History  Procedure Laterality Date  . No past surgeries     Family History  Problem Relation Age of Onset  . Anesthesia problems Neg Hx   . Hypotension Neg Hx   . Malignant hyperthermia Neg Hx   . Pseudochol deficiency Neg Hx   . Other Neg Hx   . Cancer Neg Hx   . Heart disease Neg Hx   . Stroke Neg Hx   . Hearing loss Neg Hx   . Asthma Son   . Asthma Paternal Aunt      Exam   Filed Vitals:   10/21/15 0834  BP: 117/66  Pulse: 78  Weight: 96 lb 9.6 oz (43.817 kg)    Fetal Status: Fetal Heart Rate (bpm): 150 Fundal Height: 15 cm Movement: Present     General:  Alert, oriented and cooperative. Patient is  in no acute distress.  Skin: Skin is warm and dry. No rash noted.   Cardiovascular: Normal heart rate noted  Respiratory: Normal respiratory effort, no problems with respiration noted  Abdomen: Soft, gravid, appropriate for gestational age. Pain/Pressure: Absent     Pelvic: Vag. Bleeding: None Vag D/C Character: White   Cervical exam deferred        Extremities: Normal range of motion.  Edema: None  Mental Status: Normal mood and affect. Normal behavior. Normal judgment and thought content.   Urinalysis: Urine Protein: Negative Urine Glucose: Negative     Assessment:    Pregnancy: [id number] Patient Active Problem List   Diagnosis Date Noted  . Previous preterm delivery, antepartum 10/21/2015  . HSIL (high grade squamous intraepithelial lesion) on Pap smear of cervix 10/21/2015  . Supervision of high-risk pregnancy 10/19/2015  . Gestational diabetes mellitus (GDM), antepartum 10/19/2015  . Pica 10/19/2015  .  History of preterm delivery 10/19/2015  . Smoker 12/10/2014        Plan:     Reviewed prenatal records. Prenatal vitamins. Discussed implications of DM in pregnancy, need for optimizing glycemic control to decrease DM associated maternal-fetal morbidity and mortality, Will check baseline labs today since diagnosis early in pregnancy, get DM education and DM testing supplies today. Complete 17-p application Problem list reviewed and updated. Genetic Screening discussed Quad Screen: ordered.  Ultrasound discussed; fetal survey: ordered.  Follow up on Monday for diabetic counseling; 2 weeks for colpo/appt. Nutrition appt today  Marlis Edelson 10/21/2015

## 2015-10-22 LAB — PROTEIN / CREATININE RATIO, URINE
Creatinine, Urine: 201 mg/dL (ref 20–320)
Protein Creatinine Ratio: 85 mg/g creat (ref 21–161)
TOTAL PROTEIN, URINE: 17 mg/dL (ref 5–24)

## 2015-10-22 LAB — HEMOGLOBIN A1C
Hgb A1c MFr Bld: 5.2 % (ref <5.7)
MEAN PLASMA GLUCOSE: 103 mg/dL (ref <117)

## 2015-10-25 ENCOUNTER — Ambulatory Visit: Payer: Medicaid Other

## 2015-10-26 ENCOUNTER — Other Ambulatory Visit: Payer: Self-pay

## 2015-10-26 ENCOUNTER — Telehealth: Payer: Self-pay | Admitting: *Deleted

## 2015-10-26 ENCOUNTER — Telehealth: Payer: Self-pay

## 2015-10-26 LAB — AFP, QUAD SCREEN
AFP: 43.1 ng/mL
Age Alone: 1:842 {titer}
CURR GEST AGE: 16.5 wks.days
HCG TOTAL: 67.87 [IU]/mL
INH: 338.2 pg/mL
Interpretation-AFP: NEGATIVE
MOM FOR INH: 1.71
MoM for AFP: 0.84
MoM for hCG: 1.27
Open Spina bifida: NEGATIVE
Osb Risk: 1:47200 {titer}
Tri 18 Scr Risk Est: NEGATIVE
Trisomy 18 (Edward) Syndrome Interp.: 1:35800 {titer}
uE3 Mom: 0.78
uE3 Value: 0.82 ng/mL

## 2015-10-26 NOTE — Telephone Encounter (Signed)
Pt called with concerns about not getting her blood glucose meter according to records we sent to her pharmacy on 10/21/2015. I called the pharmacy and they stated the meter had to be ordered and would be available tomorrow 10/27/2015

## 2015-10-26 NOTE — Telephone Encounter (Signed)
[facility] called to inform us that patients Margaret Cain is covered and has been submitted to the compounding pharmacy to dispense to us. No further action required on our part.

## 2015-10-29 ENCOUNTER — Telehealth: Payer: Self-pay

## 2015-10-29 NOTE — Telephone Encounter (Signed)
Pt called wanting know what the colp procedure consist off and would she have to stay over night. I informed patient this is an outpatient procedure and depending if the provider does a bisopy she may have some mild cramping. Pt also stated she had some mild cramping this morning and she was concerned. I advised patient that if this continued to be bothersome or gets worse she can go to [facility] to be check out. Pt verbalizes understanding.

## 2015-11-01 ENCOUNTER — Ambulatory Visit: Payer: Medicaid Other

## 2015-11-03 ENCOUNTER — Encounter: Payer: Medicaid Other | Admitting: Family

## 2015-11-04 ENCOUNTER — Other Ambulatory Visit: Payer: Self-pay | Admitting: Family

## 2015-11-04 ENCOUNTER — Ambulatory Visit (HOSPITAL_COMMUNITY)
Admission: RE | Admit: 2015-11-04 | Discharge: 2015-11-04 | Disposition: A | Discharge status: Home / Self Care | Payer: Medicaid Other | Source: Ambulatory Visit | Attending: Family | Admitting: Family

## 2015-11-04 ENCOUNTER — Encounter: Payer: Medicaid Other | Admitting: Obstetrics & Gynecology

## 2015-11-04 DIAGNOSIS — O350XX0 Maternal care for (suspected) central nervous system malformation in fetus, not applicable or unspecified: Secondary | ICD-10-CM

## 2015-11-04 DIAGNOSIS — O283 Abnormal ultrasonic finding on antenatal screening of mother: Secondary | ICD-10-CM

## 2015-11-04 DIAGNOSIS — Z3A18 18 weeks gestation of pregnancy: Secondary | ICD-10-CM

## 2015-11-04 DIAGNOSIS — O09212 Supervision of pregnancy with history of pre-term labor, second trimester: Secondary | ICD-10-CM | POA: Diagnosis not present

## 2015-11-04 DIAGNOSIS — O09892 Supervision of other high risk pregnancies, second trimester: Secondary | ICD-10-CM | POA: Diagnosis not present

## 2015-11-04 DIAGNOSIS — O3509X0 Maternal care for (suspected) other central nervous system malformation or damage in fetus, not applicable or unspecified: Secondary | ICD-10-CM | POA: Insufficient documentation

## 2015-11-04 DIAGNOSIS — Z315 Encounter for genetic counseling: Secondary | ICD-10-CM | POA: Insufficient documentation

## 2015-11-04 DIAGNOSIS — O09292 Supervision of pregnancy with other poor reproductive or obstetric history, second trimester: Secondary | ICD-10-CM | POA: Diagnosis not present

## 2015-11-04 DIAGNOSIS — O24410 Gestational diabetes mellitus in pregnancy, diet controlled: Secondary | ICD-10-CM | POA: Insufficient documentation

## 2015-11-04 DIAGNOSIS — O0992 Supervision of high risk pregnancy, unspecified, second trimester: Secondary | ICD-10-CM

## 2015-11-04 NOTE — Progress Notes (Signed)
Genetic Counseling  High-Risk Gestation Note  Appointment Date:  11/04/2015 Referred By: Rochele Pages, CNM Date of Birth:  Nov 04, 1985   Pregnancy History: [id number] Estimated Date of Delivery: 04/01/16 Estimated Gestational Age: [redacted]w[redacted]d Attending: Ledon Snare, MD   [name] was seen for genetic counseling because of abnormal ultrasound findings today.   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) visualized on today's anatomy ultrasound  Previous Quad screen within normal range (Down syndrome risk of 1 in 670)  Today's ultrasound findings would increase the risk for Down syndrome to approximately 1 in 74 (1.4%)  Patient is considering NIPS ([facility]) but could not stay for blood draw today due to childcare constraints; may pursue at follow-up ultrasound  May also consider CMV and Toxo maternal serology at a later date  Declined amniocentesis  Discussed other explanations for ventriculomegaly including variation of normal, infections, blockage, and overproduction of fluid  Follow-up ultrasound scheduled for 12/02/15  [name] was seen today for anatomy ultrasound.  Ultrasound revealed bilateral, mild cerebral ventriculomegaly (12 mm).  The ultrasound report will be sent under separate cover.    We discussed that the second trimester genetic sonogram is targeted at identifying features associated with aneuploidy.  It has evolved as a screening tool used to provide an individualized risk assessment for Down syndrome and other trisomies.  The ability of sonography to aid in the detection of aneuploidies relies on identification of both major structural anomalies and "soft markers."  The patient was counseled that the latter term refers to findings that are often normal variants and do not cause any significant medical problems.  Nonetheless, these markers have a known association with aneuploidy.    We discussed that ultrasound performed today  confirmed the pregnancy to be [redacted]w[redacted]d gestation. Bilateral ventriculomegaly was visualized at this time. The right and left ventricles measured 12 mm. The remaining visualized fetal anatomy appeared normal. We discussed that ultrasound does not detect or rule out all birth defects prenatally. We counseled the patient that ventriculomegaly refers to an increased measurement of the lateral ventricles in the brain greater than 10 mm.  Possible causes for mild ventriculomegaly include overproduction of cerebrospinal fluid, a blockage in a duct causing the fluid to accumulate, an intrauterine infection, or a variation of normal. We discussed that once ventriculomegaly is identified in pregnancy, follow-up ultrasounds are offered to assess for progression of ventriculomegaly. Following delivery, post-natal studies may be required in order to track progress and assess for any other differences in brain anatomy. It was discussed that surgical intervention may be required in some cases of ventriculomegaly. Most cases of isolated mild ventriculomegaly do not have an identified cause or associated condition, tend to regress with time, and have no resulting health or learning problems. However, it is possible that this finding may be seen in association with other ultrasound differences or a genetic condition. There is also a slightly increased chance of developmental delays. We discussed that the presence of ventriculomegaly is associated with an increased risk for aneuploidy, specifically Down syndrome.   We reviewed chromosomes, nondisjunction, and the features and prognosis of Down syndrome. She was counseled regarding maternal age and the association with risk for chromosome conditions due to nondisjunction with aging of ova. [name] previously had Quad screening, which was within normal range for Down syndrome (1 in 670 risk). The ultrasound finding of mild ventriculomegaly would increase the risk for Down  syndrome from Quad screen risk  to approximately 1 in 74 (1.4%).   We reviewed other available screening and diagnostic options including noninvasive prenatal screening (NIPS)/cell free fetal DNA (cffDNA) testing, and amniocentesis.  She was counseled regarding the benefits and limitations of each option.  We reviewed the approximate 1 in 300-500 risk for complications for amniocentesis, including spontaneous pregnancy loss. After consideration of all the options, she expressed interest in pursuing NIPS but was unable to stay for blood draw today due to childcare time constraints.  She declined amniocentesis. She may consider pursuing NIPS at the time of her follow-up appointment but understands that this can be performed prior to that time, if she desires NIPS sooner. Additionally, we discussed that maternal serology to assess for infections (CMV, toxoplasmosis) could also be performed, if desired. A follow-up ultrasound was planned in 4 weeks.   Family history information was not able to be obtained for review today for risk assessment given that the patient was not originally scheduled for genetic counseling and given her time constraints today.   I counseled [name] regarding the above risks and available options.  The approximate face-to-face time with the genetic counselor was 20 minutes.     Clydie Braun Silveria Botz 11/04/2015

## 2015-11-08 ENCOUNTER — Ambulatory Visit: Payer: Medicaid Other

## 2015-11-15 ENCOUNTER — Ambulatory Visit (INDEPENDENT_AMBULATORY_CARE_PROVIDER_SITE_OTHER): Payer: Medicaid Other | Admitting: Obstetrics and Gynecology

## 2015-11-15 ENCOUNTER — Other Ambulatory Visit (HOSPITAL_COMMUNITY)
Admission: RE | Admit: 2015-11-15 | Discharge: 2015-11-15 | Disposition: A | Discharge status: Home / Self Care | Payer: Medicaid Other | Source: Ambulatory Visit | Attending: Obstetrics and Gynecology | Admitting: Obstetrics and Gynecology

## 2015-11-15 ENCOUNTER — Encounter: Payer: Self-pay | Admitting: Obstetrics and Gynecology

## 2015-11-15 VITALS — BP 113/59 | HR 72 | Temp 98.1°F | Wt 104.2 lb

## 2015-11-15 DIAGNOSIS — Z3A20 20 weeks gestation of pregnancy: Secondary | ICD-10-CM | POA: Diagnosis not present

## 2015-11-15 DIAGNOSIS — O0992 Supervision of high risk pregnancy, unspecified, second trimester: Secondary | ICD-10-CM

## 2015-11-15 DIAGNOSIS — O26892 Other specified pregnancy related conditions, second trimester: Secondary | ICD-10-CM | POA: Insufficient documentation

## 2015-11-15 DIAGNOSIS — O24410 Gestational diabetes mellitus in pregnancy, diet controlled: Secondary | ICD-10-CM

## 2015-11-15 DIAGNOSIS — R87613 High grade squamous intraepithelial lesion on cytologic smear of cervix (HGSIL): Secondary | ICD-10-CM | POA: Diagnosis not present

## 2015-11-15 DIAGNOSIS — N870 Mild cervical dysplasia: Secondary | ICD-10-CM | POA: Diagnosis not present

## 2015-11-15 DIAGNOSIS — O09212 Supervision of pregnancy with history of pre-term labor, second trimester: Secondary | ICD-10-CM | POA: Diagnosis not present

## 2015-11-15 DIAGNOSIS — O350XX0 Maternal care for (suspected) central nervous system malformation in fetus, not applicable or unspecified: Secondary | ICD-10-CM | POA: Diagnosis not present

## 2015-11-15 DIAGNOSIS — O3509X0 Maternal care for (suspected) other central nervous system malformation or damage in fetus, not applicable or unspecified: Secondary | ICD-10-CM

## 2015-11-15 LAB — POCT URINALYSIS DIP (DEVICE)
BILIRUBIN URINE: NEGATIVE
GLUCOSE, UA: NEGATIVE mg/dL
Hgb urine dipstick: NEGATIVE
Leukocytes, UA: NEGATIVE
NITRITE: NEGATIVE
PH: 6 (ref 5.0–8.0)
PROTEIN: NEGATIVE mg/dL
Specific Gravity, Urine: 1.02 (ref 1.005–1.030)
Urobilinogen, UA: 1 mg/dL (ref 0.0–1.0)

## 2015-11-15 MED ORDER — HYDROXYPROGESTERONE CAPROATE 250 MG/ML IM OIL
250.0000 mg | TOPICAL_OIL | INTRAMUSCULAR | Status: AC
  Administered 2015-11-15 – 2015-12-09 (×4): 250 mg via INTRAMUSCULAR

## 2015-11-15 NOTE — Addendum Note (Signed)
Addended by: Gerome Apley on: 11/15/2015 10:38 AM   Modules accepted: Orders

## 2015-11-15 NOTE — Progress Notes (Signed)
Patient left prior to being seen for Diabetes Education.

## 2015-11-15 NOTE — Progress Notes (Signed)
C/o contractions every other day.  

## 2015-11-15 NOTE — Progress Notes (Signed)
Subjective:  Margaret Cain is a 30 y.o. [id number] at [redacted]w[redacted]d being seen today for ongoing prenatal care.  She is currently monitored for the following issues for this high-risk pregnancy and has Smoker; Supervision of high-risk pregnancy; Gestational diabetes mellitus (GDM), antepartum; Pica; History of preterm delivery; Previous preterm delivery, antepartum; HSIL (high grade squamous intraepithelial lesion) on Pap smear of cervix; Pregnancy complicated by fetal cerebral ventriculomegaly; and Abnormal fetal ultrasound on her problem list.  Patient reports occasional contractions.  Contractions: Irregular. Vag. Bleeding: None.  Movement: Present. Denies leaking of fluid.   The following portions of the patient's history were reviewed and updated as appropriate: allergies, current medications, past family history, past medical history, past social history, past surgical history and problem list. Problem list updated.  Objective:   Filed Vitals:   11/15/15 0942  BP: 113/59  Pulse: 72  Temp: 98.1 F (36.7 C)  Weight: 104 lb 3.2 oz (47.265 kg)    Fetal Status: Fetal Heart Rate (bpm): 140 Fundal Height: 20 cm Movement: Present     General:  Alert, oriented and cooperative. Patient is in no acute distress.  Skin: Skin is warm and dry. No rash noted.   Cardiovascular: Normal heart rate noted  Respiratory: Normal respiratory effort, no problems with respiration noted  Abdomen: Soft, gravid, appropriate for gestational age. Pain/Pressure: Present     Pelvic: Vag. Bleeding: None     Cervical exam performed Dilation: Closed Effacement (%): Thick Station: Ballotable  Extremities: Normal range of motion.  Edema: Trace  Mental Status: Normal mood and affect. Normal behavior. Normal judgment and thought content.   Urinalysis:      Assessment and Plan:  Pregnancy: [id number] at [redacted]w[redacted]d  1. Supervision of high-risk pregnancy, second trimester Advised to stay well hydrated  2. Diet controlled gestational  diabetes mellitus (GDM), antepartum Patient has not been checking CBGs as she did not receive education  3. HSIL (high grade squamous intraepithelial lesion) on Pap smear of cervix Patient given informed consent, signed copy in the chart, time out was performed.  Placed in lithotomy position. Cervix viewed with speculum and colposcope after application of acetic acid.   Colposcopy adequate?  TZ not visualized Acetowhite lesions?7 and 10 o'clock Punctation?no Mosaicism?  no Abnormal vasculature?  no Biopsies?7 and 10 o'clock ECC?not performed  4. Pregnancy complicated by fetal cerebral ventriculomegaly, not applicable or unspecified fetus Follow up monthly  5. Previous preterm delivery, antepartum, second trimester Patient to start weekly 17-P  General obstetric precautions including but not limited to vaginal bleeding, contractions, leaking of fluid and fetal movement were reviewed in detail with the patient. Please refer to After Visit Summary for other counseling recommendations.  Return in about 2 weeks (around 11/29/2015).   Catalina Antigua, MD

## 2015-11-22 ENCOUNTER — Ambulatory Visit: Payer: Medicaid Other

## 2015-11-23 ENCOUNTER — Ambulatory Visit (INDEPENDENT_AMBULATORY_CARE_PROVIDER_SITE_OTHER): Payer: Medicaid Other | Admitting: Family

## 2015-11-23 VITALS — BP 108/63 | HR 67 | Temp 98.4°F

## 2015-11-23 DIAGNOSIS — O0992 Supervision of high risk pregnancy, unspecified, second trimester: Secondary | ICD-10-CM

## 2015-11-23 DIAGNOSIS — O09212 Supervision of pregnancy with history of pre-term labor, second trimester: Secondary | ICD-10-CM

## 2015-11-23 MED ORDER — HYDROCORTISONE 1 % EX OINT: 1.0000 "application " | TOPICAL_OINTMENT | Freq: Two times a day (BID) | CUTANEOUS | Status: DC

## 2015-11-23 NOTE — Progress Notes (Signed)
C/o feeling pressure down low and contractions everyday. Also c/o feeling weak and tired and sinus headache. Rochele Pages, CNM in to see patient.

## 2015-11-23 NOTE — Patient Instructions (Signed)

## 2015-11-23 NOTE — Progress Notes (Signed)
History   [id number]   Chief Complaint  Patient presents with  . Injections    HPI Margaret Cain is a 30 y.o. female  [phone] at 32w3dIUP here with report of increased pelvic pressure and reports of 5-6 contractions a day.  Denies vaginal bleeding or leaking of fluid.  Also reports headache rate 6/10 every other day.  Tried Tylenol 325 with little relief.  Pain also radiated to frontal and maxillary sinuses.  In addition, patient reports new dry rash on face x 1 week.  No report of itching.    Patient's last menstrual period was 06/26/2015 (exact date).  OB History  Gravida Para Term Preterm AB SAB TAB Ectopic Multiple Living  '7 5 4 1 1 1 ' 0 0 0 5    # Outcome Date GA Lbr Len/2nd Weight Sex Delivery Anes PTL Lv  7 Current           6 Term 09/08/14 352w4d3:40 / 00:07 6 lb 15.3 oz (3.155 kg) F Vag-Spont None  Y  5 Term 09/20/13 3928w5d:10 / 00:31 7 lb (3.175 kg) F Vag-Spont EPI  Y  4 Preterm 07/17/12 35w78w3d05 / 00:12 4 lb 5.7 oz (1.975 kg) M Vag-Spont EPI  Y  3 SAB 10/05/11          2 Term 10/15/10 40w049w0db 2 oz (3.232 kg) M Vag-Spont   Y  1 Term 2005 74w0d23w0d 7 oz (2.466 kg) M Vag-Spont   Y      Past Medical History  Diagnosis Date  . Anemia   . Headache(784.0)   . Urinary tract infection   . Pregnancy induced hypertension   . Hx of chlamydia infection   . Depression     ppd with first child  . Vaginal Pap smear, abnormal     HSIL 06/16/15- Needs colpo    Family History  Problem Relation Age of Onset  . Anesthesia problems Neg Hx   . Hypotension Neg Hx   . Malignant hyperthermia Neg Hx   . Pseudochol deficiency Neg Hx   . Other Neg Hx   . Cancer Neg Hx   . Heart disease Neg Hx   . Stroke Neg Hx   . Hearing loss Neg Hx   . Asthma Son   . Asthma Paternal Aunt     Social History   Social History  . Marital Status: Single    Spouse Name: N/A  . Number of Children: N/A  . Years of Education: N/A   Social History Main Topics  . Smoking status:  Former Smoker -- 0.25 packs/day  . Smokeless tobacco: Never Used  . Alcohol Use: No  . Drug Use: No  . Sexual Activity: Yes    Birth Control/ Protection: None     Comment: 1 partner   Other Topics Concern  . Not on file   Social History Narrative    No Known Allergies  Current Outpatient Prescriptions on File Prior to Visit  Medication Sig Dispense Refill  . acetaminophen (TYLENOL) 500 MG tablet Take 1 tablet (500 mg total) by mouth every 6 (six) hours as needed. 20 tablet 0  . Prenatal Vit-Fe Fumarate-FA (PRENATAL VITAMINS PLUS PO) Take 1 tablet by mouth daily.    . ACCUMarland KitchenCHEK FASTCLIX LANCETS MISC 1 Units by Does not apply route 4 (four) times daily. (Patient not taking: Reported on 11/23/2015) 2 each 6  . Blood Glucose Monitoring Suppl (ACCU-CRockyvice  KIT 1 Units by Does not apply route QID. (Patient not taking: Reported on 11/23/2015) 1 kit 0  . glucose blood (ACCU-CHEK SMARTVIEW) test strip Use as instructed (Patient not taking: Reported on 11/23/2015) 100 each 12   Current Facility-Administered Medications on File Prior to Visit  Medication Dose Route Frequency Provider Last Rate Last Dose  . hydroxyprogesterone caproate (DELALUTIN) 250 mg/mL injection 250 mg  250 mg Intramuscular Weekly Peggy Constant, MD   250 mg at 11/23/15 0946     Review of Systems  Genitourinary: Positive for pelvic pain (pressure). Negative for vaginal bleeding and vaginal discharge.  Skin: Positive for rash (facial).  Neurological: Positive for headaches.  All other systems reviewed and are negative.    Physical Exam   Filed Vitals:   11/23/15 0935  BP: 108/63  Pulse: 67  Temp: 98.4 F (36.9 C)    Physical Exam  Constitutional: She is oriented to person, place, and time. She appears well-developed and well-nourished.  HENT:  Head: Normocephalic.  Neck: Normal range of motion. Neck supple.  Cardiovascular: Normal rate, regular rhythm and normal heart sounds.     Respiratory: Effort normal and breath sounds normal. No respiratory distress.  GI: Soft. There is no tenderness.  Genitourinary: No bleeding in the vagina. Vaginal discharge (mucusy) found.  Cervix closed/long  Musculoskeletal: Normal range of motion. She exhibits no edema.  Neurological: She is alert and oriented to person, place, and time.  Skin: Skin is warm and dry.      Fetal heart tones - 152  Assessment and Plan  30 y.o. [id number] at 38w3dIUP  Pelvic Pressure - Normal exam Headaches Eczema  Plan: 17-p in office today Reviewed signs of preterm labor and when to return Increase Tylenol to 500 mg RX hydrocortisone 2% Keep scheduled prenatal care appt  WGwen Pounds CNM 11/23/2015 9:57 AM

## 2015-11-27 ENCOUNTER — Inpatient Hospital Stay (HOSPITAL_COMMUNITY)
Admission: AD | Admit: 2015-11-27 | Discharge: 2015-11-28 | Disposition: A | Discharge status: Home / Self Care | Payer: Medicaid Other | Source: Ambulatory Visit | Attending: Family Medicine | Admitting: Family Medicine

## 2015-11-27 DIAGNOSIS — M545 Low back pain, unspecified: Secondary | ICD-10-CM

## 2015-11-27 DIAGNOSIS — Z87891 Personal history of nicotine dependence: Secondary | ICD-10-CM | POA: Insufficient documentation

## 2015-11-27 DIAGNOSIS — R109 Unspecified abdominal pain: Secondary | ICD-10-CM | POA: Insufficient documentation

## 2015-11-27 DIAGNOSIS — O09212 Supervision of pregnancy with history of pre-term labor, second trimester: Secondary | ICD-10-CM

## 2015-11-27 DIAGNOSIS — M549 Dorsalgia, unspecified: Secondary | ICD-10-CM | POA: Insufficient documentation

## 2015-11-27 DIAGNOSIS — O3509X0 Maternal care for (suspected) other central nervous system malformation or damage in fetus, not applicable or unspecified: Secondary | ICD-10-CM

## 2015-11-27 DIAGNOSIS — Z8632 Personal history of gestational diabetes: Secondary | ICD-10-CM | POA: Insufficient documentation

## 2015-11-27 DIAGNOSIS — O0992 Supervision of high risk pregnancy, unspecified, second trimester: Secondary | ICD-10-CM

## 2015-11-27 DIAGNOSIS — O283 Abnormal ultrasonic finding on antenatal screening of mother: Secondary | ICD-10-CM

## 2015-11-27 DIAGNOSIS — O350XX0 Maternal care for (suspected) central nervous system malformation in fetus, not applicable or unspecified: Secondary | ICD-10-CM

## 2015-11-27 DIAGNOSIS — O26892 Other specified pregnancy related conditions, second trimester: Secondary | ICD-10-CM | POA: Insufficient documentation

## 2015-11-27 DIAGNOSIS — Z3A22 22 weeks gestation of pregnancy: Secondary | ICD-10-CM | POA: Insufficient documentation

## 2015-11-27 DIAGNOSIS — Z8751 Personal history of pre-term labor: Secondary | ICD-10-CM

## 2015-11-27 DIAGNOSIS — O24410 Gestational diabetes mellitus in pregnancy, diet controlled: Secondary | ICD-10-CM

## 2015-11-28 ENCOUNTER — Encounter (HOSPITAL_COMMUNITY): Payer: Self-pay | Admitting: *Deleted

## 2015-11-28 DIAGNOSIS — R109 Unspecified abdominal pain: Secondary | ICD-10-CM | POA: Diagnosis present

## 2015-11-28 DIAGNOSIS — M549 Dorsalgia, unspecified: Secondary | ICD-10-CM | POA: Diagnosis not present

## 2015-11-28 DIAGNOSIS — M545 Low back pain: Secondary | ICD-10-CM | POA: Diagnosis not present

## 2015-11-28 DIAGNOSIS — Z87891 Personal history of nicotine dependence: Secondary | ICD-10-CM | POA: Diagnosis not present

## 2015-11-28 DIAGNOSIS — O9989 Other specified diseases and conditions complicating pregnancy, childbirth and the puerperium: Secondary | ICD-10-CM | POA: Diagnosis not present

## 2015-11-28 DIAGNOSIS — Z3A22 22 weeks gestation of pregnancy: Secondary | ICD-10-CM | POA: Diagnosis not present

## 2015-11-28 DIAGNOSIS — Z8632 Personal history of gestational diabetes: Secondary | ICD-10-CM | POA: Diagnosis not present

## 2015-11-28 DIAGNOSIS — O26892 Other specified pregnancy related conditions, second trimester: Secondary | ICD-10-CM | POA: Diagnosis not present

## 2015-11-28 LAB — URINE MICROSCOPIC-ADD ON

## 2015-11-28 LAB — URINALYSIS, ROUTINE W REFLEX MICROSCOPIC
BILIRUBIN URINE: NEGATIVE
GLUCOSE, UA: NEGATIVE mg/dL
Hgb urine dipstick: NEGATIVE
KETONES UR: 15 mg/dL — AB
NITRITE: NEGATIVE
PROTEIN: NEGATIVE mg/dL
Specific Gravity, Urine: 1.02 (ref 1.005–1.030)
pH: 5.5 (ref 5.0–8.0)

## 2015-11-28 NOTE — MAU Note (Signed)
Pt stated she has bee having ctx off and on all day. Feels like her baby has "flipped" and was worried that it is too early for  him to do that. Denies vag bleeding or discahrge . reports some back pain as well.

## 2015-11-28 NOTE — MAU Provider Note (Signed)
MAU HISTORY AND PHYSICAL  Chief Complaint:  Contractions Margaret Cain is a 30 y.o. [phone] with IUP at 80w1dwith a history of smoking, diet controlled GDM, PICA, HSIL history of pre-term delivery and pregnancy complicated by cerebral ventricuolmegaly presenting for Contractions  Patient presented with concern that "her baby had flipped" which she wasn't used to and because she was having crampy pain at her umbilicus/suprapubic area and in her back. She has had this pain intermittently throughout pregnancy - back and concern for contractions which is different than prior pregnancies. The pain today was constant, started this morning and worsened by 7pm which is when she called the nursing line here at the MAU. Pain did not come and go. She tried repositioning and 5021mof tylenol without improvement. Here she reports the pain has improved, it is no longer present in her abdomen but is still present mildly in her back. She is concerned because she is high risk. She thinks she was taking in fluids okay, denies constipation.   Denies fever, sore throat, cough, CP, SOB, vaginal itching, discharge or bleeding.     Past Medical History  Diagnosis Date  . Anemia   . Headache(784.0)   . Urinary tract infection   . Pregnancy induced hypertension   . Hx of chlamydia infection   . Depression     ppd with first child  . Vaginal Pap smear, abnormal     HSIL 06/16/15- Needs colpo    Past Surgical History  Procedure Laterality Date  . No past surgeries    . Colposcopy      Family History  Problem Relation Age of Onset  . Anesthesia problems Neg Hx   . Hypotension Neg Hx   . Malignant hyperthermia Neg Hx   . Pseudochol deficiency Neg Hx   . Other Neg Hx   . Cancer Neg Hx   . Heart disease Neg Hx   . Stroke Neg Hx   . Hearing loss Neg Hx   . Asthma Son   . Asthma Paternal Aunt     Social History  Substance Use Topics  . Smoking status: Former Smoker -- 0.25 packs/day  . Smokeless  tobacco: Never Used  . Alcohol Use: No    No Known Allergies  Facility-administered medications prior to admission  Medication Dose Route Frequency Provider Last Rate Last Dose  . hydroxyprogesterone caproate (DELALUTIN) 250 mg/mL injection 250 mg  250 mg Intramuscular Weekly Peggy Constant, MD   250 mg at 11/23/15 [id number] Prescriptions prior to admission  Medication Sig Dispense Refill Last Dose  . ACCU-CHEK FASTCLIX LANCETS MISC 1 Units by Does not apply route 4 (four) times daily. (Patient not taking: Reported on 11/23/2015) 2 each 6 Not Taking  . acetaminophen (TYLENOL) 500 MG tablet Take 1 tablet (500 mg total) by mouth every 6 (six) hours as needed. 20 tablet 0 Taking  . Blood Glucose Monitoring Suppl (ACCU-CHEK NANO SMARTVIEW) w/Device KIT 1 Units by Does not apply route QID. (Patient not taking: Reported on 11/23/2015) 1 kit 0 Not Taking  . glucose blood (ACCU-CHEK SMARTVIEW) test strip Use as instructed (Patient not taking: Reported on 11/23/2015) 100 each 12 Not Taking  . hydrocortisone 1 % ointment Apply 1 application topically 2 (two) times daily. 30 g 0   . Prenatal Vit-Fe Fumarate-FA (PRENATAL VITAMINS PLUS PO) Take 1 tablet by mouth daily.   Taking    Review of Systems - Negative except for what is mentioned in HPI.  Physical Exam  Blood pressure 103/53, pulse 80, temperature 99 F (37.2 C), resp. rate 18, last menstrual period 06/26/2015, unknown if currently breastfeeding. GENERAL: Well-developed, well-nourished female in no acute distress.  LUNGS: Clear to auscultation bilaterally.  HEART: Regular rate and rhythm. ABDOMEN: Soft, nontender, nondistended, gravid. Mild suprapubic tenderness, no rebound or guarding BACK: NTTP over spinous processes, mild TTP over bilateral lower paraspinal muscles, no CVA tenderness EXTREMITIES: Nontender, no edema, 2+ distal pulses. Cervical Exam: closed/TH/-2 (multiparous with floppy external os but closed internal os) Presentation: n/a FHT:   140s Contractions: quiet on toco   Labs: Results for orders placed or performed during the hospital encounter of 11/27/15 (from the past 24 hour(s))  Urinalysis, Routine w reflex microscopic (not at [facility])   Collection Time: 11/28/15 12:30 AM  Result Value Ref Range   Color, Urine YELLOW YELLOW   APPearance CLEAR CLEAR   Specific Gravity, Urine 1.020 1.005 - 1.030   pH 5.5 5.0 - 8.0   Glucose, UA NEGATIVE NEGATIVE mg/dL   Hgb urine dipstick NEGATIVE NEGATIVE   Bilirubin Urine NEGATIVE NEGATIVE   Ketones, ur 15 (A) NEGATIVE mg/dL   Protein, ur NEGATIVE NEGATIVE mg/dL   Nitrite NEGATIVE NEGATIVE   Leukocytes, UA SMALL (A) NEGATIVE  Urine microscopic-add on   Collection Time: 11/28/15 12:30 AM  Result Value Ref Range   Squamous Epithelial / LPF 0-5 (A) NONE SEEN   WBC, UA 6-30 0 - 5 WBC/hpf   RBC / HPF 0-5 0 - 5 RBC/hpf   Bacteria, UA RARE (A) NONE SEEN   Urine-Other MUCOUS PRESENT     Imaging Studies:  [place] Mfm Ob Detail +14 Wk  11/04/2015  OBSTETRICAL ULTRASOUND: This exam was performed within a [place] [facility] Department. The OB US report was generated in the [facility] system, and faxed to the ordering physician.  This report is available in the [employer]. See the [facility] Obstetric US report via the Image Link.   Assessment: Margaret Cain is  30 y.o. [phone] at 67w1dpresents with low back pain and abdominal cramping. Story is not consistent with true contractions though she has reported intermittent contractions throughout her pregnancy. Her primary concern was that her baby had flipped. Abdominal pain resolved here, no contractions on toco, closed cervix. Low concern for pre-term labor. FHT appreciated. Given mild suprapubic pain and +LE with a few WBCs (though no urinary symptoms) urine culture was added. Suspect component of dehydration and MSK low back pain given ketones on her urine and she reports she is just drinking caffienated tea.   Plan: 1. Back pain: - continue  tylenol prn - consider PT/stretches etc outpatient  2. Abdominal pain/concern for contractions: resolved. As noted above low suspicion for pre-term labor. Reassuring cervical exam and quiet toco. Low concern for vaginal infection given lack of symptoms.  - UA +LE, 6-20WBCs, no nitrites, 15 Ketones. Urine culture pending - advised increase hydration with water, avoid some of the caffeine she is drinking - reassurance regarding fetal positioning.  Discharge home in stable condition.    Autumn Eglitis 2/12/20171:12 AM  OB FELLOW MAU DISCHARGE ATTESTATION  I have seen and examined this patient; I agree with above documentation in the resident's note.    NDesma Maxim MD 1:52 AM

## 2015-11-29 ENCOUNTER — Encounter: Payer: Medicaid Other | Admitting: Obstetrics and Gynecology

## 2015-11-29 LAB — CULTURE, OB URINE

## 2015-11-30 ENCOUNTER — Telehealth: Payer: Self-pay

## 2015-11-30 NOTE — Telephone Encounter (Signed)
Pt called and stated that she is having complications went to MAU on Saturday night.  She does not feel that the 17p injection is working and needs advice.  Return call to the pt and pt stated that she should not be having pains like this in early pregnancy like she is.  Pt informed me  that her "baby flipped and when it flipped it cause some pain and I should not be having pain until I am about 7-8 months!".  I advised pt that it would be normal that sometimes when babies move it can cause some pain or discomfort.  I also advised pt that her body has been through 5 pregnancies and so it will be abnormal to not have a different pain.  Pt denied having contractions.   Pt stated that she is having back pain and "I have should not have back pain when I rest most of the day."  Pt upset and hung up the phone.

## 2015-12-02 ENCOUNTER — Encounter (HOSPITAL_COMMUNITY): Payer: Self-pay

## 2015-12-02 ENCOUNTER — Ambulatory Visit (HOSPITAL_COMMUNITY)
Admission: RE | Admit: 2015-12-02 | Discharge: 2015-12-02 | Disposition: A | Discharge status: Home / Self Care | Payer: Medicaid Other | Source: Ambulatory Visit | Attending: Family | Admitting: Family

## 2015-12-02 ENCOUNTER — Ambulatory Visit (INDEPENDENT_AMBULATORY_CARE_PROVIDER_SITE_OTHER): Payer: Medicaid Other | Admitting: General Practice

## 2015-12-02 VITALS — BP 105/73 | HR 98 | Wt 104.8 lb

## 2015-12-02 VITALS — BP 121/70 | HR 95 | Temp 97.8°F | Ht 60.0 in | Wt 104.0 lb

## 2015-12-02 DIAGNOSIS — O24410 Gestational diabetes mellitus in pregnancy, diet controlled: Secondary | ICD-10-CM | POA: Diagnosis not present

## 2015-12-02 DIAGNOSIS — O09292 Supervision of pregnancy with other poor reproductive or obstetric history, second trimester: Secondary | ICD-10-CM | POA: Insufficient documentation

## 2015-12-02 DIAGNOSIS — O09892 Supervision of other high risk pregnancies, second trimester: Secondary | ICD-10-CM | POA: Insufficient documentation

## 2015-12-02 DIAGNOSIS — O09212 Supervision of pregnancy with history of pre-term labor, second trimester: Secondary | ICD-10-CM

## 2015-12-02 DIAGNOSIS — O283 Abnormal ultrasonic finding on antenatal screening of mother: Secondary | ICD-10-CM

## 2015-12-02 DIAGNOSIS — Z3A22 22 weeks gestation of pregnancy: Secondary | ICD-10-CM | POA: Diagnosis not present

## 2015-12-02 DIAGNOSIS — Z36 Encounter for antenatal screening of mother: Secondary | ICD-10-CM | POA: Diagnosis not present

## 2015-12-02 LAB — POCT URINALYSIS DIP (DEVICE)
BILIRUBIN URINE: NEGATIVE
GLUCOSE, UA: NEGATIVE mg/dL
Hgb urine dipstick: NEGATIVE
Ketones, ur: 80 mg/dL — AB
NITRITE: NEGATIVE
Protein, ur: NEGATIVE mg/dL
Specific Gravity, Urine: 1.025 (ref 1.005–1.030)
UROBILINOGEN UA: 1 mg/dL (ref 0.0–1.0)
pH: 6 (ref 5.0–8.0)

## 2015-12-06 ENCOUNTER — Encounter: Payer: Medicaid Other | Admitting: Obstetrics and Gynecology

## 2015-12-08 ENCOUNTER — Ambulatory Visit: Payer: Self-pay

## 2015-12-09 ENCOUNTER — Ambulatory Visit: Payer: Self-pay

## 2015-12-09 VITALS — BP 100/64 | HR 79 | Wt 107.4 lb

## 2015-12-09 DIAGNOSIS — O09212 Supervision of pregnancy with history of pre-term labor, second trimester: Secondary | ICD-10-CM

## 2015-12-09 NOTE — Progress Notes (Signed)
Contacted [facility] for refill on pt's 17p.  Was informed that it would be sent to our office by next Thursday.

## 2015-12-13 ENCOUNTER — Encounter: Payer: Self-pay | Admitting: Obstetrics and Gynecology

## 2015-12-16 ENCOUNTER — Telehealth: Payer: Self-pay | Admitting: General Practice

## 2015-12-16 NOTE — Telephone Encounter (Signed)
Received call from compounding pharmacy that medicaid has denied refill of patient's Margaret Cain- was advised to have patient contact case worker for more information. Called patient and discussed information with her. Patient verbalized understanding & states she recently lost her medicaid but it is currently pending and she is awaiting approval. Asked patient if she knew she was getting covered again soon. Patient states she is exactly sure but will contact her case worker. Discussed with patient she may want to come in and fill out a new application for assistance with the medication from the company so she can still receive the medication while her medicaid is pending. Patient verbalized understanding & had no other questions

## 2015-12-21 ENCOUNTER — Telehealth: Payer: Self-pay | Admitting: Family Medicine

## 2015-12-22 NOTE — Telephone Encounter (Signed)
Spoke with patient and offered appt to be check in today at 3:00pm. Pt stated she has something to do at 3:00pm and will wait until her appt Monday 12/27/2015

## 2015-12-27 ENCOUNTER — Ambulatory Visit (INDEPENDENT_AMBULATORY_CARE_PROVIDER_SITE_OTHER): Payer: Self-pay | Admitting: Obstetrics and Gynecology

## 2015-12-27 ENCOUNTER — Encounter: Payer: Self-pay | Admitting: Obstetrics and Gynecology

## 2015-12-27 VITALS — BP 110/71 | HR 93 | Temp 98.5°F | Wt 111.0 lb

## 2015-12-27 DIAGNOSIS — O0992 Supervision of high risk pregnancy, unspecified, second trimester: Secondary | ICD-10-CM

## 2015-12-27 DIAGNOSIS — O24410 Gestational diabetes mellitus in pregnancy, diet controlled: Secondary | ICD-10-CM

## 2015-12-27 DIAGNOSIS — O283 Abnormal ultrasonic finding on antenatal screening of mother: Secondary | ICD-10-CM

## 2015-12-27 DIAGNOSIS — O289 Unspecified abnormal findings on antenatal screening of mother: Secondary | ICD-10-CM

## 2015-12-27 DIAGNOSIS — R87613 High grade squamous intraepithelial lesion on cytologic smear of cervix (HGSIL): Secondary | ICD-10-CM

## 2015-12-27 DIAGNOSIS — O09212 Supervision of pregnancy with history of pre-term labor, second trimester: Secondary | ICD-10-CM

## 2015-12-27 LAB — POCT URINALYSIS DIP (DEVICE)
Bilirubin Urine: NEGATIVE
GLUCOSE, UA: NEGATIVE mg/dL
Hgb urine dipstick: NEGATIVE
Ketones, ur: NEGATIVE mg/dL
Leukocytes, UA: NEGATIVE
NITRITE: NEGATIVE
PH: 6 (ref 5.0–8.0)
PROTEIN: NEGATIVE mg/dL
Specific Gravity, Urine: 1.015 (ref 1.005–1.030)
UROBILINOGEN UA: 0.2 mg/dL (ref 0.0–1.0)

## 2015-12-27 NOTE — Progress Notes (Signed)
Subjective:  Margaret Cain is a 30 y.o. [id number] at 23w2dbeing seen today for ongoing prenatal care.  She is currently monitored for the following issues for this high-risk pregnancy and has Smoker; Supervision of high-risk pregnancy; Gestational diabetes mellitus (GDM), antepartum; Pica; History of preterm delivery; Previous preterm delivery, antepartum; HSIL (high grade squamous intraepithelial lesion) on Pap smear of cervix; Pregnancy complicated by fetal cerebral ventriculomegaly; and Abnormal fetal ultrasound on her problem list.  Patient reports pelvic pressure and concerns that her cervix may be dilated.  Contractions: Irritability. Vag. Bleeding: None.  Movement: Present. Denies leaking of fluid.   The following portions of the patient's history were reviewed and updated as appropriate: allergies, current medications, past family history, past medical history, past social history, past surgical history and problem list. Problem list updated.  Objective:   Filed Vitals:   12/27/15 1049  BP: 110/71  Pulse: 93  Temp: 98.5 F (36.9 C)  Weight: 111 lb (50.349 kg)    Fetal Status: Fetal Heart Rate (bpm): 150 Fundal Height: 26 cm Movement: Present     General:  Alert, oriented and cooperative. Patient is in no acute distress.  Skin: Skin is warm and dry. No rash noted.   Cardiovascular: Normal heart rate noted  Respiratory: Normal respiratory effort, no problems with respiration noted  Abdomen: Soft, gravid, appropriate for gestational age. Pain/Pressure: Present     Pelvic: Vag. Bleeding: None Vag D/C Character: White   Cervical exam performed Dilation: Closed Effacement (%): Thick Station: Ballotable  Extremities: Normal range of motion.  Edema: Trace  Mental Status: Normal mood and affect. Normal behavior. Normal judgment and thought content.   Urinalysis: Urine Protein: Negative Urine Glucose: Negative  Assessment and Plan:  Pregnancy: [id number] 260w2d1. Supervision of  high-risk pregnancy, second trimester Patient is doing well  2. HSIL (high grade squamous intraepithelial lesion) on Pap smear of cervix   3. Diet controlled gestational diabetes mellitus (GDM), antepartum Patient has not been checking her CBGs as she has never met with the diabetic educator. She is unable to stay to meet with her today I reminded the patient of the potential complications associated with poorly controlled GDM. Patient agrees to meeting with her next week  4. Abnormal fetal ultrasound Follow up ultrasound on 3/16  5. Previous preterm delivery, antepartum, second trimester Continue weekly 17-P  Preterm labor symptoms and general obstetric precautions including but not limited to vaginal bleeding, contractions, leaking of fluid and fetal movement were reviewed in detail with the patient. Please refer to After Visit Summary for other counseling recommendations.  Return in about 2 weeks (around 01/10/2016).   PeMora BellmanMD

## 2015-12-27 NOTE — Progress Notes (Signed)
Pt complaining of cold symptoms since Friday.  Pt has decided she wants depo after delivery.

## 2015-12-30 ENCOUNTER — Ambulatory Visit (HOSPITAL_COMMUNITY): Payer: Self-pay

## 2016-01-02 ENCOUNTER — Encounter (HOSPITAL_COMMUNITY): Payer: Self-pay | Admitting: *Deleted

## 2016-01-02 ENCOUNTER — Inpatient Hospital Stay (HOSPITAL_COMMUNITY)
Admission: AD | Admit: 2016-01-02 | Discharge: 2016-01-02 | Disposition: A | Discharge status: Home / Self Care | Payer: Medicaid Other | Source: Ambulatory Visit | Attending: Obstetrics and Gynecology | Admitting: Obstetrics and Gynecology

## 2016-01-02 DIAGNOSIS — O99612 Diseases of the digestive system complicating pregnancy, second trimester: Secondary | ICD-10-CM | POA: Insufficient documentation

## 2016-01-02 DIAGNOSIS — A084 Viral intestinal infection, unspecified: Secondary | ICD-10-CM | POA: Insufficient documentation

## 2016-01-02 DIAGNOSIS — O9989 Other specified diseases and conditions complicating pregnancy, childbirth and the puerperium: Secondary | ICD-10-CM

## 2016-01-02 DIAGNOSIS — Z87891 Personal history of nicotine dependence: Secondary | ICD-10-CM | POA: Diagnosis not present

## 2016-01-02 DIAGNOSIS — Z8619 Personal history of other infectious and parasitic diseases: Secondary | ICD-10-CM | POA: Insufficient documentation

## 2016-01-02 DIAGNOSIS — R197 Diarrhea, unspecified: Secondary | ICD-10-CM | POA: Diagnosis present

## 2016-01-02 DIAGNOSIS — O26899 Other specified pregnancy related conditions, unspecified trimester: Secondary | ICD-10-CM

## 2016-01-02 DIAGNOSIS — Z3A27 27 weeks gestation of pregnancy: Secondary | ICD-10-CM | POA: Diagnosis not present

## 2016-01-02 DIAGNOSIS — Z87440 Personal history of urinary (tract) infections: Secondary | ICD-10-CM | POA: Diagnosis not present

## 2016-01-02 DIAGNOSIS — R109 Unspecified abdominal pain: Secondary | ICD-10-CM

## 2016-01-02 HISTORY — DX: Gestational diabetes mellitus in pregnancy, unspecified control: O24.419

## 2016-01-02 LAB — URINALYSIS, ROUTINE W REFLEX MICROSCOPIC
BILIRUBIN URINE: NEGATIVE
GLUCOSE, UA: NEGATIVE mg/dL
Hgb urine dipstick: NEGATIVE
KETONES UR: NEGATIVE mg/dL
Nitrite: NEGATIVE
PH: 6 (ref 5.0–8.0)
Protein, ur: NEGATIVE mg/dL
Specific Gravity, Urine: 1.02 (ref 1.005–1.030)

## 2016-01-02 LAB — URINE MICROSCOPIC-ADD ON

## 2016-01-02 MED ORDER — ONDANSETRON 8 MG PO TBDP
8.0000 mg | ORAL_TABLET | Freq: Once | ORAL | Status: AC
  Administered 2016-01-02: 8 mg via ORAL
  Filled 2016-01-02: qty 1

## 2016-01-02 MED ORDER — ONDANSETRON 8 MG PO TBDP: 8.0000 mg | ORAL_TABLET | Freq: Three times a day (TID) | ORAL | Status: DC | PRN

## 2016-01-02 MED ORDER — ACETAMINOPHEN 500 MG PO TABS
1000.0000 mg | ORAL_TABLET | Freq: Once | ORAL | Status: AC
  Administered 2016-01-02: 1000 mg via ORAL
  Filled 2016-01-02: qty 2

## 2016-01-02 MED ORDER — NIFEDIPINE 10 MG PO CAPS
10.0000 mg | ORAL_CAPSULE | ORAL | Status: DC | PRN
  Administered 2016-01-02: 10 mg via ORAL
  Filled 2016-01-02: qty 1

## 2016-01-02 NOTE — MAU Note (Signed)
Diarrhea started last night.  Has been cramping all day

## 2016-01-02 NOTE — Discharge Instructions (Signed)

## 2016-01-02 NOTE — MAU Provider Note (Signed)
History   [id number] @ 27.1 wks in with c/o contractions since this morning and diarrhea since last night.  CSN: [id number]  Arrival date & time 01/02/16  1647   None     No chief complaint on file.   HPI  Past Medical History  Diagnosis Date  . Anemia   . Headache(784.0)   . Urinary tract infection   . Pregnancy induced hypertension   . Hx of chlamydia infection   . Depression     ppd with first child  . Vaginal Pap smear, abnormal     HSIL 06/16/15- Needs colpo    Past Surgical History  Procedure Laterality Date  . No past surgeries    . Colposcopy      Family History  Problem Relation Age of Onset  . Anesthesia problems Neg Hx   . Hypotension Neg Hx   . Malignant hyperthermia Neg Hx   . Pseudochol deficiency Neg Hx   . Other Neg Hx   . Cancer Neg Hx   . Heart disease Neg Hx   . Stroke Neg Hx   . Hearing loss Neg Hx   . Asthma Son   . Asthma Paternal Aunt     Social History  Substance Use Topics  . Smoking status: Former Smoker -- 0.25 packs/day  . Smokeless tobacco: Never Used  . Alcohol Use: No    OB History    Gravida Para Term Preterm AB TAB SAB Ectopic Multiple Living   7 5 4 1 1  0 1 0 0 5      Review of Systems  Constitutional: Negative.   HENT: Negative.   Eyes: Negative.   Respiratory: Negative.   Cardiovascular: Negative.   Gastrointestinal: Positive for abdominal pain and diarrhea.  Endocrine: Negative.   Genitourinary: Negative.   Musculoskeletal: Negative.   Skin: Negative.   Allergic/Immunologic: Negative.   Neurological: Negative.   Hematological: Negative.   Psychiatric/Behavioral: Negative.     Allergies  Review of patient's allergies indicates no known allergies.  Home Medications  No current outpatient prescriptions on file.  LMP 06/26/2015 (Exact Date)  Physical Exam  Constitutional: She is oriented to person, place, and time. She appears well-developed and well-nourished.  HENT:  Head: Normocephalic.  Eyes:  Pupils are equal, round, and reactive to light.  Neck: Normal range of motion.  Cardiovascular: Normal rate, regular rhythm, normal heart sounds and intact distal pulses.   Pulmonary/Chest: Effort normal and breath sounds normal.  Abdominal: Soft. Bowel sounds are normal.  Genitourinary: Vagina normal and uterus normal.  Musculoskeletal: Normal range of motion.  Neurological: She is alert and oriented to person, place, and time. She has normal reflexes.  Skin: Skin is warm and dry.  Psychiatric: She has a normal mood and affect. Her behavior is normal. Judgment and thought content normal.    MAU Course  Procedures (including critical care time)  Labs Reviewed - No data to display No results found.   No diagnosis found.    MDM  SVE ft/th/post/ballots. Irritability noted. Dx: viral gastritis. Plan: zofran ODT and procardia to stop irritability. D/c home

## 2016-01-03 ENCOUNTER — Encounter (HOSPITAL_COMMUNITY): Payer: Self-pay | Admitting: *Deleted

## 2016-01-03 ENCOUNTER — Ambulatory Visit: Payer: Self-pay

## 2016-01-03 ENCOUNTER — Inpatient Hospital Stay (HOSPITAL_COMMUNITY)
Admission: AD | Admit: 2016-01-03 | Discharge: 2016-01-03 | Disposition: A | Discharge status: Home / Self Care | Payer: Medicaid Other | Source: Ambulatory Visit | Attending: Family Medicine | Admitting: Family Medicine

## 2016-01-03 DIAGNOSIS — R112 Nausea with vomiting, unspecified: Secondary | ICD-10-CM | POA: Insufficient documentation

## 2016-01-03 DIAGNOSIS — O26892 Other specified pregnancy related conditions, second trimester: Secondary | ICD-10-CM | POA: Insufficient documentation

## 2016-01-03 DIAGNOSIS — Z3A27 27 weeks gestation of pregnancy: Secondary | ICD-10-CM | POA: Insufficient documentation

## 2016-01-03 DIAGNOSIS — O99612 Diseases of the digestive system complicating pregnancy, second trimester: Secondary | ICD-10-CM | POA: Diagnosis not present

## 2016-01-03 DIAGNOSIS — R1013 Epigastric pain: Secondary | ICD-10-CM | POA: Diagnosis not present

## 2016-01-03 DIAGNOSIS — R197 Diarrhea, unspecified: Secondary | ICD-10-CM | POA: Insufficient documentation

## 2016-01-03 LAB — LIPASE, BLOOD: LIPASE: 19 U/L (ref 11–51)

## 2016-01-03 LAB — CBC WITH DIFFERENTIAL/PLATELET
BASOS PCT: 0 %
Basophils Absolute: 0 10*3/uL (ref 0.0–0.1)
Eosinophils Absolute: 0.1 10*3/uL (ref 0.0–0.7)
Eosinophils Relative: 1 %
HEMATOCRIT: 31.2 % — AB (ref 36.0–46.0)
Hemoglobin: 10.6 g/dL — ABNORMAL LOW (ref 12.0–15.0)
LYMPHS PCT: 12 %
Lymphs Abs: 1.6 10*3/uL (ref 0.7–4.0)
MCH: 29.4 pg (ref 26.0–34.0)
MCHC: 34 g/dL (ref 30.0–36.0)
MCV: 86.7 fL (ref 78.0–100.0)
MONO ABS: 0.7 10*3/uL (ref 0.1–1.0)
MONOS PCT: 5 %
NEUTROS ABS: 11.3 10*3/uL — AB (ref 1.7–7.7)
Neutrophils Relative %: 82 %
Platelets: 197 10*3/uL (ref 150–400)
RBC: 3.6 MIL/uL — ABNORMAL LOW (ref 3.87–5.11)
RDW: 12.7 % (ref 11.5–15.5)
WBC: 13.7 10*3/uL — ABNORMAL HIGH (ref 4.0–10.5)

## 2016-01-03 LAB — COMPREHENSIVE METABOLIC PANEL
ALT: 6 U/L — ABNORMAL LOW (ref 14–54)
ANION GAP: 5 (ref 5–15)
AST: 16 U/L (ref 15–41)
Albumin: 3 g/dL — ABNORMAL LOW (ref 3.5–5.0)
Alkaline Phosphatase: 71 U/L (ref 38–126)
BILIRUBIN TOTAL: 0.6 mg/dL (ref 0.3–1.2)
BUN: <5 mg/dL — ABNORMAL LOW (ref 6–20)
CO2: 27 mmol/L (ref 22–32)
Calcium: 8.7 mg/dL — ABNORMAL LOW (ref 8.9–10.3)
Chloride: 104 mmol/L (ref 101–111)
Creatinine, Ser: 0.56 mg/dL (ref 0.44–1.00)
GFR calc Af Amer: >60 mL/min (ref >60)
Glucose, Bld: 75 mg/dL (ref 65–99)
POTASSIUM: 3.6 mmol/L (ref 3.5–5.1)
Sodium: 136 mmol/L (ref 135–145)
TOTAL PROTEIN: 7 g/dL (ref 6.5–8.1)

## 2016-01-03 LAB — URINE MICROSCOPIC-ADD ON: RBC / HPF: NONE SEEN RBC/hpf (ref 0–5)

## 2016-01-03 LAB — URINALYSIS, ROUTINE W REFLEX MICROSCOPIC
Bilirubin Urine: NEGATIVE
GLUCOSE, UA: NEGATIVE mg/dL
Hgb urine dipstick: NEGATIVE
KETONES UR: NEGATIVE mg/dL
NITRITE: NEGATIVE
PROTEIN: NEGATIVE mg/dL
Specific Gravity, Urine: <1.005 — ABNORMAL LOW (ref 1.005–1.030)
pH: 6.5 (ref 5.0–8.0)

## 2016-01-03 LAB — GLUCOSE, CAPILLARY: GLUCOSE-CAPILLARY: 68 mg/dL (ref 65–99)

## 2016-01-03 MED ORDER — SUCRALFATE 1 G PO TABS: 1.0000 g | ORAL_TABLET | Freq: Three times a day (TID) | ORAL | Status: DC

## 2016-01-03 MED ORDER — GI COCKTAIL ~~LOC~~
30.0000 mL | Freq: Once | ORAL | Status: AC
  Administered 2016-01-03: 30 mL via ORAL
  Filled 2016-01-03: qty 30

## 2016-01-03 MED ORDER — LACTATED RINGERS IV SOLN
INTRAVENOUS | Status: DC
  Administered 2016-01-03: 12:00:00 via INTRAVENOUS

## 2016-01-03 MED ORDER — PROMETHAZINE HCL 12.5 MG PO TABS: 12.5000 mg | ORAL_TABLET | Freq: Four times a day (QID) | ORAL | Status: DC | PRN

## 2016-01-03 MED ORDER — PROMETHAZINE HCL 25 MG/ML IJ SOLN
25.0000 mg | Freq: Once | INTRAVENOUS | Status: AC
  Administered 2016-01-03: 25 mg via INTRAVENOUS
  Filled 2016-01-03: qty 1

## 2016-01-03 MED ORDER — PROMETHAZINE HCL 25 MG PO TABS: 25.0000 mg | ORAL_TABLET | Freq: Once | ORAL | Status: DC

## 2016-01-03 MED ORDER — RANITIDINE HCL 150 MG PO TABS: 150.0000 mg | ORAL_TABLET | Freq: Two times a day (BID) | ORAL | Status: DC

## 2016-01-03 NOTE — MAU Provider Note (Signed)
History     CSN: [id number]  Arrival date and time: 01/03/16 5643   First Provider Initiated Contact with Patient 01/03/16 [phone]     Chief Complaint  Patient presents with  . Diarrhea   HPI Margaret Cain is a 30 yo [id number] at 70w2dwho presents with complaints of diarrhea and contractions since yesterday.  Patient also developed some nausea and vomiting x 1 today. Denies any hematemesis or hematochezia. Was seen in MAU yesterday and was given Zofran, Zofran did not help the symptoms. The Tylenol has not helped the abdominal pain. She describes the abdominal pain as "sharp" and "left sided" but points to the center of her abdomen as the location. Pain worse with ambulation. No alleviating factors. No sick contacts. Patient states she wants to be checked to see if she has dilated since yesterday. Admits to contractions but cannot state how far apart they are. No LOF, vaginal bleeding, or vaginal discharge. States she is seen at the high risk clinic for pre-term labor and gestational diabetes. No complications besides gestational diabetes this pregnancy per patient but upon chart review, she has also been receiving 17-P for hx of pre-term delivery.   OB History    Gravida Para Term Preterm AB TAB SAB Ectopic Multiple Living   _0 0 1 0 0 5      Past Medical History  Diagnosis Date  . Anemia   . Headache(784.0)   . Urinary tract infection   . Pregnancy induced hypertension   . Hx of chlamydia infection   . Depression     ppd with first child  . 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 Gestational diabetes   . Anxiety     Past Surgical History  Procedure Laterality Date  . No past surgeries    . Colposcopy      Family History  Problem Relation Age of Onset  . Anesthesia problems Neg Hx   . Hypotension Neg Hx   . Malignant hyperthermia Neg Hx   . Pseudochol deficiency Neg Hx   . Other Neg Hx   . Cancer Neg Hx   . Heart disease Neg Hx   . Stroke  Neg Hx   . Hearing loss Neg Hx   . Asthma Son   . Asthma Paternal Aunt     Social History  Substance Use Topics  . Smoking status: Former Smoker -- 0.25 packs/day  . Smokeless tobacco: Never Used     Comment: quit  prior to preg  . Alcohol Use: No    Allergies: No Known Allergies  Facility-administered medications prior to admission  Medication Dose Route Frequency Provider Last Rate Last Dose  . hydroxyprogesterone caproate (DELALUTIN) 250 mg/mL injection 250 mg  250 mg Intramuscular Weekly Peggy Constant, MD   250 mg at 12/09/15 1108   Prescriptions prior to admission  Medication Sig Dispense Refill Last Dose  . Prenatal Vit-Fe Fumarate-FA (PRENATAL VITAMINS PLUS PO) Take 1 tablet by mouth daily. Reported on 12/02/2015   01/02/2016 at Unknown time  . ACCU-CHEK FASTCLIX LANCETS MISC 1 Units by Does not apply route 4 (four) times daily. (Patient not taking: Reported on 11/23/2015) 2 each 6 Not Taking at Unknown time  . acetaminophen (TYLENOL) 500 MG tablet Take 1 tablet (500 mg total) by mouth every 6 (six) hours as needed. (Patient not taking: Reported on 01/03/2016) 20 tablet 0 prn  . Blood Glucose Monitoring Suppl (ACCU-CHEK NANO  SMARTVIEW) w/Device KIT 1 Units by Does not apply route QID. (Patient not taking: Reported on 11/23/2015) 1 kit 0 Not Taking at Unknown time  . glucose blood (ACCU-CHEK SMARTVIEW) test strip Use as instructed (Patient not taking: Reported on 11/23/2015) 100 each 12 Not Taking at Unknown time  . hydrocortisone 1 % ointment Apply 1 application topically 2 (two) times daily. (Patient not taking: Reported on 01/02/2016) 30 g 0 Not Taking at Unknown time  . ondansetron (ZOFRAN ODT) 8 MG disintegrating tablet Take 1 tablet (8 mg total) by mouth every 8 (eight) hours as needed for nausea or vomiting. (Patient not taking: Reported on 01/03/2016) 20 tablet 0     Review of Systems  Constitutional: Negative for fever and chills.  HENT: Negative for sore throat.   Eyes:  Negative for blurred vision.  Respiratory: Negative for cough, shortness of breath and wheezing.   Cardiovascular: Negative for chest pain, palpitations and leg swelling.  Gastrointestinal: Positive for nausea, vomiting, abdominal pain and diarrhea. Negative for constipation, blood in stool and melena.  Genitourinary: Negative for dysuria.  Musculoskeletal: Negative for myalgias and joint pain.  Skin: Negative for rash.  Neurological: Positive for dizziness, weakness and headaches.   Physical Exam   Blood pressure 109/61, pulse 85, temperature 98.1 F (36.7 C), temperature source Oral, resp. rate 18, height 5' (1.524 m), weight 49.896 kg (110 lb), last menstrual period 06/26/2015, unknown if currently breastfeeding.  Physical Exam  Constitutional: She is oriented to person, place, and time. She appears well-developed and well-nourished. No distress.  HENT:  Head: Normocephalic and atraumatic.  Eyes: Pupils are equal, round, and reactive to light.  Neck: Normal range of motion.  Cardiovascular: Normal rate, regular rhythm, normal heart sounds and intact distal pulses.   Respiratory: Effort normal and breath sounds normal. No respiratory distress.  GI: Bowel sounds are normal. She exhibits distension (gravid abdomen). There is tenderness (tenderness to palpation of epigastric region).  Neurological: She is alert and oriented to person, place, and time.  Skin: Skin is warm and dry. No rash noted. She is not diaphoretic.    MAU Course  Procedures  MDM Patient is afebrile with stable vital signs, no signs of severe dehydration. Patient able to tolerate PO but had emesis x 1 in MAU. Physical exam reveals tacky mucous membranes with some epigastric/uterine fundal tenderness that is not worse with palpation, no signs of acute abdomen (unlikley appendicitis, pancreatitis, or mesenteric ischemia). Additionally CMP within normal limits and CBC only remarkable for slight anemia (hgb 10.6) and  slight leukocytosis (WBC13.7). Other differentials include gastric ulcer as patient did admit to some prior GERD symptoms but unlikely as pain not associated with eating, and no hematemesis/melena. Nausea, vomiting, and diarrhea likely gastroenteritis. Patient's symptoms improved with phenergan, IVF, and GI cocktail. FHT reactive and no contractions seen on the monitor.   Assessment and Plan  Lala Been is a 31 yo [phone] at 32w2dwho presents with complaints of diarrhea, contractions since yesterday now with nausea and emesis x 2 in last 24 hours.   Nausea/Vomiting/Diarrhea: Likely viral gastroenteritis.  - Phenergan PRN - Ranitidine and sucralfate daily - Tylenol PRN - PO hydration  - Return precautions given  CCarlyle Dolly3/20/2017, 8:55 AM   OB FELLOW MAU DISCHARGE ATTESTATION  I have seen and examined this patient; I agree with above documentation in the resident's note. 1 day diarrhea, mild nv, and epigastric/left-sided abdominal pain. Agree likely gastroenteritis. Here afebrile, neutrophilic with mildly elevated WBCs,  CMP/lipase wnl. GI cocktail relieved some pain, and does describe some gerd symtpoms, so think probably contributory, but would not explain diarrhea. No red flag symptoms, tolerating PO, successful fluid challenge. NST reactive for age. Do not think appy given absence of right-sided abdominal pain and absence of fever. Do not think dka as glucose wnl and no gap. No signs hepatitis or cholecystitis on labs. ua not suggestive of infection. Not in labor. Home with abdominal pain return precautions   Desma Maxim, MD 3:08 PM

## 2016-01-03 NOTE — MAU Note (Signed)
Arrived via EMS stating she has had diarrhea all night and this AM and has sharp abd pain. Points to LUQ. Vomited a small amount on arrival. Was seen in MAu yesterday with contractions, vomiting and minimal diarrhea.

## 2016-01-03 NOTE — Discharge Instructions (Signed)
Come to the MAU (maternity admission unit) for  You are unable to keep fluids down.  You do not urinate at least once every 6 to 8 hours.  You develop shortness of breath.  You notice blood in your stool or vomit. This may look like coffee grounds.  You have abdominal pain that increases or is concentrated in one small area (localized).  You have persistent vomiting or diarrhea.  You have a fever.  Viral Gastroenteritis Viral gastroenteritis is also known as stomach flu. This condition affects the stomach and intestinal tract. It can cause sudden diarrhea and vomiting. The illness typically lasts 3 to 8 days. Most people develop an immune response that eventually gets rid of the virus. While this natural response develops, the virus can make you quite ill. CAUSES  Many different viruses can cause gastroenteritis, such as rotavirus or noroviruses. You can catch one of these viruses by consuming contaminated food or water. You may also catch a virus by sharing utensils or other personal items with an infected person or by touching a contaminated surface. SYMPTOMS  The most common symptoms are diarrhea and vomiting. These problems can cause a severe loss of body fluids (dehydration) and a body salt (electrolyte) imbalance. Other symptoms may include:  Fever.  Headache.  Fatigue.  Abdominal pain. DIAGNOSIS  Your caregiver can usually diagnose viral gastroenteritis based on your symptoms and a physical exam. A stool sample may also be taken to test for the presence of viruses or other infections. TREATMENT  This illness typically goes away on its own. Treatments are aimed at rehydration. The most serious cases of viral gastroenteritis involve vomiting so severely that you are not able to keep fluids down. In these cases, fluids must be given through an intravenous line (IV). HOME CARE INSTRUCTIONS   Drink enough fluids to keep your urine clear or pale yellow. Drink small amounts of  fluids frequently and increase the amounts as tolerated.  Ask your caregiver for specific rehydration instructions.  Avoid:  Foods high in sugar.  Alcohol.  Carbonated drinks.  Tobacco.  Juice.  Caffeine drinks.  Extremely hot or cold fluids.  Fatty, greasy foods.  Too much intake of anything at one time.  Dairy products until 24 to 48 hours after diarrhea stops.  You may consume probiotics. Probiotics are active cultures of beneficial bacteria. They may lessen the amount and number of diarrheal stools in adults. Probiotics can be found in yogurt with active cultures and in supplements.  Wash your hands well to avoid spreading the virus.  Only take tylenol or prescription medicines for pain, discomfort, or fever as directed by your caregiver. Antidiarrheal medicines are not recommended.  Ask your caregiver if you should continue to take your regular prescribed and over-the-counter medicines.  Keep all follow-up appointments as directed by your caregiver. SEEK IMMEDIATE MEDICAL CARE IF:   You are unable to keep fluids down.  You do not urinate at least once every 6 to 8 hours.  You develop shortness of breath.  You notice blood in your stool or vomit. This may look like coffee grounds.  You have abdominal pain that increases or is concentrated in one small area (localized).  You have persistent vomiting or diarrhea.  You have a fever. MAKE SURE YOU:   Understand these instructions.  Will watch your condition.  Will get help right away if you are not doing well or get worse.   This information is not intended to replace advice given  to you by your health care provider. Make sure you discuss any questions you have with your health care provider.   Document Released: 10/02/2005 Document Revised: 12/25/2011 Document Reviewed: 07/19/2011 Elsevier Interactive Patient Education [employer]2016 [employer]

## 2016-01-03 NOTE — MAU Note (Signed)
No adverse effects to med. Patient states she still doesn't feel well, but has had no recent vomiting or diarrhea. States still has stomach pain and cramping. Dr. Jonathon JordanGambino aware.

## 2016-01-05 ENCOUNTER — Telehealth: Payer: Self-pay | Admitting: Family

## 2016-01-05 NOTE — Telephone Encounter (Signed)
Patient called to say she should have been admitted into the hospital because she has the flu. She states she is feeling bad, and wants to talk to a nurse.

## 2016-01-06 NOTE — Telephone Encounter (Signed)
I attempted to reach patient but there was no answer or voicemail to leave a message. 

## 2016-01-10 ENCOUNTER — Ambulatory Visit (HOSPITAL_COMMUNITY): Payer: Medicaid Other

## 2016-01-10 ENCOUNTER — Encounter: Payer: Self-pay | Admitting: Obstetrics & Gynecology

## 2016-01-10 NOTE — Telephone Encounter (Signed)
Pt was seen this am in the office.

## 2016-01-17 ENCOUNTER — Encounter: Payer: Medicaid Other | Admitting: Obstetrics & Gynecology

## 2016-01-27 ENCOUNTER — Encounter: Payer: Medicaid Other | Admitting: Family Medicine

## 2016-01-29 ENCOUNTER — Other Ambulatory Visit: Payer: Self-pay | Admitting: Family Medicine

## 2016-02-07 ENCOUNTER — Telehealth: Payer: Self-pay

## 2016-02-07 NOTE — Telephone Encounter (Signed)
Pt called and stated that she is having body aches and do not know if she is in labor.  Called pt and she informed me that she does not think that she is in labor she does not know why she stated that its that she having pain in her bottom, lower back.  I verified with pt that she does not have an temp.  Pt stated that she knows she does not have a temp.  Looking at pt's appt she has not shown up for several appts and missed her 17p injections.  Pt stated that she is having issues with finding a babysitter to watch her kids but she stated that she will come to her appt scheduled on Monday 02/14/16.  I strongly encouraged pt to come to her appt so that she can be evaluated.  I also encouraged pt to do pelvic exercises and to take her Tylenol for pain.  Pt stated understanding with no further questions.

## 2016-02-10 ENCOUNTER — Encounter: Payer: Medicaid Other | Admitting: Family Medicine

## 2016-02-14 ENCOUNTER — Telehealth: Payer: Self-pay

## 2016-02-14 ENCOUNTER — Ambulatory Visit (INDEPENDENT_AMBULATORY_CARE_PROVIDER_SITE_OTHER): Payer: Medicaid Other | Admitting: Advanced Practice Midwife

## 2016-02-14 VITALS — BP 123/69 | HR 91 | Wt 118.6 lb

## 2016-02-14 DIAGNOSIS — O09893 Supervision of other high risk pregnancies, third trimester: Secondary | ICD-10-CM

## 2016-02-14 DIAGNOSIS — Z91199 Patient's noncompliance with other medical treatment and regimen due to unspecified reason: Secondary | ICD-10-CM

## 2016-02-14 DIAGNOSIS — O0993 Supervision of high risk pregnancy, unspecified, third trimester: Secondary | ICD-10-CM

## 2016-02-14 DIAGNOSIS — Z1389 Encounter for screening for other disorder: Secondary | ICD-10-CM

## 2016-02-14 DIAGNOSIS — Z23 Encounter for immunization: Secondary | ICD-10-CM

## 2016-02-14 DIAGNOSIS — O4703 False labor before 37 completed weeks of gestation, third trimester: Secondary | ICD-10-CM

## 2016-02-14 DIAGNOSIS — O24410 Gestational diabetes mellitus in pregnancy, diet controlled: Secondary | ICD-10-CM | POA: Diagnosis not present

## 2016-02-14 DIAGNOSIS — O99333 Smoking (tobacco) complicating pregnancy, third trimester: Secondary | ICD-10-CM | POA: Diagnosis not present

## 2016-02-14 DIAGNOSIS — Z9119 Patient's noncompliance with other medical treatment and regimen: Secondary | ICD-10-CM

## 2016-02-14 LAB — POCT URINALYSIS DIP (DEVICE)
Bilirubin Urine: NEGATIVE
GLUCOSE, UA: NEGATIVE mg/dL
Hgb urine dipstick: NEGATIVE
Ketones, ur: NEGATIVE mg/dL
LEUKOCYTES UA: NEGATIVE
Nitrite: NEGATIVE
PROTEIN: NEGATIVE mg/dL
UROBILINOGEN UA: 1 mg/dL (ref 0.0–1.0)
pH: 6.5 (ref 5.0–8.0)

## 2016-02-14 LAB — CBC
HCT: 25.2 % — ABNORMAL LOW (ref 35.0–45.0)
Hemoglobin: 8.9 g/dL — ABNORMAL LOW (ref 11.7–15.5)
MCH: 31.8 pg (ref 27.0–33.0)
MCHC: 35.3 g/dL (ref 32.0–36.0)
MCV: 90 fL (ref 80.0–100.0)
MPV: 11.5 fL (ref 7.5–12.5)
PLATELETS: 169 10*3/uL (ref 140–400)
RBC: 2.8 MIL/uL — ABNORMAL LOW (ref 3.80–5.10)
RDW: 13.6 % (ref 11.0–15.0)
WBC: 7.5 10*3/uL (ref 3.8–10.8)

## 2016-02-14 LAB — HIV ANTIBODY (ROUTINE TESTING W REFLEX): HIV: NONREACTIVE

## 2016-02-14 LAB — FETAL FIBRONECTIN: FETAL FIBRONECTIN: NEGATIVE

## 2016-02-14 LAB — GLUCOSE, CAPILLARY: GLUCOSE-CAPILLARY: 71 mg/dL (ref 65–99)

## 2016-02-14 MED ORDER — TETANUS-DIPHTH-ACELL PERTUSSIS 5-2.5-18.5 LF-MCG/0.5 IM SUSP
0.5000 mL | Freq: Once | INTRAMUSCULAR | Status: AC
  Administered 2016-02-14: 0.5 mL via INTRAMUSCULAR

## 2016-02-14 NOTE — Telephone Encounter (Signed)
Lab called to let us know the FFN was negative.

## 2016-02-14 NOTE — Progress Notes (Signed)
U/S follow up scheduled 05/19 @ 1100am due to patient availability. Patient unable to be seen any earlier due to childcare.

## 2016-02-14 NOTE — Progress Notes (Signed)
Pt c/o pain and pressure, contractions off and on through weekend. Does not wish to continue 17-p injections Does not wish to follow blood sugars, has not been checking

## 2016-02-14 NOTE — Patient Instructions (Signed)
Gestational Diabetes Mellitus Gestational diabetes mellitus, often simply referred to as gestational diabetes, is a type of diabetes that some women develop during pregnancy. In gestational diabetes, the pancreas does not make enough insulin (a hormone), the cells are less responsive to the insulin that is made (insulin resistance), or both.Normally, insulin moves sugars from food into the tissue cells. The tissue cells use the sugars for energy. The lack of insulin or the lack of normal response to insulin causes excess sugars to build up in the blood instead of going into the tissue cells. As a result, high blood sugar (hyperglycemia) develops. The effect of high sugar (glucose) levels can cause many problems.  RISK FACTORS You have an increased chance of developing gestational diabetes if you have a family history of diabetes and also have one or more of the following risk factors:  A body mass index over 30 (obesity).  A previous pregnancy with gestational diabetes.  An older age at the time of pregnancy. If blood glucose levels are kept in the normal range during pregnancy, women can have a healthy pregnancy. If your blood glucose levels are not well controlled, there may be risks to you, your unborn baby (fetus), your labor and delivery, or your newborn baby.  SYMPTOMS  If symptoms are experienced, they are much like symptoms you would normally expect during pregnancy. The symptoms of gestational diabetes include:   Increased thirst (polydipsia).  Increased urination (polyuria).  Increased urination during the night (nocturia).  Weight loss. This weight loss may be rapid.  Frequent, recurring infections.  Tiredness (fatigue).  Weakness.  Vision changes, such as blurred vision.  Fruity smell to your breath.  Abdominal pain. DIAGNOSIS Diabetes is diagnosed when blood glucose levels are increased. Your blood glucose level may be checked by one or more of the following blood  tests:  A fasting blood glucose test. You will not be allowed to eat for at least 8 hours before a blood sample is taken.  A random blood glucose test. Your blood glucose is checked at any time of the day regardless of when you ate.  An oral glucose tolerance test (OGTT). Your blood glucose is measured after you have not eaten (fasted) for 1-3 hours and then after you drink a glucose-containing beverage. Since the hormones that cause insulin resistance are highest at about 24-28 weeks of a pregnancy, an OGTT is usually performed during that time. If you have risk factors, you may be screened for undiagnosed type 2 diabetes at your first prenatal visit. TREATMENT  Gestational diabetes should be managed first with diet and exercise. Medicines may be added only if they are needed.  You will need to take diabetes medicine or insulin daily to keep blood glucose levels in the desired range.  You will need to match insulin dosing with exercise and healthy food choices. If you have gestational diabetes, your treatment goal is to maintain the following blood glucose levels:  Before meals (preprandial): at or below 95 mg/dL.  After meals (postprandial):  One hour after a meal: at or below 140 mg/dL.  Two hours after a meal: at or below 120 mg/dL. If you have pre-existing type 1 or type 2 diabetes, your treatment goal is to maintain the following blood glucose levels:  Before meals, at bedtime, and overnight: 60-99 mg/dL.  After meals: peak of 100-129 mg/dL. HOME CARE INSTRUCTIONS   Have your hemoglobin A1c level checked twice a year.  Perform daily blood glucose monitoring as directed by  your health care provider. It is common to perform frequent blood glucose monitoring.  Monitor urine ketones when you are ill and as directed by your health care provider.  Take your diabetes medicine and insulin as directed by your health care provider to maintain your blood glucose level in the desired  range.  Never run out of diabetes medicine or insulin. It is needed every day.  Adjust insulin based on your intake of carbohydrates. Carbohydrates can raise blood glucose levels but need to be included in your diet. Carbohydrates provide vitamins, minerals, and fiber which are an essential part of a healthy diet. Carbohydrates are found in fruits, vegetables, whole grains, dairy products, legumes, and foods containing added sugars.  Eat healthy foods. Alternate 3 meals with 3 snacks.  Maintain a healthy weight gain. The usual total expected weight gain varies according to your prepregnancy body mass index (BMI).  Carry a medical alert card or wear your medical alert jewelry.  Carry a 15-gram carbohydrate snack with you at all times to treat low blood glucose (hypoglycemia). Some examples of 15-gram carbohydrate snacks include:  Glucose tablets, 3 or 4.  Glucose gel, 15-gram tube.  Raisins, 2 tablespoons (24 g).  Jelly beans, 6.  Animal crackers, 8.  Fruit juice, regular soda, or low-fat milk, 4 ounces (120 mL).  Gummy treats, 9.  Recognize hypoglycemia. Hypoglycemia during pregnancy occurs with blood glucose levels of 60 mg/dL and below. The risk for hypoglycemia increases when fasting or skipping meals, during or after intense exercise, and during sleep. Hypoglycemia symptoms can include:  Tremors or shakes.  Decreased ability to concentrate.  Sweating.  Increased heart rate.  Headache.  Dry mouth.  Hunger.  Irritability.  Anxiety.  Restless sleep.  Altered speech or coordination.  Confusion.  Treat hypoglycemia promptly. If you are alert and able to safely swallow, follow the 15:15 rule:  Take 15-20 grams of rapid-acting glucose or carbohydrate. Rapid-acting options include glucose gel, glucose tablets, or 4 ounces (120 mL) of fruit juice, regular soda, or low-fat milk.  Check your blood glucose level 15 minutes after taking the glucose.  Take 15-20  grams more of glucose if the repeat blood glucose level is still 70 mg/dL or below.  Eat a meal or snack within 1 hour once blood glucose levels return to normal.  Be alert to polyuria (excess urination) and polydipsia (excess thirst) which are early signs of hyperglycemia. An early awareness of hyperglycemia allows for prompt treatment. Treat hyperglycemia as directed by your health care provider.  Engage in at least 30 minutes of physical activity a day or as directed by your health care provider. Ten minutes of physical activity timed 30 minutes after each meal is encouraged to control postprandial blood glucose levels.  Adjust your insulin dosing and food intake as needed if you start a new exercise or sport.  Follow your sick-day plan at any time you are unable to eat or drink as usual.  Avoid tobacco and alcohol use.  Keep all follow-up visits as directed by your health care provider.  Follow the advice of your health care provider regarding your prenatal and post-delivery (postpartum) appointments, meal planning, exercise, medicines, vitamins, blood tests, other medical tests, and physical activities.  Perform daily skin and foot care. Examine your skin and feet daily for cuts, bruises, redness, nail problems, bleeding, blisters, or sores.  Brush your teeth and gums at least twice a day and floss at least once a day. Follow up with your dentist   regularly.  Schedule an eye exam during the first trimester of your pregnancy or as directed by your health care provider.  Share your diabetes management plan with your workplace or school.  Stay up-to-date with immunizations.  Learn to manage stress.  Obtain ongoing diabetes education and support as needed.  Learn about and consider breastfeeding your baby.  You should have your blood sugar level checked 6-12 weeks after delivery. This is done with an oral glucose tolerance test (OGTT). SEEK MEDICAL CARE IF:   You are unable to  eat food or drink fluids for more than 6 hours.  You have nausea and vomiting for more than 6 hours.  You have a blood glucose level of 200 mg/dL and you have ketones in your urine.  There is a change in mental status.  You develop vision problems.  You have a persistent headache.  You have upper abdominal pain or discomfort.  You develop an additional serious illness.  You have diarrhea for more than 6 hours.  You have been sick or have had a fever for a couple of days and are not getting better. SEEK IMMEDIATE MEDICAL CARE IF:   You have difficulty breathing.  You no longer feel the baby moving.  You are bleeding or have discharge from your vagina.  You start having premature contractions or labor. MAKE SURE YOU:  Understand these instructions.  Will watch your condition.  Will get help right away if you are not doing well or get worse.   This information is not intended to replace advice given to you by your health care provider. Make sure you discuss any questions you have with your health care provider.   Document Released: 01/08/2001 Document Revised: 10/23/2014 Document Reviewed: 04/30/2012 Elsevier Interactive Patient Education 2016 [employer].  Preterm Birth Preterm birth is a birth that happens before 37 weeks of pregnancy. Most pregnancies last about 39-41 weeks. Every week in the womb is important and is beneficial to the health of the infant. Infants born before 37 weeks of pregnancy are at a higher risk for complications. Depending on when the infant was born, he or she may be:  Late preterm. Born between 32 weeks and 37 weeks of pregnancy.  Very preterm. Born at less than 32 weeks of pregnancy.  Extremely preterm. Born at less than 25 weeks of pregnancy. The earlier a baby is born, the more likely the child will have issues related to prematurity. Complications and problems that can be seen in infants born too early include:  Problems breathing  (respiratory distress syndrome).  Low birth weight.  Problems feeding.  Sleeping problems.  Yellowing of the skin (jaundice).  Infections such as pneumonia. Babies born very preterm or extremely preterm are at risk for more serious medical issues. These include:  More severe breathing issues.  Eyesight issues.  Brain development issues (intraventricular hemorrhage).  Behavioral and emotional development issues.  Growth and developmental delays.  Cerebral palsy.  Serious feeding or bowel complications (necrotizing enterocolitis). CAUSES  There are two broad categories of preterm birth.  Spontaneous preterm birth. This is a birth resulting from preterm labor (not medically induced) or preterm premature rupture of membranes (PPROM).  Indicated preterm birth. This is a birth resulting from labor being medically induced due to health, personal, or social reasons. RISK FACTORS Preterm birth may be related to certain medical conditions, lifestyle factors, or demographic factors encountered by the mother or fetus.  Medical conditions include:  Multiple gestations (twins, triplets, and  so on).  Infection.  Diabetes.  Heart disease.  Kidney disease.  Cervical or uterine abnormalities.  Being underweight.  High blood pressure or preeclampsia.  Premature rupture of membranes (PROM).  Birth defects in the fetus.  Lifestyle factors include:  Poor prenatal care.  Poor nutrition or anemia.  Cigarette smoking.  Consuming alcohol.  High levels of stress and lack of social or emotional support.  Exposure to chemical or environmental toxins.  Substance abuse.  Demographic factors include:  African-American ethnicity.  Age (younger than 8918 or older than 29 years of age).  Low socioeconomic status. Women with a history of preterm labor or who become pregnant within 2718 months of giving birth are also at increased risk for preterm birth. DIAGNOSIS  Your  health care provider may request additional tests to diagnose underlying complications resulting from preterm birth. Tests on the infant may include:  Physical exam.  Blood tests.  Chest X-rays.  Heart-lung monitoring. TREATMENT  After birth, special care will be taken to assess any problems or complications for the infant. Supportive care will be provided for the infant. Treatment depends on what problems are present and any complications that develop. Some preterm infants are cared for in a neonatal intensive care unit. In general, care may include:  Maintaining temperature and oxygen in a clear heated box (baby isolette).  Monitoring the infant's heart rate, breathing, and level of oxygen in the blood.  Monitoring for signs of infection and, if needed, giving IV antibiotic medicine.  Inserting a feeding tube (nose, mouth) or giving IV nutrition if unable to feed.  Inserting a breathing tube (ventilation).  Respiration support (continuous positive airway pressure [CPAP] or oxygen). Treatment will change as the infant builds up strength and is able to breathe and eat on his or her own. For some infants, no special treatment is necessary. Parents may be educated on the potential health risks of prematurity to the infant. HOME CARE INSTRUCTIONS  Understand your infant's special conditions and needs. It may be reassuring to learn about infant CPR.  Monitor your infant in the car seat until he or she grows and matures. Infant car seats can cause breathing difficulties for preterm infants.  Keep your infant warm. Dress your infant in layers and keep him or her away from drafts, especially in cold months of the year.  Wash your hands thoroughly after going to the bathroom or changing a diaper. Late preterm infants may be more prone to infection.  Follow all your health care provider's instructions for providing support and care to your preterm infant.  Get support from organizations  and groups that understand your challenges.  Follow up with your infant's health care provider as directed. Prevention There are some things you can do to help lower your risk of having a preterm infant in the future. These include:  Good prenatal care throughout the entire pregnancy. See a health care provider regularly for advice and tests.  Management of underlying medical conditions.  Proper self-care and lifestyle changes.  Proper diet and weight control.  Watching for signs of various infections. SEEK MEDICAL CARE IF:  Your infant has feeding difficulties.  Your infant has sleeping difficulties.  Your infant has breathing difficulties.  Your infant's skin starts to look yellow.  Your infant shows signs of infection, such as a stuffy nose, fever, crying, or bluish color of the skin. FOR MORE INFORMATION [employer]: www.marchofdimes.com Prematurity.org: www.prematurity.org   This information is not intended to replace advice  given to you by your health care provider. Make sure you discuss any questions you have with your health care provider.   Document Released: 12/23/2003 Document Revised: 07/23/2013 Document Reviewed: 05/01/2013 Elsevier Interactive Patient Education [employer]

## 2016-02-15 LAB — RPR

## 2016-02-16 DIAGNOSIS — O09899 Supervision of other high risk pregnancies, unspecified trimester: Secondary | ICD-10-CM | POA: Insufficient documentation

## 2016-02-16 DIAGNOSIS — Z9119 Patient's noncompliance with other medical treatment and regimen: Secondary | ICD-10-CM

## 2016-02-16 NOTE — Progress Notes (Signed)
Subjective:  Margaret Cain is a 30 y.o. [phone] at 5524w2d being seen today for ongoing prenatal care.  She is currently monitored for the following issues for this high-risk pregnancy and has Smoker; Supervision of high-risk pregnancy; Gestational diabetes mellitus (GDM), antepartum; Pica; History of preterm delivery; Previous preterm delivery, antepartum; HSIL (high grade squamous intraepithelial lesion) on Pap smear of cervix; Pregnancy complicated by fetal cerebral ventriculomegaly; and Abnormal fetal ultrasound on her problem list.  Patient reports occasional contractions.  Contractions: Irritability. Vag. Bleeding: None.  Movement: Present. Denies leaking of fluid.   The following portions of the patient's history were reviewed and updated as appropriate: allergies, current medications, past family history, past medical history, past social history, past surgical history and problem list. Problem list updated.  Refuses to check CBGs and is demanding IOL at 35 weeks due to discomforts of pregnancy. Is adamant that 35 week is term.  Objective:   Filed Vitals:   02/14/16 0847  BP: 123/69  Pulse: 91  Weight: 118 lb 9.6 oz (53.797 kg)    Fetal Status: Fetal Heart Rate (bpm): 130   Movement: Present     General:  Alert, oriented and cooperative. Patient is in no acute distress.  Skin: Skin is warm and dry. No rash noted.   Cardiovascular: Normal heart rate noted  Respiratory: Normal respiratory effort, no problems with respiration noted  Abdomen: Soft, gravid, appropriate for gestational age. Pain/Pressure: Present     Pelvic: Vag. Bleeding: None Vag D/C Character: Mucous   Cervical exam performed Dilation: Fingertip Effacement (%): Thick Station: Ballotable  Extremities: Normal range of motion.  Edema: None  Mental Status: Normal mood and affect. Normal behavior. Normal judgment and thought content.   Urinalysis: Urine Protein: Negative Urine Glucose: Negative  Assessment and Plan:   Pregnancy: [id number] at 7428w4d  1. Supervision of high-risk pregnancy, third trimester  - US MFM OB FOLLOW UP; Future - HIV antibody (with reflex) - CBC - RPR - Tdap (BOOSTRIX) injection 0.5 mL; Inject 0.5 mLs into the muscle once.  2. Encounter for routine screening for malformation using ultrasonics  - US MFM OB FOLLOW UP; Future  3. Diet controlled gestational diabetes mellitus in third trimester  - US MFM OB FOLLOW UP; Future  4. Tobacco smoking affecting pregnancy, antepartum, third trimester  - US MFM OB FOLLOW UP; Future  5. Preterm uterine contractions in third trimester, antepartum  - Fetal fibronectin  Preterm labor symptoms and general obstetric precautions including but not limited to vaginal bleeding, contractions, leaking of fluid and fetal movement were reviewed in detail with the patient. Please refer to After Visit Summary for other counseling recommendations.  Lengthy discussion about importance of testing blood sugars, bringing log book and keeping appointments. Discussed risks of uncontrolled DM in pregnancy including delayed fetal lung maturity, IUFD and shoulder dystocia possibly resulting brachial plexus palsy, brain damage, intrapartum death and extensive obstetric lacerations. Patient still refuses.  Discussed increased risks associated w/ late preterm delivery. Pt refuses to believe that 35 weeks is preterm and has increased risks compared to term delivery.  Return in about 2 weeks (around 02/28/2016).   Dorathy KinsmanVirginia Aissa Lisowski, CNM

## 2016-02-17 ENCOUNTER — Encounter: Payer: Self-pay | Admitting: Advanced Practice Midwife

## 2016-02-17 DIAGNOSIS — O99019 Anemia complicating pregnancy, unspecified trimester: Secondary | ICD-10-CM | POA: Insufficient documentation

## 2016-02-18 ENCOUNTER — Ambulatory Visit (INDEPENDENT_AMBULATORY_CARE_PROVIDER_SITE_OTHER): Payer: Medicaid Other | Admitting: Family Medicine

## 2016-02-18 ENCOUNTER — Ambulatory Visit (HOSPITAL_COMMUNITY): Payer: Medicaid Other

## 2016-02-18 ENCOUNTER — Telehealth: Payer: Self-pay

## 2016-02-18 VITALS — BP 116/70 | HR 80 | Wt 120.7 lb

## 2016-02-18 DIAGNOSIS — O99013 Anemia complicating pregnancy, third trimester: Secondary | ICD-10-CM

## 2016-02-18 DIAGNOSIS — O0993 Supervision of high risk pregnancy, unspecified, third trimester: Secondary | ICD-10-CM

## 2016-02-18 DIAGNOSIS — O09213 Supervision of pregnancy with history of pre-term labor, third trimester: Secondary | ICD-10-CM

## 2016-02-18 DIAGNOSIS — O24410 Gestational diabetes mellitus in pregnancy, diet controlled: Secondary | ICD-10-CM

## 2016-02-18 DIAGNOSIS — O09212 Supervision of pregnancy with history of pre-term labor, second trimester: Secondary | ICD-10-CM

## 2016-02-18 LAB — HEMOGLOBIN A1C
Hgb A1c MFr Bld: 5.2 % (ref <5.7)
Mean Plasma Glucose: 103 mg/dL

## 2016-02-18 MED ORDER — BETAMETHASONE SOD PHOS & ACET 6 (3-3) MG/ML IJ SUSP
12.0000 mg | Freq: Once | INTRAMUSCULAR | Status: AC
  Administered 2016-02-18: 12 mg via INTRAMUSCULAR

## 2016-02-18 NOTE — Telephone Encounter (Signed)
Please notify pt of Hgb 8.9. Needs to start OCT FeSO4 325 mg OP BID and increase dietary iron.   This patient was seen today. She is an inmate at the [facility]

## 2016-02-18 NOTE — Addendum Note (Signed)
Addended by: Cheree DittoGRAHAM, Shane Badeaux A on: 02/18/2016 11:44 AM   Modules accepted: Orders

## 2016-02-18 NOTE — Progress Notes (Signed)
Subjective:  Margaret Cain is a 30 y.o. [phone] at 8071w6d being seen today for ongoing prenatal care.  She is currently monitored for the following issues for this high-risk pregnancy and has Smoker; Supervision of high-risk pregnancy; Gestational diabetes mellitus (GDM), antepartum; Pica; History of preterm delivery; Previous preterm delivery, antepartum; HSIL (high grade squamous intraepithelial lesion) on Pap smear of cervix; Pregnancy complicated by fetal cerebral ventriculomegaly; Abnormal fetal ultrasound; Noncompliant pregnant patient, antepartum; and Anemia affecting pregnancy, antepartum on her problem list.  GDM: Diet controlled. Only able to take CBG daily.   Patient reports intermittent contractions..  Contractions: Irritability.  .  Movement: Present. Denies leaking of fluid.   The following portions of the patient's history were reviewed and updated as appropriate: allergies, current medications, past family history, past medical history, past social history, past surgical history and problem list. Problem list updated.  Objective:   Filed Vitals:   02/18/16 1042  BP: 116/70  Pulse: 80  Weight: 120 lb 11.2 oz (54.749 kg)    Fetal Status: Fetal Heart Rate (bpm): 136   Movement: Present     General:  Alert, oriented and cooperative. Patient is in no acute distress.  Skin: Skin is warm and dry. No rash noted.   Cardiovascular: Normal heart rate noted  Respiratory: Normal respiratory effort, no problems with respiration noted  Abdomen: Soft, gravid, appropriate for gestational age. Pain/Pressure: Present     Pelvic:      No pooling.  Fern neg. Cervical exam performed Dilation: 2 Effacement (%): 50 Station: -2  Extremities: Normal range of motion.     Mental Status: Normal mood and affect. Normal behavior. Normal judgment and thought content.   Urinalysis:      Assessment and Plan:  Pregnancy: [id number] at 2671w6d  1. Supervision of high-risk pregnancy, third trimester FHT,  FH normal.  Has repeat US next week  2. Diet controlled gestational diabetes mellitus (GDM), antepartum Hemoglobin A1c today.  Checks CBG once a day.  3. Previous preterm delivery, antepartum, second trimester BMZ today as 2cm.  Pt to return in 24hrs to [facility] for second dose.  Preterm labor symptoms and general obstetric precautions including but not limited to vaginal bleeding, contractions, leaking of fluid and fetal movement were reviewed in detail with the patient. Please refer to After Visit Summary for other counseling recommendations.  No Follow-up on file.   Levie HeritageJacob J Stinson, DO

## 2016-02-19 ENCOUNTER — Inpatient Hospital Stay (HOSPITAL_COMMUNITY)
Admission: AD | Admit: 2016-02-19 | Discharge: 2016-02-19 | Disposition: A | Discharge status: Home / Self Care | Payer: Medicaid Other | Source: Ambulatory Visit | Attending: Obstetrics & Gynecology | Admitting: Obstetrics & Gynecology

## 2016-02-19 ENCOUNTER — Encounter (HOSPITAL_COMMUNITY): Payer: Self-pay | Admitting: Certified Nurse Midwife

## 2016-02-19 DIAGNOSIS — O4703 False labor before 37 completed weeks of gestation, third trimester: Secondary | ICD-10-CM

## 2016-02-19 DIAGNOSIS — Z3A34 34 weeks gestation of pregnancy: Secondary | ICD-10-CM | POA: Diagnosis not present

## 2016-02-19 DIAGNOSIS — Z87891 Personal history of nicotine dependence: Secondary | ICD-10-CM | POA: Diagnosis not present

## 2016-02-19 LAB — URINALYSIS, ROUTINE W REFLEX MICROSCOPIC
Bilirubin Urine: NEGATIVE
Glucose, UA: NEGATIVE mg/dL
Hgb urine dipstick: NEGATIVE
KETONES UR: 40 mg/dL — AB
LEUKOCYTES UA: NEGATIVE
NITRITE: NEGATIVE
PH: 6 (ref 5.0–8.0)
PROTEIN: NEGATIVE mg/dL
Specific Gravity, Urine: 1.025 (ref 1.005–1.030)

## 2016-02-19 MED ORDER — BETAMETHASONE SOD PHOS & ACET 6 (3-3) MG/ML IJ SUSP
12.0000 mg | Freq: Once | INTRAMUSCULAR | Status: AC
  Administered 2016-02-19: 12 mg via INTRAMUSCULAR
  Filled 2016-02-19: qty 2

## 2016-02-19 MED ORDER — NIFEDIPINE 10 MG PO CAPS
10.0000 mg | ORAL_CAPSULE | Freq: Once | ORAL | Status: AC
  Administered 2016-02-19: 10 mg via ORAL
  Filled 2016-02-19: qty 1

## 2016-02-19 NOTE — Discharge Instructions (Signed)
Preterm Labor Information Preterm labor is when labor starts at less than 37 weeks of pregnancy. The normal length of a pregnancy is 39 to 41 weeks. CAUSES Often, there is no identifiable underlying cause as to why a woman goes into preterm labor. One of the most common known causes of preterm labor is infection. Infections of the uterus, cervix, vagina, amniotic sac, bladder, kidney, or even the lungs (pneumonia) can cause labor to start. Other suspected causes of preterm labor include:   Urogenital infections, such as yeast infections and bacterial vaginosis.   Uterine abnormalities (uterine shape, uterine septum, fibroids, or bleeding from the placenta).   A cervix that has been operated on (it may fail to stay closed).   Malformations in the fetus.   Multiple gestations (twins, triplets, and so on).   Breakage of the amniotic sac.  RISK FACTORS  Having a previous history of preterm labor.   Having premature rupture of membranes (PROM).   Having a placenta that covers the opening of the cervix (placenta previa).   Having a placenta that separates from the uterus (placental abruption).   Having a cervix that is too weak to hold the fetus in the uterus (incompetent cervix).   Having too much fluid in the amniotic sac (polyhydramnios).   Taking illegal drugs or smoking while pregnant.   Not gaining enough weight while pregnant.   Being younger than 6 and older than 30 years old.   Having a low socioeconomic status.   Being African American. SYMPTOMS Signs and symptoms of preterm labor include:   Menstrual-like cramps, abdominal pain, or back pain.  Uterine contractions that are regular, as frequent as six in an hour, regardless of their intensity (may be mild or painful).  Contractions that start on the top of the uterus and spread down to the lower abdomen and back.   A sense of increased pelvic pressure.   A watery or bloody mucus discharge  that comes from the vagina.  TREATMENT Depending on the length of the pregnancy and other circumstances, your health care provider may suggest bed rest. If necessary, there are medicines that can be given to stop contractions and to mature the fetal lungs. If labor happens before 34 weeks of pregnancy, a prolonged hospital stay may be recommended. Treatment depends on the condition of both you and the fetus.  WHAT SHOULD YOU DO IF YOU THINK YOU ARE IN PRETERM LABOR? Call your health care provider right away. You will need to go to the hospital to get checked immediately. HOW CAN YOU PREVENT PRETERM LABOR IN FUTURE PREGNANCIES? You should:   Stop smoking if you smoke.  Maintain healthy weight gain and avoid chemicals and drugs that are not necessary.  Be watchful for any type of infection.  Inform your health care provider if you have a known history of preterm labor.   This information is not intended to replace advice given to you by your health care provider. Make sure you discuss any questions you have with your health care provider.   Document Released: 12/23/2003 Document Revised: 06/04/2013 Document Reviewed: 11/04/2012 Elsevier Interactive Patient Education 2016 Elsevier Inc.  Fetal Fibronectin  --  Yours was NEGATIVE Fetal fibronectin (fFN) is a protein that your body produces during pregnancy. This protein is normally found in your vaginal fluid in early pregnancy and just before delivery. It should not be there between 22 and 35 weeks of pregnancy. Having fFN in your vagina between 22 and 35 weeks could  be a warning sign that your baby will be born early (prematurely). Babies born prematurely, or before 37 weeks, may have trouble breathing or feeding. A negative fFN test between 22 and 35 weeks means that it is unlikely you will have a premature delivery in the next 2 weeks. You may have this test if you have symptoms of premature labor. These  include:  Contractions.  Increased vaginal discharge.  Backache. If there is a chance of preterm labor and delivery, your health care provider will monitor you carefully and take steps to delay your labor if necessary.  This test requires a sample of fluid from inside your vagina. Your health care provider collects this sample using a cotton swab.  PREPARATION FOR TEST   Ask your health care provider if:  You need to avoid using lubricants or douches before this exam.  You need to avoid sexual intercourse for 24 hours before the exam.  Tell your health care provider if you have a vaginal yeast infection or any symptoms of a yeast infection:  Itching.  Soreness.  Discharge. RESULTS It is your responsibility to obtain your test results. Ask the lab or department performing the test when and how you will get your results. Contact your health care provider to discuss any questions you have about your results.  The results of this test will be positive or negative.  Meaning of Negative Test Results A negative result means no fFN was found in your vaginal fluid. A negative result means that there is very little chance you will go into labor in the next two weeks. You may have this test again in two weeks if you are still having symptoms of early labor. Meaning of Positive Test Results A positive result means fFN was found in your vaginal fluid. A positive result does not mean you will go into early labor. It does mean your risk is greater. Your health care provider may do other tests and exams to closely follow your pregnancy.   This information is not intended to replace advice given to you by your health care provider. Make sure you discuss any questions you have with your health care provider.   Document Released: 08/03/2004 Document Revised: 10/23/2014 Document Reviewed: 12/30/2013 Elsevier Interactive Patient Education [employer]2016 Elsevier Inc.

## 2016-02-19 NOTE — MAU Note (Signed)
Pt here for 2nd betamethasone injection. Pt states she is having painful ctxs and vaginal discharge. Pt states +FM. Pt denies vaginal bleeding. Pt is an inmate.

## 2016-02-19 NOTE — MAU Provider Note (Signed)
Chief Complaint:  Contractions   Seen by provider at 1320hrs   HPI  Margaret Cain is a 30 y.o. [id number] at 74w0dho presents to maternity admissions reporting she is scheduled for a second dose of betamethasone today. Was given the first dose when she was found to be 2cm in office. States now she feels some contractions and pressure.  She reports good fetal movement, denies LOF, vaginal bleeding, vaginal itching/burning, urinary symptoms, h/a, dizziness, n/v, diarrhea, constipation or fever/chills.  She denies headache, visual changes or RUQ abdominal pain.     Currently incercerated until next Tuesday.  RN Note: Pt here for 2nd betamethasone injection. Pt states she is having painful ctxs and vaginal discharge. Pt states +FM. Pt denies vaginal bleeding. Pt is an inmate   Past Medical History: Past Medical History  Diagnosis Date  . Anemia   . Headache(784.0)   . Urinary tract infection   . Pregnancy induced hypertension   . Hx of chlamydia infection   . Depression     ppd with first child  . 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 Gestational diabetes   . Anxiety     Past obstetric history: OB History  Gravida Para Term Preterm AB SAB TAB Ectopic Multiple Living  '7 5 4 1 1 1 ' 0 0 0 5    # Outcome Date GA Lbr Len/2nd Weight Sex Delivery Anes PTL Lv  7 Current           6 Term 09/08/14 387w4d3:40 / 00:07 6 lb 15.3 oz (3.155 kg) F Vag-Spont None  Y  5 Term 09/20/13 3944w5d:10 / 00:31 7 lb (3.175 kg) F Vag-Spont EPI  Y  4 Preterm 07/17/12 35w79w3d05 / 00:12 4 lb 5.7 oz (1.975 kg) M Vag-Spont EPI  Y  3 SAB 10/05/11          2 Term 10/15/10 [id number] 2 oz (3.232 kg) M Vag-Spont   Y  1 Term 2005 [id number] 7 oz (2.466 kg) M Vag-Spont   Y      Past Surgical History: Past Surgical History  Procedure Laterality Date  . No past surgeries    . Colposcopy      Family History: Family History  Problem Relation Age of Onset  . Anesthesia problems  Neg Hx   . Hypotension Neg Hx   . Malignant hyperthermia Neg Hx   . Pseudochol deficiency Neg Hx   . Other Neg Hx   . Cancer Neg Hx   . Heart disease Neg Hx   . Stroke Neg Hx   . Hearing loss Neg Hx   . Asthma Son   . Asthma Paternal Aunt     Social History: Social History  Substance Use Topics  . Smoking status: Former Smoker -- 0.25 packs/day  . Smokeless tobacco: Never Used     Comment: quit  prior to preg  . Alcohol Use: No    Allergies: No Known Allergies  Meds:  Facility-administered medications prior to admission  Medication Dose Route Frequency Provider Last Rate Last Dose  . hydroxyprogesterone caproate (DELALUTIN) 250 mg/mL injection 250 mg  250 mg Intramuscular Weekly Peggy Constant, MD   250 mg at 12/09/15 1108   Prescriptions prior to admission  Medication Sig Dispense Refill Last Dose  . ACCU-CHEK FASTCLIX LANCETS MISC 1 Units by Does not apply route 4 (four) times daily. 2 each 6 02/18/2016 at Unknown  time  . acetaminophen (TYLENOL) 500 MG tablet Take 1 tablet (500 mg total) by mouth every 6 (six) hours as needed. 20 tablet 0 02/19/2016 at Unknown time  . Blood Glucose Monitoring Suppl (ACCU-CHEK NANO SMARTVIEW) w/Device KIT 1 Units by Does not apply route QID. 1 kit 0 02/18/2016 at Unknown time  . glucose blood (ACCU-CHEK SMARTVIEW) test strip Use as instructed 100 each 12 02/18/2016 at Unknown time  . Prenatal Vit-Fe Fumarate-FA (PRENATAL VITAMINS PLUS PO) Take 1 tablet by mouth daily. Reported on 12/02/2015   02/18/2016 at Unknown time  . hydrocortisone 1 % ointment Apply 1 application topically 2 (two) times daily. (Patient not taking: Reported on 02/19/2016) 30 g 0 Taking  . promethazine (PHENERGAN) 12.5 MG tablet Take 1 tablet (12.5 mg total) by mouth every 6 (six) hours as needed for nausea or vomiting. (Patient not taking: Reported on 02/19/2016) 30 tablet 0 Taking  . ranitidine (ZANTAC) 150 MG tablet Take 1 tablet (150 mg total) by mouth 2 (two) times daily. (Patient  not taking: Reported on 02/19/2016) 60 tablet 1 Taking  . sucralfate (CARAFATE) 1 g tablet Take 1 tablet (1 g total) by mouth 4 (four) times daily -  with meals and at bedtime. (Patient not taking: Reported on 02/19/2016) 30 tablet 0 Taking    I have reviewed patient's Past Medical Hx, Surgical Hx, Family Hx, Social Hx, medications and allergies.   ROS:  Review of Systems  Constitutional: Negative for fever, chills and fatigue.  Respiratory: Negative for shortness of breath.   Gastrointestinal: Negative for nausea, vomiting, diarrhea and constipation.  Genitourinary: Positive for pelvic pain. Negative for dysuria, vaginal bleeding, vaginal discharge and vaginal pain.  Musculoskeletal: Negative for back pain.   Other systems negative  Physical Exam  Patient Vitals for the past 24 hrs:  BP Temp Temp src Pulse Resp Height Weight  02/19/16 1344 101/67 mmHg - - 98 - - -  02/19/16 1212 104/64 mmHg 98.1 F (36.7 C) Oral 95 20 5' (1.524 m) 120 lb (54.432 kg)   Constitutional: Well-developed, well-nourished female in no acute distress.  Cardiovascular: normal rate and rhythm Respiratory: normal effort, clear to auscultation bilaterally GI: Abd soft, non-tender, gravid appropriate for gestational age.   No rebound or guarding. MS: Extremities nontender, no edema, normal ROM Neurologic: Alert and oriented x 4.  GU: Neg CVAT.  PELVIC EXAM:   Dilation: 3 Effacement (%): 50 Station: -2 Exam by:: ARonnald Ramp RN  FHT:  Baseline 145 , moderate variability, accelerations present, no decelerations Contractions:  Irregular    Labs: Results for orders placed or performed during the hospital encounter of 02/19/16 (from the past 24 hour(s))  Urinalysis, Routine w reflex microscopic (not at [facility])     Status: Abnormal   Collection Time: 02/19/16 11:59 AM  Result Value Ref Range   Color, Urine YELLOW YELLOW   APPearance CLEAR CLEAR   Specific Gravity, Urine 1.025 1.005 - 1.030   pH 6.0 5.0 - 8.0    Glucose, UA NEGATIVE NEGATIVE mg/dL   Hgb urine dipstick NEGATIVE NEGATIVE   Bilirubin Urine NEGATIVE NEGATIVE   Ketones, ur 40 (A) NEGATIVE mg/dL   Protein, ur NEGATIVE NEGATIVE mg/dL   Nitrite NEGATIVE NEGATIVE   Leukocytes, UA NEGATIVE NEGATIVE   O/Positive/-- (11/23 0000)  Imaging:  No results found.  MAU Course/MDM: I have ordered labs and reviewed results.  NST reviewed Procardia given for one dose with lessening of contractions    Assessment: [facility] at [redacted]w[redacted]d  Irregular contractions with no change in cervix Early dilation of cervix Neg Fetal fibronectin  Plan: Betamethasone x 2nd dose [facility] Preterm Labor precautions and fetal kick counts Follow up in Office for prenatal visits and recheck    Medication List    ASK your doctor about these medications        ACCU-CHEK FASTCLIX LANCETS Misc  1 Units by Does not apply route 4 (four) times daily.     ACCU-CHEK NANO SMARTVIEW w/Device Kit  1 Units by Does not apply route QID.     acetaminophen 500 MG tablet  Commonly known as:  TYLENOL  Take 1 tablet (500 mg total) by mouth every 6 (six) hours as needed.     glucose blood test strip  Commonly known as:  ACCU-CHEK SMARTVIEW  Use as instructed     hydrocortisone 1 % ointment  Apply 1 application topically 2 (two) times daily.     PRENATAL VITAMINS PLUS PO  Take 1 tablet by mouth daily. Reported on 12/02/2015     promethazine 12.5 MG tablet  Commonly known as:  PHENERGAN  Take 1 tablet (12.5 mg total) by mouth every 6 (six) hours as needed for nausea or vomiting.     ranitidine 150 MG tablet  Commonly known as:  ZANTAC  Take 1 tablet (150 mg total) by mouth 2 (two) times daily.     sucralfate 1 g tablet  Commonly known as:  CARAFATE  Take 1 tablet (1 g total) by mouth 4 (four) times daily -  with meals and at bedtime.       Pt stable at time of discharge.  Hansel Feinstein CNM, MSN Certified Nurse-Midwife 02/19/2016 2:09 PM

## 2016-02-20 ENCOUNTER — Inpatient Hospital Stay (HOSPITAL_COMMUNITY)
Admission: AD | Admit: 2016-02-20 | Discharge: 2016-02-21 | Disposition: A | Discharge status: Home / Self Care | Source: Ambulatory Visit | Attending: Obstetrics & Gynecology | Admitting: Obstetrics & Gynecology

## 2016-02-20 DIAGNOSIS — O283 Abnormal ultrasonic finding on antenatal screening of mother: Secondary | ICD-10-CM

## 2016-02-20 DIAGNOSIS — Z87891 Personal history of nicotine dependence: Secondary | ICD-10-CM | POA: Insufficient documentation

## 2016-02-20 DIAGNOSIS — M549 Dorsalgia, unspecified: Secondary | ICD-10-CM | POA: Insufficient documentation

## 2016-02-20 DIAGNOSIS — O26893 Other specified pregnancy related conditions, third trimester: Secondary | ICD-10-CM | POA: Diagnosis not present

## 2016-02-20 DIAGNOSIS — O4703 False labor before 37 completed weeks of gestation, third trimester: Secondary | ICD-10-CM | POA: Diagnosis not present

## 2016-02-20 DIAGNOSIS — Z9119 Patient's noncompliance with other medical treatment and regimen: Secondary | ICD-10-CM

## 2016-02-20 DIAGNOSIS — R32 Unspecified urinary incontinence: Secondary | ICD-10-CM | POA: Insufficient documentation

## 2016-02-20 DIAGNOSIS — O09212 Supervision of pregnancy with history of pre-term labor, second trimester: Secondary | ICD-10-CM

## 2016-02-20 DIAGNOSIS — O350XX0 Maternal care for (suspected) central nervous system malformation in fetus, not applicable or unspecified: Secondary | ICD-10-CM

## 2016-02-20 DIAGNOSIS — O99019 Anemia complicating pregnancy, unspecified trimester: Secondary | ICD-10-CM

## 2016-02-20 DIAGNOSIS — Z3A34 34 weeks gestation of pregnancy: Secondary | ICD-10-CM | POA: Insufficient documentation

## 2016-02-20 DIAGNOSIS — Z8751 Personal history of pre-term labor: Secondary | ICD-10-CM

## 2016-02-20 DIAGNOSIS — R109 Unspecified abdominal pain: Secondary | ICD-10-CM | POA: Diagnosis not present

## 2016-02-20 DIAGNOSIS — O3509X0 Maternal care for (suspected) other central nervous system malformation or damage in fetus, not applicable or unspecified: Secondary | ICD-10-CM

## 2016-02-20 DIAGNOSIS — O09893 Supervision of other high risk pregnancies, third trimester: Secondary | ICD-10-CM

## 2016-02-20 DIAGNOSIS — O0993 Supervision of high risk pregnancy, unspecified, third trimester: Secondary | ICD-10-CM

## 2016-02-20 LAB — POCT FERN TEST: POCT Fern Test: NEGATIVE

## 2016-02-20 NOTE — MAU Note (Signed)
Pt c/o irregular contractions and a gush of clear fluid around 2330. Denies vaginal bleeding. +FM. Cervix was 3cm yesterday.

## 2016-02-21 ENCOUNTER — Encounter (HOSPITAL_COMMUNITY): Payer: Self-pay | Admitting: *Deleted

## 2016-02-21 DIAGNOSIS — O4703 False labor before 37 completed weeks of gestation, third trimester: Secondary | ICD-10-CM | POA: Diagnosis not present

## 2016-02-21 DIAGNOSIS — Z3A34 34 weeks gestation of pregnancy: Secondary | ICD-10-CM | POA: Diagnosis not present

## 2016-02-21 NOTE — Discharge Instructions (Signed)
Braxton Hicks Contractions °Contractions of the uterus can occur throughout pregnancy. Contractions are not always a sign that you are in labor.  °WHAT ARE BRAXTON HICKS CONTRACTIONS?  °Contractions that occur before labor are called Braxton Hicks contractions, or false labor. Toward the end of pregnancy (32-34 weeks), these contractions can develop more often and may become more forceful. This is not true labor because these contractions do not result in opening (dilatation) and thinning of the cervix. They are sometimes difficult to tell apart from true labor because these contractions can be forceful and people have different pain tolerances. You should not feel embarrassed if you go to the hospital with false labor. Sometimes, the only way to tell if you are in true labor is for your health care provider to look for changes in the cervix. °If there are no prenatal problems or other health problems associated with the pregnancy, it is completely safe to be sent home with false labor and await the onset of true labor. °HOW CAN YOU TELL THE DIFFERENCE BETWEEN TRUE AND FALSE LABOR? °False Labor °· The contractions of false labor are usually shorter and not as hard as those of true labor.   °· The contractions are usually irregular.   °· The contractions are often felt in the front of the lower abdomen and in the groin.   °· The contractions may go away when you walk around or change positions while lying down.   °· The contractions get weaker and are shorter lasting as time goes on.   °· The contractions do not usually become progressively stronger, regular, and closer together as with true labor.   °True Labor °· Contractions in true labor last 30-70 seconds, become very regular, usually become more intense, and increase in frequency.   °· The contractions do not go away with walking.   °· The discomfort is usually felt in the top of the uterus and spreads to the lower abdomen and low back.   °· True labor can be  determined by your health care provider with an exam. This will show that the cervix is dilating and getting thinner.   °WHAT TO REMEMBER °· Keep up with your usual exercises and follow other instructions given by your health care provider.   °· Take medicines as directed by your health care provider.   °· Keep your regular prenatal appointments.   °· Eat and drink lightly if you think you are going into labor.   °· If Braxton Hicks contractions are making you uncomfortable:   °¨ Change your position from lying down or resting to walking, or from walking to resting.   °¨ Sit and rest in a tub of warm water.   °¨ Drink 2-3 glasses of water. Dehydration may cause these contractions.   °¨ Do slow and deep breathing several times an hour.   °WHEN SHOULD I SEEK IMMEDIATE MEDICAL CARE? °Seek immediate medical care if: °· Your contractions become stronger, more regular, and closer together.   °· You have fluid leaking or gushing from your vagina.   °· You have a fever.   °· You pass blood-tinged mucus.   °· You have vaginal bleeding.   °· You have continuous abdominal pain.   °· You have low back pain that you never had before.   °· You feel your baby's head pushing down and causing pelvic pressure.   °· Your baby is not moving as much as it used to.   °  °This information is not intended to replace advice given to you by your health care provider. Make sure you discuss any questions you have with your health care   provider. °  °Document Released: 10/02/2005 Document Revised: 10/07/2013 Document Reviewed: 07/14/2013 °Elsevier Interactive Patient Education ©2016 Elsevier Inc. ° °

## 2016-02-21 NOTE — MAU Provider Note (Signed)
History   [id number] @ 34.2 wks in with back pain, contractions and leaking of fluid[facility] With exam pt noted to be voiding on self.  CSN: [id number]  Arrival date & time 02/20/16  2338   None     Chief Complaint  Patient presents with  . Rupture of Membranes  . Contractions    HPI  Past Medical History  Diagnosis Date  . Anemia   . Headache(784.0)   . Urinary tract infection   . Pregnancy induced hypertension   . Hx of chlamydia infection   . Depression     ppd with first child  . 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 Gestational diabetes   . Anxiety     Past Surgical History  Procedure Laterality Date  . No past surgeries    . Colposcopy      Family History  Problem Relation Age of Onset  . Anesthesia problems Neg Hx   . Hypotension Neg Hx   . Malignant hyperthermia Neg Hx   . Pseudochol deficiency Neg Hx   . Other Neg Hx   . Cancer Neg Hx   . Heart disease Neg Hx   . Stroke Neg Hx   . Hearing loss Neg Hx   . Asthma Son   . Asthma Paternal Aunt     Social History  Substance Use Topics  . Smoking status: Former Smoker -- 0.25 packs/day    Quit date: 09/23/2015  . Smokeless tobacco: Never Used     Comment: quit  prior to preg  . Alcohol Use: No    OB History    Gravida Para Term Preterm AB TAB SAB Ectopic Multiple Living   0 1 0 0 5      Review of Systems  Constitutional: Negative.   HENT: Negative.   Eyes: Negative.   Respiratory: Negative.   Cardiovascular: Negative.   Gastrointestinal: Positive for abdominal pain.  Endocrine: Negative.   Genitourinary: Negative.   Musculoskeletal: Positive for back pain.  Skin: Negative.   Allergic/Immunologic: Negative.   Neurological: Negative.   Hematological: Negative.   Psychiatric/Behavioral: Negative.     Allergies  Review of patient's allergies indicates no known allergies.  Home Medications  No current outpatient prescriptions on  file.  BP 113/76 mmHg  Pulse 98  Temp(Src) 98.1 F (36.7 C) (Oral)  Resp 20  SpO2 98%  LMP 06/26/2015 (Exact Date)  Physical Exam  Constitutional: She is oriented to person, place, and time. She appears well-developed and well-nourished.  HENT:  Head: Normocephalic.  Eyes: Pupils are equal, round, and reactive to light.  Neck: Normal range of motion.  Cardiovascular: Normal rate, regular rhythm, normal heart sounds and intact distal pulses.   Pulmonary/Chest: Effort normal and breath sounds normal.  Abdominal: Soft. Bowel sounds are normal.  Genitourinary: Vagina normal and uterus normal.  Musculoskeletal: Normal range of motion.  Neurological: She is alert and oriented to person, place, and time. She has normal reflexes.  Skin: Skin is warm and dry.  Psychiatric: She has a normal mood and affect. Her behavior is normal. Judgment and thought content normal.    MAU Course  Procedures (including critical care time)  Labs Reviewed  POCT FERN TEST   No results found.   1. Anemia affecting pregnancy, antepartum   2. Noncompliant pregnant patient, antepartum, third trimester   3. Pregnancy complicated by fetal cerebral ventriculomegaly, not applicable  or unspecified fetus   4. Abnormal fetal ultrasound   5. Previous preterm delivery, antepartum, second trimester   6. Supervision of high-risk pregnancy, third trimester   7. History of preterm delivery   8. False labor before 37 completed weeks of gestation during pregnancy in third trimester, antepartum       MDM  Incontinence at 34.2 Neg fern, SVE 2/th/post/blts D/C back to jail

## 2016-02-23 NOTE — Telephone Encounter (Signed)
Pt was seen in MAU and this was already address

## 2016-02-28 ENCOUNTER — Ambulatory Visit (INDEPENDENT_AMBULATORY_CARE_PROVIDER_SITE_OTHER): Payer: Medicaid Other | Admitting: Family Medicine

## 2016-02-28 ENCOUNTER — Encounter: Payer: Medicaid Other | Admitting: Obstetrics and Gynecology

## 2016-02-28 ENCOUNTER — Other Ambulatory Visit (HOSPITAL_COMMUNITY): Payer: Self-pay | Admitting: Family Medicine

## 2016-02-28 VITALS — BP 116/76 | HR 93 | Wt 116.1 lb

## 2016-02-28 DIAGNOSIS — Z9119 Patient's noncompliance with other medical treatment and regimen: Secondary | ICD-10-CM

## 2016-02-28 DIAGNOSIS — O99019 Anemia complicating pregnancy, unspecified trimester: Secondary | ICD-10-CM | POA: Diagnosis not present

## 2016-02-28 DIAGNOSIS — Z113 Encounter for screening for infections with a predominantly sexual mode of transmission: Secondary | ICD-10-CM | POA: Diagnosis not present

## 2016-02-28 DIAGNOSIS — F419 Anxiety disorder, unspecified: Secondary | ICD-10-CM | POA: Diagnosis not present

## 2016-02-28 DIAGNOSIS — D649 Anemia, unspecified: Secondary | ICD-10-CM | POA: Diagnosis not present

## 2016-02-28 DIAGNOSIS — O24410 Gestational diabetes mellitus in pregnancy, diet controlled: Secondary | ICD-10-CM | POA: Diagnosis not present

## 2016-02-28 DIAGNOSIS — K5901 Slow transit constipation: Secondary | ICD-10-CM

## 2016-02-28 DIAGNOSIS — O0993 Supervision of high risk pregnancy, unspecified, third trimester: Secondary | ICD-10-CM

## 2016-02-28 DIAGNOSIS — O09893 Supervision of other high risk pregnancies, third trimester: Secondary | ICD-10-CM

## 2016-02-28 DIAGNOSIS — O09213 Supervision of pregnancy with history of pre-term labor, third trimester: Secondary | ICD-10-CM

## 2016-02-28 LAB — POCT URINALYSIS DIP (DEVICE)
Bilirubin Urine: NEGATIVE
GLUCOSE, UA: NEGATIVE mg/dL
HGB URINE DIPSTICK: NEGATIVE
Nitrite: NEGATIVE
PROTEIN: 30 mg/dL — AB
SPECIFIC GRAVITY, URINE: 1.02 (ref 1.005–1.030)
UROBILINOGEN UA: 2 mg/dL — AB (ref 0.0–1.0)
pH: 7 (ref 5.0–8.0)

## 2016-02-28 LAB — OB RESULTS CONSOLE GBS: STREP GROUP B AG: NEGATIVE

## 2016-02-28 MED ORDER — ACETAMINOPHEN 500 MG PO TABS: 500.0000 mg | ORAL_TABLET | Freq: Four times a day (QID) | ORAL | Status: DC | PRN

## 2016-02-28 MED ORDER — DOCUSATE SODIUM 100 MG PO CAPS: 100.0000 mg | ORAL_CAPSULE | Freq: Two times a day (BID) | ORAL | Status: DC | PRN

## 2016-02-28 MED ORDER — PRENATAL VITAMINS PLUS 27-1 MG PO TABS: 1.0000 | ORAL_TABLET | Freq: Every day | ORAL | Status: DC

## 2016-02-28 MED ORDER — HYDROXYZINE HCL 25 MG PO TABS: 25.0000 mg | ORAL_TABLET | Freq: Four times a day (QID) | ORAL | Status: DC | PRN

## 2016-02-28 NOTE — Patient Instructions (Addendum)
Places to have your son circumcised:    [facility] [phone] $480 by 4 wks  [facility] [phone] $244 by 4 wks  [facility] [phone] $175 by 2 wks  [facility] [phone] $250 by 7 days [facility] [phone] $150 by 4 wks  These prices sometimes change but are roughly what you can expect to pay. Please call and confirm pricing.   Circumcision is considered an elective/non-medically necessary procedure. There are many reasons parents decide to have their sons circumsized. During the first year of life circumcised males have a reduced risk of urinary tract infections but after this year the rates between circumcised males and uncircumcised males are the same.  It is safe to have your son circumcised outside of the hospital and the places above perform them regularly.   Third Trimester of Pregnancy The third trimester is from week 29 through week 42, months 7 through 9. The third trimester is a time when the fetus is growing rapidly. At the end of the ninth month, the fetus is about 20 inches in length and weighs 6-10 pounds.  BODY CHANGES Your body goes through many changes during pregnancy. The changes vary from woman to woman.   Your weight will continue to increase. You can expect to gain 25-35 pounds (11-16 kg) by the end of the pregnancy.  You may begin to get stretch marks on your hips, abdomen, and breasts.  You may urinate more often because the fetus is moving lower into your pelvis and pressing on your bladder.  You may develop or continue to have heartburn as a result of your pregnancy.  You may develop constipation because certain hormones are causing the muscles that push waste through your intestines to slow down.  You may develop hemorrhoids or swollen, bulging veins (varicose veins).  You may have  pelvic pain because of the weight gain and pregnancy hormones relaxing your joints between the bones in your pelvis. Backaches may result from overexertion of the muscles supporting your posture.  You may have changes in your hair. These can include thickening of your hair, rapid growth, and changes in texture. Some women also have hair loss during or after pregnancy, or hair that feels dry or thin. Your hair will most likely return to normal after your baby is born.  Your breasts will continue to grow and be tender. A yellow discharge may leak from your breasts called colostrum.  Your belly button may stick out.  You may feel short of breath because of your expanding uterus.  You may notice the fetus "dropping," or moving lower in your abdomen.  You may have a bloody mucus discharge. This usually occurs a few days to a week before labor begins.  Your cervix becomes thin and soft (effaced) near your due date. WHAT TO EXPECT AT YOUR PRENATAL EXAMS  You will have prenatal exams every 2 weeks until week 36. Then, you will have weekly prenatal exams. During a routine prenatal visit:  You will be weighed to make sure you and the fetus are growing normally.  Your blood pressure is taken.  Your abdomen will be measured to track your baby's growth.  The fetal heartbeat will be listened to.  Any test results from the previous visit will be discussed.  You may have a cervical check near your due date to see if you have effaced. At around 36 weeks, your caregiver will check your cervix. At the same time, your caregiver will also perform a test on the secretions   of the vaginal tissue. This test is to determine if a type of bacteria, Group B streptococcus, is present. Your caregiver will explain this further. Your caregiver may ask you:  What your birth plan is.  How you are feeling.  If you are feeling the baby move.  If you have had any abnormal symptoms, such as leaking fluid, bleeding,  severe headaches, or abdominal cramping.  If you are using any tobacco products, including cigarettes, chewing tobacco, and electronic cigarettes.  If you have any questions. Other tests or screenings that may be performed during your third trimester include:  Blood tests that check for low iron levels (anemia).  Fetal testing to check the health, activity level, and growth of the fetus. Testing is done if you have certain medical conditions or if there are problems during the pregnancy.  HIV (human immunodeficiency virus) testing. If you are at high risk, you may be screened for HIV during your third trimester of pregnancy. FALSE LABOR You may feel small, irregular contractions that eventually go away. These are called Braxton Hicks contractions, or false labor. Contractions may last for hours, days, or even weeks before true labor sets in. If contractions come at regular intervals, intensify, or become painful, it is best to be seen by your caregiver.  SIGNS OF LABOR   Menstrual-like cramps.  Contractions that are 5 minutes apart or less.  Contractions that start on the top of the uterus and spread down to the lower abdomen and back.  A sense of increased pelvic pressure or back pain.  A watery or bloody mucus discharge that comes from the vagina. If you have any of these signs before the 37th week of pregnancy, call your caregiver right away. You need to go to the hospital to get checked immediately. HOME CARE INSTRUCTIONS   Avoid all smoking, herbs, alcohol, and unprescribed drugs. These chemicals affect the formation and growth of the baby.  Do not use any tobacco products, including cigarettes, chewing tobacco, and electronic cigarettes. If you need help quitting, ask your health care provider. You may receive counseling support and other resources to help you quit.  Follow your caregiver's instructions regarding medicine use. There are medicines that are either safe or unsafe  to take during pregnancy.  Exercise only as directed by your caregiver. Experiencing uterine cramps is a good sign to stop exercising.  Continue to eat regular, healthy meals.  Wear a good support bra for breast tenderness.  Do not use hot tubs, steam rooms, or saunas.  Wear your seat belt at all times when driving.  Avoid raw meat, uncooked cheese, cat litter boxes, and soil used by cats. These carry germs that can cause birth defects in the baby.  Take your prenatal vitamins.  Take 1500-2000 mg of calcium daily starting at the 20th week of pregnancy until you deliver your baby.  Try taking a stool softener (if your caregiver approves) if you develop constipation. Eat more high-fiber foods, such as fresh vegetables or fruit and whole grains. Drink plenty of fluids to keep your urine clear or pale yellow.  Take warm sitz baths to soothe any pain or discomfort caused by hemorrhoids. Use hemorrhoid cream if your caregiver approves.  If you develop varicose veins, wear support hose. Elevate your feet for 15 minutes, 3-4 times a day. Limit salt in your diet.  Avoid heavy lifting, wear low heal shoes, and practice good posture.  Rest a lot with your legs elevated if you have leg   cramps or low back pain.  Visit your dentist if you have not gone during your pregnancy. Use a soft toothbrush to brush your teeth and be gentle when you floss.  A sexual relationship may be continued unless your caregiver directs you otherwise.  Do not travel far distances unless it is absolutely necessary and only with the approval of your caregiver.  Take prenatal classes to understand, practice, and ask questions about the labor and delivery.  Make a trial run to the hospital.  Pack your hospital bag.  Prepare the baby's nursery.  Continue to go to all your prenatal visits as directed by your caregiver. SEEK MEDICAL CARE IF:  You are unsure if you are in labor or if your water has broken.  You  have dizziness.  You have mild pelvic cramps, pelvic pressure, or nagging pain in your abdominal area.  You have persistent nausea, vomiting, or diarrhea.  You have a bad smelling vaginal discharge.  You have pain with urination. SEEK IMMEDIATE MEDICAL CARE IF:   You have a fever.  You are leaking fluid from your vagina.  You have spotting or bleeding from your vagina.  You have severe abdominal cramping or pain.  You have rapid weight loss or gain.  You have shortness of breath with chest pain.  You notice sudden or extreme swelling of your face, hands, ankles, feet, or legs.  You have not felt your baby move in over an hour.  You have severe headaches that do not go away with medicine.  You have vision changes.   This information is not intended to replace advice given to you by your health care provider. Make sure you discuss any questions you have with your health care provider.   Document Released: 09/26/2001 Document Revised: 10/23/2014 Document Reviewed: 12/03/2012 Elsevier Interactive Patient Education 2016 [employer]   

## 2016-02-28 NOTE — Progress Notes (Signed)
Subjective:  Margaret Cain is a 30 y.o. [phone] at 6640w2d being seen today for ongoing prenatal care.  Margaret is currently monitored for the following issues for this high-risk pregnancy and has Smoker; Supervision of high-risk pregnancy; Gestational diabetes mellitus (GDM), Margaret Cain, Margaret Cain ultrasound; Noncompliant pregnant patient, antepartum; and Anemia affecting pregnancy, antepartum on her problem list.  Patient reports no complaints.  Contractions: Irregular. Vag. Bleeding: None.  Movement: Present. Denies leaking of fluid.   The following portions of the patient's history were reviewed and updated as appropriate: allergies, current medications, past family history, past medical history, past social history, past surgical history and problem list. Problem list updated.  Objective:   Filed Vitals:   02/28/16 1138  BP: 116/76  Pulse: 93  Weight: 116 lb 1.6 oz (52.663 kg)    Cain Status: Cain Heart Rate (bpm): 150   Movement: Present     General:  Alert, oriented and cooperative. Patient is in no acute distress.  Skin: Skin is warm and dry. No rash noted.   Cardiovascular: Normal heart rate noted  Respiratory: Normal respiratory effort, no problems with respiration noted  Abdomen: Soft, gravid, appropriate for gestational age. Pain/Pressure: Present     Pelvic: Vag. Bleeding: None Vag D/C Character: Yellow   Cervical exam deferred Dilation: 3 Effacement (%): 50 Station: -2  Extremities: Normal range of motion.  Edema: None  Mental Status: Normal mood and affect. Normal behavior. Normal judgment and thought content.   Urinalysis: Urine Protein: 1+ Urine Glucose: Negative  Assessment and Plan:  Pregnancy: [id number] at 6040w2d  1. Diet controlled gestational diabetes mellitus  (GDM), antepartum Non-compliant with diet and checking glucose levels due to incarceration  2. Anemia affecting pregnancy, antepartum  3. Noncompliant pregnant patient, antepartum, third trimester  4. Previous preterm Cain, antepartum, third trimester  5. Supervision of high-risk pregnancy, third trimester Updated box, routine pnc Collected cx  Preterm labor symptoms and general obstetric precautions including but not limited to vaginal bleeding, contractions, leaking of fluid and Cain movement were reviewed in detail with the patient. Please refer to After Visit Summary for other counseling recommendations.  Return in about 1 week (around 03/06/2016) for Routine prenatal care.   Federico FlakeKimberly Niles Camil Wilhelmsen, MD

## 2016-02-28 NOTE — Progress Notes (Signed)
Urine: 40 ketones, small amt leukocytes

## 2016-02-29 LAB — WET PREP, GENITAL
CLUE CELLS WET PREP: NONE SEEN
TRICH WET PREP: NONE SEEN
WBC, Wet Prep HPF POC: NONE SEEN
Yeast Wet Prep HPF POC: NONE SEEN

## 2016-02-29 LAB — GC/CHLAMYDIA PROBE AMP (~~LOC~~) NOT AT ARMC
Chlamydia: NEGATIVE
Neisseria Gonorrhea: NEGATIVE

## 2016-03-01 ENCOUNTER — Encounter (HOSPITAL_COMMUNITY): Payer: Self-pay

## 2016-03-01 ENCOUNTER — Inpatient Hospital Stay (HOSPITAL_COMMUNITY)
Admission: AD | Admit: 2016-03-01 | Discharge: 2016-03-02 | Disposition: A | Discharge status: Home / Self Care | Payer: Medicaid Other | Source: Ambulatory Visit | Attending: Obstetrics and Gynecology | Admitting: Obstetrics and Gynecology

## 2016-03-01 DIAGNOSIS — Z3A35 35 weeks gestation of pregnancy: Secondary | ICD-10-CM | POA: Diagnosis not present

## 2016-03-01 DIAGNOSIS — O283 Abnormal ultrasonic finding on antenatal screening of mother: Secondary | ICD-10-CM

## 2016-03-01 DIAGNOSIS — O0993 Supervision of high risk pregnancy, unspecified, third trimester: Secondary | ICD-10-CM

## 2016-03-01 DIAGNOSIS — Z3483 Encounter for supervision of other normal pregnancy, third trimester: Secondary | ICD-10-CM | POA: Insufficient documentation

## 2016-03-01 DIAGNOSIS — Z9119 Patient's noncompliance with other medical treatment and regimen: Secondary | ICD-10-CM

## 2016-03-01 DIAGNOSIS — O09213 Supervision of pregnancy with history of pre-term labor, third trimester: Secondary | ICD-10-CM

## 2016-03-01 DIAGNOSIS — Z8751 Personal history of pre-term labor: Secondary | ICD-10-CM

## 2016-03-01 DIAGNOSIS — O99019 Anemia complicating pregnancy, unspecified trimester: Secondary | ICD-10-CM

## 2016-03-01 DIAGNOSIS — O3509X0 Maternal care for (suspected) other central nervous system malformation or damage in fetus, not applicable or unspecified: Secondary | ICD-10-CM

## 2016-03-01 DIAGNOSIS — O24410 Gestational diabetes mellitus in pregnancy, diet controlled: Secondary | ICD-10-CM

## 2016-03-01 DIAGNOSIS — O09893 Supervision of other high risk pregnancies, third trimester: Secondary | ICD-10-CM

## 2016-03-01 DIAGNOSIS — O350XX0 Maternal care for (suspected) central nervous system malformation in fetus, not applicable or unspecified: Secondary | ICD-10-CM

## 2016-03-01 LAB — CULTURE, BETA STREP (GROUP B ONLY)

## 2016-03-01 NOTE — MAU Note (Signed)
Pt presents complaining of fatigue. States she was having anxiety before she called the ambulance but now she feels better. States she just wants to have her cervix checked and make sure everything is ok.

## 2016-03-02 DIAGNOSIS — Z3483 Encounter for supervision of other normal pregnancy, third trimester: Secondary | ICD-10-CM | POA: Diagnosis not present

## 2016-03-02 LAB — URINE MICROSCOPIC-ADD ON: RBC / HPF: NONE SEEN RBC/hpf (ref 0–5)

## 2016-03-02 LAB — URINALYSIS, ROUTINE W REFLEX MICROSCOPIC
Bilirubin Urine: NEGATIVE
GLUCOSE, UA: NEGATIVE mg/dL
HGB URINE DIPSTICK: NEGATIVE
KETONES UR: 15 mg/dL — AB
Nitrite: NEGATIVE
PROTEIN: NEGATIVE mg/dL
Specific Gravity, Urine: 1.025 (ref 1.005–1.030)
pH: 6 (ref 5.0–8.0)

## 2016-03-02 NOTE — Discharge Instructions (Signed)
Generalized Anxiety Disorder Generalized anxiety disorder (GAD) is a mental disorder. It interferes with life functions, including relationships, work, and school. GAD is different from normal anxiety, which everyone experiences at some point in their lives in response to specific life events and activities. Normal anxiety actually helps us prepare for and get through these life events and activities. Normal anxiety goes away after the event or activity is over.  GAD causes anxiety that is not necessarily related to specific events or activities. It also causes excess anxiety in proportion to specific events or activities. The anxiety associated with GAD is also difficult to control. GAD can vary from mild to severe. People with severe GAD can have intense waves of anxiety with physical symptoms (panic attacks).  SYMPTOMS The anxiety and worry associated with GAD are difficult to control. This anxiety and worry are related to many life events and activities and also occur more days than not for 6 months or longer. People with GAD also have three or more of the following symptoms (one or more in children):  Restlessness.   Fatigue.  Difficulty concentrating.   Irritability.  Muscle tension.  Difficulty sleeping or unsatisfying sleep. DIAGNOSIS GAD is diagnosed through an assessment by your health care provider. Your health care provider will ask you questions aboutyour mood,physical symptoms, and events in your life. Your health care provider may ask you about your medical history and use of alcohol or drugs, including prescription medicines. Your health care provider may also do a physical exam and blood tests. Certain medical conditions and the use of certain substances can cause symptoms similar to those associated with GAD. Your health care provider may refer you to a mental health specialist for further evaluation. TREATMENT The following therapies are usually used to treat GAD:    Medication. Antidepressant medication usually is prescribed for long-term daily control. Antianxiety medicines may be added in severe cases, especially when panic attacks occur.   Talk therapy (psychotherapy). Certain types of talk therapy can be helpful in treating GAD by providing support, education, and guidance. A form of talk therapy called cognitive behavioral therapy can teach you healthy ways to think about and react to daily life events and activities.  Stress managementtechniques. These include yoga, meditation, and exercise and can be very helpful when they are practiced regularly. A mental health specialist can help determine which treatment is best for you. Some people see improvement with one therapy. However, other people require a combination of therapies.   This information is not intended to replace advice given to you by your health care provider. Make sure you discuss any questions you have with your health care provider.   Document Released: 01/27/2013 Document Revised: 10/23/2014 Document Reviewed: 01/27/2013 Elsevier Interactive Patient Education [employer] Third Trimester of Pregnancy The third trimester is from week 29 through week 42, months 7 through 9. The third trimester is a time when the fetus is growing rapidly. At the end of the ninth month, the fetus is about 20 inches in length and weighs 6-10 pounds.  BODY CHANGES Your body goes through many changes during pregnancy. The changes vary from woman to woman.   Your weight will continue to increase. You can expect to gain 25-35 pounds (11-16 kg) by the end of the pregnancy.  You may begin to get stretch marks on your hips, abdomen, and breasts.  You may urinate more often because the fetus is moving lower into your pelvis and pressing on your bladder.  You may develop or continue to have heartburn as a result of your pregnancy.  You may develop constipation because certain hormones are causing the  muscles that push waste through your intestines to slow down.  You may develop hemorrhoids or swollen, bulging veins (varicose veins).  You may have pelvic pain because of the weight gain and pregnancy hormones relaxing your joints between the bones in your pelvis. Backaches may result from overexertion of the muscles supporting your posture.  You may have changes in your hair. These can include thickening of your hair, rapid growth, and changes in texture. Some women also have hair loss during or after pregnancy, or hair that feels dry or thin. Your hair will most likely return to normal after your baby is born.  Your breasts will continue to grow and be tender. A yellow discharge may leak from your breasts called colostrum.  Your belly button may stick out.  You may feel short of breath because of your expanding uterus.  You may notice the fetus "dropping," or moving lower in your abdomen.  You may have a bloody mucus discharge. This usually occurs a few days to a week before labor begins.  Your cervix becomes thin and soft (effaced) near your due date. WHAT TO EXPECT AT YOUR PRENATAL EXAMS  You will have prenatal exams every 2 weeks until week 36. Then, you will have weekly prenatal exams. During a routine prenatal visit:  You will be weighed to make sure you and the fetus are growing normally.  Your blood pressure is taken.  Your abdomen will be measured to track your baby's growth.  The fetal heartbeat will be listened to.  Any test results from the previous visit will be discussed.  You may have a cervical check near your due date to see if you have effaced. At around 36 weeks, your caregiver will check your cervix. At the same time, your caregiver will also perform a test on the secretions of the vaginal tissue. This test is to determine if a type of bacteria, Group B streptococcus, is present. Your caregiver will explain this further. Your caregiver may ask you:  What your  birth plan is.  How you are feeling.  If you are feeling the baby move.  If you have had any abnormal symptoms, such as leaking fluid, bleeding, severe headaches, or abdominal cramping.  If you are using any tobacco products, including cigarettes, chewing tobacco, and electronic cigarettes.  If you have any questions. Other tests or screenings that may be performed during your third trimester include:  Blood tests that check for low iron levels (anemia).  Fetal testing to check the health, activity level, and growth of the fetus. Testing is done if you have certain medical conditions or if there are problems during the pregnancy.  HIV (human immunodeficiency virus) testing. If you are at high risk, you may be screened for HIV during your third trimester of pregnancy. FALSE LABOR You may feel small, irregular contractions that eventually go away. These are called Braxton Hicks contractions, or false labor. Contractions may last for hours, days, or even weeks before true labor sets in. If contractions come at regular intervals, intensify, or become painful, it is best to be seen by your caregiver.  SIGNS OF LABOR   Menstrual-like cramps.  Contractions that are 5 minutes apart or less.  Contractions that start on the top of the uterus and spread down to the lower abdomen and back.  A sense of  increased pelvic pressure or back pain.  A watery or bloody mucus discharge that comes from the vagina. If you have any of these signs before the 37th week of pregnancy, call your caregiver right away. You need to go to the hospital to get checked immediately. HOME CARE INSTRUCTIONS   Avoid all smoking, herbs, alcohol, and unprescribed drugs. These chemicals affect the formation and growth of the baby.  Do not use any tobacco products, including cigarettes, chewing tobacco, and electronic cigarettes. If you need help quitting, ask your health care provider. You may receive counseling support and  other resources to help you quit.  Follow your caregiver's instructions regarding medicine use. There are medicines that are either safe or unsafe to take during pregnancy.  Exercise only as directed by your caregiver. Experiencing uterine cramps is a good sign to stop exercising.  Continue to eat regular, healthy meals.  Wear a good support bra for breast tenderness.  Do not use hot tubs, steam rooms, or saunas.  Wear your seat belt at all times when driving.  Avoid raw meat, uncooked cheese, cat litter boxes, and soil used by cats. These carry germs that can cause birth defects in the baby.  Take your prenatal vitamins.  Take 1500-2000 mg of calcium daily starting at the 20th week of pregnancy until you deliver your baby.  Try taking a stool softener (if your caregiver approves) if you develop constipation. Eat more high-fiber foods, such as fresh vegetables or fruit and whole grains. Drink plenty of fluids to keep your urine clear or pale yellow.  Take warm sitz baths to soothe any pain or discomfort caused by hemorrhoids. Use hemorrhoid cream if your caregiver approves.  If you develop varicose veins, wear support hose. Elevate your feet for 15 minutes, 3-4 times a day. Limit salt in your diet.  Avoid heavy lifting, wear low heal shoes, and practice good posture.  Rest a lot with your legs elevated if you have leg cramps or low back pain.  Visit your dentist if you have not gone during your pregnancy. Use a soft toothbrush to brush your teeth and be gentle when you floss.  A sexual relationship may be continued unless your caregiver directs you otherwise.  Do not travel far distances unless it is absolutely necessary and only with the approval of your caregiver.  Take prenatal classes to understand, practice, and ask questions about the labor and delivery.  Make a trial run to the hospital.  Pack your hospital bag.  Prepare the baby's nursery.  Continue to go to all  your prenatal visits as directed by your caregiver. SEEK MEDICAL CARE IF:  You are unsure if you are in labor or if your water has broken.  You have dizziness.  You have mild pelvic cramps, pelvic pressure, or nagging pain in your abdominal area.  You have persistent nausea, vomiting, or diarrhea.  You have a bad smelling vaginal discharge.  You have pain with urination. SEEK IMMEDIATE MEDICAL CARE IF:   You have a fever.  You are leaking fluid from your vagina.  You have spotting or bleeding from your vagina.  You have severe abdominal cramping or pain.  You have rapid weight loss or gain.  You have shortness of breath with chest pain.  You notice sudden or extreme swelling of your face, hands, ankles, feet, or legs.  You have not felt your baby move in over an hour.  You have severe headaches that do not go away  with medicine.  You have vision changes.   This information is not intended to replace advice given to you by your health care provider. Make sure you discuss any questions you have with your health care provider.   Document Released: 09/26/2001 Document Revised: 10/23/2014 Document Reviewed: 12/03/2012 Elsevier Interactive Patient Education 2016 [employer] Inc[employer] Fetal Movement Counts Patient Name: __________________________________________________ Patient Due Date: ____________________ Performing a fetal movement count is highly recommended in high-risk pregnancies, but it is good for every pregnant woman to do. Your health care provider may ask you to start counting fetal movements at 28 weeks of the pregnancy. Fetal movements often increase:  After eating a full meal.  After physical activity.  After eating or drinking something sweet or cold.  At rest. Pay attention to when you feel the baby is most active. This will help you notice a pattern of your baby's sleep and wake cycles and what factors contribute to an increase in fetal movement. It is  important to perform a fetal movement count at the same time each day when your baby is normally most active.  HOW TO COUNT FETAL MOVEMENTS  Find a quiet and comfortable area to sit or lie down on your left side. Lying on your left side provides the best blood and oxygen circulation to your baby.  Write down the day and time on a sheet of paper or in a journal.  Start counting kicks, flutters, swishes, rolls, or jabs in a 2-hour period. You should feel at least 10 movements within 2 hours.  If you do not feel 10 movements in 2 hours, wait 2-3 hours and count again. Look for a change in the pattern or not enough counts in 2 hours. SEEK MEDICAL CARE IF:  You feel less than 10 counts in 2 hours, tried twice.  There is no movement in over an hour.  The pattern is changing or taking longer each day to reach 10 counts in 2 hours.  You feel the baby is not moving as he or she usually does. Date: ____________ Movements: ____________ Start time: ____________ Doreatha Martin time: ____________  Date: ____________ Movements: ____________ Start time: ____________ Doreatha Martin time: ____________ Date: ____________ Movements: ____________ Start time: ____________ Doreatha Martin time: ____________ Date: ____________ Movements: ____________ Start time: ____________ Doreatha Martin time: ____________ Date: ____________ Movements: ____________ Start time: ____________ Doreatha Martin time: ____________ Date: ____________ Movements: ____________ Start time: ____________ Doreatha Martin time: ____________ Date: ____________ Movements: ____________ Start time: ____________ Doreatha Martin time: ____________ Date: ____________ Movements: ____________ Start time: ____________ Doreatha Martin time: ____________  Date: ____________ Movements: ____________ Start time: ____________ Doreatha Martin time: ____________ Date: ____________ Movements: ____________ Start time: ____________ Doreatha Martin time: ____________ Date: ____________ Movements: ____________ Start time: ____________ Doreatha Martin time:  ____________ Date: ____________ Movements: ____________ Start time: ____________ Doreatha Martin time: ____________ Date: ____________ Movements: ____________ Start time: ____________ Doreatha Martin time: ____________ Date: ____________ Movements: ____________ Start time: ____________ Doreatha Martin time: ____________ Date: ____________ Movements: ____________ Start time: ____________ Doreatha Martin time: ____________  Date: ____________ Movements: ____________ Start time: ____________ Doreatha Martin time: ____________ Date: ____________ Movements: ____________ Start time: ____________ Doreatha Martin time: ____________ Date: ____________ Movements: ____________ Start time: ____________ Doreatha Martin time: ____________ Date: ____________ Movements: ____________ Start time: ____________ Doreatha Martin time: ____________ Date: ____________ Movements: ____________ Start time: ____________ Doreatha Martin time: ____________ Date: ____________ Movements: ____________ Start time: ____________ Doreatha Martin time: ____________ Date: ____________ Movements: ____________ Start time: ____________ Doreatha Martin time: ____________  Date: ____________ Movements: ____________ Start time: ____________ Doreatha Martin time: ____________ Date: ____________ Movements: ____________ Start time: ____________ Doreatha Martin time: ____________ Date: ____________ Movements:  ____________ Start time: ____________ Doreatha Martin time: ____________ Date: ____________ Movements: ____________ Start time: ____________ Doreatha Martin time: ____________ Date: ____________ Movements: ____________ Start time: ____________ Doreatha Martin time: ____________ Date: ____________ Movements: ____________ Start time: ____________ Doreatha Martin time: ____________ Date: ____________ Movements: ____________ Start time: ____________ Doreatha Martin time: ____________  Date: ____________ Movements: ____________ Start time: ____________ Doreatha Martin time: ____________ Date: ____________ Movements: ____________ Start time: ____________ Doreatha Martin time: ____________ Date: ____________ Movements:  ____________ Start time: ____________ Doreatha Martin time: ____________ Date: ____________ Movements: ____________ Start time: ____________ Doreatha Martin time: ____________ Date: ____________ Movements: ____________ Start time: ____________ Doreatha Martin time: ____________ Date: ____________ Movements: ____________ Start time: ____________ Doreatha Martin time: ____________ Date: ____________ Movements: ____________ Start time: ____________ Doreatha Martin time: ____________  Date: ____________ Movements: ____________ Start time: ____________ Doreatha Martin time: ____________ Date: ____________ Movements: ____________ Start time: ____________ Doreatha Martin time: ____________ Date: ____________ Movements: ____________ Start time: ____________ Doreatha Martin time: ____________ Date: ____________ Movements: ____________ Start time: ____________ Doreatha Martin time: ____________ Date: ____________ Movements: ____________ Start time: ____________ Doreatha Martin time: ____________ Date: ____________ Movements: ____________ Start time: ____________ Doreatha Martin time: ____________ Date: ____________ Movements: ____________ Start time: ____________ Doreatha Martin time: ____________  Date: ____________ Movements: ____________ Start time: ____________ Doreatha Martin time: ____________ Date: ____________ Movements: ____________ Start time: ____________ Doreatha Martin time: ____________ Date: ____________ Movements: ____________ Start time: ____________ Doreatha Martin time: ____________ Date: ____________ Movements: ____________ Start time: ____________ Doreatha Martin time: ____________ Date: ____________ Movements: ____________ Start time: ____________ Doreatha Martin time: ____________ Date: ____________ Movements: ____________ Start time: ____________ Doreatha Martin time: ____________ Date: ____________ Movements: ____________ Start time: ____________ Doreatha Martin time: ____________  Date: ____________ Movements: ____________ Start time: ____________ Doreatha Martin time: ____________ Date: ____________ Movements: ____________ Start time: ____________ Doreatha Martin  time: ____________ Date: ____________ Movements: ____________ Start time: ____________ Doreatha Martin time: ____________ Date: ____________ Movements: ____________ Start time: ____________ Doreatha Martin time: ____________ Date: ____________ Movements: ____________ Start time: ____________ Doreatha Martin time: ____________ Date: ____________ Movements: ____________ Start time: ____________ Doreatha Martin time: ____________   This information is not intended to replace advice given to you by your health care provider. Make sure you discuss any questions you have with your health care provider.   Document Released: 11/01/2006 Document Revised: 10/23/2014 Document Reviewed: 07/29/2012 Elsevier Interactive Patient Education 2016 [employer] of the uterus can occur throughout pregnancy. Contractions are not always a sign that you are in labor.  WHAT ARE BRAXTON HICKS CONTRACTIONS?  Contractions that occur before labor are called Braxton Hicks contractions, or false labor. Toward the end of pregnancy (32-34 weeks), these contractions can develop more often and may become more forceful. This is not true labor because these contractions do not result in opening (dilatation) and thinning of the cervix. They are sometimes difficult to tell apart from true labor because these contractions can be forceful and people have different pain tolerances. You should not feel embarrassed if you go to the hospital with false labor. Sometimes, the only way to tell if you are in true labor is for your health care provider to look for changes in the cervix. If there are no prenatal problems or other health problems associated with the pregnancy, it is completely safe to be sent home with false labor and await the onset of true labor. HOW CAN YOU TELL THE DIFFERENCE BETWEEN TRUE AND FALSE LABOR? False Labor  The contractions of false labor are usually shorter and not as hard as those of true labor.   The  contractions are usually irregular.   The contractions are often felt in the front of the lower abdomen and in the  groin.   The contractions may go away when you walk around or change positions while lying down.   The contractions get weaker and are shorter lasting as time goes on.   The contractions do not usually become progressively stronger, regular, and closer together as with true labor.  True Labor  Contractions in true labor last 30-70 seconds, become very regular, usually become more intense, and increase in frequency.   The contractions do not go away with walking.   The discomfort is usually felt in the top of the uterus and spreads to the lower abdomen and low back.   True labor can be determined by your health care provider with an exam. This will show that the cervix is dilating and getting thinner.  WHAT TO REMEMBER  Keep up with your usual exercises and follow other instructions given by your health care provider.   Take medicines as directed by your health care provider.   Keep your regular prenatal appointments.   Eat and drink lightly if you think you are going into labor.   If Braxton Hicks contractions are making you uncomfortable:   Change your position from lying down or resting to walking, or from walking to resting.   Sit and rest in a tub of warm water.   Drink 2-3 glasses of water. Dehydration may cause these contractions.   Do slow and deep breathing several times an hour.  WHEN SHOULD I SEEK IMMEDIATE MEDICAL CARE? Seek immediate medical care if:  Your contractions become stronger, more regular, and closer together.   You have fluid leaking or gushing from your vagina.   You have a fever.   You pass blood-tinged mucus.   You have vaginal bleeding.   You have continuous abdominal pain.   You have low back pain that you never had before.   You feel your baby's head pushing down and causing pelvic pressure.    Your baby is not moving as much as it used to.    This information is not intended to replace advice given to you by your health care provider. Make sure you discuss any questions you have with your health care provider.   Document Released: 10/02/2005 Document Revised: 10/07/2013 Document Reviewed: 07/14/2013 Elsevier Interactive Patient Education [employer]

## 2016-03-02 NOTE — Progress Notes (Signed)
Notified of pt arrival in [facility] and request for cervical exam only. RN to check cervix and ok to discharge if unchanged and reactive FHR tracing

## 2016-03-03 ENCOUNTER — Ambulatory Visit (HOSPITAL_COMMUNITY)
Admission: RE | Admit: 2016-03-03 | Discharge: 2016-03-03 | Disposition: A | Discharge status: Home / Self Care | Payer: Medicaid Other | Source: Ambulatory Visit | Attending: Advanced Practice Midwife | Admitting: Advanced Practice Midwife

## 2016-03-03 ENCOUNTER — Other Ambulatory Visit: Payer: Self-pay | Admitting: Advanced Practice Midwife

## 2016-03-03 ENCOUNTER — Encounter (HOSPITAL_COMMUNITY): Payer: Self-pay

## 2016-03-03 DIAGNOSIS — O09219 Supervision of pregnancy with history of pre-term labor, unspecified trimester: Secondary | ICD-10-CM | POA: Diagnosis not present

## 2016-03-03 DIAGNOSIS — O0993 Supervision of high risk pregnancy, unspecified, third trimester: Secondary | ICD-10-CM

## 2016-03-03 DIAGNOSIS — O24410 Gestational diabetes mellitus in pregnancy, diet controlled: Secondary | ICD-10-CM

## 2016-03-03 DIAGNOSIS — Z1389 Encounter for screening for other disorder: Secondary | ICD-10-CM

## 2016-03-03 DIAGNOSIS — Z36 Encounter for antenatal screening of mother: Secondary | ICD-10-CM | POA: Diagnosis not present

## 2016-03-03 DIAGNOSIS — Z3A35 35 weeks gestation of pregnancy: Secondary | ICD-10-CM

## 2016-03-03 DIAGNOSIS — O09293 Supervision of pregnancy with other poor reproductive or obstetric history, third trimester: Secondary | ICD-10-CM | POA: Insufficient documentation

## 2016-03-03 DIAGNOSIS — O99333 Smoking (tobacco) complicating pregnancy, third trimester: Secondary | ICD-10-CM | POA: Diagnosis not present

## 2016-03-08 ENCOUNTER — Encounter: Payer: Self-pay | Admitting: *Deleted

## 2016-03-08 ENCOUNTER — Encounter (HOSPITAL_COMMUNITY): Payer: Self-pay | Admitting: *Deleted

## 2016-03-08 ENCOUNTER — Inpatient Hospital Stay (HOSPITAL_COMMUNITY)
Admission: AD | Admit: 2016-03-08 | Discharge: 2016-03-08 | Disposition: A | Discharge status: Home / Self Care | Payer: Medicaid Other | Source: Ambulatory Visit | Attending: Obstetrics & Gynecology | Admitting: Obstetrics & Gynecology

## 2016-03-08 DIAGNOSIS — R102 Pelvic and perineal pain: Secondary | ICD-10-CM | POA: Insufficient documentation

## 2016-03-08 DIAGNOSIS — Z87891 Personal history of nicotine dependence: Secondary | ICD-10-CM | POA: Insufficient documentation

## 2016-03-08 DIAGNOSIS — Z3A36 36 weeks gestation of pregnancy: Secondary | ICD-10-CM | POA: Diagnosis not present

## 2016-03-08 NOTE — MAU Provider Note (Signed)
History     CSN: [id number]  Arrival date and time: 03/08/16 2008   First Provider Initiated Contact with Patient 03/08/16 2054      Chief Complaint  Patient presents with  . Pelvic Pain   HPI   MRs. Herringshaw is a 30yo [id number] at 36weeks and 4days presenting for round ligament pain. Pain is bilateral and worse with movement. Also notes vaginal pressure and occasional contraction, but nothing scheduled. Continues to note fetal movement. Denies vaginal bleeding.  OB History    Gravida Para Term Preterm AB TAB SAB Ectopic Multiple Living   _0 0 1 0 0 5      Past Medical History  Diagnosis Date  . Anemia   . Headache(784.0)   . Urinary tract infection   . Pregnancy induced hypertension   . Hx of chlamydia infection   . Depression     ppd with first child  . 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 Gestational diabetes   . Anxiety     Past Surgical History  Procedure Laterality Date  . Colposcopy      Family History  Problem Relation Age of Onset  . Anesthesia problems Neg Hx   . Hypotension Neg Hx   . Malignant hyperthermia Neg Hx   . Pseudochol deficiency Neg Hx   . Other Neg Hx   . Cancer Neg Hx   . Heart disease Neg Hx   . Stroke Neg Hx   . Hearing loss Neg Hx   . Asthma Son   . Asthma Paternal Aunt     Social History  Substance Use Topics  . Smoking status: Former Smoker -- 0.25 packs/day    Quit date: 09/23/2015  . Smokeless tobacco: Never Used     Comment: quit  prior to preg  . Alcohol Use: No    Allergies: No Known Allergies  Prescriptions prior to admission  Medication Sig Dispense Refill Last Dose  . acetaminophen (TYLENOL) 500 MG tablet Take 1 tablet (500 mg total) by mouth every 6 (six) hours as needed. 30 tablet 2 03/07/2016 at Unknown time  . hydrOXYzine (ATARAX/VISTARIL) 25 MG tablet Take 1 tablet (25 mg total) by mouth every 6 (six) hours as needed for itching. 30 tablet 0 03/08/2016 at Unknown time  .  Prenatal Vit-Fe Fumarate-FA (PRENATAL VITAMINS PLUS) 27-1 MG TABS Take 1 tablet by mouth daily. Reported on 12/02/2015 30 tablet 2 03/08/2016 at Unknown time  . ranitidine (ZANTAC) 150 MG tablet Take 1 tablet (150 mg total) by mouth 2 (two) times daily. 60 tablet 1 03/08/2016 at Unknown time  . ACCU-CHEK FASTCLIX LANCETS MISC 1 Units by Does not apply route 4 (four) times daily. 2 each 6 Taking  . Blood Glucose Monitoring Suppl (ACCU-CHEK NANO SMARTVIEW) w/Device KIT 1 Units by Does not apply route QID. 1 kit 0 Taking  . docusate sodium (COLACE) 100 MG capsule Take 1 capsule (100 mg total) by mouth 2 (two) times daily as needed. (Patient not taking: Reported on 03/08/2016) 30 capsule 2 Not Taking at Unknown time  . glucose blood (ACCU-CHEK SMARTVIEW) test strip Use as instructed 100 each 12 Taking  . hydrocortisone 1 % ointment Apply 1 application topically 2 (two) times daily. (Patient not taking: Reported on 02/19/2016) 30 g 0 Not Taking at Unknown time    ROS Physical Exam   Blood pressure 107/68, pulse 104, temperature 97.9 F (36.6 C), temperature source Oral,  resp. rate 16, height 5' (1.524 m), weight 120 lb (54.432 kg), last menstrual period 06/26/2015, unknown if currently breastfeeding.  Physical Exam  Constitutional: She appears well-developed and well-nourished. No distress.  Cardiovascular: Normal rate and regular rhythm.   Respiratory: Effort normal. No respiratory distress.  Musculoskeletal: She exhibits no edema.  Bilateral round ligament tenderness with palpation.  Dilation: 3 Effacement (%): Thick Station: -3 Exam by:: Dr. Gerlean Ren   MAU Course  Procedures  MDM OMM for bilateral round ligament pain with resolution of pain. Cervical check unchanged from prior. Fetal monitor with 140bpm, moderate variability.  Assessment and Plan  # Round Ligament Pain: Resolved following OMM. Recommend Tylenol for additional pain. Stable for discharge.  # Contractions: Only one  contraction during visit. Not scheduled. No cervical change noted. Suspect Montine Circle. Stable for discharge. Return precautions given.  [facility] 03/08/2016, 8:55 PM   I have participated in the care of this patient and I agree with the above. Danaya Geddis CNM 1:50 AM 03/09/2016

## 2016-03-08 NOTE — MAU Note (Signed)
Patient presents at 2036 weeks gestation with c/o pelvic pain and pressure since last night. Fetus active. Denies bleeding but notes "normal" discharge.

## 2016-03-08 NOTE — Discharge Instructions (Signed)
Round Ligament Pain  The round ligament is a cord of muscle and tissue that helps to support the uterus. It can become a source of pain during pregnancy if it becomes stretched or twisted as the baby grows. The pain usually begins in the second trimester of pregnancy, and it can come and go until the baby is delivered. It is not a serious problem, and it does not cause harm to the baby.  Round ligament pain is usually a short, sharp, and pinching pain, but it can also be a dull, lingering, and aching pain. The pain is felt in the lower side of the abdomen or in the groin. It usually starts deep in the groin and moves up to the outside of the hip area. Pain can occur with:   A sudden change in position.   Rolling over in bed.   Coughing or sneezing.   Physical activity.  HOME CARE INSTRUCTIONS  Watch your condition for any changes. Take these steps to help with your pain:   When the pain starts, relax. Then try:    Sitting down.    Flexing your knees up to your abdomen.    Lying on your side with one pillow under your abdomen and another pillow between your legs.    Sitting in a warm bath for 15-20 minutes or until the pain goes away.   Take over-the-counter and prescription medicines only as told by your health care provider.   Move slowly when you sit and stand.   Avoid long walks if they cause pain.   Stop or lessen your physical activities if they cause pain.  SEEK MEDICAL CARE IF:   Your pain does not go away with treatment.   You feel pain in your back that you did not have before.   Your medicine is not helping.  SEEK IMMEDIATE MEDICAL CARE IF:   You develop a fever or chills.   You develop uterine contractions.   You develop vaginal bleeding.   You develop nausea or vomiting.   You develop diarrhea.   You have pain when you urinate.     This information is not intended to replace advice given to you by your health care provider. Make sure you discuss any questions you have with your health  care provider.     Document Released: 07/11/2008 Document Revised: 12/25/2011 Document Reviewed: 12/09/2014  Elsevier Interactive Patient Education 2016 Elsevier Inc[employer]

## 2016-03-09 NOTE — Progress Notes (Signed)
Due to pt [redacted] wk gestation.  Makena medication discarded.

## 2016-03-11 ENCOUNTER — Inpatient Hospital Stay (HOSPITAL_COMMUNITY): Payer: Medicaid Other

## 2016-03-11 ENCOUNTER — Inpatient Hospital Stay (HOSPITAL_COMMUNITY)
Admission: AD | Admit: 2016-03-11 | Discharge: 2016-03-11 | Disposition: A | Discharge status: Home / Self Care | Payer: Medicaid Other | Attending: Family Medicine | Admitting: Family Medicine

## 2016-03-11 DIAGNOSIS — O9989 Other specified diseases and conditions complicating pregnancy, childbirth and the puerperium: Secondary | ICD-10-CM

## 2016-03-11 DIAGNOSIS — O26893 Other specified pregnancy related conditions, third trimester: Secondary | ICD-10-CM | POA: Insufficient documentation

## 2016-03-11 DIAGNOSIS — Z87891 Personal history of nicotine dependence: Secondary | ICD-10-CM | POA: Diagnosis not present

## 2016-03-11 DIAGNOSIS — Z3A37 37 weeks gestation of pregnancy: Secondary | ICD-10-CM | POA: Insufficient documentation

## 2016-03-11 DIAGNOSIS — O24419 Gestational diabetes mellitus in pregnancy, unspecified control: Secondary | ICD-10-CM | POA: Insufficient documentation

## 2016-03-11 DIAGNOSIS — Z9119 Patient's noncompliance with other medical treatment and regimen: Secondary | ICD-10-CM | POA: Insufficient documentation

## 2016-03-11 DIAGNOSIS — R0781 Pleurodynia: Secondary | ICD-10-CM | POA: Insufficient documentation

## 2016-03-11 LAB — URINALYSIS, ROUTINE W REFLEX MICROSCOPIC
Bilirubin Urine: NEGATIVE
GLUCOSE, UA: NEGATIVE mg/dL
HGB URINE DIPSTICK: NEGATIVE
KETONES UR: NEGATIVE mg/dL
LEUKOCYTES UA: NEGATIVE
Nitrite: NEGATIVE
PROTEIN: NEGATIVE mg/dL
Specific Gravity, Urine: 1.01 (ref 1.005–1.030)
pH: 7 (ref 5.0–8.0)

## 2016-03-11 LAB — RAPID URINE DRUG SCREEN, HOSP PERFORMED
Amphetamines: NOT DETECTED
BARBITURATES: NOT DETECTED
BENZODIAZEPINES: NOT DETECTED
COCAINE: NOT DETECTED
Opiates: NOT DETECTED
TETRAHYDROCANNABINOL: NOT DETECTED

## 2016-03-11 NOTE — MAU Note (Addendum)
Patient presents today with pain in her ribs predominately on her left side x 2 weeks. Pt cannot think of any injury related incident that may have caused this pain.  Reports that otherwise her pregnancy has been noncomplicated but pt is anemic and gestational diabetic according to records

## 2016-03-11 NOTE — Discharge Instructions (Signed)
Call the clinic [phone]) or go to [facility] if:  You begin to have strong, frequent contractions  Your water breaks.  Sometimes it is a big gush of fluid, sometimes it is just a trickle that keeps getting your panties wet or running down your legs  You have vaginal bleeding.  It is normal to have a small amount of spotting if your cervix was checked.   You don't feel your baby moving like normal.  If you don't, get you something to eat and drink and lay down and focus on feeling your baby move.  You should feel at least 10 movements in 2 hours.  If you don't, you should call the office or go to [facility].    Can take tylenol and warm baths or ice to rib for pain  Braxton Hicks Contractions Contractions of the uterus can occur throughout pregnancy. Contractions are not always a sign that you are in labor.  WHAT ARE BRAXTON HICKS CONTRACTIONS?  Contractions that occur before labor are called Braxton Hicks contractions, or false labor. Toward the end of pregnancy (32-34 weeks), these contractions can develop more often and may become more forceful. This is not true labor because these contractions do not result in opening (dilatation) and thinning of the cervix. They are sometimes difficult to tell apart from true labor because these contractions can be forceful and people have different pain tolerances. You should not feel embarrassed if you go to the hospital with false labor. Sometimes, the only way to tell if you are in true labor is for your health care provider to look for changes in the cervix. If there are no prenatal problems or other health problems associated with the pregnancy, it is completely safe to be sent home with false labor and await the onset of true labor. HOW CAN YOU TELL THE DIFFERENCE BETWEEN TRUE AND FALSE LABOR? False Labor  The contractions of false labor are usually shorter and not as hard as those of true labor.   The contractions are usually  irregular.   The contractions are often felt in the front of the lower abdomen and in the groin.   The contractions may go away when you walk around or change positions while lying down.   The contractions get weaker and are shorter lasting as time goes on.   The contractions do not usually become progressively stronger, regular, and closer together as with true labor.  True Labor  Contractions in true labor last 30-70 seconds, become very regular, usually become more intense, and increase in frequency.   The contractions do not go away with walking.   The discomfort is usually felt in the top of the uterus and spreads to the lower abdomen and low back.   True labor can be determined by your health care provider with an exam. This will show that the cervix is dilating and getting thinner.  WHAT TO REMEMBER  Keep up with your usual exercises and follow other instructions given by your health care provider.   Take medicines as directed by your health care provider.   Keep your regular prenatal appointments.   Eat and drink lightly if you think you are going into labor.   If Braxton Hicks contractions are making you uncomfortable:   Change your position from lying down or resting to walking, or from walking to resting.   Sit and rest in a tub of warm water.   Drink 2-3 glasses of water. Dehydration may cause  these contractions.   Do slow and deep breathing several times an hour.  WHEN SHOULD I SEEK IMMEDIATE MEDICAL CARE? Seek immediate medical care if:  Your contractions become stronger, more regular, and closer together.   You have fluid leaking or gushing from your vagina.   You have a fever.   You pass blood-tinged mucus.   You have vaginal bleeding.   You have continuous abdominal pain.   You have low back pain that you never had before.   You feel your baby's head pushing down and causing pelvic pressure.   Your baby is not moving  as much as it used to.    This information is not intended to replace advice given to you by your health care provider. Make sure you discuss any questions you have with your health care provider.   Document Released: 10/02/2005 Document Revised: 10/07/2013 Document Reviewed: 07/14/2013 Elsevier Interactive Patient Education [employer]

## 2016-03-11 NOTE — MAU Provider Note (Signed)
History  Chief Complaint:  Rib Injury  Margaret Cain is a 30 y.o. [id number] female at 95w0dpresenting via EMS w/ report of Lt rib pain for weeks, 'just can't take it anymore'. Pain is constant, feels like it's the baby pushing into ribs, tried apap and baths w/o relief. Denies injury, recent cold/coughing, sob, cp, reflux/indigestion, states she had some vomiting and diarrhea yesterday. Was constipated while incarcerated during this pregnancy, but since she has been out she is having regular normal bm's. l   Reports active fetal movement, contractions: none, vaginal bleeding: none, membranes: intact. Denies uti s/s, abnormal/malodorous vag d/c or vulvovaginal itching/irritation.   [facility]  Next visit 6/1. Pregnancy complicated by incarceration just released few weeks ago, A1DM non-compliant w/ checking sugars- A1C 5.2 three weeks ago, h/o ptb, HSIL pap w/ CIN I colpo, fetal cerebral ventriculomegaly, anemia, smoker, borderline polyhydramnios, denies PICA Recent u/s ~1wks ago: efw 79%, AFI 230NM cephalic  Obstetrical History: OB History    Gravida Para Term Preterm AB TAB SAB Ectopic Multiple Living   '7 5 4 1 1 ' 0 1 0 0 5      Past Medical History: Past Medical History  Diagnosis Date  . Anemia   . Headache(784.0)   . Urinary tract infection   . Pregnancy induced hypertension   . Hx of chlamydia infection   . Depression     ppd with first child  . 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 Gestational diabetes   . Anxiety     Past Surgical History: Past Surgical History  Procedure Laterality Date  . Colposcopy      Social History: Social History   Social History  . Marital Status: Single    Spouse Name: N/A  . Number of Children: N/A  . Years of Education: N/A   Social History Main Topics  . Smoking status: Former Smoker -- 0.25 packs/day    Quit date: 09/23/2015  . Smokeless tobacco: Never Used     Comment: quit  prior to preg  .  Alcohol Use: No  . Drug Use: No  . Sexual Activity: Yes    Birth Control/ Protection: None     Comment: 1 partner   Other Topics Concern  . Not on file   Social History Narrative    Allergies: No Known Allergies  Prescriptions prior to admission  Medication Sig Dispense Refill Last Dose  . ACCU-CHEK FASTCLIX LANCETS MISC 1 Units by Does not apply route 4 (four) times daily. 2 each 6 Taking  . acetaminophen (TYLENOL) 500 MG tablet Take 1 tablet (500 mg total) by mouth every 6 (six) hours as needed. 30 tablet 2 03/07/2016 at Unknown time  . Blood Glucose Monitoring Suppl (ACCU-CHEK NANO SMARTVIEW) w/Device KIT 1 Units by Does not apply route QID. 1 kit 0 Taking  . docusate sodium (COLACE) 100 MG capsule Take 1 capsule (100 mg total) by mouth 2 (two) times daily as needed. (Patient not taking: Reported on 03/08/2016) 30 capsule 2 Not Taking at Unknown time  . glucose blood (ACCU-CHEK SMARTVIEW) test strip Use as instructed 100 each 12 Taking  . hydrocortisone 1 % ointment Apply 1 application topically 2 (two) times daily. (Patient not taking: Reported on 02/19/2016) 30 g 0 Not Taking at Unknown time  . hydrOXYzine (ATARAX/VISTARIL) 25 MG tablet Take 1 tablet (25 mg total) by mouth every 6 (six) hours as needed for itching. 30 tablet 0 03/08/2016  at Unknown time  . Prenatal Vit-Fe Fumarate-FA (PRENATAL VITAMINS PLUS) 27-1 MG TABS Take 1 tablet by mouth daily. Reported on 12/02/2015 30 tablet 2 03/08/2016 at Unknown time  . ranitidine (ZANTAC) 150 MG tablet Take 1 tablet (150 mg total) by mouth 2 (two) times daily. 60 tablet 1 03/08/2016 at Unknown time    Review of Systems  Pertinent pos/neg as indicated in HPI  Physical Exam  Blood pressure 116/66, pulse 108, temperature 98.2 F (36.8 C), resp. rate 18, height 5' (1.524 m), weight 52.617 kg (116 lb), last menstrual period 06/26/2015, unknown if currently breastfeeding. General appearance: alert, cooperative, no distress and saying 'Ow ow ow,  it hurts' Lungs: clear to auscultation bilaterally, normal effort Heart: regular rate and rhythm Lt rib just above fundus tender to palpation Abdomen: gravid, soft, non-tender Extremities: no edema DTR's 2+  SVE: 3/th/ballotable, vtx (per pt request) Fetal monitoring: FHR: 135 bpm, variability: moderate,  Accelerations: Present,  decelerations:  Absent Uterine activity: irregular  MAU Course  1820: Discussed w/ Dr. Nehemiah Settle, will get CXR to r/o pathology  Labs:  No results found for this or any previous visit (from the past 24 hour(s)).  Imaging:  CXR: skeletal structures unremarkable, no active cardiopulmonary dz  Assessment and Plan  A:  64w0dSIUP  [id number] Lt rib pain likely from baby pushing into ribs  Cat 1 FHR  Noncompliant A1DM P:  D/c home  Apap, warm baths or ice to area  Reviewed labor s/s, fkc  [facility]   BAnitha, KreiserCNM,WHNP-BC 5/27/20176:11 PM

## 2016-03-14 ENCOUNTER — Other Ambulatory Visit: Payer: Self-pay

## 2016-03-14 ENCOUNTER — Telehealth: Payer: Self-pay | Admitting: Obstetrics and Gynecology

## 2016-03-14 MED ORDER — TERCONAZOLE 0.4 % VA CREA: 1.0000 | TOPICAL_CREAM | Freq: Every day | VAGINAL | Status: DC

## 2016-03-14 NOTE — Telephone Encounter (Signed)
Pt thinks she has a yeast infection. She has been experiencing a lot of vaginal itching that started yesterday. I have called in some cream to help with symptoms. Pt has follow up appt on 03/16/2016

## 2016-03-14 NOTE — Telephone Encounter (Signed)
Got a yeast inf want meds called in   [facility]

## 2016-03-16 ENCOUNTER — Ambulatory Visit (INDEPENDENT_AMBULATORY_CARE_PROVIDER_SITE_OTHER): Payer: Medicaid Other | Admitting: Obstetrics & Gynecology

## 2016-03-16 ENCOUNTER — Encounter (HOSPITAL_COMMUNITY): Payer: Self-pay

## 2016-03-16 ENCOUNTER — Ambulatory Visit (HOSPITAL_COMMUNITY)
Admission: RE | Admit: 2016-03-16 | Discharge: 2016-03-16 | Disposition: A | Discharge status: Home / Self Care | Payer: Medicaid Other | Source: Ambulatory Visit | Attending: Obstetrics & Gynecology | Admitting: Obstetrics & Gynecology

## 2016-03-16 ENCOUNTER — Encounter: Payer: Self-pay | Admitting: Obstetrics & Gynecology

## 2016-03-16 ENCOUNTER — Telehealth: Payer: Self-pay | Admitting: Obstetrics and Gynecology

## 2016-03-16 VITALS — BP 95/60 | HR 92 | Wt 121.0 lb

## 2016-03-16 VITALS — BP 121/68 | HR 92

## 2016-03-16 DIAGNOSIS — Z9119 Patient's noncompliance with other medical treatment and regimen: Secondary | ICD-10-CM

## 2016-03-16 DIAGNOSIS — Z8751 Personal history of pre-term labor: Secondary | ICD-10-CM

## 2016-03-16 DIAGNOSIS — O24410 Gestational diabetes mellitus in pregnancy, diet controlled: Secondary | ICD-10-CM | POA: Diagnosis present

## 2016-03-16 DIAGNOSIS — Z3A37 37 weeks gestation of pregnancy: Secondary | ICD-10-CM | POA: Diagnosis not present

## 2016-03-16 DIAGNOSIS — Z91199 Patient's noncompliance with other medical treatment and regimen due to unspecified reason: Secondary | ICD-10-CM

## 2016-03-16 DIAGNOSIS — O99019 Anemia complicating pregnancy, unspecified trimester: Secondary | ICD-10-CM

## 2016-03-16 DIAGNOSIS — O350XX0 Maternal care for (suspected) central nervous system malformation in fetus, not applicable or unspecified: Secondary | ICD-10-CM

## 2016-03-16 DIAGNOSIS — O09899 Supervision of other high risk pregnancies, unspecified trimester: Secondary | ICD-10-CM

## 2016-03-16 DIAGNOSIS — O403XX0 Polyhydramnios, third trimester, not applicable or unspecified: Secondary | ICD-10-CM | POA: Diagnosis not present

## 2016-03-16 DIAGNOSIS — O0990 Supervision of high risk pregnancy, unspecified, unspecified trimester: Secondary | ICD-10-CM

## 2016-03-16 DIAGNOSIS — O09219 Supervision of pregnancy with history of pre-term labor, unspecified trimester: Secondary | ICD-10-CM

## 2016-03-16 DIAGNOSIS — O24419 Gestational diabetes mellitus in pregnancy, unspecified control: Secondary | ICD-10-CM

## 2016-03-16 DIAGNOSIS — O283 Abnormal ultrasonic finding on antenatal screening of mother: Secondary | ICD-10-CM

## 2016-03-16 DIAGNOSIS — O3509X0 Maternal care for (suspected) other central nervous system malformation or damage in fetus, not applicable or unspecified: Secondary | ICD-10-CM

## 2016-03-16 LAB — POCT URINALYSIS DIP (DEVICE)
BILIRUBIN URINE: NEGATIVE
Glucose, UA: NEGATIVE mg/dL
HGB URINE DIPSTICK: NEGATIVE
Ketones, ur: NEGATIVE mg/dL
NITRITE: NEGATIVE
PH: 6.5 (ref 5.0–8.0)
Protein, ur: NEGATIVE mg/dL
SPECIFIC GRAVITY, URINE: 1.015 (ref 1.005–1.030)
UROBILINOGEN UA: 1 mg/dL (ref 0.0–1.0)

## 2016-03-16 MED ORDER — ACCU-CHEK NANO SMARTVIEW W/DEVICE KIT: 1.0000 [IU] | PACK | Freq: Four times a day (QID) | Status: DC

## 2016-03-16 NOTE — Progress Notes (Signed)
Subjective:lots of pressure and contractions  Margaret Cain is a 30 y.o. [id number] at 68w5dbeing seen today for ongoing prenatal care.  She is currently monitored for the following issues for this high-risk pregnancy and has Smoker; Supervision of high-risk pregnancy; Gestational diabetes mellitus (GDM), antepartum; Pica; History of preterm delivery; Previous preterm delivery, antepartum; HSIL (high grade squamous intraepithelial lesion) on Pap smear of cervix; Pregnancy complicated by fetal cerebral ventriculomegaly; Abnormal fetal ultrasound; Noncompliant pregnant patient, antepartum; and Anemia affecting pregnancy, antepartum on her problem list.  Patient reports occasional contractions.  Contractions: Irregular. Vag. Bleeding: None.  Movement: Present. Denies leaking of fluid.   The following portions of the patient's history were reviewed and updated as appropriate: allergies, current medications, past family history, past medical history, past social history, past surgical history and problem list. Problem list updated.  Objective:   Filed Vitals:   03/16/16 1124  BP: 121/68  Pulse: 92    Fetal Status: Fetal Heart Rate (bpm): 138 Fundal Height: 37 cm Movement: Present     General:  Alert, oriented and cooperative. Patient is in no acute distress.  Skin: Skin is warm and dry. No rash noted.   Cardiovascular: Normal heart rate noted  Respiratory: Normal respiratory effort, no problems with respiration noted  Abdomen: Soft, gravid, appropriate for gestational age. Pain/Pressure: Present     Pelvic: Vag. Bleeding: None     Cervical exam performed        Extremities: Normal range of motion.  Edema: None  Mental Status: Normal mood and affect. Normal behavior. Normal judgment and thought content.   Urinalysis: Urine Protein: Negative Urine Glucose: Negative  Assessment and Plan:  Pregnancy: [id number] 340w5d1. Diet controlled gestational diabetes mellitus (GDM), antepartum Needs  new meter, last BG was 70 done in EMS 5 days ago  2. Gestational diabetes mellitus in second trimester, unspecified diabetic control Non compliant - Blood Glucose Monitoring Suppl (ACCU-CHEK NANO SMARTVIEW) w/Device KIT; 1 Units by Does not apply route QID.  Dispense: 1 kit; Refill: 0  Term labor symptoms and general obstetric precautions including but not limited to vaginal bleeding, contractions, leaking of fluid and fetal movement were reviewed in detail with the patient. Please refer to After Visit Summary for other counseling recommendations.  Return in about 4 days (around 03/20/2016). Polyhydramnios, needs fetal testing and f/u AFI  JaWoodroe ModeMD

## 2016-03-16 NOTE — Progress Notes (Signed)
Urine: mod amt wbc

## 2016-03-16 NOTE — Telephone Encounter (Signed)
OB Note Pt called back and okay for weekend IOL. L&D has slot for 0730 on Sunday @ 38/1. Pt amenable to this. Labor precautions given.   Cornelia Copaharlie Jesselle Laflamme, Jr MD Attending [facility]ulty [facility])

## 2016-03-16 NOTE — Telephone Encounter (Signed)
OB Note  I was called by MFM today re: Ms. Esmeralda ArthurKimber and her poly in the setting of poorly compliant GDM.  Her AFI is in the low 30s, cephalic. They recommend IOL 38wks.   I called Ms. Mora BellmanKimber's # in Epic [phone]) but this went straight to VM. I left a VM asking her to please call the hospital and ask for the Faculty [facility] on call doctor. I also called her emergency contact # [phone]) but the man that picked up had not heard of her.   She has a follow up scheduled for 4d, but if she is able to confirm that she could do be set up for an IOL this weekend, if L&D is available, then this can be set up for her.  Cornelia Copaharlie Kandas Oliveto, Jr MD Attending [facility]ulty [facility])

## 2016-03-17 ENCOUNTER — Telehealth (HOSPITAL_COMMUNITY): Payer: Self-pay | Admitting: *Deleted

## 2016-03-17 ENCOUNTER — Encounter (HOSPITAL_COMMUNITY): Payer: Self-pay | Admitting: *Deleted

## 2016-03-17 NOTE — Telephone Encounter (Signed)
Preadmission screen  

## 2016-03-19 ENCOUNTER — Inpatient Hospital Stay (HOSPITAL_COMMUNITY)
Admission: RE | Admit: 2016-03-19 | Discharge: 2016-03-21 | DRG: 775 | Disposition: A | Discharge status: Home / Self Care | Payer: Medicaid Other | Source: Ambulatory Visit | Attending: Obstetrics & Gynecology | Admitting: Obstetrics & Gynecology

## 2016-03-19 ENCOUNTER — Inpatient Hospital Stay (HOSPITAL_COMMUNITY): Payer: Medicaid Other | Admitting: Anesthesiology

## 2016-03-19 ENCOUNTER — Encounter (HOSPITAL_COMMUNITY): Payer: Self-pay

## 2016-03-19 VITALS — BP 100/56 | HR 59 | Temp 97.7°F | Resp 18 | Ht 60.0 in | Wt 121.0 lb

## 2016-03-19 DIAGNOSIS — R87613 High grade squamous intraepithelial lesion on cytologic smear of cervix (HGSIL): Secondary | ICD-10-CM | POA: Diagnosis present

## 2016-03-19 DIAGNOSIS — Z825 Family history of asthma and other chronic lower respiratory diseases: Secondary | ICD-10-CM | POA: Diagnosis not present

## 2016-03-19 DIAGNOSIS — O350XX0 Maternal care for (suspected) central nervous system malformation in fetus, not applicable or unspecified: Secondary | ICD-10-CM

## 2016-03-19 DIAGNOSIS — O24419 Gestational diabetes mellitus in pregnancy, unspecified control: Secondary | ICD-10-CM

## 2016-03-19 DIAGNOSIS — Z9119 Patient's noncompliance with other medical treatment and regimen: Secondary | ICD-10-CM

## 2016-03-19 DIAGNOSIS — O3509X0 Maternal care for (suspected) other central nervous system malformation or damage in fetus, not applicable or unspecified: Secondary | ICD-10-CM

## 2016-03-19 DIAGNOSIS — O24420 Gestational diabetes mellitus in childbirth, diet controlled: Principal | ICD-10-CM | POA: Diagnosis present

## 2016-03-19 DIAGNOSIS — O09899 Supervision of other high risk pregnancies, unspecified trimester: Secondary | ICD-10-CM

## 2016-03-19 DIAGNOSIS — Z87891 Personal history of nicotine dependence: Secondary | ICD-10-CM

## 2016-03-19 DIAGNOSIS — Z8751 Personal history of pre-term labor: Secondary | ICD-10-CM

## 2016-03-19 DIAGNOSIS — Z3A38 38 weeks gestation of pregnancy: Secondary | ICD-10-CM

## 2016-03-19 DIAGNOSIS — O99019 Anemia complicating pregnancy, unspecified trimester: Secondary | ICD-10-CM

## 2016-03-19 DIAGNOSIS — O283 Abnormal ultrasonic finding on antenatal screening of mother: Secondary | ICD-10-CM

## 2016-03-19 DIAGNOSIS — O09219 Supervision of pregnancy with history of pre-term labor, unspecified trimester: Secondary | ICD-10-CM

## 2016-03-19 DIAGNOSIS — O0990 Supervision of high risk pregnancy, unspecified, unspecified trimester: Secondary | ICD-10-CM

## 2016-03-19 DIAGNOSIS — R87610 Atypical squamous cells of undetermined significance on cytologic smear of cervix (ASC-US): Secondary | ICD-10-CM | POA: Diagnosis present

## 2016-03-19 DIAGNOSIS — R87810 Cervical high risk human papillomavirus (HPV) DNA test positive: Secondary | ICD-10-CM

## 2016-03-19 LAB — CBC
HEMATOCRIT: 25.4 % — AB (ref 36.0–46.0)
HEMOGLOBIN: 9.2 g/dL — AB (ref 12.0–15.0)
MCH: 34.1 pg — ABNORMAL HIGH (ref 26.0–34.0)
MCHC: 36.2 g/dL — AB (ref 30.0–36.0)
MCV: 94.1 fL (ref 78.0–100.0)
Platelets: 168 10*3/uL (ref 150–400)
RBC: 2.7 MIL/uL — AB (ref 3.87–5.11)
RDW: 16.9 % — ABNORMAL HIGH (ref 11.5–15.5)
WBC: 7.2 10*3/uL (ref 4.0–10.5)

## 2016-03-19 LAB — GLUCOSE, CAPILLARY: GLUCOSE-CAPILLARY: 90 mg/dL (ref 65–99)

## 2016-03-19 LAB — RPR: RPR: NONREACTIVE

## 2016-03-19 LAB — TYPE AND SCREEN
ABO/RH(D): O POS
ANTIBODY SCREEN: NEGATIVE

## 2016-03-19 MED ORDER — PRENATAL MULTIVITAMIN CH
1.0000 | ORAL_TABLET | Freq: Every day | ORAL | Status: DC
  Administered 2016-03-20 – 2016-03-21 (×2): 1 via ORAL
  Filled 2016-03-19 (×2): qty 1

## 2016-03-19 MED ORDER — LACTATED RINGERS IV SOLN
500.0000 mL | Freq: Once | INTRAVENOUS | Status: AC
  Administered 2016-03-19: 500 mL via INTRAVENOUS

## 2016-03-19 MED ORDER — ZOLPIDEM TARTRATE 5 MG PO TABS: 5.0000 mg | ORAL_TABLET | Freq: Every evening | ORAL | Status: DC | PRN

## 2016-03-19 MED ORDER — EPHEDRINE 5 MG/ML INJ: 10.0000 mg | INTRAVENOUS | Status: DC | PRN

## 2016-03-19 MED ORDER — ACETAMINOPHEN 325 MG PO TABS: 650.0000 mg | ORAL_TABLET | ORAL | Status: DC | PRN

## 2016-03-19 MED ORDER — COCONUT OIL OIL: 1.0000 "application " | TOPICAL_OIL | Status: DC | PRN

## 2016-03-19 MED ORDER — ONDANSETRON HCL 4 MG/2ML IJ SOLN: 4.0000 mg | Freq: Four times a day (QID) | INTRAMUSCULAR | Status: DC | PRN

## 2016-03-19 MED ORDER — LACTATED RINGERS IV SOLN: 500.0000 mL | INTRAVENOUS | Status: DC | PRN

## 2016-03-19 MED ORDER — OXYTOCIN 40 UNITS IN LACTATED RINGERS INFUSION - SIMPLE MED: 2.5000 [IU]/h | Freq: Once | INTRAVENOUS | Status: DC | PRN

## 2016-03-19 MED ORDER — WITCH HAZEL-GLYCERIN EX PADS: 1.0000 "application " | MEDICATED_PAD | CUTANEOUS | Status: DC | PRN

## 2016-03-19 MED ORDER — SOD CITRATE-CITRIC ACID 500-334 MG/5ML PO SOLN: 30.0000 mL | ORAL | Status: DC | PRN

## 2016-03-19 MED ORDER — IBUPROFEN 600 MG PO TABS
600.0000 mg | ORAL_TABLET | Freq: Four times a day (QID) | ORAL | Status: DC
  Administered 2016-03-19 – 2016-03-21 (×7): 600 mg via ORAL
  Filled 2016-03-19 (×8): qty 1

## 2016-03-19 MED ORDER — ONDANSETRON HCL 4 MG/2ML IJ SOLN
4.0000 mg | Freq: Once | INTRAMUSCULAR | Status: AC
  Administered 2016-03-19: 4 mg via INTRAVENOUS
  Filled 2016-03-19: qty 2

## 2016-03-19 MED ORDER — OXYCODONE-ACETAMINOPHEN 5-325 MG PO TABS
1.0000 | ORAL_TABLET | ORAL | Status: DC | PRN
  Administered 2016-03-19 – 2016-03-20 (×4): 1 via ORAL
  Filled 2016-03-19 (×4): qty 1

## 2016-03-19 MED ORDER — LIDOCAINE HCL (PF) 1 % IJ SOLN
INTRAMUSCULAR | Status: DC | PRN
  Administered 2016-03-19 (×2): 5 mL via EPIDURAL

## 2016-03-19 MED ORDER — MEASLES, MUMPS & RUBELLA VAC ~~LOC~~ INJ
0.5000 mL | INJECTION | Freq: Once | SUBCUTANEOUS | Status: DC
  Filled 2016-03-19: qty 0.5

## 2016-03-19 MED ORDER — OXYTOCIN BOLUS FROM INFUSION
500.0000 mL | Freq: Once | INTRAVENOUS | Status: AC | PRN
  Administered 2016-03-19: 500 mL via INTRAVENOUS

## 2016-03-19 MED ORDER — DIBUCAINE 1 % RE OINT: 1.0000 "application " | TOPICAL_OINTMENT | RECTAL | Status: DC | PRN

## 2016-03-19 MED ORDER — SODIUM CHLORIDE 0.9% FLUSH: 3.0000 mL | Freq: Two times a day (BID) | INTRAVENOUS | Status: DC

## 2016-03-19 MED ORDER — PHENYLEPHRINE 40 MCG/ML (10ML) SYRINGE FOR IV PUSH (FOR BLOOD PRESSURE SUPPORT): 80.0000 ug | PREFILLED_SYRINGE | INTRAVENOUS | Status: DC | PRN

## 2016-03-19 MED ORDER — LIDOCAINE HCL (PF) 1 % IJ SOLN
30.0000 mL | INTRAMUSCULAR | Status: DC | PRN
Start: 2016-03-19 — End: 2016-03-19
  Filled 2016-03-19: qty 30

## 2016-03-19 MED ORDER — TETANUS-DIPHTH-ACELL PERTUSSIS 5-2.5-18.5 LF-MCG/0.5 IM SUSP: 0.5000 mL | Freq: Once | INTRAMUSCULAR | Status: DC

## 2016-03-19 MED ORDER — OXYCODONE-ACETAMINOPHEN 5-325 MG PO TABS: 2.0000 | ORAL_TABLET | ORAL | Status: DC | PRN

## 2016-03-19 MED ORDER — BENZOCAINE-MENTHOL 20-0.5 % EX AERO: 1.0000 "application " | INHALATION_SPRAY | CUTANEOUS | Status: DC | PRN

## 2016-03-19 MED ORDER — SODIUM CHLORIDE 0.9 % IV SOLN: 250.0000 mL | INTRAVENOUS | Status: DC | PRN

## 2016-03-19 MED ORDER — ONDANSETRON HCL 4 MG PO TABS: 4.0000 mg | ORAL_TABLET | ORAL | Status: DC | PRN

## 2016-03-19 MED ORDER — LACTATED RINGERS IV SOLN
INTRAVENOUS | Status: DC
  Administered 2016-03-19 (×2): via INTRAVENOUS

## 2016-03-19 MED ORDER — TERBUTALINE SULFATE 1 MG/ML IJ SOLN: 0.2500 mg | Freq: Once | INTRAMUSCULAR | Status: DC | PRN

## 2016-03-19 MED ORDER — DIPHENHYDRAMINE HCL 50 MG/ML IJ SOLN: 12.5000 mg | INTRAMUSCULAR | Status: DC | PRN

## 2016-03-19 MED ORDER — SENNOSIDES-DOCUSATE SODIUM 8.6-50 MG PO TABS
2.0000 | ORAL_TABLET | ORAL | Status: DC
  Administered 2016-03-20 (×2): 2 via ORAL
  Filled 2016-03-19 (×2): qty 2

## 2016-03-19 MED ORDER — FENTANYL 2.5 MCG/ML BUPIVACAINE 1/10 % EPIDURAL INFUSION (WH - ANES)
14.0000 mL/h | INTRAMUSCULAR | Status: DC | PRN
  Administered 2016-03-19: 12 mL/h via EPIDURAL
  Administered 2016-03-19: 14 mL/h via EPIDURAL
  Filled 2016-03-19: qty 125

## 2016-03-19 MED ORDER — PHENYLEPHRINE 40 MCG/ML (10ML) SYRINGE FOR IV PUSH (FOR BLOOD PRESSURE SUPPORT)
80.0000 ug | PREFILLED_SYRINGE | INTRAVENOUS | Status: DC | PRN
  Filled 2016-03-19: qty 10

## 2016-03-19 MED ORDER — SIMETHICONE 80 MG PO CHEW: 80.0000 mg | CHEWABLE_TABLET | ORAL | Status: DC | PRN

## 2016-03-19 MED ORDER — SODIUM CHLORIDE 0.9% FLUSH: 3.0000 mL | INTRAVENOUS | Status: DC | PRN

## 2016-03-19 MED ORDER — OXYTOCIN 40 UNITS IN LACTATED RINGERS INFUSION - SIMPLE MED
1.0000 m[IU]/min | INTRAVENOUS | Status: DC
  Administered 2016-03-19: 2 m[IU]/min via INTRAVENOUS
  Filled 2016-03-19: qty 1000

## 2016-03-19 MED ORDER — PHENYLEPHRINE 40 MCG/ML (10ML) SYRINGE FOR IV PUSH (FOR BLOOD PRESSURE SUPPORT)
80.0000 ug | PREFILLED_SYRINGE | INTRAVENOUS | Status: DC | PRN
Start: 2016-03-19 — End: 2016-03-19

## 2016-03-19 MED ORDER — DIPHENHYDRAMINE HCL 25 MG PO CAPS: 25.0000 mg | ORAL_CAPSULE | Freq: Four times a day (QID) | ORAL | Status: DC | PRN

## 2016-03-19 MED ORDER — LACTATED RINGERS IV SOLN: 500.0000 mL | Freq: Once | INTRAVENOUS | Status: DC

## 2016-03-19 MED ORDER — ONDANSETRON HCL 4 MG/2ML IJ SOLN: 4.0000 mg | INTRAMUSCULAR | Status: DC | PRN

## 2016-03-19 NOTE — Anesthesia Preprocedure Evaluation (Signed)
Anesthesia Evaluation  Patient identified by MRN, date of birth, ID band Patient awake    Reviewed: Allergy & Precautions, H&P , NPO status , Patient's Chart, lab work & pertinent test results, reviewed documented beta blocker date and time   Airway Mallampati: II  TM Distance: >3 FB Neck ROM: full    Dental no notable dental hx. (+) Dental Advisory Given, Teeth Intact   Pulmonary neg pulmonary ROS, former smoker,    Pulmonary exam normal breath sounds clear to auscultation       Cardiovascular Exercise Tolerance: Good hypertension, Normal cardiovascular exam Rhythm:regular Rate:Normal  PIH history. OK now   Neuro/Psych negative neurological ROS  negative psych ROS   GI/Hepatic negative GI ROS, Neg liver ROS,   Endo/Other  diabetesGestational diabetes - diet controlled  Renal/GU negative Renal ROS  negative genitourinary   Musculoskeletal   Abdominal   Peds  Hematology negative hematology ROS (+)   Anesthesia Other Findings   Reproductive/Obstetrics negative OB ROS                             Anesthesia Physical Anesthesia Plan  ASA: II  Anesthesia Plan: Epidural   Post-op Pain Management:    Induction:   Airway Management Planned:   Additional Equipment:   Intra-op Plan:   Post-operative Plan:   Informed Consent: I have reviewed the patients History and Physical, chart, labs and discussed the procedure including the risks, benefits and alternatives for the proposed anesthesia with the patient or authorized representative who has indicated his/her understanding and acceptance.   Dental Advisory Given  Plan Discussed with: CRNA  Anesthesia Plan Comments:         Anesthesia Quick Evaluation

## 2016-03-19 NOTE — H&P (Signed)
Margaret Cain is a 30 y.o. female [phone] @ 38.1 wks presenting for IOL for GDM diet controlled. Maternal Medical History:  Reason for admission: IOL for GDM  Fetal activity: Perceived fetal activity is normal.   Last perceived fetal movement was within the past hour.    Prenatal complications: GDM  Prenatal Complications - Diabetes: gestational.    OB History    Gravida Para Term Preterm AB TAB SAB Ectopic Multiple Living   7 5 4 1 1  0 1 0 0 5     Past Medical History  Diagnosis Date  . Anemia   . Headache(784.0)   . Urinary tract infection   . Pregnancy induced hypertension   . Hx of chlamydia infection   . Depression     ppd with first child  . 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 Anxiety   . Gestational diabetes     diet controlled   Past Surgical History  Procedure Laterality Date  . Colposcopy     Family History: family history includes Asthma in her paternal aunt and son. There is no history of Anesthesia problems, Hypotension, Malignant hyperthermia, Pseudochol deficiency, Other, Cancer, Heart disease, Stroke, or Hearing loss. Social History:  reports that she quit smoking about 5 months ago. She has never used smokeless tobacco. She reports that she does not drink alcohol or use illicit drugs.   Prenatal Transfer Tool  Maternal Diabetes: Yes:  Diabetes Type:  Diet controlled Genetic Screening: Normal Maternal Ultrasounds/Referrals: Normal Fetal Ultrasounds or other Referrals:  None Maternal Substance Abuse:  No Significant Maternal Medications:  None Significant Maternal Lab Results:  None Other Comments:  None  Review of Systems  Constitutional: Negative.   HENT: Negative.   Eyes: Negative.   Respiratory: Negative.   Cardiovascular: Negative.   Gastrointestinal: Negative.   Genitourinary: Negative.   Musculoskeletal: Negative.   Skin: Negative.   Neurological: Negative.   Endo/Heme/Allergies: Negative.       Blood  pressure 106/75, pulse 85, temperature 98.8 F (37.1 C), temperature source Oral, resp. rate 20, last menstrual period 06/26/2015, unknown if currently breastfeeding. Maternal Exam:  Uterine Assessment: No uc's  Abdomen: Patient reports no abdominal tenderness. Fetal presentation: vertex  Introitus: Normal vulva. Normal vagina.  Amniotic fluid character: not assessed.  Pelvis: adequate for delivery.   Cervix: Cervix evaluated by digital exam.     Fetal Exam Fetal Monitor Review: Mode: ultrasound.   Variability: moderate (6-25 bpm).   Pattern: accelerations present.       Physical Exam  Constitutional: She is oriented to person, place, and time. She appears well-developed and well-nourished.  HENT:  Head: Normocephalic.  Eyes: Pupils are equal, round, and reactive to light.  Cardiovascular: Normal rate, regular rhythm, normal heart sounds and intact distal pulses.   Respiratory: Effort normal and breath sounds normal.  GI: Soft. Bowel sounds are normal.  Genitourinary: Vagina normal and uterus normal.  Musculoskeletal: Normal range of motion.  Neurological: She is alert and oriented to person, place, and time. She has normal reflexes.  Skin: Skin is warm and dry.  Psychiatric: She has a normal mood and affect. Her behavior is normal. Judgment and thought content normal.    Prenatal labs: ABO, Rh: O/Positive/-- (11/23 0000) Antibody: Negative (11/23 0000) Rubella: Immune (11/23 0000) RPR: NON REAC (05/01 1044)  HBsAg: Negative (11/23 0000)  HIV: NONREACTIVE (05/01 1044)  GBS: Negative (05/15 0000)   Assessment/Plan: Preg at 38.1 wks.  Diet controlled GDM, IOL with pitocin. SVE 2-3/70/-2 vertex   Wyvonnia Dusky 03/19/2016, 8:53 AM

## 2016-03-19 NOTE — Progress Notes (Signed)
Margaret Cain is a 30 y.o. [id number] at 365w1d by ultrasound admitted for induction of labor due to Diabetes.  Subjective:   Objective: BP 116/71 mmHg  Pulse 85  Temp(Src) 98.8 F (37.1 C) (Oral)  Resp 18  Ht 5' (1.524 m)  Wt 121 lb (54.885 kg)  BMI 23.63 kg/m2  SpO2 100%  LMP 06/26/2015 (Exact Date)      FHT:  FHR: 130's bpm, variability: moderate,  accelerations:  Present,  decelerations:  Absent UC:   regular, every 2-5 minutes SVE:   Dilation: 2.5 Effacement (%): 70 Station: -2 Exam by:: Marlynn Perking. Cain, CNM  Labs: Lab Results  Component Value Date   WBC 7.2 03/19/2016   HGB 9.2* 03/19/2016   HCT 25.4* 03/19/2016   MCV 94.1 03/19/2016   PLT 168 03/19/2016    Assessment / Plan: Induction of labor due to gestational diabetes,  progressing well on pitocin  Labor: Progressing normally Preeclampsia:  no signs or symptoms of toxicity and intake and ouput balanced Fetal Wellbeing:  Category I Pain Control:  Epidural I/D:  n/a Anticipated MOD:  NSVD  Margaret Cain 03/19/2016, 11:31 AM

## 2016-03-19 NOTE — Anesthesia Procedure Notes (Signed)
Epidural Patient location during procedure: OB Start time: 03/19/2016 11:13 AM End time: 03/19/2016 11:18 AM  Staffing Anesthesiologist: Ronelle NighEWELL, Terrick Allred Performed by: anesthesiologist   Preanesthetic Checklist Completed: patient identified, site marked, surgical consent, pre-op evaluation, timeout performed, IV checked, risks and benefits discussed and monitors and equipment checked  Epidural Patient position: sitting Prep: site prepped and draped and DuraPrep Patient monitoring: continuous pulse ox and blood pressure Approach: midline Location: L3-L4 Injection technique: LOR air  Needle:  Needle type: Tuohy  Needle gauge: 17 G Needle length: 9 cm and 9 Needle insertion depth: 4 and 3 cm Catheter type: closed end flexible Catheter size: 19 Gauge Catheter at skin depth: 8 cm Test dose: negative  Assessment Events: blood not aspirated, injection not painful, no injection resistance, negative IV test and no paresthesia  Additional Notes Patient identified. Risks/Benefits/Options discussed with patient including but not limited to bleeding, infection, nerve damage, paralysis, failed block, incomplete pain control, headache, blood pressure changes, nausea, vomiting, reactions to medication both or allergic, itching and postpartum back pain. Confirmed with bedside nurse the patient's most recent platelet count. Confirmed with patient that they are not currently taking any anticoagulation, have any bleeding history or any family history of bleeding disorders. Patient expressed understanding and wished to proceed. All questions were answered. Sterile technique was used throughout the entire procedure. Please see nursing notes for vital signs. Test dose was given through epidural catheter and negative prior to continuing to dose epidural or start infusion. Warning signs of high block given to the patient including shortness of breath, tingling/numbness in hands, complete motor block, or any  concerning symptoms with instructions to call for help. Patient was given instructions on fall risk and not to get out of bed. All questions and concerns addressed with instructions to call with any issues or inadequate analgesia.

## 2016-03-20 LAB — GLUCOSE, CAPILLARY: Glucose-Capillary: 93 mg/dL (ref 65–99)

## 2016-03-20 NOTE — Progress Notes (Signed)
Post Partum Day 1 Subjective:  Margaret Cain is a 30 y.o. [id number] 1970w1d s/p SVD after IOL for A1GDM.  No acute events overnight.  Pt denies problems with ambulating, voiding or po intake.  She denies nausea or vomiting.  Pain is well controlled. Lochia Minimal.  Plan for birth control is Depo-Provera.  Method of Feeding: bottle  Objective: Blood pressure 99/45, pulse 57, temperature 97.6 F (36.4 C), temperature source Oral, resp. rate 18, height 5' (1.524 m), weight 121 lb (54.885 kg), last menstrual period 06/26/2015, SpO2 99 %, unknown if currently breastfeeding.  Physical Exam:  General: alert, cooperative and no distress Lochia:normal flow Chest: normal WOB Heart: Regular rate Abdomen: +BS, soft, mild TTP (appropriate) Uterine Fundus: firm DVT Evaluation: No evidence of DVT seen on physical exam. Extremities: no edema   Recent Labs  03/19/16 0755  HGB 9.2*  HCT 25.4*   Assessment/Plan:  ASSESSMENT: Margaret Cain is a 30 y.o. [id number] 3470w1d s/p SVD after IOL for A1GDM. FBG 90  Plan for discharge tomorrow Continue routine PP care Breastfeeding support PRN  LOS: 1 day   Almon Herculesaye T Gonfa 03/20/2016, 7:19 AM

## 2016-03-20 NOTE — Anesthesia Postprocedure Evaluation (Signed)
Anesthesia Post Note  Patient: Margaret Cain  Procedure(s) Performed: * No procedures listed *  Patient location during evaluation: Mother Baby Anesthesia Type: Epidural Level of consciousness: awake Pain management: satisfactory to patient Vital Signs Assessment: post-procedure vital signs reviewed and stable Respiratory status: spontaneous breathing Cardiovascular status: stable Anesthetic complications: no     Last Vitals:  Filed Vitals:   03/19/16 2235 03/20/16 0512  BP: 101/53 99/45  Pulse: 62 57  Temp: 37 C 36.4 C  Resp: 20 18    Last Pain:  Filed Vitals:   03/20/16 1234  PainSc: 7    Pain Goal: Patients Stated Pain Goal: 3 (03/20/16 1232)               Cephus ShellingBURGER,Shields Pautz
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
## 2016-03-20 NOTE — Progress Notes (Signed)
UR chart review completed.  

## 2016-03-21 MED ORDER — ACETAMINOPHEN 325 MG PO TABS: 650.0000 mg | ORAL_TABLET | ORAL | Status: DC | PRN

## 2016-03-21 MED ORDER — IBUPROFEN 600 MG PO TABS: 600.0000 mg | ORAL_TABLET | Freq: Four times a day (QID) | ORAL | Status: DC

## 2016-03-21 NOTE — Discharge Summary (Signed)
OB Discharge Summary     Patient Name: Margaret Cain DOB: [birth year] MRN: [id number]  Date of admission: 03/19/2016 Delivering MD: Koren Shiver D   Date of discharge: 03/21/2016  Admitting diagnosis: 66WKS INDUCTION  Intrauterine pregnancy: [redacted]w[redacted]d    Secondary diagnosis:  Active Problems:   Gestational diabetes mellitus (GDM), antepartum   HSIL (high grade squamous intraepithelial lesion) on Pap smear of cervix   Labor and delivery indication for care or intervention   SVD (spontaneous vaginal delivery)  Additional problems: none     Discharge diagnosis: Term Pregnancy Delivered and GDM A1                                                                                                Post partum procedures:none  Augmentation: Pitocin  Complications: None  Hospital course:  Induction of Labor With Vaginal Delivery   30y.o. yo [id number] 346w1das admitted to the hospital 03/19/2016 for induction of labor.  Indication for induction: A1 DM.  Patient had an uncomplicated labor course as follows: Membrane Rupture Time/Date: 2:15 PM ,03/19/2016   Intrapartum Procedures: Episiotomy: None [1]                                         Lacerations:  None [1]  Patient had delivery of a Viable infant.  Information for the patient's newborn:  Margaret Cain, Guillot0[[id number]]Delivery Method: Vaginal, Spontaneous Delivery (Filed from Delivery Summary)   03/19/2016  Details of delivery can be found in separate delivery note.  Patient had a routine postpartum course. Patient is discharged home 03/21/2016.  Fasting glucose 90.   Physical exam  Filed Vitals:   03/19/16 2235 03/20/16 0512 03/20/16 1833 03/21/16 0537  BP: 101/53 99/45 117/71 100/56  Pulse: 62 57 79 59  Temp: 98.6 F (37 C) 97.6 F (36.4 C) 98 F (36.7 C) 97.7 F (36.5 C)  TempSrc: Oral Oral Oral Oral  Resp: _0 Height:      Weight:      SpO2:       General: alert, cooperative and no distress Lochia:  appropriate Uterine Fundus: firm DVT Evaluation: No evidence of DVT seen on physical exam. Labs: Lab Results  Component Value Date   WBC 7.2 03/19/2016   HGB 9.2* 03/19/2016   HCT 25.4* 03/19/2016   MCV 94.1 03/19/2016   PLT 168 03/19/2016   CMP Latest Ref Rng 01/03/2016  Glucose 65 - 99 mg/dL 75  BUN 6 - 20 mg/dL <5(L)  Creatinine 0.44 - 1.00 mg/dL 0.56  Sodium 135 - 145 mmol/L 136  Potassium 3.5 - 5.1 mmol/L 3.6  Chloride 101 - 111 mmol/L 104  CO2 22 - 32 mmol/L 27  Calcium 8.9 - 10.3 mg/dL 8.7(L)  Total Protein 6.5 - 8.1 g/dL 7.0  Total Bilirubin 0.3 - 1.2 mg/dL 0.6  Alkaline Phos 38 - 126 U/L 71  AST 15 - 41 U/L 16  ALT 14 - 54 U/L  6(L)    Discharge instruction: per After Visit Summary and "Baby and Me Booklet".  After visit meds:    Medication List    STOP taking these medications        ACCU-CHEK FASTCLIX LANCETS Misc     ACCU-CHEK NANO SMARTVIEW w/Device Kit     glucose blood test strip  Commonly known as:  ACCU-CHEK SMARTVIEW     hydrocortisone 1 % ointment     hydrOXYzine 25 MG tablet  Commonly known as:  ATARAX/VISTARIL     terconazole 0.4 % vaginal cream  Commonly known as:  TERAZOL 7      TAKE these medications        acetaminophen 325 MG tablet  Commonly known as:  TYLENOL  Take 2 tablets (650 mg total) by mouth every 4 (four) hours as needed (for pain scale < 4).     docusate sodium 100 MG capsule  Commonly known as:  COLACE  Take 1 capsule (100 mg total) by mouth 2 (two) times daily as needed.     ibuprofen 600 MG tablet  Commonly known as:  ADVIL,MOTRIN  Take 1 tablet (600 mg total) by mouth every 6 (six) hours.     PRENATAL VITAMINS PLUS 27-1 MG Tabs  Take 1 tablet by mouth daily. Reported on 12/02/2015     ranitidine 150 MG tablet  Commonly known as:  ZANTAC  Take 1 tablet (150 mg total) by mouth 2 (two) times daily.        Diet: routine diet  Activity: Advance as tolerated. Pelvic rest for 6 weeks.   Outpatient follow  up:6 weeks Follow up Appt:No future appointments. Follow up Visit:No Follow-up on file.  Postpartum contraception: Depo Provera   Newborn Data: Live born female  Birth Weight: 7 lb 5.1 oz (3320 g) APGAR: 9, 9  Baby Feeding: Bottle and Breast Disposition:home with mother   03/21/2016 Mercy Riding, MD   OB FELLOW DISCHARGE ATTESTATION  I have seen and examined this patient and agree with above documentation in the resident's note.   Desma Maxim, MD 9:59 PM

## 2016-03-21 NOTE — Discharge Instructions (Signed)

## 2016-04-03 ENCOUNTER — Other Ambulatory Visit (HOSPITAL_COMMUNITY): Payer: Medicaid Other

## 2016-05-15 ENCOUNTER — Other Ambulatory Visit: Payer: Self-pay | Admitting: Student

## 2016-05-17 ENCOUNTER — Ambulatory Visit: Payer: Medicaid Other | Admitting: Obstetrics and Gynecology

## 2016-07-11 ENCOUNTER — Encounter: Payer: Self-pay | Admitting: *Deleted

## 2016-11-06 ENCOUNTER — Encounter (HOSPITAL_COMMUNITY): Payer: Self-pay | Admitting: Emergency Medicine

## 2016-11-06 ENCOUNTER — Emergency Department (HOSPITAL_COMMUNITY)
Admission: EM | Admit: 2016-11-06 | Discharge: 2016-11-06 | Disposition: A | Discharge status: Home / Self Care | Payer: Medicaid Other | Attending: Emergency Medicine | Admitting: Emergency Medicine

## 2016-11-06 DIAGNOSIS — Z79899 Other long term (current) drug therapy: Secondary | ICD-10-CM | POA: Insufficient documentation

## 2016-11-06 DIAGNOSIS — N939 Abnormal uterine and vaginal bleeding, unspecified: Secondary | ICD-10-CM

## 2016-11-06 DIAGNOSIS — Z87891 Personal history of nicotine dependence: Secondary | ICD-10-CM | POA: Insufficient documentation

## 2016-11-06 LAB — BASIC METABOLIC PANEL
ANION GAP: 8 (ref 5–15)
BUN: 8 mg/dL (ref 6–20)
CHLORIDE: 106 mmol/L (ref 101–111)
CO2: 23 mmol/L (ref 22–32)
Calcium: 9.2 mg/dL (ref 8.9–10.3)
Creatinine, Ser: 0.89 mg/dL (ref 0.44–1.00)
GFR calc non Af Amer: >60 mL/min (ref >60)
Glucose, Bld: 98 mg/dL (ref 65–99)
Potassium: 4.2 mmol/L (ref 3.5–5.1)
Sodium: 137 mmol/L (ref 135–145)

## 2016-11-06 LAB — CBC WITH DIFFERENTIAL/PLATELET
BASOS ABS: 0 10*3/uL (ref 0.0–0.1)
BASOS PCT: 1 %
Eosinophils Absolute: 0.1 10*3/uL (ref 0.0–0.7)
Eosinophils Relative: 2 %
HEMATOCRIT: 40.1 % (ref 36.0–46.0)
HEMOGLOBIN: 12.7 g/dL (ref 12.0–15.0)
Lymphocytes Relative: 24 %
Lymphs Abs: 1.2 10*3/uL (ref 0.7–4.0)
MCH: 27 pg (ref 26.0–34.0)
MCHC: 31.7 g/dL (ref 30.0–36.0)
MCV: 85.3 fL (ref 78.0–100.0)
Monocytes Absolute: 0.5 10*3/uL (ref 0.1–1.0)
Monocytes Relative: 9 %
NEUTROS ABS: 3.2 10*3/uL (ref 1.7–7.7)
NEUTROS PCT: 64 %
Platelets: 210 10*3/uL (ref 150–400)
RBC: 4.7 MIL/uL (ref 3.87–5.11)
RDW: 14.1 % (ref 11.5–15.5)
WBC: 5 10*3/uL (ref 4.0–10.5)

## 2016-11-06 LAB — I-STAT BETA HCG BLOOD, ED (MC, WL, AP ONLY)

## 2016-11-06 MED ORDER — NAPROXEN 500 MG PO TABS: 500.0000 mg | ORAL_TABLET | Freq: Two times a day (BID) | ORAL | 0 refills | Status: DC

## 2016-11-06 NOTE — ED Triage Notes (Signed)
Pt sts irregular vaginal bleeding since December; pt sts vaginal bleeding starting last night

## 2016-11-06 NOTE — ED Provider Notes (Signed)
[facility]-EMERGENCY DEPT Provider Note   CSN: [id number] Arrival date & time: 11/06/16  0944   By signing my name below, I, Margaret Cain, attest that this documentation has been prepared under the direction and in the presence of Margaret Porter, MD . Electronically Signed: Freida Cain, Scribe. 11/06/2016. 1:29 PM.  History   Chief Complaint Chief Complaint  Patient presents with  . Vaginal Bleeding    The history is provided by the patient. No language interpreter was used.    HPI Comments:  Margaret Cain is a 31 y.o. female who presents to the Emergency Department complaining of abnormal vaginal bleeding since December 2017. Pt gave birth in June 2017 and states her menstrual periods were regular (the 27th of every month) and lasts 3-5 days. In December she bled from the 6th-8th and again from the 22nd to the 27th. She began bleeding again yesterday. She describes dark blood and states she has been changing her pads 2-3 times a day. Pt reports associated back pain. She has a h/o cervical cancer and has had a colposcopy to remove the cancer.    Past Medical History:  Diagnosis Date  . Anemia   . Anxiety   . Depression    ppd with first child  . Gestational diabetes    diet controlled  . Headache(784.0)   . Hx of chlamydia infection   . Pregnancy induced hypertension   . Urinary tract infection   . Vaginal Pap smear, abnormal    HSIL 06/16/15- Needs colpo    Patient Active Problem List   Diagnosis Date Noted  . SVD (spontaneous vaginal delivery) 03/21/2016  . Labor and delivery indication for care or intervention 03/19/2016  . Polyhydramnios in third trimester, antepartum complication 03/16/2016  . Anemia affecting pregnancy, antepartum 02/17/2016  . Noncompliant pregnant patient, antepartum 02/16/2016  . Pregnancy complicated by fetal cerebral ventriculomegaly 11/04/2015  . Abnormal fetal ultrasound 11/04/2015  . Previous preterm delivery, antepartum 10/21/2015  . HSIL  (high grade squamous intraepithelial lesion) on Pap smear of cervix 10/21/2015  . Supervision of high-risk pregnancy 10/19/2015  . Gestational diabetes mellitus (GDM), antepartum 10/19/2015  . Pica 10/19/2015  . History of preterm delivery 10/19/2015  . Smoker 12/10/2014    Past Surgical History:  Procedure Laterality Date  . COLPOSCOPY      OB History    Gravida Para Term Preterm AB Living   7 6 5 1 1 6    SAB TAB Ectopic Multiple Live Births   1 0 0 0 6       Home Medications    Prior to Admission medications   Medication Sig Start Date End Date Taking? Authorizing Provider  acetaminophen (TYLENOL) 325 MG tablet Take 2 tablets (650 mg total) by mouth every 4 (four) hours as needed (for pain scale < 4). 03/21/16   Almon Hercules, MD  docusate sodium (COLACE) 100 MG capsule Take 1 capsule (100 mg total) by mouth 2 (two) times daily as needed. Patient taking differently: Take 100 mg by mouth 2 (two) times daily as needed for mild constipation.  02/28/16   Federico Flake, MD  ibuprofen (ADVIL,MOTRIN) 600 MG tablet Take 1 tablet (600 mg total) by mouth every 6 (six) hours. 03/21/16   Almon Hercules, MD  naproxen (NAPROSYN) 500 MG tablet Take 1 tablet (500 mg total) by mouth 2 (two) times daily. 11/06/16   Margaret Porter, MD  Prenatal Vit-Fe Fumarate-FA (PRENATAL VITAMINS PLUS) 27-1 MG TABS Take 1 tablet by  mouth daily. Reported on 12/02/2015 02/28/16   Federico FlakeKimberly Niles Newton, MD  ranitidine (ZANTAC) 150 MG tablet Take 1 tablet (150 mg total) by mouth 2 (two) times daily. Patient not taking: Reported on 03/19/2016 01/03/16   Beaulah Dinninghristina M Gambino, MD    Family History Family History  Problem Relation Age of Onset  . Anesthesia problems Neg Hx   . Hypotension Neg Hx   . Malignant hyperthermia Neg Hx   . Pseudochol deficiency Neg Hx   . Other Neg Hx   . Cancer Neg Hx   . Heart disease Neg Hx   . Stroke Neg Hx   . Hearing loss Neg Hx   . Asthma Son   . Asthma Paternal Aunt     Social  History Social History  Substance Use Topics  . Smoking status: Former Smoker    Packs/day: 0.25    Quit date: 09/23/2015  . Smokeless tobacco: Never Used     Comment: quit  prior to preg  . Alcohol use No     Allergies   Patient has no known allergies.   Review of Systems Review of Systems  Constitutional: Negative for appetite change, chills, diaphoresis, fatigue and fever.  HENT: Negative for mouth sores, sore throat and trouble swallowing.   Eyes: Negative for visual disturbance.  Respiratory: Negative for cough, chest tightness, shortness of breath and wheezing.   Cardiovascular: Negative for chest pain.  Gastrointestinal: Negative for abdominal distention, abdominal pain, diarrhea, nausea and vomiting.  Endocrine: Negative for polydipsia, polyphagia and polyuria.  Genitourinary: Positive for vaginal bleeding. Negative for dysuria, frequency and hematuria.  Musculoskeletal: Positive for back pain. Negative for gait problem.  Skin: Negative for color change, pallor and rash.  Neurological: Negative for dizziness, syncope, light-headedness and headaches.  Hematological: Does not bruise/bleed easily.  Psychiatric/Behavioral: Negative for behavioral problems and confusion.   Physical Exam Updated Vital Signs BP 133/75 (BP Location: Right Arm)   Pulse 92   Temp 99.1 F (37.3 C) (Oral)   Resp 18   Ht 5' (1.524 m)   Wt 116 lb (52.6 kg)   SpO2 96%   BMI 22.65 kg/m   Physical Exam  Constitutional: She is oriented to person, place, and time. She appears well-developed and well-nourished. No distress.  HENT:  Head: Normocephalic.  Eyes: Conjunctivae are normal. Pupils are equal, round, and reactive to light. No scleral icterus.  Neck: Normal range of motion. Neck supple. No thyromegaly present.  Cardiovascular: Normal rate and regular rhythm.  Exam reveals no gallop and no friction rub.   No murmur heard. Pulmonary/Chest: Effort normal and breath sounds normal. No  respiratory distress. She has no wheezes. She has no rales.  Abdominal: Soft. Bowel sounds are normal. She exhibits no distension. There is no tenderness. There is no rebound.  Musculoskeletal: Normal range of motion.  Neurological: She is alert and oriented to person, place, and time.  Skin: Skin is warm and dry. No rash noted.  Psychiatric: She has a normal mood and affect. Her behavior is normal.  Nursing note and vitals reviewed.    ED Treatments / Results  DIAGNOSTIC STUDIES:  Oxygen Saturation is 96% on RA, normal by my interpretation.    COORDINATION OF CARE:  1:28 PM Discussed treatment plan with pt at bedside and pt agreed to plan.  Labs (all labs ordered are listed, but only abnormal results are displayed) Labs Reviewed  CBC WITH DIFFERENTIAL/PLATELET  BASIC METABOLIC PANEL  I-STAT BETA HCG BLOOD, ED ([facility],  [facility], [facility] ONLY)    EKG  EKG Interpretation None       Radiology No results found.  Procedures Procedures (including critical care time)  Medications Ordered in ED Medications - No data to display   Initial Impression / Assessment and Plan / ED Course  I have reviewed the triage vital signs and the nursing notes.  Pertinent labs & imaging results that were available during my care of the patient were reviewed by me and considered in my medical decision making (see chart for details).     Not pregnant. Not anemic. Stressed OB follow-up and possibility of hormone treatment as needed. Currently no indications for emergent referral or consultation or intervention  Final Clinical Impressions(s) / ED Diagnoses   Final diagnoses:  Abnormal uterine bleeding (AUB)    New Prescriptions Discharge Medication List as of 11/06/2016  1:33 PM    START taking these medications   Details  naproxen (NAPROSYN) 500 MG tablet Take 1 tablet (500 mg total) by mouth 2 (two) times daily., Starting Mon 11/06/2016, Print       I personally performed the services described  in this documentation, which was scribed in my presence. The recorded information has been reviewed and is accurate.     Margaret Porter, MD 11/16/16 [phone]

## 2017-01-08 ENCOUNTER — Encounter (HOSPITAL_COMMUNITY): Payer: Self-pay

## 2017-01-08 ENCOUNTER — Emergency Department (HOSPITAL_COMMUNITY): Payer: Self-pay

## 2017-01-08 ENCOUNTER — Emergency Department (HOSPITAL_COMMUNITY)
Admission: EM | Admit: 2017-01-08 | Discharge: 2017-01-08 | Disposition: A | Discharge status: Home / Self Care | Payer: Self-pay | Attending: Emergency Medicine | Admitting: Emergency Medicine

## 2017-01-08 DIAGNOSIS — Z79899 Other long term (current) drug therapy: Secondary | ICD-10-CM | POA: Insufficient documentation

## 2017-01-08 DIAGNOSIS — R1033 Periumbilical pain: Secondary | ICD-10-CM | POA: Insufficient documentation

## 2017-01-08 DIAGNOSIS — Z87891 Personal history of nicotine dependence: Secondary | ICD-10-CM | POA: Insufficient documentation

## 2017-01-08 LAB — WET PREP, GENITAL
Sperm: NONE SEEN
TRICH WET PREP: NONE SEEN
YEAST WET PREP: NONE SEEN

## 2017-01-08 LAB — CBC
HEMATOCRIT: 37.7 % (ref 36.0–46.0)
Hemoglobin: 11.8 g/dL — ABNORMAL LOW (ref 12.0–15.0)
MCH: 26.6 pg (ref 26.0–34.0)
MCHC: 31.3 g/dL (ref 30.0–36.0)
MCV: 84.9 fL (ref 78.0–100.0)
Platelets: 290 10*3/uL (ref 150–400)
RBC: 4.44 MIL/uL (ref 3.87–5.11)
RDW: 13.8 % (ref 11.5–15.5)
WBC: 6.6 10*3/uL (ref 4.0–10.5)

## 2017-01-08 LAB — URINALYSIS, ROUTINE W REFLEX MICROSCOPIC
BACTERIA UA: NONE SEEN
Bilirubin Urine: NEGATIVE
Glucose, UA: NEGATIVE mg/dL
Hgb urine dipstick: NEGATIVE
KETONES UR: NEGATIVE mg/dL
Nitrite: NEGATIVE
Protein, ur: NEGATIVE mg/dL
Specific Gravity, Urine: 1.008 (ref 1.005–1.030)
pH: 5 (ref 5.0–8.0)

## 2017-01-08 LAB — COMPREHENSIVE METABOLIC PANEL
ALBUMIN: 3.9 g/dL (ref 3.5–5.0)
ALT: 12 U/L — AB (ref 14–54)
AST: 16 U/L (ref 15–41)
Alkaline Phosphatase: 54 U/L (ref 38–126)
Anion gap: 8 (ref 5–15)
BUN: 6 mg/dL (ref 6–20)
CALCIUM: 9.1 mg/dL (ref 8.9–10.3)
CO2: 26 mmol/L (ref 22–32)
CREATININE: 0.65 mg/dL (ref 0.44–1.00)
Chloride: 104 mmol/L (ref 101–111)
GFR calc Af Amer: >60 mL/min (ref >60)
GFR calc non Af Amer: >60 mL/min (ref >60)
GLUCOSE: 103 mg/dL — AB (ref 65–99)
Potassium: 3.9 mmol/L (ref 3.5–5.1)
SODIUM: 138 mmol/L (ref 135–145)
Total Bilirubin: 0.6 mg/dL (ref 0.3–1.2)
Total Protein: 7.3 g/dL (ref 6.5–8.1)

## 2017-01-08 LAB — LIPASE, BLOOD: Lipase: 19 U/L (ref 11–51)

## 2017-01-08 LAB — CBG MONITORING, ED: Glucose-Capillary: 95 mg/dL (ref 65–99)

## 2017-01-08 LAB — POC URINE PREG, ED: PREG TEST UR: NEGATIVE

## 2017-01-08 LAB — GC/CHLAMYDIA PROBE AMP (~~LOC~~) NOT AT ARMC
Chlamydia: POSITIVE — AB
Neisseria Gonorrhea: NEGATIVE

## 2017-01-08 MED ORDER — CEFTRIAXONE SODIUM 250 MG IJ SOLR
250.0000 mg | Freq: Once | INTRAMUSCULAR | Status: AC
  Administered 2017-01-08: 250 mg via INTRAMUSCULAR
  Filled 2017-01-08: qty 250

## 2017-01-08 MED ORDER — FENTANYL CITRATE (PF) 100 MCG/2ML IJ SOLN
50.0000 ug | Freq: Once | INTRAMUSCULAR | Status: AC
  Administered 2017-01-08: 50 ug via INTRAVENOUS
  Filled 2017-01-08: qty 2

## 2017-01-08 MED ORDER — IOPAMIDOL (ISOVUE-300) INJECTION 61%
INTRAVENOUS | Status: AC
  Filled 2017-01-08: qty 100

## 2017-01-08 MED ORDER — ONDANSETRON 4 MG PO TBDP: 4.0000 mg | ORAL_TABLET | Freq: Three times a day (TID) | ORAL | 0 refills | Status: DC | PRN

## 2017-01-08 MED ORDER — LIDOCAINE HCL (PF) 1 % IJ SOLN
INTRAMUSCULAR | Status: AC
  Administered 2017-01-08: 5 mL
  Filled 2017-01-08: qty 5

## 2017-01-08 MED ORDER — DOXYCYCLINE HYCLATE 100 MG PO CAPS: 100.0000 mg | ORAL_CAPSULE | Freq: Two times a day (BID) | ORAL | 0 refills | Status: AC

## 2017-01-08 MED ORDER — IOPAMIDOL (ISOVUE-300) INJECTION 61%
INTRAVENOUS | Status: AC
  Administered 2017-01-08: 100 mL
  Filled 2017-01-08: qty 50

## 2017-01-08 NOTE — ED Provider Notes (Signed)
[facility]-EMERGENCY DEPT Provider Note   CSN: [id number] Arrival date & time: 01/08/17  0120     History   Chief Complaint Chief Complaint  Patient presents with  . Abdominal Pain    HPI Meda Cline CoolsM Dirr is a 31 y.o. female [phone] with a hx of anemia, depression, gestational diabetes, HA presents to the Emergency Department complaining of gradual, persistent, progressively worsening lower and periumbilical abd pain onset 2 days ago. Associated symptoms include nausea without vomiting.  Nothing makes it better and nothing makes it worse.  Pt denies fever, chills, CP, SOB, syncope, vaginal discharge.  LMP 01/01/17.  Pt is sexually active with 1 female.  No contraceptive usage.  Pt is attempting to get pregnant.       The history is provided by the patient and medical records. No language interpreter was used.    Past Medical History:  Diagnosis Date  . Anemia   . Anxiety   . Depression    ppd with first child  . Gestational diabetes    diet controlled  . Headache(784.0)   . Hx of chlamydia infection   . Pregnancy induced hypertension   . Urinary tract infection   . Vaginal Pap smear, abnormal    HSIL 06/16/15- Needs colpo    Patient Active Problem List   Diagnosis Date Noted  . SVD (spontaneous vaginal delivery) 03/21/2016  . Labor and delivery indication for care or intervention 03/19/2016  . Polyhydramnios in third trimester, antepartum complication 03/16/2016  . Anemia affecting pregnancy, antepartum 02/17/2016  . Noncompliant pregnant patient, antepartum 02/16/2016  . Pregnancy complicated by fetal cerebral ventriculomegaly 11/04/2015  . Abnormal fetal ultrasound 11/04/2015  . Previous preterm delivery, antepartum 10/21/2015  . HSIL (high grade squamous intraepithelial lesion) on Pap smear of cervix 10/21/2015  . Supervision of high-risk pregnancy 10/19/2015  . Gestational diabetes mellitus (GDM), antepartum 10/19/2015  . Pica 10/19/2015  . History of preterm delivery  10/19/2015  . Smoker 12/10/2014    Past Surgical History:  Procedure Laterality Date  . COLPOSCOPY      OB History    Gravida Para Term Preterm AB Living   7 6 5 1 1 6    SAB TAB Ectopic Multiple Live Births   1 0 0 0 6       Home Medications    Prior to Admission medications   Medication Sig Start Date End Date Taking? Authorizing Provider  acetaminophen (TYLENOL) 325 MG tablet Take 2 tablets (650 mg total) by mouth every 4 (four) hours as needed (for pain scale < 4). Patient not taking: Reported on 01/08/2017 03/21/16   Almon Herculesaye T Gonfa, MD  docusate sodium (COLACE) 100 MG capsule Take 1 capsule (100 mg total) by mouth 2 (two) times daily as needed. Patient not taking: Reported on 01/08/2017 02/28/16   Federico FlakeKimberly Niles Newton, MD  doxycycline (VIBRAMYCIN) 100 MG capsule Take 1 capsule (100 mg total) by mouth 2 (two) times daily. 01/08/17 01/22/17  Barrett HenleNicole Elizabeth Nadeau, PA-C  ibuprofen (ADVIL,MOTRIN) 600 MG tablet Take 1 tablet (600 mg total) by mouth every 6 (six) hours. Patient not taking: Reported on 01/08/2017 03/21/16   Almon Herculesaye T Gonfa, MD  naproxen (NAPROSYN) 500 MG tablet Take 1 tablet (500 mg total) by mouth 2 (two) times daily. Patient not taking: Reported on 01/08/2017 11/06/16   Rolland PorterMark James, MD  ondansetron (ZOFRAN ODT) 4 MG disintegrating tablet Take 1 tablet (4 mg total) by mouth every 8 (eight) hours as needed for nausea or vomiting. 01/08/17  Barrett Henle, PA-C  Prenatal Vit-Fe Fumarate-FA (PRENATAL VITAMINS PLUS) 27-1 MG TABS Take 1 tablet by mouth daily. Reported on 12/02/2015 Patient not taking: Reported on 01/08/2017 02/28/16   Federico Flake, MD  ranitidine (ZANTAC) 150 MG tablet Take 1 tablet (150 mg total) by mouth 2 (two) times daily. Patient not taking: Reported on 03/19/2016 01/03/16   Beaulah Dinning, MD    Family History Family History  Problem Relation Age of Onset  . Asthma Son   . Asthma Paternal Aunt   . Anesthesia problems Neg Hx   . Hypotension  Neg Hx   . Malignant hyperthermia Neg Hx   . Pseudochol deficiency Neg Hx   . Other Neg Hx   . Cancer Neg Hx   . Heart disease Neg Hx   . Stroke Neg Hx   . Hearing loss Neg Hx     Social History Social History  Substance Use Topics  . Smoking status: Former Smoker    Packs/day: 0.25    Quit date: 09/23/2015  . Smokeless tobacco: Never Used     Comment: quit  prior to preg  . Alcohol use No     Allergies   Patient has no known allergies.   Review of Systems Review of Systems  Gastrointestinal: Positive for abdominal pain.  All other systems reviewed and are negative.    Physical Exam Updated Vital Signs BP 98/73 (BP Location: Right Arm)   Pulse 77   Temp 97.6 F (36.4 C) (Oral)   Resp 18   LMP 01/01/2017 (Approximate)   SpO2 99%   Physical Exam  Constitutional: She appears well-developed and well-nourished. No distress.  Awake, alert, nontoxic appearance  HENT:  Head: Normocephalic and atraumatic.  Mouth/Throat: Oropharynx is clear and moist. No oropharyngeal exudate.  Eyes: Conjunctivae are normal. No scleral icterus.  Neck: Normal range of motion. Neck supple.  Cardiovascular: Normal rate, regular rhythm, normal heart sounds and intact distal pulses.   No murmur heard. Pulmonary/Chest: Effort normal and breath sounds normal. No respiratory distress. She has no wheezes.  Equal chest expansion  Abdominal: Soft. Bowel sounds are normal. She exhibits no mass. There is tenderness in the periumbilical area. There is no rigidity, no rebound, no guarding and no CVA tenderness. Hernia confirmed negative in the right inguinal area and confirmed negative in the left inguinal area.  Genitourinary: Uterus normal. No labial fusion. There is no rash, tenderness or lesion on the right labia. There is no rash, tenderness or lesion on the left labia. Uterus is not deviated, not enlarged, not fixed and not tender. Cervix exhibits no motion tenderness, no discharge and no  friability. Right adnexum displays no mass, no tenderness and no fullness. Left adnexum displays no mass, no tenderness and no fullness. No erythema, tenderness or bleeding in the vagina. No foreign body in the vagina. No signs of injury around the vagina. No vaginal discharge found.  Musculoskeletal: Normal range of motion. She exhibits no edema.  Lymphadenopathy:       Right: No inguinal adenopathy present.       Left: No inguinal adenopathy present.  Neurological: She is alert.  Speech is clear and goal oriented Moves extremities without ataxia  Skin: Skin is warm and dry. She is not diaphoretic. No erythema.  Psychiatric: She has a normal mood and affect.  Nursing note and vitals reviewed.    ED Treatments / Results  Labs (all labs ordered are listed, but only abnormal results are displayed)  Labs Reviewed  WET PREP, GENITAL - Abnormal; Notable for the following:       Result Value   Clue Cells Wet Prep HPF POC PRESENT (*)    WBC, Wet Prep HPF POC MODERATE (*)    All other components within normal limits  COMPREHENSIVE METABOLIC PANEL - Abnormal; Notable for the following:    Glucose, Bld 103 (*)    ALT 12 (*)    All other components within normal limits  CBC - Abnormal; Notable for the following:    Hemoglobin 11.8 (*)    All other components within normal limits  URINALYSIS, ROUTINE W REFLEX MICROSCOPIC - Abnormal; Notable for the following:    Color, Urine STRAW (*)    Leukocytes, UA MODERATE (*)    Squamous Epithelial / LPF 0-5 (*)    All other components within normal limits  LIPASE, BLOOD  POC URINE PREG, ED  CBG MONITORING, ED  GC/CHLAMYDIA PROBE AMP ([place]) NOT AT [facility]   Procedures Procedures (including critical care time)  Medications Ordered in ED Medications  iopamidol (ISOVUE-300) 61 % injection (not administered)  cefTRIAXone (ROCEPHIN) injection 250 mg (not administered)  fentaNYL (SUBLIMAZE) injection 50 mcg (50 mcg Intravenous Given 01/08/17  0347)  iopamidol (ISOVUE-300) 61 % injection (100 mLs  Contrast Given 01/08/17 0615)     Initial Impression / Assessment and Plan / ED Course  I have reviewed the triage vital signs and the nursing notes.  Pertinent labs & imaging results that were available during my care of the patient were reviewed by me and considered in my medical decision making (see chart for details).     Patient presents with periumbilical abdominal pain onset several days ago and acutely worsening last night. Labs are reassuring.  Exam without cervical motion tenderness. No vaginal discharge. No foul odor. CT scan pending for further evaluation of her persistent abdominal pain. Patient has been given fluids and pain control.  06:30AM CT scan pending.  Care transferred to Melburn Hake, PA-C who will follow imaging, reassess and disposition appropriately.    Final Clinical Impressions(s) / ED Diagnoses   Final diagnoses:  Periumbilical abdominal pain    New Prescriptions New Prescriptions   DOXYCYCLINE (VIBRAMYCIN) 100 MG CAPSULE    Take 1 capsule (100 mg total) by mouth 2 (two) times daily.   ONDANSETRON (ZOFRAN ODT) 4 MG DISINTEGRATING TABLET    Take 1 tablet (4 mg total) by mouth every 8 (eight) hours as needed for nausea or vomiting.     Dahlia Client Kenijah Benningfield, PA-C 01/08/17 1610    Charlynne Pander, MD 01/11/17 [phone]

## 2017-01-08 NOTE — Discharge Instructions (Signed)
Take your medications as prescribed until completed. Continue drinking fluids to remain hydrated at home.  Follow up with your primary care provider in 4-5 days as needed.  Please return to the Emergency Department if symptoms worsen or new onset of fever, chest pain, difficulty breathing, new/worsening abdominal pain, vomiting, unable to keep fluids down, vaginal bleeding/discharge.

## 2017-01-08 NOTE — ED Notes (Signed)
Oral contrast given to pt.

## 2017-01-08 NOTE — ED Provider Notes (Signed)
Hand-off form TXU CorpHannah Muthersbaugh, PA-C. Pending CT abdomen.   See initial provider's note for full HPI.  Briefly pt is a 31 yo female [phone] with PMH of anemia, depression, gestational DM who presents to the ED with complaint of periumbilical abdominal pain, onset 2 days. Endorses associated nausea. Denies fever, chills, CP, SOB, vaginal discharge.   Physical Exam  BP 112/72   Pulse (!) 59   Temp 97.6 F (36.4 C) (Oral)   Resp 18   LMP 01/01/2017 (Approximate)   SpO2 100%   Physical Exam  Constitutional: She is oriented to person, place, and time. She appears well-developed and well-nourished. No distress.  HENT:  Head: Normocephalic and atraumatic.  Eyes: Conjunctivae and EOM are normal. Right eye exhibits no discharge. Left eye exhibits no discharge. No scleral icterus.  Neck: Normal range of motion. Neck supple.  Cardiovascular: Normal rate, regular rhythm, normal heart sounds and intact distal pulses.   Pulmonary/Chest: Effort normal and breath sounds normal. No respiratory distress. She has no wheezes. She has no rales. She exhibits no tenderness.  Abdominal: Soft. Bowel sounds are normal. She exhibits no distension and no mass. There is tenderness. There is no rebound and no guarding. No hernia.  Mild TTP over periumbilical region. No CVA tenderness.  Musculoskeletal: She exhibits no edema.  Neurological: She is alert and oriented to person, place, and time.  Skin: Skin is warm and dry. She is not diaphoretic.  Nursing note and vitals reviewed.   ED Course  Procedures  MDM Pt presents with periumbilical abdominal pain and nausea for 2 days. VSS. Exam performed by initial provider revealed TTP over periumbilical region, no peritoneal signs. Pelvic exam unremarkable, no vaginal discharge. Pregnancy negative. Wet prep positive for clue cells and WBCs. Remaining labs and UA unremarkable. CT abdomen ordered for further evaluation due to pt continuing to report abdominal pain.  CT abdomen pending.   CT abdomen showed equivocal induration of pelvic fat, possibly from PID, otherwise unremarkable. On my initial evaluation pt sitting resting comfortably in bed. Mild TTP over periumbilical region, no peritoneal signs. Plan to tx pt for suspected PID. Discussed results and plan for d/c with pt. Pt d/c home with doxycyline and zofran. Advised to follow up with PCP. Discussed return precautions.        Satira Sarkicole Elizabeth La BocaNadeau, [place] JerseyPA-C 01/08/17 [id number]    Charlynne Panderavid Hsienta Yao, MD 01/11/17 [phone]

## 2017-01-08 NOTE — ED Triage Notes (Signed)
Pt states lower abdominal pain. Pt denies any urinary symptoms. Pt states some abdominal cramping. Pt denies any N/V/D or fevers.

## 2017-02-04 ENCOUNTER — Encounter (HOSPITAL_COMMUNITY): Payer: Self-pay | Admitting: *Deleted

## 2017-02-04 ENCOUNTER — Inpatient Hospital Stay (HOSPITAL_COMMUNITY)
Admission: AD | Admit: 2017-02-04 | Discharge: 2017-02-04 | Disposition: A | Discharge status: Home / Self Care | Payer: Self-pay | Source: Ambulatory Visit | Attending: Obstetrics & Gynecology | Admitting: Obstetrics & Gynecology

## 2017-02-04 DIAGNOSIS — N912 Amenorrhea, unspecified: Secondary | ICD-10-CM | POA: Insufficient documentation

## 2017-02-04 NOTE — [facility] Provider Note (Signed)
  S:   31 y.o. [id number]  by LMP presents to [facility] for pregnancy confirmation.  She denies abdominal pain or vaginal bleeding today.    O: BP 114/72 (BP Location: Left Arm)   Pulse 63   Temp 98.1 F (36.7 C) (Oral)   Resp 16   Wt 93 lb (42.2 kg)   LMP 01/23/2017   SpO2 100%   BMI 18.16 kg/m  Physical Examination: General appearance - alert, well appearing, and in no distress, oriented to person, place, and time and acyanotic, in no respiratory distress  No results found for this or any previous visit (from the past 48 hour(s)).  A: Missed menses  P: D/C home Informed pt we do not do pregnancy verifications in [facility] Referred to [facility] for pregnancy test & verification Return to [facility] as needed for pregnancy related emergencies  Judeth Horn, NP 1:35 PM

## 2017-02-04 NOTE — [facility] Note (Addendum)
Been having preg symptoms: gassy, breast have been irritated, having nipple d/c; tired, nausea, mood swings, emotional, diarrhea the past wk, (2 x a day)l. Period has been abnormal.  4 neg HPTs, took them 2 wks ago.

## 2017-02-06 ENCOUNTER — Encounter (HOSPITAL_COMMUNITY): Payer: Self-pay | Admitting: Emergency Medicine

## 2017-02-06 ENCOUNTER — Emergency Department (HOSPITAL_COMMUNITY)
Admission: EM | Admit: 2017-02-06 | Discharge: 2017-02-06 | Disposition: A | Discharge status: Home / Self Care | Payer: Self-pay | Attending: Emergency Medicine | Admitting: Emergency Medicine

## 2017-02-06 DIAGNOSIS — O926 Galactorrhea: Secondary | ICD-10-CM | POA: Insufficient documentation

## 2017-02-06 DIAGNOSIS — N643 Galactorrhea not associated with childbirth: Secondary | ICD-10-CM

## 2017-02-06 DIAGNOSIS — J029 Acute pharyngitis, unspecified: Secondary | ICD-10-CM | POA: Insufficient documentation

## 2017-02-06 DIAGNOSIS — Z87891 Personal history of nicotine dependence: Secondary | ICD-10-CM | POA: Insufficient documentation

## 2017-02-06 LAB — I-STAT BETA HCG BLOOD, ED (MC, WL, AP ONLY): I-stat hCG, quantitative: <5 m[IU]/mL (ref <5)

## 2017-02-06 LAB — URINALYSIS, ROUTINE W REFLEX MICROSCOPIC
Bilirubin Urine: NEGATIVE
GLUCOSE, UA: NEGATIVE mg/dL
HGB URINE DIPSTICK: NEGATIVE
Ketones, ur: NEGATIVE mg/dL
LEUKOCYTES UA: NEGATIVE
Nitrite: NEGATIVE
PH: 7 (ref 5.0–8.0)
PROTEIN: NEGATIVE mg/dL
Specific Gravity, Urine: 1.023 (ref 1.005–1.030)

## 2017-02-06 LAB — CBC WITH DIFFERENTIAL/PLATELET
BASOS PCT: 1 %
Basophils Absolute: 0 10*3/uL (ref 0.0–0.1)
EOS ABS: 0.2 10*3/uL (ref 0.0–0.7)
Eosinophils Relative: 4 %
HEMATOCRIT: 40 % (ref 36.0–46.0)
Hemoglobin: 12.7 g/dL (ref 12.0–15.0)
LYMPHS ABS: 2.6 10*3/uL (ref 0.7–4.0)
Lymphocytes Relative: 45 %
MCH: 26.9 pg (ref 26.0–34.0)
MCHC: 31.8 g/dL (ref 30.0–36.0)
MCV: 84.7 fL (ref 78.0–100.0)
MONO ABS: 0.4 10*3/uL (ref 0.1–1.0)
MONOS PCT: 7 %
Neutro Abs: 2.5 10*3/uL (ref 1.7–7.7)
Neutrophils Relative %: 43 %
Platelets: 273 10*3/uL (ref 150–400)
RBC: 4.72 MIL/uL (ref 3.87–5.11)
RDW: 13.7 % (ref 11.5–15.5)
WBC: 5.7 10*3/uL (ref 4.0–10.5)

## 2017-02-06 LAB — BASIC METABOLIC PANEL
Anion gap: 5 (ref 5–15)
BUN: 6 mg/dL (ref 6–20)
CALCIUM: 9.3 mg/dL (ref 8.9–10.3)
CO2: 28 mmol/L (ref 22–32)
CREATININE: 0.66 mg/dL (ref 0.44–1.00)
Chloride: 104 mmol/L (ref 101–111)
GFR calc non Af Amer: >60 mL/min (ref >60)
Glucose, Bld: 87 mg/dL (ref 65–99)
Potassium: 3.9 mmol/L (ref 3.5–5.1)
Sodium: 137 mmol/L (ref 135–145)

## 2017-02-06 LAB — RAPID STREP SCREEN (MED CTR MEBANE ONLY): Streptococcus, Group A Screen (Direct): NEGATIVE

## 2017-02-06 MED ORDER — ACETAMINOPHEN 325 MG PO TABS
650.0000 mg | ORAL_TABLET | Freq: Once | ORAL | Status: AC
  Administered 2017-02-06: 650 mg via ORAL
  Filled 2017-02-06: qty 2

## 2017-02-06 MED ORDER — KETOROLAC TROMETHAMINE 60 MG/2ML IM SOLN
30.0000 mg | Freq: Once | INTRAMUSCULAR | Status: AC
  Administered 2017-02-06: 30 mg via INTRAMUSCULAR
  Filled 2017-02-06: qty 2

## 2017-02-06 NOTE — ED Notes (Signed)
Pt given additional sandwich and ginger ale

## 2017-02-06 NOTE — ED Triage Notes (Signed)
Pt c/o of throat pain and headache. Pt also has dental pain on the bottom on her mouth.

## 2017-02-06 NOTE — ED Provider Notes (Signed)
[facility] Provider Note   CSN: [id number] Arrival date & time: 02/06/17  1501  By signing my name below, I, Teofilo Pod, attest that this documentation has been prepared under the direction and in the presence of Kerrie Buffalo, NP. Electronically Signed: Teofilo Pod, ED Scribe. 02/06/2017. 5:40 PM.    History   Chief Complaint Chief Complaint  Patient presents with  . Sore Throat    The history is provided by the patient. No language interpreter was used.  Sore Throat  This is a new problem. The current episode started 6 to 12 hours ago. The problem occurs constantly. The problem has not changed since onset.Associated symptoms include chest pain (breast) and headaches. Pertinent negatives include no abdominal pain. Nothing aggravates the symptoms. Nothing relieves the symptoms. She has tried nothing for the symptoms.   HPI Comments:  Margaret Cain is a 31 y.o. female who presents to the Emergency Department complaining of constant sore throat that began today.  Pt complains of associated headache, breast pain, and urinary frequency. She states that she has had breast pain for 2 weeks. She gave birth 10 months ago and did not breastfeed, and states that she is currently lactating. Pt also complains of associated lower dental pain that began 2 days ago. Pt took 4 home pregnancy tests and all were negative. She was seen at [facility] 2 days ago where she was unable to get a pregnancy test.  Pt is [id number], and LNMP was 01/23/2017 with dark, heavy bleeding for 3 days. She denies any concern for STD. Pt has taken tylenol with mild relief. Denies fever, chills, abdominal pain, dizziness, weakness, dysuria, vaginal discharge.   Past Medical History:  Diagnosis Date  . Anemia   . Anxiety   . Depression    ppd with first child  . Gestational diabetes    diet controlled  . Headache(784.0)   . Hx of chlamydia infection   . Pregnancy induced hypertension   . Urinary  tract infection   . Vaginal Pap smear, abnormal    HSIL 06/16/15- Needs colpo    Patient Active Problem List   Diagnosis Date Noted  . SVD (spontaneous vaginal delivery) 03/21/2016  . Labor and delivery indication for care or intervention 03/19/2016  . Polyhydramnios in third trimester, antepartum complication 03/16/2016  . Anemia affecting pregnancy, antepartum 02/17/2016  . Noncompliant pregnant patient, antepartum 02/16/2016  . Pregnancy complicated by fetal cerebral ventriculomegaly 11/04/2015  . Abnormal fetal ultrasound 11/04/2015  . Previous preterm delivery, antepartum 10/21/2015  . HSIL (high grade squamous intraepithelial lesion) on Pap smear of cervix 10/21/2015  . Supervision of high-risk pregnancy 10/19/2015  . Gestational diabetes mellitus (GDM), antepartum 10/19/2015  . Pica 10/19/2015  . History of preterm delivery 10/19/2015  . Smoker 12/10/2014    Past Surgical History:  Procedure Laterality Date  . COLPOSCOPY      OB History    Gravida Para Term Preterm AB Living   SAB TAB Ectopic Multiple Live Births   1 0 0 0 6       Home Medications    Prior to Admission medications   Medication Sig Start Date End Date Taking? Authorizing Provider  acetaminophen (TYLENOL) 325 MG tablet Take 2 tablets (650 mg total) by mouth every 4 (four) hours as needed (for pain scale < 4). Patient not taking: Reported on 01/08/2017 03/21/16   Almon Hercules, MD  docusate sodium (COLACE) 100  MG capsule Take 1 capsule (100 mg total) by mouth 2 (two) times daily as needed. Patient not taking: Reported on 01/08/2017 02/28/16   Federico Flake, MD  ibuprofen (ADVIL,MOTRIN) 600 MG tablet Take 1 tablet (600 mg total) by mouth every 6 (six) hours. Patient not taking: Reported on 01/08/2017 03/21/16   Almon Hercules, MD  naproxen (NAPROSYN) 500 MG tablet Take 1 tablet (500 mg total) by mouth 2 (two) times daily. Patient not taking: Reported on 01/08/2017 11/06/16   Rolland Porter, MD    ondansetron (ZOFRAN ODT) 4 MG disintegrating tablet Take 1 tablet (4 mg total) by mouth every 8 (eight) hours as needed for nausea or vomiting. 01/08/17   Barrett Henle, PA-C  Prenatal Vit-Fe Fumarate-FA (PRENATAL VITAMINS PLUS) 27-1 MG TABS Take 1 tablet by mouth daily. Reported on 12/02/2015 Patient not taking: Reported on 01/08/2017 02/28/16   Federico Flake, MD  ranitidine (ZANTAC) 150 MG tablet Take 1 tablet (150 mg total) by mouth 2 (two) times daily. Patient not taking: Reported on 03/19/2016 01/03/16   Beaulah Dinning, MD    Family History Family History  Problem Relation Age of Onset  . Asthma Son   . Asthma Paternal Aunt   . Anesthesia problems Neg Hx   . Hypotension Neg Hx   . Malignant hyperthermia Neg Hx   . Pseudochol deficiency Neg Hx   . Other Neg Hx   . Cancer Neg Hx   . Heart disease Neg Hx   . Stroke Neg Hx   . Hearing loss Neg Hx     Social History Social History  Substance Use Topics  . Smoking status: Former Smoker    Packs/day: 0.25    Quit date: 09/23/2015  . Smokeless tobacco: Never Used     Comment: quit  prior to preg  . Alcohol use No     Allergies   Patient has no known allergies.   Review of Systems Review of Systems  Constitutional: Negative for chills and fever.  HENT: Positive for dental problem and sore throat. Negative for ear pain.   Cardiovascular: Positive for chest pain (breast).  Gastrointestinal: Negative for abdominal pain.  Genitourinary: Positive for frequency. Negative for dysuria, vaginal bleeding and vaginal discharge.  Neurological: Positive for headaches. Negative for dizziness and weakness.     Physical Exam Updated Vital Signs BP 101/63   Pulse 63   Temp 98.4 F (36.9 C) (Oral)   Resp 16   LMP 01/23/2017   SpO2 100%   Physical Exam  Constitutional: She appears well-developed and well-nourished. No distress.  HENT:  Head: Normocephalic and atraumatic.  Uvula midlines, no edema or erythema.  TMs not visible due to cerumen. Frontal sinus tenderness. No TMJ tenderness. Normal dentition.   Eyes: Conjunctivae and EOM are normal. Pupils are equal, round, and reactive to light.  Cardiovascular: Normal rate, regular rhythm and normal heart sounds.   Pulmonary/Chest: Effort normal and breath sounds normal. She has no wheezes. She has no rales.  No right or left axillary nodes. No right or left breast mass palpated. White milk discharge from right and left nipple. No breast tenderness noted.  Abdominal: She exhibits no distension.  No CVA tenderness.  Lymphadenopathy:    She has no cervical adenopathy.  Neurological: She is alert.  Skin: Skin is warm and dry.  Psychiatric: She has a normal mood and affect.  Nursing note and vitals reviewed.    ED Treatments / Results  DIAGNOSTIC STUDIES:  Oxygen Saturation is 96% on RA, normal by my interpretation.    COORDINATION OF CARE:  5:36 PM Discussed treatment plan with pt at bedside and pt agreed to plan.   Labs (all labs ordered are listed, but only abnormal results are displayed) Labs Reviewed  URINALYSIS, ROUTINE W REFLEX MICROSCOPIC - Abnormal; Notable for the following:       Result Value   APPearance HAZY (*)    All other components within normal limits  RAPID STREP SCREEN (NOT AT [facility])  CULTURE, GROUP A STREP ([facility])  CBC WITH DIFFERENTIAL/PLATELET  BASIC METABOLIC PANEL  I-STAT BETA HCG BLOOD, ED ([facility], [facility], [facility] ONLY)   Radiology No results found.  Procedures Procedures (including critical care time)  Medications Ordered in ED Medications  acetaminophen (TYLENOL) tablet 650 mg (650 mg Oral Given 02/06/17 1856)  ketorolac (TORADOL) injection 30 mg (30 mg Intramuscular Given 02/06/17 1857)     Initial Impression / Assessment and Plan / ED Course  I have reviewed the triage vital signs and the nursing notes.  Pertinent lab results that were available during my care of the patient were reviewed by me and considered in  my medical decision making (see chart for details).   Final Clinical Impressions(s) / ED Diagnoses  31 y.o. female with sore throat and viral symptoms and galactorrhea stable for d/c without fever and does not appear toxic. Will treat symptoms of sore throat and she will f/u with her GYN for the breast issues.   Final diagnoses:  Viral pharyngitis  Galactorrhea in female    New Prescriptions Discharge Medication List as of 02/06/2017  8:01 PM    I personally performed the services described in this documentation, which was scribed in my presence. The recorded information has been reviewed and is accurate.     [address] [place], [place] 02/07/17 2135    Rolan Bucco, MD 02/09/17 [phone]

## 2017-02-09 LAB — CULTURE, GROUP A STREP (THRC)

## 2017-02-22 DIAGNOSIS — Z87891 Personal history of nicotine dependence: Secondary | ICD-10-CM | POA: Insufficient documentation

## 2017-02-22 DIAGNOSIS — Z79899 Other long term (current) drug therapy: Secondary | ICD-10-CM | POA: Diagnosis not present

## 2017-02-22 DIAGNOSIS — N644 Mastodynia: Secondary | ICD-10-CM | POA: Insufficient documentation

## 2017-02-23 ENCOUNTER — Emergency Department (HOSPITAL_COMMUNITY)
Admission: EM | Admit: 2017-02-23 | Discharge: 2017-02-23 | Disposition: A | Discharge status: Home / Self Care | Payer: Medicaid Other | Attending: Emergency Medicine | Admitting: Emergency Medicine

## 2017-02-23 ENCOUNTER — Encounter (HOSPITAL_COMMUNITY): Payer: Self-pay | Admitting: Emergency Medicine

## 2017-02-23 DIAGNOSIS — N644 Mastodynia: Secondary | ICD-10-CM

## 2017-02-23 NOTE — Discharge Instructions (Signed)
Alternate between tylenol and motrin to help with pain. Use a warm compress to the areas of pain, no more than 20 minutes per hour. Gently massage the breast to help unclog any milk ducts that may be clogged. Follow up with your regular doctor or OBGYN in 1 week for recheck of symptoms. [facility] for changes or worsening symptoms.

## 2017-02-23 NOTE — ED Triage Notes (Signed)
Reports sharp pain in left nipple that started today.  No other symptoms.  Has 112 month old and is lactating but does not breastfeed.

## 2017-02-23 NOTE — ED Notes (Signed)
Pt pressed the call bell and stated "my titty hurts."

## 2017-02-23 NOTE — ED Notes (Signed)
RN entered room to introduce self to patient after receiving shift change report, found the room empty; RN also called in the lobby for patient, no answer; restrooms empty also; discharge instructions found in MD office and left with RN 1st

## 2017-02-23 NOTE — ED Provider Notes (Signed)
[facility] Provider Note   CSN: [id number] Arrival date & time: 02/22/17  2356     History   Chief Complaint Chief Complaint  Patient presents with  . Breast Problem    HPI Margaret Cain is a 31 y.o. female with a PMHx of anemia, gestational diabetes, pregnancy induced HTN, depression, anxiety, and headaches, who presents to the ED with complaints of left nipple pain that began at 5 PM last night approximately 10 hours prior to evaluation. She describes the pain as 7/10 constant sharp nonradiating left nipple pain worse with laughing and with no treatments tried prior to arrival. She is currently lactating as she has a 1235-month-old baby, but she is not breast-feeding. She denies any swelling, lumps or masses, erythema, warmth, nipple discharge or drainage, nipple inversion, fevers, chills, CP, SOB, abd pain, N/V/D/C, hematuria, dysuria, myalgias, arthralgias, numbness, tingling, focal weakness, or any other complaints at this time.    The history is provided by the patient and medical records. No language interpreter was used.    Past Medical History:  Diagnosis Date  . Anemia   . Anxiety   . Depression    ppd with first child  . Gestational diabetes    diet controlled  . Headache(784.0)   . Hx of chlamydia infection   . Pregnancy induced hypertension   . Urinary tract infection   . Vaginal Pap smear, abnormal    HSIL 06/16/15- Needs colpo    Patient Active Problem List   Diagnosis Date Noted  . SVD (spontaneous vaginal delivery) 03/21/2016  . Labor and delivery indication for care or intervention 03/19/2016  . Polyhydramnios in third trimester, antepartum complication 03/16/2016  . Anemia affecting pregnancy, antepartum 02/17/2016  . Noncompliant pregnant patient, antepartum 02/16/2016  . Pregnancy complicated by fetal cerebral ventriculomegaly 11/04/2015  . Abnormal fetal ultrasound 11/04/2015  . Previous preterm delivery, antepartum 10/21/2015  . HSIL  (high grade squamous intraepithelial lesion) on Pap smear of cervix 10/21/2015  . Supervision of high-risk pregnancy 10/19/2015  . Gestational diabetes mellitus (GDM), antepartum 10/19/2015  . Pica 10/19/2015  . History of preterm delivery 10/19/2015  . Smoker 12/10/2014    Past Surgical History:  Procedure Laterality Date  . COLPOSCOPY      OB History    Gravida Para Term Preterm AB Living   7 6 5 1 1 6    SAB TAB Ectopic Multiple Live Births   1 0 0 0 6       Home Medications    Prior to Admission medications   Medication Sig Start Date End Date Taking? Authorizing Provider  acetaminophen (TYLENOL) 325 MG tablet Take 2 tablets (650 mg total) by mouth every 4 (four) hours as needed (for pain scale < 4). Patient not taking: Reported on 01/08/2017 03/21/16   Almon HerculesGonfa, Taye T, MD  docusate sodium (COLACE) 100 MG capsule Take 1 capsule (100 mg total) by mouth 2 (two) times daily as needed. Patient not taking: Reported on 01/08/2017 02/28/16   Federico FlakeNewton, Kimberly Niles, MD  ibuprofen (ADVIL,MOTRIN) 600 MG tablet Take 1 tablet (600 mg total) by mouth every 6 (six) hours. Patient not taking: Reported on 01/08/2017 03/21/16   Almon HerculesGonfa, Taye T, MD  naproxen (NAPROSYN) 500 MG tablet Take 1 tablet (500 mg total) by mouth 2 (two) times daily. Patient not taking: Reported on 01/08/2017 11/06/16   Rolland PorterJames, Mark, MD  ondansetron (ZOFRAN ODT) 4 MG disintegrating tablet Take 1 tablet (4 mg total) by mouth every 8 (eight) hours  as needed for nausea or vomiting. 01/08/17   Barrett Henle, PA-C  Prenatal Vit-Fe Fumarate-FA (PRENATAL VITAMINS PLUS) 27-1 MG TABS Take 1 tablet by mouth daily. Reported on 12/02/2015 Patient not taking: Reported on 01/08/2017 02/28/16   Federico Flake, MD  ranitidine (ZANTAC) 150 MG tablet Take 1 tablet (150 mg total) by mouth 2 (two) times daily. Patient not taking: Reported on 03/19/2016 01/03/16   Beaulah Dinning, MD    Family History Family History  Problem Relation  Age of Onset  . Asthma Son   . Asthma Paternal Aunt   . Anesthesia problems Neg Hx   . Hypotension Neg Hx   . Malignant hyperthermia Neg Hx   . Pseudochol deficiency Neg Hx   . Other Neg Hx   . Cancer Neg Hx   . Heart disease Neg Hx   . Stroke Neg Hx   . Hearing loss Neg Hx     Social History Social History  Substance Use Topics  . Smoking status: Former Smoker    Packs/day: 0.25    Quit date: 09/23/2015  . Smokeless tobacco: Never Used     Comment: quit  prior to preg  . Alcohol use No     Allergies   Patient has no known allergies.   Review of Systems Review of Systems  Constitutional: Negative for chills and fever.  Respiratory: Negative for shortness of breath.   Cardiovascular: Negative for chest pain.  Gastrointestinal: Negative for abdominal pain, constipation, diarrhea, nausea and vomiting.  Genitourinary: Negative for dysuria and hematuria.  Musculoskeletal: Negative for arthralgias and myalgias.       +L breast pain No nipple discharge, inversion, redness/warmth/swelling, etc  Skin: Negative for color change.  Allergic/Immunologic: Negative for immunocompromised state.  Neurological: Negative for weakness and numbness.  Psychiatric/Behavioral: Negative for confusion.   All other systems reviewed and are negative for acute change except as noted in the HPI.    Physical Exam Updated Vital Signs BP 109/81 (BP Location: Left Arm)   Pulse (!) 109   Temp 98.1 F (36.7 C) (Oral)   Resp 16   Ht 5\' 1"  (1.549 m)   Wt 43.1 kg   LMP 01/30/2017   SpO2 97%   BMI 17.95 kg/m   Physical Exam  Constitutional: She is oriented to person, place, and time. Vital signs are normal. She appears well-developed and well-nourished.  Non-toxic appearance. No distress.  Afebrile, nontoxic, NAD  HENT:  Head: Normocephalic and atraumatic.  Mouth/Throat: Mucous membranes are normal.  Eyes: Conjunctivae and EOM are normal. Right eye exhibits no discharge. Left eye exhibits  no discharge.  Neck: Normal range of motion. Neck supple.  Cardiovascular: Normal rate and intact distal pulses.   Pulmonary/Chest: Effort normal. No respiratory distress. Right breast exhibits no inverted nipple, no mass, no nipple discharge, no skin change and no tenderness. Left breast exhibits tenderness. Left breast exhibits no inverted nipple, no mass, no nipple discharge and no skin change. Breasts are symmetrical. There is no breast swelling.  Chaperone present for exam L breast without masses or lumps, nipple without inversion, mild TTP around the entire nipple and areola without specific focal area, no overlying skin changes, no warmth/redness, no nipple discharge, no peau d'orange. No asymmetry of breasts.   Abdominal: Normal appearance. She exhibits no distension.  Genitourinary: No breast discharge.  Musculoskeletal: Normal range of motion.  Lymphadenopathy:    She has no axillary adenopathy.  No axillary LAD  Neurological: She is alert  and oriented to person, place, and time. She has normal strength. No sensory deficit.  Skin: Skin is warm, dry and intact. No rash noted.  Psychiatric: She has a normal mood and affect. Her behavior is normal.  Nursing note and vitals reviewed.    ED Treatments / Results  Labs (all labs ordered are listed, but only abnormal results are displayed) Labs Reviewed - No data to display  EKG  EKG Interpretation None       Radiology No results found.  Procedures Procedures (including critical care time)  Medications Ordered in ED Medications - No data to display   Initial Impression / Assessment and Plan / ED Course  I have reviewed the triage vital signs and the nursing notes.  Pertinent labs & imaging results that were available during my care of the patient were reviewed by me and considered in my medical decision making (see chart for details).     31 y.o. female here with L nipple pain x10 hours. Lactating but not breast  feeding. On exam, no nipple discharge, breast redness, swelling, warmth, or skin changes. No nipple inversion. Mild tenderness around the nipple. No masses felt. No axillary LAD. Breasts symmetric. When I started to discuss with pt that she didn't have evidence of mastitis or an abscess, she cut me off and stated she demanded an xray. When I was trying to explain that there is not an xray for the breast, she became agitated and was rude; I tried to explain to her that this could be a clogged milk duct vs ligamentous stretching pain, but pt was very upset and demanded that she either have an xray, or not be billed for today's visit. I advised that she try heat, gentle massage to unclog any ducts that could be clogged, and alternate between tylenol and motrin, and f/up with OBGYN/PCP in 1wk. Pt was very upset that we were not doing anything further for her and demanded her paperwork immediately so she could leave. I explained the diagnosis and have given explicit precautions to return to the ER including for any other new or worsening symptoms. The patient understands the medical plan as it's been dictated and I have answered their questions. Discharge instructions concerning home care and prescriptions have been given. The patient is STABLE and is discharged to home in good condition.    Final Clinical Impressions(s) / ED Diagnoses   Final diagnoses:  Breast pain in female    New Prescriptions New Prescriptions   No medications on [address], [place], [place] 02/23/17 1610    Azalia Bilis, MD 02/23/17 [phone]

## 2017-03-01 ENCOUNTER — Encounter (HOSPITAL_COMMUNITY): Payer: Self-pay | Admitting: *Deleted

## 2017-03-01 ENCOUNTER — Ambulatory Visit (HOSPITAL_COMMUNITY)
Admission: EM | Admit: 2017-03-01 | Discharge: 2017-03-01 | Disposition: A | Discharge status: Home / Self Care | Payer: Self-pay | Attending: Internal Medicine | Admitting: Internal Medicine

## 2017-03-01 DIAGNOSIS — F419 Anxiety disorder, unspecified: Secondary | ICD-10-CM | POA: Insufficient documentation

## 2017-03-01 DIAGNOSIS — Z3201 Encounter for pregnancy test, result positive: Secondary | ICD-10-CM | POA: Insufficient documentation

## 2017-03-01 DIAGNOSIS — F17210 Nicotine dependence, cigarettes, uncomplicated: Secondary | ICD-10-CM | POA: Insufficient documentation

## 2017-03-01 DIAGNOSIS — O99331 Smoking (tobacco) complicating pregnancy, first trimester: Secondary | ICD-10-CM | POA: Insufficient documentation

## 2017-03-01 DIAGNOSIS — O24419 Gestational diabetes mellitus in pregnancy, unspecified control: Secondary | ICD-10-CM | POA: Insufficient documentation

## 2017-03-01 DIAGNOSIS — N926 Irregular menstruation, unspecified: Secondary | ICD-10-CM

## 2017-03-01 DIAGNOSIS — R110 Nausea: Secondary | ICD-10-CM

## 2017-03-01 DIAGNOSIS — Z825 Family history of asthma and other chronic lower respiratory diseases: Secondary | ICD-10-CM | POA: Insufficient documentation

## 2017-03-01 DIAGNOSIS — R51 Headache: Secondary | ICD-10-CM

## 2017-03-01 DIAGNOSIS — F329 Major depressive disorder, single episode, unspecified: Secondary | ICD-10-CM | POA: Insufficient documentation

## 2017-03-01 DIAGNOSIS — O99341 Other mental disorders complicating pregnancy, first trimester: Secondary | ICD-10-CM | POA: Insufficient documentation

## 2017-03-01 LAB — HCG, SERUM, QUALITATIVE: Preg, Serum: POSITIVE — AB

## 2017-03-01 LAB — POCT PREGNANCY, URINE: Preg Test, Ur: POSITIVE — AB

## 2017-03-01 NOTE — ED Triage Notes (Signed)
Pt  Reports  Had   A    Positive     pregnancy  Test   Yesterday      Pt     Reports  Period  Is  sev  Days   Late       Has     Symptoms   Nausea  And  Headaches  Breast  Is  Tender  And  Lactating

## 2017-03-01 NOTE — Discharge Instructions (Addendum)
Urine pregnancy test was positive at the urgent care today.  A blood test to confirm that result is pending.  The urgent care will contact you if it is positive.  If it is positive, [facility] follow[facility] care.

## 2017-03-01 NOTE — ED Provider Notes (Signed)
[facility]    CSN: [id number] Arrival date & time: 03/01/17  1141     History   Chief Complaint Chief Complaint  Patient presents with  . Possible Pregnancy    HPI Margaret Cain is a [age] y.o. female. presents today after a couple urine pregnancy tests at home were positive.  Feels good, no vag bleeding.  Little bit of cramping yesterday afternoon.  Desires pregnancy.  Accompanied by partner.  HPI  Past Medical History:  Diagnosis Date  . Anemia   . Anxiety   . Depression    ppd with first child  . Gestational diabetes    diet controlled  . Headache(784.0)   . Hx of chlamydia infection   . Pregnancy induced hypertension   . Urinary tract infection   . Vaginal Pap smear, abnormal    HSIL 06/16/15- Needs colpo    Patient Active Problem List   Diagnosis Date Noted  . SVD (spontaneous vaginal delivery) 03/21/2016  . Labor and delivery indication for care or intervention 03/19/2016  . Polyhydramnios in third trimester, antepartum complication 03/16/2016  . Anemia affecting pregnancy, antepartum 02/17/2016  . Noncompliant pregnant patient, antepartum 02/16/2016  . Pregnancy complicated by fetal cerebral ventriculomegaly 11/04/2015  . Abnormal fetal ultrasound 11/04/2015  . Previous preterm delivery, antepartum 10/21/2015  . HSIL (high grade squamous intraepithelial lesion) on Pap smear of cervix 10/21/2015  . Supervision of high-risk pregnancy 10/19/2015  . Gestational diabetes mellitus (GDM), antepartum 10/19/2015  . Pica 10/19/2015  . History of preterm delivery 10/19/2015  . Smoker 12/10/2014    Past Surgical History:  Procedure Laterality Date  . COLPOSCOPY      OB History    Gravida Para Term Preterm AB Living   7 6 5 1 1 6    SAB TAB Ectopic Multiple Live Births   1 0 0 0 6       Home Medications    Prior to Admission medications   Medication Sig Start Date End Date Taking? Authorizing Provider  ondansetron (ZOFRAN ODT) 4 MG  disintegrating tablet Take 1 tablet (4 mg total) by mouth every 8 (eight) hours as needed for nausea or vomiting. 01/08/17   Barrett HenleNadeau, Nicole Elizabeth, PA-C    Family History Family History  Problem Relation Age of Onset  . Asthma Son   . Asthma Paternal Aunt   . Anesthesia problems Neg Hx   . Hypotension Neg Hx   . Malignant hyperthermia Neg Hx   . Pseudochol deficiency Neg Hx   . Other Neg Hx   . Cancer Neg Hx   . Heart disease Neg Hx   . Stroke Neg Hx   . Hearing loss Neg Hx     Social History Social History  Substance Use Topics  . Smoking status: Former Smoker    Packs/day: 0.25    Quit date: 09/23/2015  . Smokeless tobacco: Never Used     Comment: quit  prior to preg  . Alcohol use No     Allergies   Patient has no known allergies.   Review of Systems Review of Systems  All other systems reviewed and are negative.    Physical Exam Triage Vital Signs ED Triage Vitals [03/01/17 1221]  Enc Vitals Group     BP 116/67     Pulse Rate 63     Resp 14     Temp 98.2 F (36.8 C)     Temp Source Oral     SpO2 100 %  Weight      Height      Pain Score      Pain Loc    Updated Vital Signs BP 116/67 (BP Location: Right Arm)   Pulse 63   Temp 98.2 F (36.8 C) (Oral)   Resp 14   LMP 02/01/2017   SpO2 100%   Physical Exam  Constitutional: She is oriented to person, place, and time. No distress.  HENT:  Head: Atraumatic.  Eyes:  Conjugate gaze observed, no eye redness/discharge  Neck: Neck supple.  Cardiovascular: Normal rate.   Pulmonary/Chest: No respiratory distress.  Abdominal: She exhibits no distension.  Musculoskeletal: Normal range of motion.  Neurological: She is alert and oriented to person, place, and time.  Skin: Skin is warm and dry.  Nursing note and vitals reviewed.    UC Treatments / Results  Labs (all labs ordered are listed, but only abnormal results are displayed) Labs Reviewed  POCT PREGNANCY, URINE - Abnormal; Notable for  the following:       Result Value   Preg Test, Ur POSITIVE (*)    All other components within normal limits  HCG, SERUM, QUALITATIVE    Procedures Procedures (including critical care time) None today  Final Clinical Impressions(s) / UC Diagnoses   Final diagnoses:  Pregnancy test positive   Urine pregnancy test was positive at the urgent care today.  A blood test to confirm that result is pending.  The urgent care will contact you if it is positive.  If it is positive, please schedule an appointment with the [facility] for followup care.     Eustace Moore, MD 03/04/17 [phone]

## 2017-03-03 ENCOUNTER — Inpatient Hospital Stay (HOSPITAL_COMMUNITY)
Admission: AD | Admit: 2017-03-03 | Discharge: 2017-03-03 | Disposition: A | Discharge status: Home / Self Care | Payer: Medicaid Other | Source: Ambulatory Visit | Attending: Obstetrics and Gynecology | Admitting: Obstetrics and Gynecology

## 2017-03-03 ENCOUNTER — Encounter (HOSPITAL_COMMUNITY): Payer: Self-pay

## 2017-03-03 DIAGNOSIS — B9689 Other specified bacterial agents as the cause of diseases classified elsewhere: Secondary | ICD-10-CM | POA: Insufficient documentation

## 2017-03-03 DIAGNOSIS — Z87891 Personal history of nicotine dependence: Secondary | ICD-10-CM | POA: Diagnosis not present

## 2017-03-03 DIAGNOSIS — N898 Other specified noninflammatory disorders of vagina: Secondary | ICD-10-CM

## 2017-03-03 DIAGNOSIS — Z3A01 Less than 8 weeks gestation of pregnancy: Secondary | ICD-10-CM | POA: Insufficient documentation

## 2017-03-03 DIAGNOSIS — B3731 Acute candidiasis of vulva and vagina: Secondary | ICD-10-CM

## 2017-03-03 DIAGNOSIS — Z825 Family history of asthma and other chronic lower respiratory diseases: Secondary | ICD-10-CM | POA: Diagnosis not present

## 2017-03-03 DIAGNOSIS — O9989 Other specified diseases and conditions complicating pregnancy, childbirth and the puerperium: Secondary | ICD-10-CM | POA: Diagnosis not present

## 2017-03-03 DIAGNOSIS — O23591 Infection of other part of genital tract in pregnancy, first trimester: Secondary | ICD-10-CM | POA: Diagnosis not present

## 2017-03-03 DIAGNOSIS — Z8619 Personal history of other infectious and parasitic diseases: Secondary | ICD-10-CM | POA: Diagnosis not present

## 2017-03-03 DIAGNOSIS — B373 Candidiasis of vulva and vagina: Secondary | ICD-10-CM | POA: Insufficient documentation

## 2017-03-03 DIAGNOSIS — O131 Gestational [pregnancy-induced] hypertension without significant proteinuria, first trimester: Secondary | ICD-10-CM | POA: Diagnosis not present

## 2017-03-03 DIAGNOSIS — O24410 Gestational diabetes mellitus in pregnancy, diet controlled: Secondary | ICD-10-CM | POA: Insufficient documentation

## 2017-03-03 DIAGNOSIS — Z87440 Personal history of urinary (tract) infections: Secondary | ICD-10-CM | POA: Diagnosis not present

## 2017-03-03 DIAGNOSIS — Z9889 Other specified postprocedural states: Secondary | ICD-10-CM | POA: Insufficient documentation

## 2017-03-03 LAB — URINALYSIS, ROUTINE W REFLEX MICROSCOPIC
Bilirubin Urine: NEGATIVE
Glucose, UA: NEGATIVE mg/dL
Hgb urine dipstick: NEGATIVE
Ketones, ur: NEGATIVE mg/dL
Leukocytes, UA: NEGATIVE
NITRITE: NEGATIVE
Protein, ur: NEGATIVE mg/dL
SPECIFIC GRAVITY, URINE: 1.017 (ref 1.005–1.030)
pH: 5 (ref 5.0–8.0)

## 2017-03-03 LAB — WET PREP, GENITAL
Clue Cells Wet Prep HPF POC: NONE SEEN
SPERM: NONE SEEN
TRICH WET PREP: NONE SEEN

## 2017-03-03 MED ORDER — TERCONAZOLE 0.4 % VA CREA: 1.0000 | TOPICAL_CREAM | Freq: Every day | VAGINAL | 0 refills | Status: DC

## 2017-03-03 NOTE — MAU Provider Note (Signed)
History     CSN: [id number]  Arrival date and time: 03/03/17 0808   First Provider Initiated Contact with Patient 03/03/17 [phone]      Chief Complaint  Patient presents with  . Vaginal Discharge   HPI   Margaret Cain is a 31 y.o. female [phone] @ 5664w2d here in MAU with concerns about a yeast infection. Symptoms started this morning. She complains of a cloudy discharge, and vaginal itching. She tried nothing over the counter.  She denies vaginal bleeding or leaking of fluid.   OB History    Gravida Para Term Preterm AB Living   8 6 5 1 1 6    SAB TAB Ectopic Multiple Live Births   1 0 0 0 6      Past Medical History:  Diagnosis Date  . Anemia   . Anxiety   . Depression    ppd with first child  . Gestational diabetes    diet controlled  . Headache(784.0)   . Hx of chlamydia infection   . Pregnancy induced hypertension   . Urinary tract infection   . 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 COLPOSCOPY      Family History  Problem Relation Age of Onset  . Asthma Son   . Asthma Paternal Aunt   . Anesthesia problems Neg Hx   . Hypotension Neg Hx   . Malignant hyperthermia Neg Hx   . Pseudochol deficiency Neg Hx   . Other Neg Hx   . Cancer Neg Hx   . Heart disease Neg Hx   . Stroke Neg Hx   . Hearing loss Neg Hx     Social History  Substance Use Topics  . Smoking status: Former Smoker    Packs/day: 0.25    Quit date: 09/23/2015  . Smokeless tobacco: Never Used     Comment: quit  prior to preg  . Alcohol use No    Allergies: No Known Allergies  Prescriptions Prior to Admission  Medication Sig Dispense Refill Last Dose  . ondansetron (ZOFRAN ODT) 4 MG disintegrating tablet Take 1 tablet (4 mg total) by mouth every 8 (eight) hours as needed for nausea or vomiting. 10 tablet 0    Results for orders placed or performed during the hospital encounter of 03/03/17 (from the past 48  hour(s))  Urinalysis, Routine w reflex microscopic     Status: None   Collection Time: 03/03/17  8:15 AM  Result Value Ref Range   Color, Urine YELLOW YELLOW   APPearance CLEAR CLEAR   Specific Gravity, Urine 1.017 1.005 - 1.030   pH 5.0 5.0 - 8.0   Glucose, UA NEGATIVE NEGATIVE mg/dL   Hgb urine dipstick NEGATIVE NEGATIVE   Bilirubin Urine NEGATIVE NEGATIVE   Ketones, ur NEGATIVE NEGATIVE mg/dL   Protein, ur NEGATIVE NEGATIVE mg/dL   Nitrite NEGATIVE NEGATIVE   Leukocytes, UA NEGATIVE NEGATIVE  Wet prep, genital     Status: Abnormal   Collection Time: 03/03/17  9:00 AM  Result Value Ref Range   Yeast Wet Prep HPF POC PRESENT (A) NONE SEEN   Trich, Wet Prep NONE SEEN NONE SEEN   Clue Cells Wet Prep HPF POC NONE SEEN NONE SEEN   WBC, Wet Prep HPF POC FEW (A) NONE SEEN    Comment: BACTERIA- TOO NUMEROUS TO COUNT   Sperm NONE SEEN    Review of Systems  Gastrointestinal:  Negative for abdominal pain.  Genitourinary: Positive for vaginal discharge. Negative for vaginal bleeding.   Physical Exam   Blood pressure 107/64, pulse 76, temperature 98.1 F (36.7 C), temperature source Oral, resp. rate 15, height 5\' 1"  (1.549 m), weight 99 lb (44.9 kg), last menstrual period 02/01/2017, unknown if currently breastfeeding.  Physical Exam  Constitutional: She appears well-developed and well-nourished. No distress.  HENT:  Head: Normocephalic.  Eyes: Pupils are equal, round, and reactive to light.  GI: Soft. She exhibits no distension. There is no tenderness. There is no rebound.  Genitourinary:  Genitourinary Comments: Wet prep and GC collected without speculum.   Neurological: She is alert.  Skin: Skin is warm. She is not diaphoretic.    MAU Course  Procedures  None  MDM   Wet prep & GC UA   Assessment and Plan   A:  1. Yeast vaginitis   2. Vaginal discharge     P:  Discharge home in stable condition Rx: Terazol Return to MAU for emergencies only   Juliani Laduke,  Harolyn Rutherford, NP 03/03/2017 2:13 PM

## 2017-03-03 NOTE — Discharge Instructions (Signed)
Probiotics What are probiotics? Probiotics are the good bacteria and yeasts that live in your body and keep you and your digestive system healthy. Probiotics also help your body's defense (immune) system and protect your body against bad bacterial growth. Certain foods contain probiotics, such as yogurt. Probiotics can also be purchased as a supplement. As with any supplement or drug, it is important to discuss its use with your health care provider. What affects the balance of bacteria in my body? The balance of bacteria in your body can be affected by:  Antibiotic medicines. Antibiotics are sometimes necessary to treat infection. Unfortunately, they may kill good or friendly bacteria in your body as well as the bad bacteria. This may lead to stomach problems like diarrhea, gas, and cramping.  Disease. Some conditions are the result of an overgrowth of bad bacteria, yeasts, parasites, or fungi. These conditions include:  Infectious diarrhea.  Stomach and respiratory infections.  Skin infections.  Irritable bowel syndrome (IBS).  Inflammatory bowel diseases.  Ulcer due to Helicobacter pylori (H. pylori) infection.  Tooth decay and periodontal disease.  Vaginal infections. Stress and poor diet may also lower the good bacteria in your body. What type of probiotic is right for me? Probiotics are available over the counter at your local pharmacy, health food, or grocery store. They come in many different forms, combinations of strains, and dosing strengths. Some may need to be refrigerated. Always read the label for storage and usage instructions. Specific strains have been shown to be more effective for certain conditions. Ask your health care provider what option is best for you. Why would I need probiotics? There are many reasons your health care provider might recommend a probiotic supplement, including:  Diarrhea.  Constipation.  IBS.  Respiratory infections.  Yeast  infections.  Acne, eczema, and other skin conditions.  Frequent urinary tract infections (UTIs). Are there side effects of probiotics? Some people experience mild side effects when taking probiotics. Side effects are usually temporary and may include:  Gas.  Bloating.  Cramping. Rarely, serious side effects, such as infection or immune system changes, may occur. What else do I need to know about probiotics?  There are many different strains of probiotics. Certain strains may be more effective depending on your condition. Probiotics are available in varying doses. Ask your health care provider which probiotic you should use and how often.  If you are taking probiotics along with antibiotics, it is generally recommended to wait at least 2 hours between taking the antibiotic and taking the probiotic. For more information: [facility] http://potts.com/http://nccam.nih.gov/ This information is not intended to replace advice given to you by your health care provider. Make sure you discuss any questions you have with your health care provider. Document Released: 04/29/2014 Document Revised: 08/29/2016 Document Reviewed: 12/30/2013 Elsevier Interactive Patient Education  2017 Elsevier Inc.   Vaginitis Vaginitis is an inflammation of the vagina. It can happen when the normal bacteria and yeast in the vagina grow too much. There are different types. Treatment will depend on the type you have. Follow these instructions at home:  Take all medicines as told by your doctor.  Keep your vagina area clean and dry. Avoid soap. Rinse the area with water.  Avoid washing and cleaning out the vagina (douching).  Do not use tampons or have sex (intercourse) until your treatment is done.  Wipe from front to back after going to the restroom.  Wear cotton underwear.  Avoid wearing underwear  while you sleep until your vaginitis is gone.  Avoid tight pants. Avoid  underwear or nylons without a cotton panel.  Take off wet clothing (such as a bathing suit) as soon as you can.  Use mild, unscented products. Avoid fabric softeners and scented:  Feminine sprays.  Laundry detergents.  Tampons.  Soaps or bubble baths.  Practice safe sex and use condoms. Get help right away if:  You have belly (abdominal) pain.  You have a fever or lasting symptoms for more than 2-3 days.  You have a fever and your symptoms suddenly get worse. This information is not intended to replace advice given to you by your health care provider. Make sure you discuss any questions you have with your health care provider. Document Released: 12/29/2008 Document Revised: 03/09/2016 Document Reviewed: 03/14/2012 Elsevier Interactive Patient Education  2017 [employer].

## 2017-03-03 NOTE — MAU Note (Signed)
Pt reports vaginal discharge and itching since this am.

## 2017-03-04 LAB — HIV ANTIBODY (ROUTINE TESTING W REFLEX): HIV Screen 4th Generation wRfx: NONREACTIVE

## 2017-03-05 ENCOUNTER — Inpatient Hospital Stay (HOSPITAL_COMMUNITY)
Admission: AD | Admit: 2017-03-05 | Discharge: 2017-03-05 | Disposition: A | Discharge status: Home / Self Care | Payer: Medicaid Other | Source: Ambulatory Visit | Attending: Obstetrics & Gynecology | Admitting: Obstetrics & Gynecology

## 2017-03-05 ENCOUNTER — Ambulatory Visit: Payer: Self-pay

## 2017-03-05 ENCOUNTER — Telehealth: Payer: Self-pay | Admitting: Obstetrics and Gynecology

## 2017-03-05 ENCOUNTER — Encounter (HOSPITAL_COMMUNITY): Payer: Self-pay | Admitting: *Deleted

## 2017-03-05 DIAGNOSIS — O9989 Other specified diseases and conditions complicating pregnancy, childbirth and the puerperium: Secondary | ICD-10-CM

## 2017-03-05 DIAGNOSIS — F329 Major depressive disorder, single episode, unspecified: Secondary | ICD-10-CM | POA: Insufficient documentation

## 2017-03-05 DIAGNOSIS — W0110XA Fall on same level from slipping, tripping and stumbling with subsequent striking against unspecified object, initial encounter: Secondary | ICD-10-CM | POA: Insufficient documentation

## 2017-03-05 DIAGNOSIS — Z3A01 Less than 8 weeks gestation of pregnancy: Secondary | ICD-10-CM | POA: Diagnosis not present

## 2017-03-05 DIAGNOSIS — F419 Anxiety disorder, unspecified: Secondary | ICD-10-CM | POA: Insufficient documentation

## 2017-03-05 DIAGNOSIS — W19XXXA Unspecified fall, initial encounter: Secondary | ICD-10-CM

## 2017-03-05 DIAGNOSIS — O99341 Other mental disorders complicating pregnancy, first trimester: Secondary | ICD-10-CM | POA: Insufficient documentation

## 2017-03-05 DIAGNOSIS — O98811 Other maternal infectious and parasitic diseases complicating pregnancy, first trimester: Secondary | ICD-10-CM | POA: Insufficient documentation

## 2017-03-05 DIAGNOSIS — N898 Other specified noninflammatory disorders of vagina: Secondary | ICD-10-CM | POA: Diagnosis present

## 2017-03-05 DIAGNOSIS — B3731 Acute candidiasis of vulva and vagina: Secondary | ICD-10-CM

## 2017-03-05 DIAGNOSIS — Z87891 Personal history of nicotine dependence: Secondary | ICD-10-CM | POA: Insufficient documentation

## 2017-03-05 DIAGNOSIS — B373 Candidiasis of vulva and vagina: Secondary | ICD-10-CM | POA: Diagnosis not present

## 2017-03-05 HISTORY — DX: Trichomoniasis, unspecified: A59.9

## 2017-03-05 LAB — GC/CHLAMYDIA PROBE AMP (~~LOC~~) NOT AT ARMC
Chlamydia: NEGATIVE
Neisseria Gonorrhea: NEGATIVE

## 2017-03-05 MED ORDER — CLOTRIMAZOLE 1 % VA CREA
1.0000 | TOPICAL_CREAM | Freq: Every day | VAGINAL | Status: DC
  Administered 2017-03-05: 1 via VAGINAL
  Filled 2017-03-05: qty 45

## 2017-03-05 MED ORDER — MICONAZOLE NITRATE 2 % VA CREA: 1.0000 | TOPICAL_CREAM | Freq: Every day | VAGINAL | Status: DC

## 2017-03-05 NOTE — Telephone Encounter (Signed)
Patient called and made an appointment to start her care for prenatal. She stated she was suppose to get an ultrasound.

## 2017-03-05 NOTE — Discharge Instructions (Signed)

## 2017-03-05 NOTE — MAU Provider Note (Signed)
History     CSN: [id number]  Arrival date and time: 03/05/17 7829   First Provider Initiated Contact with Patient 03/05/17 2018      Chief Complaint  Patient presents with  . Vaginal Discharge   [id number] @[redacted]w[redacted]d  by LMP here for vaginal itching and fall. Vaginal itching started about 3 days ago. She was seen in MAU 2 days ago and given Rx for Terazol but couldn't afford to get it or OTC Monistat. She also reports slipping on a wet floor yesterday and hit her back and head. No LOC. She had a HA and took Tylenol which helped and she was able to sleep. No visual disturbances. No N/V. No visual changes.    OB History    Gravida Para Term Preterm AB Living   8 6 5 1 1 6    SAB TAB Ectopic Multiple Live Births   1 0 0 0 6      Past Medical History:  Diagnosis Date  . Anemia   . Anxiety   . Depression    ppd with first child  . Gestational diabetes    diet controlled  . Headache(784.0)   . Hx of chlamydia infection   . Pregnancy induced hypertension   . Trichomonosis   . Urinary tract infection   . 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 COLPOSCOPY      Family History  Problem Relation Age of Onset  . Asthma Son   . Asthma Paternal Aunt   . Anesthesia problems Neg Hx   . Hypotension Neg Hx   . Malignant hyperthermia Neg Hx   . Pseudochol deficiency Neg Hx   . Other Neg Hx   . Cancer Neg Hx   . Heart disease Neg Hx   . Stroke Neg Hx   . Hearing loss Neg Hx     Social History  Substance Use Topics  . Smoking status: Former Smoker    Packs/day: 0.25    Quit date: 09/23/2015  . Smokeless tobacco: Never Used     Comment: quit  prior to preg  . Alcohol use No    Allergies: No Known Allergies  Prescriptions Prior to Admission  Medication Sig Dispense Refill Last Dose  . terconazole (TERAZOL 7) 0.4 % vaginal cream Place 1 applicator vaginally at bedtime. (Patient not taking: Reported on  03/05/2017) 45 g 0 Not Taking at Unknown time    Review of Systems  Eyes: Negative for photophobia and visual disturbance.  Gastrointestinal: Negative for abdominal pain, nausea and vomiting.  Genitourinary: Positive for vaginal pain (irritation). Negative for vaginal bleeding.  Neurological: Positive for headaches. Negative for dizziness, weakness and numbness.   Physical Exam   Blood pressure 104/62, pulse 75, temperature 97.8 F (36.6 C), temperature source Oral, resp. rate 16, height 5\' 1"  (1.549 m), weight 45.4 kg (100 lb), last menstrual period 02/01/2017, SpO2 100 %, unknown if currently breastfeeding.  Physical Exam  Nursing note and vitals reviewed. Constitutional: She is oriented to person, place, and time. She appears well-developed and well-nourished. No distress.  HENT:  Head: Normocephalic and atraumatic. Head is without abrasion, without contusion and without laceration.  Eyes: Conjunctivae and EOM are normal. Pupils are equal, round, and reactive to light.  Neck: Normal range of motion.  Cardiovascular: Normal rate.   Respiratory: Effort normal.  Genitourinary:  Genitourinary Comments: External, thick white discharge, no erythema or edema  Musculoskeletal: Normal range of motion.       Cervical back: Normal. She exhibits no tenderness and no deformity.       Thoracic back: Normal. She exhibits no tenderness and no deformity.       Lumbar back: Normal. She exhibits no tenderness and no deformity.  Neurological: She is alert and oriented to person, place, and time. No cranial nerve deficit.  Skin: Skin is warm and dry.  Psychiatric: She has a normal mood and affect.   MAU Course  Procedures Clotrimazole vaginal cream  MDM Pt is very concerned since she can't afford OTC generic Monistat and how she will deal with the irritation until she has money toward the end of the week. I recommended tub soaks with baking soda and cool packs prn comfort. Ordered Clotrimazole for  her to start now and take remainder with her for full 7 night treatment. Neuro exam nml. No evidence of head injury. She is stable for discharge home.  Assessment and Plan   1. Yeast vaginitis   2. Fall, initial encounter   3. [redacted] weeks gestation of pregnancy    [facility] Tub soaks Cool packs Abstain from IC until sx resolved  Allergies as of 03/05/2017   No Known Allergies     Medication List    STOP taking these medications   terconazole 0.4 % vaginal cream Commonly known as:  TERAZOL 7      Donette LarryMelanie Ajamu Maxon, CNM 03/05/2017, 8:38 PM

## 2017-03-05 NOTE — MAU Note (Signed)
Pt reports still having some vaginal discharge and irritation. States she was unable to get prescription filled but it was too expensive. States she went to pick OTC yeast medication and it was also too expensive.

## 2017-03-07 NOTE — Telephone Encounter (Signed)
Attempted to call patient. There was no answer. Message left stating I am calling in response to your request for an ultrasound. Please return my call at the clinic.

## 2017-03-16 ENCOUNTER — Inpatient Hospital Stay (HOSPITAL_COMMUNITY): Payer: Medicaid Other

## 2017-03-16 ENCOUNTER — Encounter (HOSPITAL_COMMUNITY): Payer: Self-pay | Admitting: *Deleted

## 2017-03-16 ENCOUNTER — Inpatient Hospital Stay (HOSPITAL_COMMUNITY)
Admission: AD | Admit: 2017-03-16 | Discharge: 2017-03-16 | Disposition: A | Discharge status: Home / Self Care | Payer: Medicaid Other | Source: Ambulatory Visit | Attending: Obstetrics & Gynecology | Admitting: Obstetrics & Gynecology

## 2017-03-16 DIAGNOSIS — F329 Major depressive disorder, single episode, unspecified: Secondary | ICD-10-CM | POA: Diagnosis not present

## 2017-03-16 DIAGNOSIS — O99341 Other mental disorders complicating pregnancy, first trimester: Secondary | ICD-10-CM | POA: Insufficient documentation

## 2017-03-16 DIAGNOSIS — O26891 Other specified pregnancy related conditions, first trimester: Secondary | ICD-10-CM | POA: Insufficient documentation

## 2017-03-16 DIAGNOSIS — J45909 Unspecified asthma, uncomplicated: Secondary | ICD-10-CM | POA: Diagnosis not present

## 2017-03-16 DIAGNOSIS — F419 Anxiety disorder, unspecified: Secondary | ICD-10-CM | POA: Insufficient documentation

## 2017-03-16 DIAGNOSIS — R300 Dysuria: Secondary | ICD-10-CM | POA: Diagnosis present

## 2017-03-16 DIAGNOSIS — R109 Unspecified abdominal pain: Secondary | ICD-10-CM | POA: Diagnosis not present

## 2017-03-16 DIAGNOSIS — Z87891 Personal history of nicotine dependence: Secondary | ICD-10-CM | POA: Insufficient documentation

## 2017-03-16 DIAGNOSIS — Z3A01 Less than 8 weeks gestation of pregnancy: Secondary | ICD-10-CM | POA: Insufficient documentation

## 2017-03-16 LAB — URINALYSIS, ROUTINE W REFLEX MICROSCOPIC
Bacteria, UA: NONE SEEN
Bilirubin Urine: NEGATIVE
GLUCOSE, UA: NEGATIVE mg/dL
HGB URINE DIPSTICK: NEGATIVE
KETONES UR: 5 mg/dL — AB
NITRITE: NEGATIVE
PH: 7 (ref 5.0–8.0)
PROTEIN: NEGATIVE mg/dL
Specific Gravity, Urine: 1.023 (ref 1.005–1.030)

## 2017-03-16 MED ORDER — PREPLUS 27-1 MG PO TABS: 1.0000 | ORAL_TABLET | Freq: Every day | ORAL | 13 refills | Status: DC

## 2017-03-16 MED ORDER — PREPLUS 27-1 MG PO TABS: 1.0000 | ORAL_TABLET | Freq: Every day | ORAL | 12 refills | Status: DC

## 2017-03-16 NOTE — MAU Note (Signed)
Eating sausage biscuit and drinking orange juice when entered rm

## 2017-03-16 NOTE — MAU Provider Note (Signed)
[facility] Attending Note  History     CSN: [id number]  Arrival date & time 03/16/17  4098   First Provider Initiated Contact with Patient 03/16/17 1039      Chief Complaint  Patient presents with  . Dysuria    Margaret Cain is a 31 y.o. [id number] at [redacted]w[redacted]d who presents to MAU today for evaluation of dysuria and generalized lower abdominal pain.  Denies any abnormal vaginal discharge, fevers, chills, sweats, nausea, vomiting, other GI or GU symptoms or other general symptoms.   Obstetric History   G8   P6   T5   P1   A1   L6    SAB1   TAB0   Ectopic0   Multiple0   Live Births6     # Outcome Date GA Lbr Len/2nd Weight Sex Delivery Anes PTL Lv  8 Current           7 Term 03/19/16 [redacted]w[redacted]d 01:45 / 00:07 7 lb 5.1 oz (3.32 kg) M Vag-Spont EPI  LIV     Name: ESTELENE, CARMACK     Apgar1:  9                Apgar5: 9  6 Term 09/08/14 [redacted]w[redacted]d 03:40 / 00:07 6 lb 15.3 oz (3.155 kg) F Vag-Spont None  LIV     Apgar1:  8                Apgar5: 9  5 Term 09/20/13 [redacted]w[redacted]d 04:10 / 00:31 7 lb (3.175 kg) F Vag-Spont EPI  LIV     Name: [facility]     Apgar1:  9                Apgar5: 9  4 Preterm 07/17/12 [redacted]w[redacted]d 19:05 / 00:12 4 lb 5.7 oz (1.975 kg) M Vag-Spont EPI  LIV     Name: JEMMA, RASP     Apgar1:  9                Apgar5: 9  3 SAB 10/05/11          2 Term 10/15/10 [redacted]w[redacted]d  7 lb 2 oz (3.232 kg) M Vag-Spont   LIV  1 Term 2005 [redacted]w[redacted]d  5 lb 7 oz (2.466 kg) M Vag-Spont   LIV      Past Medical History:  Diagnosis Date  . Anemia   . Anxiety   . Depression    ppd with first child  . Gestational diabetes    diet controlled  . Headache(784.0)   . Hx of chlamydia infection   . Pregnancy induced hypertension   . Preterm labor   . Trichomonosis   . Urinary tract infection   . 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 COLPOSCOPY      Family History  Problem Relation Age of Onset  . Asthma Son    . Asthma Paternal Aunt   . Anesthesia problems Neg Hx   . Hypotension Neg Hx   . Malignant hyperthermia Neg Hx   . Pseudochol deficiency Neg Hx   . Other Neg Hx   . Cancer Neg Hx   . Heart disease Neg Hx   . Stroke Neg Hx   . Hearing loss Neg Hx     Social History  Substance Use Topics  . Smoking status: Former Smoker    Packs/day: 0.25  Types: Cigarettes    Quit date: 09/23/2015  . Smokeless tobacco: Never Used     Comment: quit  prior to preg  . Alcohol use No    No Known Allergies  No prescriptions prior to admission.     Physical Exam  BP 106/60 (BP Location: Right Arm)   Pulse 73   Temp 97.5 F (36.4 C) (Oral)   Resp 16   Wt 98 lb 4 oz (44.6 kg)   LMP 02/01/2017   SpO2 100%   BMI 18.56 kg/m  GENERAL: Well-developed, well-nourished female in no acute distress  SKIN: Warm, dry and without erythema PSYCH: Normal mood and affect HEENT: Normocephalic, atraumatic.   LUNGS: Normal respiratory effort, normal breath sounds HEART: Regular rate noted ABDOMEN: Soft, nondistended, nontender PELVIC: Deferred EXTREMITIES: No edema, no cyanosis, normal range of movement  MAU Course/MDM  Ultrasound ordered  Labs and Imaging   Results for orders placed or performed during the hospital encounter of 03/16/17 (from the past 24 hour(s))  Urinalysis, Routine w reflex microscopic     Status: Abnormal   Collection Time: 03/16/17  9:54 AM  Result Value Ref Range   Color, Urine YELLOW YELLOW   APPearance CLEAR CLEAR   Specific Gravity, Urine 1.023 1.005 - 1.030   pH 7.0 5.0 - 8.0   Glucose, UA NEGATIVE NEGATIVE mg/dL   Hgb urine dipstick NEGATIVE NEGATIVE   Bilirubin Urine NEGATIVE NEGATIVE   Ketones, ur 5 (A) NEGATIVE mg/dL   Protein, ur NEGATIVE NEGATIVE mg/dL   Nitrite NEGATIVE NEGATIVE   Leukocytes, UA TRACE (A) NEGATIVE   RBC / HPF 0-5 0 - 5 RBC/hpf   WBC, UA 6-30 0 - 5 WBC/hpf   Bacteria, UA NONE SEEN NONE SEEN   Squamous Epithelial / LPF 0-5 (A) NONE SEEN    Mucous PRESENT    Ultrasound: Normal IUP at 3153w1d  Assessment and Plan   1. Abdominal pain during pregnancy in first trimester    Normal IUP at 2553w1d UA negative. Unremarkable exam. Was told to return to MAU for any pain, bleeding or other concerns, or if her condition were to change or worsen. Discharged to home in stable condition. Will start care on 04/09/17.   Follow-up Information    [facility].   Why:  1pm for initial prenatal visit Contact information: [address] [place] [id number] [phone]         Allergies as of 03/16/2017   No Known Allergies     Medication List    TAKE these medications   PREPLUS 27-1 MG Tabs Take 1 tablet by mouth daily.        Jaynie CollinsUGONNA  Jaaziel Peatross, MD, FACOG Attending Obstetrician & Gynecologist, [facility], [facility]

## 2017-03-16 NOTE — Discharge Instructions (Signed)

## 2017-03-16 NOTE — MAU Note (Signed)
Lat night went to the bathroom, when she was urinating, she felt like her uterus was tightening up, happened again this morning also.  Denies frequency.

## 2017-03-18 ENCOUNTER — Encounter (HOSPITAL_COMMUNITY): Payer: Self-pay

## 2017-03-18 ENCOUNTER — Other Ambulatory Visit: Payer: Self-pay | Admitting: Obstetrics and Gynecology

## 2017-03-18 ENCOUNTER — Encounter: Payer: Self-pay | Admitting: Obstetrics and Gynecology

## 2017-03-18 DIAGNOSIS — B951 Streptococcus, group B, as the cause of diseases classified elsewhere: Secondary | ICD-10-CM

## 2017-03-18 DIAGNOSIS — R8271 Bacteriuria: Secondary | ICD-10-CM | POA: Insufficient documentation

## 2017-03-18 HISTORY — DX: Streptococcus, group b, as the cause of diseases classified elsewhere: B95.1

## 2017-03-18 LAB — CULTURE, OB URINE: Culture: 10000 — AB

## 2017-03-18 MED ORDER — PENICILLIN V POTASSIUM 500 MG PO TABS: 500.0000 mg | ORAL_TABLET | Freq: Four times a day (QID) | ORAL | 0 refills | Status: DC

## 2017-03-19 ENCOUNTER — Encounter: Payer: Self-pay | Admitting: General Practice

## 2017-03-19 NOTE — Telephone Encounter (Signed)
Per chart review, pt went to MAU on 6/1 with c/o dysuria and lower abdominal pain.  She had US which confirmed viable IUP.  New Ob appt is scheduled on 6/25. No further call needed at this time.

## 2017-03-29 ENCOUNTER — Encounter (HOSPITAL_COMMUNITY): Payer: Self-pay | Admitting: *Deleted

## 2017-03-29 ENCOUNTER — Inpatient Hospital Stay (HOSPITAL_COMMUNITY)
Admission: AD | Admit: 2017-03-29 | Discharge: 2017-03-29 | Disposition: A | Discharge status: Home / Self Care | Payer: Medicaid Other | Source: Ambulatory Visit | Attending: Obstetrics & Gynecology | Admitting: Obstetrics & Gynecology

## 2017-03-29 DIAGNOSIS — Z87891 Personal history of nicotine dependence: Secondary | ICD-10-CM | POA: Insufficient documentation

## 2017-03-29 DIAGNOSIS — F329 Major depressive disorder, single episode, unspecified: Secondary | ICD-10-CM | POA: Insufficient documentation

## 2017-03-29 DIAGNOSIS — O26891 Other specified pregnancy related conditions, first trimester: Secondary | ICD-10-CM | POA: Insufficient documentation

## 2017-03-29 DIAGNOSIS — Z3A08 8 weeks gestation of pregnancy: Secondary | ICD-10-CM | POA: Insufficient documentation

## 2017-03-29 DIAGNOSIS — O2341 Unspecified infection of urinary tract in pregnancy, first trimester: Secondary | ICD-10-CM | POA: Insufficient documentation

## 2017-03-29 DIAGNOSIS — F419 Anxiety disorder, unspecified: Secondary | ICD-10-CM | POA: Insufficient documentation

## 2017-03-29 DIAGNOSIS — O99341 Other mental disorders complicating pregnancy, first trimester: Secondary | ICD-10-CM | POA: Diagnosis not present

## 2017-03-29 LAB — URINALYSIS, ROUTINE W REFLEX MICROSCOPIC
Bilirubin Urine: NEGATIVE
Glucose, UA: NEGATIVE mg/dL
Ketones, ur: NEGATIVE mg/dL
Nitrite: NEGATIVE
PH: 7.5 (ref 5.0–8.0)
Protein, ur: NEGATIVE mg/dL
SPECIFIC GRAVITY, URINE: 1.01 (ref 1.005–1.030)

## 2017-03-29 LAB — URINALYSIS, MICROSCOPIC (REFLEX)

## 2017-03-29 MED ORDER — NITROFURANTOIN MONOHYD MACRO 100 MG PO CAPS: 100.0000 mg | ORAL_CAPSULE | Freq: Two times a day (BID) | ORAL | 0 refills | Status: DC

## 2017-03-29 NOTE — Discharge Instructions (Signed)

## 2017-03-29 NOTE — MAU Provider Note (Signed)
Chief Complaint: Abdominal Pain   First Provider Initiated Contact with Patient 03/29/17 [phone]        SUBJECTIVE HPI: Margaret Cain is a 31 y.o. [id number] at 5549w0d by LMP who presents to maternity admissions reporting lower abdominal pain, mostly on LLQ.  Started this morning.  No bleeding.  Had a confirmation US on 03/16/17 which showed IUP.  Has new OB appt today. She denies vaginal bleeding, vaginal itching/burning, urinary symptoms, h/a, dizziness, n/v, or fever/chills.     Abdominal Pain  This is a new problem. The current episode started today. The onset quality is gradual. The problem occurs constantly. The problem has been unchanged. The pain is located in the LLQ. The pain is moderate. The quality of the pain is aching and cramping. The abdominal pain does not radiate. Pertinent negatives include no anorexia, constipation, diarrhea, fever, frequency, myalgias, nausea or vomiting. The pain is aggravated by palpation. The pain is relieved by nothing. She has tried nothing for the symptoms.    RN Note: Pt reports she started having abd pain this morning. Denies any vag bleeding or discharge. Has appointment today with [facility]    Electronically signed by Madaline SavageWilson, Kathrine M, RN      Past Medical History:  Diagnosis Date  . Anemia   . Anxiety   . Depression    ppd with first child  . Gestational diabetes    diet controlled  . Headache(784.0)   . Hx of chlamydia infection   . Positive GBS test 03/18/2017  . Pregnancy induced hypertension   . Preterm labor   . Trichomonosis   . Urinary tract infection   . Vaginal Pap smear, abnormal    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   Past Surgical History:  Procedure Laterality Date  . COLPOSCOPY     Social History   Social History  . Marital status: Single    Spouse name: N/A  . Number of children: N/A  . Years of education: N/A   Occupational History  . Not on file.   Social History Main Topics  . Smoking status: Former Smoker     Packs/day: 0.25    Types: Cigarettes    Quit date: 09/23/2015  . Smokeless tobacco: Never Used     Comment: quit  prior to preg  . Alcohol use No  . Drug use: No  . Sexual activity: Yes    Birth control/ protection: None     Comment: 1 partner   Other Topics Concern  . Not on file   Social History Narrative  . No narrative on file   No current facility-administered medications on file prior to encounter.    Current Outpatient Prescriptions on File Prior to Encounter  Medication Sig Dispense Refill  . Prenatal Vit-Fe Fumarate-FA (PREPLUS) 27-1 MG TABS Take 1 tablet by mouth daily. 30 tablet 12   No Known Allergies  I have reviewed patient's Past Medical Hx, Surgical Hx, Family Hx, Social Hx, medications and allergies.   ROS:  Review of Systems  Constitutional: Negative for fever.  Gastrointestinal: Positive for abdominal pain. Negative for anorexia, constipation, diarrhea, nausea and vomiting.  Genitourinary: Negative for frequency.  Musculoskeletal: Negative for myalgias.   Review of Systems  Other systems negative   Physical Exam  Physical Exam Patient Vitals for the past 24 hrs:  BP Temp Pulse Resp Height Weight  03/29/17 0728 (!) 109/56 98.4 F (36.9 C) 92 18 5\' 1"  (1.549 m) 97 lb 2.9 oz (44.1 kg)  Constitutional: Well-developed, well-nourished female in no acute distress.  Cardiovascular: normal rate Respiratory: normal effort GI: Abd soft,  Mildly tender to palpation over LLQ. Pos BS x 4  No rebound or guarding. MS: Extremities nontender, no edema, normal ROM Neurologic: Alert and oriented x 4.  GU: Neg CVAT.  PELVIC EXAM: Cervix 0/long/high, firm, anterior, neg CMT FHT 160s by bedside US.    LAB RESULTS Results for orders placed or performed during the hospital encounter of 03/29/17 (from the past 24 hour(s))  Urinalysis, Routine w reflex microscopic     Status: Abnormal   Collection Time: 03/29/17  7:30 AM  Result Value Ref Range   Color, Urine  YELLOW YELLOW   APPearance CLEAR CLEAR   Specific Gravity, Urine 1.010 1.005 - 1.030   pH 7.5 5.0 - 8.0   Glucose, UA NEGATIVE NEGATIVE mg/dL   Hgb urine dipstick TRACE (A) NEGATIVE   Bilirubin Urine NEGATIVE NEGATIVE   Ketones, ur NEGATIVE NEGATIVE mg/dL   Protein, ur NEGATIVE NEGATIVE mg/dL   Nitrite NEGATIVE NEGATIVE   Leukocytes, UA SMALL (A) NEGATIVE  Urinalysis, Microscopic (reflex)     Status: Abnormal   Collection Time: 03/29/17  7:30 AM  Result Value Ref Range   RBC / HPF 0-5 0 - 5 RBC/hpf   WBC, UA TOO NUMEROUS TO COUNT 0 - 5 WBC/hpf   Bacteria, UA MANY (A) NONE SEEN   Squamous Epithelial / LPF 0-5 (A) NONE SEEN   Urine-Other MUCOUS PRESENT        IMAGING US Ob Comp Less 14 Wks  Result Date: 03/16/2017 CLINICAL DATA:  31 year old pregnant female presents with pelvic pain. No reported bleeding. Quantitative beta HCG not available at the time of this dictation. EDC by LMP: 11/08/2017, projecting to an expected gestational age of [redacted] weeks 1 day. EXAM: OBSTETRIC <14 WK [place] AND TRANSVAGINAL OB US TECHNIQUE: Both transabdominal and transvaginal ultrasound examinations were performed for complete evaluation of the gestation as well as the maternal uterus, adnexal regions, and pelvic cul-de-sac. Transvaginal technique was performed to assess early pregnancy. COMPARISON:  No prior scans from this gestation. FINDINGS: Intrauterine gestational sac: Single intrauterine gestational sac appears normal in size, shape and position. Yolk sac:  Visualized. Embryo:  Visualized. Embryonic Cardiac Activity: Regular rate and rhythm. Embryonic Heart Rate: 129  bpm CRL:  7.8  mm   6 w   4 d                  [place] EDC: 11/05/2017 Subchorionic hemorrhage:  None visualized. Maternal uterus/adnexae: Right ovary measures 3.7 x 1.6 x 2.0 cm. Left ovary was not visualized on the transabdominal or transvaginal images. No abnormal ovarian or adnexal masses are demonstrated. No free fluid in the pelvis. IMPRESSION: 1.  Single living intrauterine gestation at 6 weeks 4 days by crown-rump length, with no significant discrepancy with the expected gestational age of [redacted] weeks 1 day by provided menstrual dating. 2. No acute early first-trimester gestational abnormality. Electronically Signed   By: Delbert Phenix M.D.   On: 03/16/2017 11:34   US Ob Transvaginal  Result Date: 03/16/2017 CLINICAL DATA:  31 year old pregnant female presents with pelvic pain. No reported bleeding. Quantitative beta HCG not available at the time of this dictation. EDC by LMP: 11/08/2017, projecting to an expected gestational age of [redacted] weeks 1 day. EXAM: OBSTETRIC <14 WK [place] AND TRANSVAGINAL OB US TECHNIQUE: Both transabdominal and transvaginal ultrasound examinations were performed for complete evaluation of the gestation as well  as the maternal uterus, adnexal regions, and pelvic cul-de-sac. Transvaginal technique was performed to assess early pregnancy. COMPARISON:  No prior scans from this gestation. FINDINGS: Intrauterine gestational sac: Single intrauterine gestational sac appears normal in size, shape and position. Yolk sac:  Visualized. Embryo:  Visualized. Embryonic Cardiac Activity: Regular rate and rhythm. Embryonic Heart Rate: 129  bpm CRL:  7.8  mm   6 w   4 d                  [place] EDC: 11/05/2017 Subchorionic hemorrhage:  None visualized. Maternal uterus/adnexae: Right ovary measures 3.7 x 1.6 x 2.0 cm. Left ovary was not visualized on the transabdominal or transvaginal images. No abnormal ovarian or adnexal masses are demonstrated. No free fluid in the pelvis. IMPRESSION: 1. Single living intrauterine gestation at 6 weeks 4 days by crown-rump length, with no significant discrepancy with the expected gestational age of [redacted] weeks 1 day by provided menstrual dating. 2. No acute early first-trimester gestational abnormality. Electronically Signed   By: Delbert Phenix M.D.   On: 03/16/2017 11:34    MAU Management/MDM: US showed viable live single IUP UA  is consistent with urinary tract infection Will send to culture Will go ahead and treat presumptively due to pain. Advised to keep appt today for new OB  ASSESSMENT Single IUP at [redacted]w[redacted]d Probable urinary tract infection  PLAN Discharge home Rx Macrobid for UTI Keep appt in HD today  Pt stable at time of discharge. Encouraged to return here or to other Urgent Care/ED if she develops worsening of symptoms, increase in pain, fever, or other concerning symptoms.    Wynelle Bourgeois CNM, MSN Certified Nurse-Midwife 03/29/2017  8:51 AM

## 2017-03-29 NOTE — MAU Note (Signed)
Pt reports she started having abd pain this morning. Denies any vag bleeding or discharge. [facility]

## 2017-03-30 LAB — CULTURE, OB URINE

## 2017-04-05 LAB — OB RESULTS CONSOLE RUBELLA ANTIBODY, IGM: Rubella: IMMUNE

## 2017-04-05 LAB — OB RESULTS CONSOLE HGB/HCT, BLOOD
HEMATOCRIT: 35
Hemoglobin: 11.4

## 2017-04-05 LAB — OB RESULTS CONSOLE PLATELET COUNT: PLATELETS: 199

## 2017-04-05 LAB — OB RESULTS CONSOLE ANTIBODY SCREEN: ANTIBODY SCREEN: NEGATIVE

## 2017-04-05 LAB — OB RESULTS CONSOLE GC/CHLAMYDIA
CHLAMYDIA, DNA PROBE: NEGATIVE
Gonorrhea: NEGATIVE

## 2017-04-05 LAB — OB RESULTS CONSOLE HIV ANTIBODY (ROUTINE TESTING): HIV: NONREACTIVE

## 2017-04-05 LAB — OB RESULTS CONSOLE ABO/RH: RH TYPE: POSITIVE

## 2017-04-05 LAB — OB RESULTS CONSOLE HEPATITIS B SURFACE ANTIGEN: Hepatitis B Surface Ag: NEGATIVE

## 2017-04-05 LAB — OB RESULTS CONSOLE RPR: RPR: NONREACTIVE

## 2017-04-06 ENCOUNTER — Other Ambulatory Visit (HOSPITAL_COMMUNITY): Payer: Self-pay | Admitting: Nurse Practitioner

## 2017-04-06 DIAGNOSIS — Z3682 Encounter for antenatal screening for nuchal translucency: Secondary | ICD-10-CM

## 2017-04-09 ENCOUNTER — Ambulatory Visit (INDEPENDENT_AMBULATORY_CARE_PROVIDER_SITE_OTHER): Payer: Self-pay | Admitting: Family Medicine

## 2017-04-09 ENCOUNTER — Encounter: Payer: Self-pay | Admitting: Family Medicine

## 2017-04-09 ENCOUNTER — Encounter: Payer: Self-pay | Admitting: *Deleted

## 2017-04-09 VITALS — BP 106/65 | HR 89 | Wt 98.6 lb

## 2017-04-09 DIAGNOSIS — O0990 Supervision of high risk pregnancy, unspecified, unspecified trimester: Secondary | ICD-10-CM

## 2017-04-09 DIAGNOSIS — O0970 Supervision of high risk pregnancy due to social problems, unspecified trimester: Secondary | ICD-10-CM | POA: Insufficient documentation

## 2017-04-09 DIAGNOSIS — R87810 Cervical high risk human papillomavirus (HPV) DNA test positive: Secondary | ICD-10-CM

## 2017-04-09 DIAGNOSIS — R87610 Atypical squamous cells of undetermined significance on cytologic smear of cervix (ASC-US): Secondary | ICD-10-CM

## 2017-04-09 DIAGNOSIS — O09211 Supervision of pregnancy with history of pre-term labor, first trimester: Secondary | ICD-10-CM

## 2017-04-09 DIAGNOSIS — R8271 Bacteriuria: Secondary | ICD-10-CM

## 2017-04-09 DIAGNOSIS — O0991 Supervision of high risk pregnancy, unspecified, first trimester: Secondary | ICD-10-CM

## 2017-04-09 DIAGNOSIS — Z8751 Personal history of pre-term labor: Secondary | ICD-10-CM

## 2017-04-09 LAB — HM PAP SMEAR: Pap: ABNORMAL — AB

## 2017-04-09 LAB — GLUCOSE TOLERANCE, 1 HOUR: Glucose, 1 Hour GTT: 91

## 2017-04-09 MED ORDER — DOXYLAMINE SUCCINATE (SLEEP) 25 MG PO TABS
25.0000 mg | ORAL_TABLET | Freq: Four times a day (QID) | ORAL | 2 refills | Status: DC | PRN
Start: 2017-04-09 — End: 2017-05-10

## 2017-04-09 MED ORDER — PYRIDOXINE HCL 25 MG PO TABS
25.0000 mg | ORAL_TABLET | Freq: Four times a day (QID) | ORAL | 3 refills | Status: DC | PRN
Start: 2017-04-09 — End: 2017-05-10

## 2017-04-09 NOTE — Progress Notes (Signed)
[facility]

## 2017-04-09 NOTE — Progress Notes (Signed)
[facility] Pen[facility] V 500mg  [facility].  Called [place] [employer] and they state her medicaid is not active at this time.  [facility] and notified them. They stated she has already picked up the penicillin on 03/29/17. And it was only $14 without insurance.

## 2017-04-09 NOTE — Progress Notes (Signed)
Subjective:  Margaret Cain is a [id number] [redacted]w[redacted]d being seen today for her first obstetrical visit.  Her obstetrical history is significant for GDM - diet controlled, h/o preterm delivery. 3 subsequent full term deliveries following, only last one on 17-P.Marland Kitchen Patient does not intend to breast feed. Pregnancy history fully reviewed.  Patient reports no complaints.  BP 106/65   Pulse 89   Wt 98 lb 9.6 oz (44.7 kg)   LMP 02/01/2017   Breastfeeding? Unknown   BMI 18.63 kg/m   HISTORY: OB History  Gravida Para Term Preterm AB Living  8 6 5 1 1 6   SAB TAB Ectopic Multiple Live Births  1 0 0 0 6    # Outcome Date GA Lbr Len/2nd Weight Sex Delivery Anes PTL Lv  8 Current           7 Term 03/19/16 [redacted]w[redacted]d 01:45 / 00:07 7 lb 5.1 oz (3.32 kg) M Vag-Spont EPI  LIV     Birth Comments: none  6 Term 09/08/14 [redacted]w[redacted]d 03:40 / 00:07 6 lb 15.3 oz (3.155 kg) F Vag-Spont None  LIV  5 Term 09/20/13 [redacted]w[redacted]d 04:10 / 00:31 7 lb (3.175 kg) F Vag-Spont EPI  LIV  4 Preterm 07/17/12 [redacted]w[redacted]d 19:05 / 00:12 4 lb 5.7 oz (1.975 kg) M Vag-Spont EPI  LIV  3 SAB 10/05/11          2 Term 10/15/10 [redacted]w[redacted]d  7 lb 2 oz (3.232 kg) M Vag-Spont   LIV  1 Term 2005 [redacted]w[redacted]d  5 lb 7 oz (2.466 kg) M Vag-Spont   LIV      Past Medical History:  Diagnosis Date  . Anemia   . Anxiety   . Depression    ppd with first child  . Gestational diabetes    diet controlled  . Headache(784.0)   . Hx of chlamydia infection   . Positive GBS test 03/18/2017  . Pregnancy induced hypertension   . Preterm labor   . Trichomonosis   . Urinary tract infection   . 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 COLPOSCOPY      Family History  Problem Relation Age of Onset  . Asthma Son   . Asthma Paternal Aunt   . Anesthesia problems Neg Hx   . Hypotension Neg Hx   . Malignant hyperthermia Neg Hx   . Pseudochol deficiency Neg Hx   . Other Neg Hx   . Cancer Neg Hx   . Heart  disease Neg Hx   . Stroke Neg Hx   . Hearing loss Neg Hx      Exam    Uterus:     Pelvic Exam:    Perineum: No Hemorrhoids, Normal Perineum   Vulva: Bartholin's, Urethra, Skene's normal   Vagina:  normal mucosa   Cervix: multiparous appearance, no cervical motion tenderness and no lesions   Adnexa: normal adnexa and no mass, fullness, tenderness   Bony Pelvis: gynecoid  System: Breast:  normal appearance, no masses or tenderness, Inspection negative, No nipple retraction or dimpling, No nipple discharge or bleeding, No axillary or supraclavicular adenopathy   Skin: normal coloration and turgor, no rashes    Neurologic: oriented, normal, gait normal; reflexes normal and symmetric   Extremities: normal strength, tone, and muscle mass   HEENT PERRLA and extra ocular movement intact   Mouth/Teeth mucous membranes moist, pharynx normal without lesions  Neck supple and no masses   Cardiovascular: regular rate and rhythm, no murmurs or gallops   Respiratory:  appears well, vitals normal, no respiratory distress, acyanotic, normal RR, ear and throat exam is normal, neck free of mass or lymphadenopathy, chest clear, no wheezing, crepitations, rhonchi, normal symmetric air entry   Abdomen: soft, non-tender; bowel sounds normal; no masses,  no organomegaly   Urinary: urethral meatus normal      Assessment:    Pregnancy: [id number] Patient Active Problem List   Diagnosis Date Noted  . Supervision of high risk pregnancy, antepartum 04/09/2017  . GBS bacteriuria 03/18/2017  . Anemia affecting pregnancy, antepartum 02/17/2016  . HSIL (high grade squamous intraepithelial lesion) on Pap smear of cervix 10/21/2015  . Pica 10/19/2015  . History of preterm delivery 10/19/2015  . Smoker 12/10/2014      Plan:   1. Supervision of high risk pregnancy, antepartum HR 172 Genetic Screening discussed Quad Screen: requested.  Ultrasound discussed; fetal survey: requested.  Follow up in 4  weeks.   2. History of preterm delivery Discussed 17-P. Pt would like to do 17-P again. From the chart, she was not very compliant with the regimen - stressed importance of weekly injections  3. H/y GDM Early glucola normal   4. ASCUS Colposcopy at next visit  Problem list reviewed and updated. 50% of 30 min visit spent on counseling and coordination of care.     Levie HeritageJacob J Kevyn Wengert 04/09/2017

## 2017-04-10 ENCOUNTER — Encounter: Payer: Self-pay | Admitting: Family Medicine

## 2017-04-16 ENCOUNTER — Encounter: Payer: Self-pay | Admitting: Family Medicine

## 2017-04-20 ENCOUNTER — Encounter: Payer: Self-pay | Admitting: Family Medicine

## 2017-04-28 ENCOUNTER — Encounter: Payer: Self-pay | Admitting: Family Medicine

## 2017-05-02 ENCOUNTER — Ambulatory Visit (HOSPITAL_COMMUNITY)
Admission: RE | Admit: 2017-05-02 | Discharge: 2017-05-02 | Disposition: A | Discharge status: Home / Self Care | Payer: Medicaid Other | Source: Ambulatory Visit | Attending: Nurse Practitioner | Admitting: Nurse Practitioner

## 2017-05-02 ENCOUNTER — Other Ambulatory Visit (HOSPITAL_COMMUNITY): Payer: Self-pay | Admitting: Nurse Practitioner

## 2017-05-02 ENCOUNTER — Encounter (HOSPITAL_COMMUNITY): Payer: Self-pay

## 2017-05-02 DIAGNOSIS — Z3A12 12 weeks gestation of pregnancy: Secondary | ICD-10-CM | POA: Insufficient documentation

## 2017-05-02 DIAGNOSIS — Z8632 Personal history of gestational diabetes: Secondary | ICD-10-CM

## 2017-05-02 DIAGNOSIS — Z3A13 13 weeks gestation of pregnancy: Secondary | ICD-10-CM

## 2017-05-02 DIAGNOSIS — Z3682 Encounter for antenatal screening for nuchal translucency: Secondary | ICD-10-CM | POA: Diagnosis not present

## 2017-05-02 DIAGNOSIS — O09211 Supervision of pregnancy with history of pre-term labor, first trimester: Secondary | ICD-10-CM | POA: Insufficient documentation

## 2017-05-02 DIAGNOSIS — Z8751 Personal history of pre-term labor: Secondary | ICD-10-CM

## 2017-05-02 DIAGNOSIS — O09291 Supervision of pregnancy with other poor reproductive or obstetric history, first trimester: Secondary | ICD-10-CM | POA: Diagnosis not present

## 2017-05-02 DIAGNOSIS — O139 Gestational [pregnancy-induced] hypertension without significant proteinuria, unspecified trimester: Secondary | ICD-10-CM | POA: Insufficient documentation

## 2017-05-02 DIAGNOSIS — Z8759 Personal history of other complications of pregnancy, childbirth and the puerperium: Secondary | ICD-10-CM

## 2017-05-08 ENCOUNTER — Encounter: Payer: Self-pay | Admitting: Family Medicine

## 2017-05-09 ENCOUNTER — Other Ambulatory Visit: Payer: Self-pay | Admitting: General Practice

## 2017-05-09 ENCOUNTER — Encounter: Payer: Self-pay | Admitting: General Practice

## 2017-05-09 DIAGNOSIS — O219 Vomiting of pregnancy, unspecified: Secondary | ICD-10-CM

## 2017-05-09 DIAGNOSIS — G43809 Other migraine, not intractable, without status migrainosus: Secondary | ICD-10-CM

## 2017-05-09 MED ORDER — DOXYLAMINE-PYRIDOXINE 10-10 MG PO TBEC: DELAYED_RELEASE_TABLET | ORAL | 6 refills | Status: DC

## 2017-05-10 ENCOUNTER — Encounter: Payer: Self-pay | Admitting: Family Medicine

## 2017-05-10 ENCOUNTER — Ambulatory Visit (INDEPENDENT_AMBULATORY_CARE_PROVIDER_SITE_OTHER): Payer: Medicaid Other | Admitting: Family Medicine

## 2017-05-10 VITALS — BP 107/69 | HR 87 | Wt 104.9 lb

## 2017-05-10 DIAGNOSIS — Z8751 Personal history of pre-term labor: Secondary | ICD-10-CM

## 2017-05-10 DIAGNOSIS — R87810 Cervical high risk human papillomavirus (HPV) DNA test positive: Secondary | ICD-10-CM

## 2017-05-10 DIAGNOSIS — R87610 Atypical squamous cells of undetermined significance on cytologic smear of cervix (ASC-US): Secondary | ICD-10-CM

## 2017-05-10 DIAGNOSIS — G43809 Other migraine, not intractable, without status migrainosus: Secondary | ICD-10-CM

## 2017-05-10 DIAGNOSIS — O0992 Supervision of high risk pregnancy, unspecified, second trimester: Secondary | ICD-10-CM

## 2017-05-10 DIAGNOSIS — O09212 Supervision of pregnancy with history of pre-term labor, second trimester: Secondary | ICD-10-CM

## 2017-05-10 DIAGNOSIS — O0990 Supervision of high risk pregnancy, unspecified, unspecified trimester: Secondary | ICD-10-CM

## 2017-05-10 MED ORDER — CYCLOBENZAPRINE HCL 10 MG PO TABS: 10.0000 mg | ORAL_TABLET | Freq: Three times a day (TID) | ORAL | 3 refills | Status: DC | PRN

## 2017-05-10 NOTE — Progress Notes (Signed)
    PRENATAL VISIT NOTE  Subjective:  Donice Cline CoolsM Mackel is a 31 y.o. [id number] at 6677w0d being seen today for ongoing prenatal care.  She is currently monitored for the following issues for this high-risk pregnancy and has Smoker; Pica; History of preterm delivery; ASCUS with positive high risk HPV cervical; Anemia affecting pregnancy, antepartum; GBS bacteriuria; Supervision of high risk pregnancy, antepartum; and Pregnancy induced hypertension on her problem list.  Patient reports migraines 3-4 times a week. + photo/phonophobia. Gets n/v with the migraines. .  Contractions: Not present. Vag. Bleeding: None.  Movement: Present. Denies leaking of fluid.   The following portions of the patient's history were reviewed and updated as appropriate: allergies, current medications, past family history, past medical history, past social history, past surgical history and problem list. Problem list updated.  Objective:   Vitals:   05/10/17 1331  BP: 107/69  Pulse: 87  Weight: 104 lb 14.4 oz (47.6 kg)    Fetal Status: Fetal Heart Rate (bpm): 145   Movement: Present     General:  Alert, oriented and cooperative. Patient is in no acute distress.  Skin: Skin is warm and dry. No rash noted.   Cardiovascular: Normal heart rate noted  Respiratory: Normal respiratory effort, no problems with respiration noted  Abdomen: Soft, gravid, appropriate for gestational age.  Pain/Pressure: Present     Pelvic: Cervical exam deferred        Extremities: Normal range of motion.  Edema: None  Mental Status:  Normal mood and affect. Normal behavior. Normal judgment and thought content.   Assessment and Plan:  Pregnancy: [id number] at 8177w0d  1. Supervision of high risk pregnancy, antepartum FHT and FH normal  2. History of preterm delivery 17-P at 16wk  3. ASCUS with positive high risk HPV cervical Colpo next appt  4. Other migraine without status migrainosus, not intractable flexeril  Preterm labor symptoms  and general obstetric precautions including but not limited to vaginal bleeding, contractions, leaking of fluid and fetal movement were reviewed in detail with the patient. Please refer to After Visit Summary for other counseling recommendations.  Return in about 4 weeks (around 06/07/2017) for HR OB f/u, Colposcopy.   Levie HeritageJacob J Stinson, DO

## 2017-05-10 NOTE — Patient Instructions (Addendum)

## 2017-05-15 ENCOUNTER — Encounter: Payer: Self-pay | Admitting: General Practice

## 2017-05-16 ENCOUNTER — Encounter: Payer: Self-pay | Admitting: Family Medicine

## 2017-05-17 ENCOUNTER — Encounter: Payer: Self-pay | Admitting: Neurology

## 2017-05-27 ENCOUNTER — Encounter (HOSPITAL_COMMUNITY): Payer: Self-pay

## 2017-05-27 ENCOUNTER — Inpatient Hospital Stay (HOSPITAL_COMMUNITY)
Admission: AD | Admit: 2017-05-27 | Discharge: 2017-05-27 | Disposition: A | Discharge status: Home / Self Care | Payer: Medicaid Other | Source: Ambulatory Visit | Attending: Family Medicine | Admitting: Family Medicine

## 2017-05-27 DIAGNOSIS — F329 Major depressive disorder, single episode, unspecified: Secondary | ICD-10-CM | POA: Diagnosis not present

## 2017-05-27 DIAGNOSIS — R1030 Lower abdominal pain, unspecified: Secondary | ICD-10-CM | POA: Insufficient documentation

## 2017-05-27 DIAGNOSIS — Z87440 Personal history of urinary (tract) infections: Secondary | ICD-10-CM | POA: Insufficient documentation

## 2017-05-27 DIAGNOSIS — O26899 Other specified pregnancy related conditions, unspecified trimester: Secondary | ICD-10-CM

## 2017-05-27 DIAGNOSIS — O99342 Other mental disorders complicating pregnancy, second trimester: Secondary | ICD-10-CM | POA: Insufficient documentation

## 2017-05-27 DIAGNOSIS — R102 Pelvic and perineal pain: Secondary | ICD-10-CM | POA: Diagnosis not present

## 2017-05-27 DIAGNOSIS — O26892 Other specified pregnancy related conditions, second trimester: Secondary | ICD-10-CM | POA: Insufficient documentation

## 2017-05-27 DIAGNOSIS — Z3492 Encounter for supervision of normal pregnancy, unspecified, second trimester: Secondary | ICD-10-CM

## 2017-05-27 DIAGNOSIS — Z3A16 16 weeks gestation of pregnancy: Secondary | ICD-10-CM | POA: Insufficient documentation

## 2017-05-27 DIAGNOSIS — O99012 Anemia complicating pregnancy, second trimester: Secondary | ICD-10-CM | POA: Diagnosis not present

## 2017-05-27 DIAGNOSIS — F419 Anxiety disorder, unspecified: Secondary | ICD-10-CM | POA: Insufficient documentation

## 2017-05-27 DIAGNOSIS — O132 Gestational [pregnancy-induced] hypertension without significant proteinuria, second trimester: Secondary | ICD-10-CM | POA: Insufficient documentation

## 2017-05-27 DIAGNOSIS — O24410 Gestational diabetes mellitus in pregnancy, diet controlled: Secondary | ICD-10-CM | POA: Insufficient documentation

## 2017-05-27 LAB — URINALYSIS, ROUTINE W REFLEX MICROSCOPIC
BILIRUBIN URINE: NEGATIVE
Glucose, UA: NEGATIVE mg/dL
Hgb urine dipstick: NEGATIVE
Ketones, ur: NEGATIVE mg/dL
Nitrite: NEGATIVE
Protein, ur: NEGATIVE mg/dL
SPECIFIC GRAVITY, URINE: 1.016 (ref 1.005–1.030)
pH: 6 (ref 5.0–8.0)

## 2017-05-27 MED ORDER — COMFORT FIT MATERNITY SUPP SM MISC: 1.0000 [IU] | Freq: Every day | 0 refills | Status: DC | PRN

## 2017-05-27 NOTE — MAU Note (Signed)
Patient presents to MAU with c/o abdominal cramping that began last night around 2000. States she feels some "bulging" as well. Denies VB, discharge, and LOF.

## 2017-05-27 NOTE — Discharge Instructions (Signed)

## 2017-05-27 NOTE — MAU Provider Note (Signed)
History     CSN: [id number]  Arrival date and time: 05/27/17 [id number]  First Provider Initiated Contact with Patient 05/27/17 [phone]      Chief Complaint  Patient presents with  . Abdominal Pain   HPI Margaret Cain is a 31 y.o. [id number] at 5745w3d who presents with abdominal pain. Reports intermittent lower abdominal pain since last night. Can't tell how frequently. Describes as sharp lower abdominal pain & "bulging/tightening". Rates pain 6/10. Has not treated. Position changes make pain worse. Denies n/v/d, constipation, dysuria, vaginal discharge, LOF, or vaginal bleeding.   OB History    Gravida Para Term Preterm AB Living   8 6 5 1 1 6    SAB TAB Ectopic Multiple Live Births   1 0 0 0 6      Past Medical History:  Diagnosis Date  . Anemia   . Anxiety   . Depression    ppd with first child  . Gestational diabetes    diet controlled  . Headache(784.0)   . Hx of chlamydia infection   . Positive GBS test 03/18/2017  . Pregnancy induced hypertension   . Preterm labor   . Trichomonosis   . Urinary tract infection   . 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 COLPOSCOPY      Family History  Problem Relation Age of Onset  . Asthma Son   . Asthma Paternal Aunt   . Anesthesia problems Neg Hx   . Hypotension Neg Hx   . Malignant hyperthermia Neg Hx   . Pseudochol deficiency Neg Hx   . Other Neg Hx   . Cancer Neg Hx   . Heart disease Neg Hx   . Stroke Neg Hx   . Hearing loss Neg Hx     Social History  Substance Use Topics  . Smoking status: Never Smoker  . Smokeless tobacco: Never Used     Comment: Pt reports she has never smoked  . Alcohol use No    Allergies: No Known Allergies  Prescriptions Prior to Admission  Medication Sig Dispense Refill Last Dose  . cyclobenzaprine (FLEXERIL) 10 MG tablet Take 1 tablet (10 mg total) by mouth every 8 (eight) hours as needed (migraine). 30 tablet 3    . Doxylamine-Pyridoxine (DICLEGIS) 10-10 MG TBEC Take 2 tabs at bedtime. May take 1 tab at breakfast if needed & may add 1 tab to lunch if needed 100 tablet 6 Taking  . Prenatal Vit-Fe Fumarate-FA (PREPLUS) 27-1 MG TABS Take 1 tablet by mouth daily. 30 tablet 12 Taking    Review of Systems  Constitutional: Negative.   Gastrointestinal: Positive for abdominal pain. Negative for constipation, diarrhea, nausea and vomiting.  Genitourinary: Negative for dysuria, vaginal bleeding and vaginal discharge.   Physical Exam   Blood pressure 104/63, pulse 92, temperature 98.3 F (36.8 C), temperature source Oral, resp. rate 16, height 5\' 1"  (1.549 m), weight 106 lb 4 oz (48.2 kg), last menstrual period 02/01/2017, SpO2 100 %, unknown if currently breastfeeding.  Physical Exam  Nursing note and vitals reviewed. Constitutional: She is oriented to person, place, and time. She appears well-developed and well-nourished. No distress.  HENT:  Head: Normocephalic and atraumatic.  Eyes: Conjunctivae are normal. Right eye exhibits no discharge. Left eye exhibits no discharge. No scleral icterus.  Neck: Normal range of motion.  Cardiovascular: Normal rate, regular rhythm and normal heart sounds.   No  murmur heard. Respiratory: Effort normal and breath sounds normal. No respiratory distress. She has no wheezes.  GI: Soft. Bowel sounds are normal. There is no tenderness.  Genitourinary:  Genitourinary Comments: Cervix closed/thick  Neurological: She is alert and oriented to person, place, and time.  Skin: Skin is warm and dry. She is not diaphoretic.  Psychiatric: She has a normal mood and affect. Her behavior is normal. Judgment and thought content normal.    MAU Course  Procedures Results for orders placed or performed during the hospital encounter of 05/27/17 (from the past 24 hour(s))  Urinalysis, Routine w reflex microscopic     Status: Abnormal   Collection Time: 05/27/17  6:59 AM  Result Value  Ref Range   Color, Urine YELLOW YELLOW   APPearance HAZY (A) CLEAR   Specific Gravity, Urine 1.016 1.005 - 1.030   pH 6.0 5.0 - 8.0   Glucose, UA NEGATIVE NEGATIVE mg/dL   Hgb urine dipstick NEGATIVE NEGATIVE   Bilirubin Urine NEGATIVE NEGATIVE   Ketones, ur NEGATIVE NEGATIVE mg/dL   Protein, ur NEGATIVE NEGATIVE mg/dL   Nitrite NEGATIVE NEGATIVE   Leukocytes, UA SMALL (A) NEGATIVE   RBC / HPF 0-5 0 - 5 RBC/hpf   WBC, UA 6-30 0 - 5 WBC/hpf   Bacteria, UA RARE (A) NONE SEEN   Squamous Epithelial / LPF 0-5 (A) NONE SEEN   Mucous PRESENT     MDM FHT 141 Cervix closed/thick NAD, VSS Assessment and Plan  A: 1. Pain of round ligament complicating pregnancy, antepartum   2. Fetal heart tones present, second trimester    P: Discharge home Rx maternity support belt  Discussed reasons to return to MAU Keep follow up appointment with OB/PCP  Urine culture pending   Judeth Horn 05/27/2017, 7:25 AM

## 2017-05-28 ENCOUNTER — Encounter (HOSPITAL_COMMUNITY): Payer: Self-pay | Admitting: *Deleted

## 2017-05-28 ENCOUNTER — Inpatient Hospital Stay (HOSPITAL_COMMUNITY)
Admission: AD | Admit: 2017-05-28 | Discharge: 2017-05-28 | Disposition: A | Discharge status: Home / Self Care | Payer: Medicaid Other | Source: Ambulatory Visit | Attending: Obstetrics & Gynecology | Admitting: Obstetrics & Gynecology

## 2017-05-28 DIAGNOSIS — O99352 Diseases of the nervous system complicating pregnancy, second trimester: Secondary | ICD-10-CM | POA: Insufficient documentation

## 2017-05-28 DIAGNOSIS — M549 Dorsalgia, unspecified: Secondary | ICD-10-CM | POA: Insufficient documentation

## 2017-05-28 DIAGNOSIS — R509 Fever, unspecified: Secondary | ICD-10-CM | POA: Diagnosis not present

## 2017-05-28 DIAGNOSIS — O26892 Other specified pregnancy related conditions, second trimester: Secondary | ICD-10-CM | POA: Diagnosis not present

## 2017-05-28 DIAGNOSIS — O9989 Other specified diseases and conditions complicating pregnancy, childbirth and the puerperium: Secondary | ICD-10-CM

## 2017-05-28 DIAGNOSIS — G43909 Migraine, unspecified, not intractable, without status migrainosus: Secondary | ICD-10-CM

## 2017-05-28 DIAGNOSIS — F419 Anxiety disorder, unspecified: Secondary | ICD-10-CM | POA: Diagnosis not present

## 2017-05-28 DIAGNOSIS — O99342 Other mental disorders complicating pregnancy, second trimester: Secondary | ICD-10-CM | POA: Insufficient documentation

## 2017-05-28 DIAGNOSIS — O99891 Other specified diseases and conditions complicating pregnancy: Secondary | ICD-10-CM

## 2017-05-28 DIAGNOSIS — F329 Major depressive disorder, single episode, unspecified: Secondary | ICD-10-CM | POA: Diagnosis not present

## 2017-05-28 DIAGNOSIS — Z825 Family history of asthma and other chronic lower respiratory diseases: Secondary | ICD-10-CM | POA: Diagnosis not present

## 2017-05-28 DIAGNOSIS — Z79899 Other long term (current) drug therapy: Secondary | ICD-10-CM | POA: Diagnosis not present

## 2017-05-28 DIAGNOSIS — Z3A16 16 weeks gestation of pregnancy: Secondary | ICD-10-CM

## 2017-05-28 LAB — COMPREHENSIVE METABOLIC PANEL
ALBUMIN: 3.1 g/dL — AB (ref 3.5–5.0)
ALT: 9 U/L — AB (ref 14–54)
AST: 16 U/L (ref 15–41)
Alkaline Phosphatase: 45 U/L (ref 38–126)
Anion gap: 10 (ref 5–15)
BILIRUBIN TOTAL: 0.7 mg/dL (ref 0.3–1.2)
CO2: 20 mmol/L — ABNORMAL LOW (ref 22–32)
CREATININE: 0.52 mg/dL (ref 0.44–1.00)
Calcium: 8.7 mg/dL — ABNORMAL LOW (ref 8.9–10.3)
Chloride: 104 mmol/L (ref 101–111)
GFR calc Af Amer: >60 mL/min (ref >60)
GFR calc non Af Amer: >60 mL/min (ref >60)
GLUCOSE: 82 mg/dL (ref 65–99)
POTASSIUM: 3.5 mmol/L (ref 3.5–5.1)
Sodium: 134 mmol/L — ABNORMAL LOW (ref 135–145)
TOTAL PROTEIN: 6.4 g/dL — AB (ref 6.5–8.1)

## 2017-05-28 LAB — CBC
HEMATOCRIT: 30.2 % — AB (ref 36.0–46.0)
HEMOGLOBIN: 10.3 g/dL — AB (ref 12.0–15.0)
MCH: 29 pg (ref 26.0–34.0)
MCHC: 34.1 g/dL (ref 30.0–36.0)
MCV: 85.1 fL (ref 78.0–100.0)
Platelets: 139 10*3/uL — ABNORMAL LOW (ref 150–400)
RBC: 3.55 MIL/uL — ABNORMAL LOW (ref 3.87–5.11)
RDW: 13.2 % (ref 11.5–15.5)
WBC: 6 10*3/uL (ref 4.0–10.5)

## 2017-05-28 LAB — URINALYSIS, ROUTINE W REFLEX MICROSCOPIC
Bilirubin Urine: NEGATIVE
GLUCOSE, UA: NEGATIVE mg/dL
HGB URINE DIPSTICK: NEGATIVE
Ketones, ur: 20 mg/dL — AB
LEUKOCYTES UA: NEGATIVE
Nitrite: NEGATIVE
PH: 6 (ref 5.0–8.0)
Protein, ur: NEGATIVE mg/dL
Specific Gravity, Urine: 1.024 (ref 1.005–1.030)

## 2017-05-28 MED ORDER — LACTATED RINGERS IV BOLUS (SEPSIS)
1000.0000 mL | Freq: Once | INTRAVENOUS | Status: AC
  Administered 2017-05-28: 1000 mL via INTRAVENOUS

## 2017-05-28 MED ORDER — DEXAMETHASONE SODIUM PHOSPHATE 10 MG/ML IJ SOLN
10.0000 mg | Freq: Once | INTRAMUSCULAR | Status: AC
  Administered 2017-05-28: 10 mg via INTRAVENOUS
  Filled 2017-05-28: qty 1

## 2017-05-28 MED ORDER — DIPHENHYDRAMINE HCL 50 MG/ML IJ SOLN
25.0000 mg | Freq: Once | INTRAMUSCULAR | Status: AC
  Administered 2017-05-28: 25 mg via INTRAVENOUS
  Filled 2017-05-28: qty 1

## 2017-05-28 MED ORDER — CYCLOBENZAPRINE HCL 10 MG PO TABS: 10.0000 mg | ORAL_TABLET | Freq: Three times a day (TID) | ORAL | 2 refills | Status: DC | PRN

## 2017-05-28 MED ORDER — IBUPROFEN 600 MG PO TABS
600.0000 mg | ORAL_TABLET | Freq: Once | ORAL | Status: AC
  Administered 2017-05-28: 600 mg via ORAL
  Filled 2017-05-28: qty 1

## 2017-05-28 MED ORDER — PROMETHAZINE HCL 25 MG/ML IJ SOLN
25.0000 mg | Freq: Once | INTRAMUSCULAR | Status: AC
  Administered 2017-05-28: 25 mg via INTRAVENOUS
  Filled 2017-05-28: qty 1

## 2017-05-28 NOTE — MAU Provider Note (Signed)
History     CSN: [id number]  Arrival date and time: 05/28/17 0700   First Provider Initiated Contact with Patient 05/28/17 [phone]      Chief Complaint  Patient presents with  . Headache  . Back Pain   Margaret Cain is a 31 y.o. [id number] at 107w4d who presents today with fever, headache, and backache. She states that she took fioricet around 2100 last night. She reports that she did not get any relief from that medication. She reports an oral temp of 101 last night as well. She has not taken any other medications for fever of pain today. She has had hx of migraines.    Headache   This is a new problem. The current episode started yesterday. The problem occurs constantly. The problem has been unchanged. The pain is located in the frontal region. The pain does not radiate. The pain quality is similar to prior headaches. The pain is at a severity of 7/10. Associated symptoms include back pain and a fever. Pertinent negatives include no nausea or vomiting. The symptoms are aggravated by bright light and noise. Treatments tried: fioricet  The treatment provided no relief.  Back Pain  This is a new problem. The current episode started yesterday. The problem occurs constantly. The problem is unchanged. The pain is present in the sacro-iliac. The pain is at a severity of 7/10. Associated symptoms include a fever and headaches. Pertinent negatives include no dysuria or pelvic pain. She has tried nothing for the symptoms.     Past Medical History:  Diagnosis Date  . Anemia   . Anxiety   . Depression    ppd with first child  . Gestational diabetes    diet controlled  . Headache(784.0)   . Hx of chlamydia infection   . Positive GBS test 03/18/2017  . Pregnancy induced hypertension   . Preterm labor   . Trichomonosis   . Urinary tract infection   . 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 COLPOSCOPY      Family  History  Problem Relation Age of Onset  . Asthma Son   . Asthma Paternal Aunt   . Anesthesia problems Neg Hx   . Hypotension Neg Hx   . Malignant hyperthermia Neg Hx   . Pseudochol deficiency Neg Hx   . Other Neg Hx   . Cancer Neg Hx   . Heart disease Neg Hx   . Stroke Neg Hx   . Hearing loss Neg Hx     Social History  Substance Use Topics  . Smoking status: Never Smoker  . Smokeless tobacco: Never Used     Comment: Pt reports she has never smoked  . Alcohol use No    Allergies: No Known Allergies  Prescriptions Prior to Admission  Medication Sig Dispense Refill Last Dose  . acetaminophen (TYLENOL) 500 MG tablet Take 500 mg by mouth every 6 (six) hours as needed for mild pain or headache.   Past Week at Unknown time  . cyclobenzaprine (FLEXERIL) 10 MG tablet Take 1 tablet (10 mg total) by mouth every 8 (eight) hours as needed (migraine). 30 tablet 3 Past Week at Unknown time  . Prenatal Vit-Fe Phos-FA-Omega (VITAFOL GUMMIES) 3.33-0.333-34.8 MG CHEW Chew 1 tablet by mouth daily.  38 Past Week at Unknown time  . Doxylamine-Pyridoxine (DICLEGIS) 10-10 MG TBEC Take 2 tabs at bedtime. May take 1 tab at  breakfast if needed & may add 1 tab to lunch if needed 100 tablet 6 prn  . Elastic Bandages & Supports (COMFORT FIT MATERNITY SUPP SM) MISC 1 Units by Does not apply route daily as needed. 1 each 0     Review of Systems  Constitutional: Positive for fever. Negative for chills.  Gastrointestinal: Negative for nausea and vomiting.  Genitourinary: Negative for dysuria, frequency, pelvic pain, urgency, vaginal bleeding and vaginal discharge.  Musculoskeletal: Positive for back pain.  Neurological: Positive for headaches.   Physical Exam   Blood pressure 109/65, pulse (!) 116, temperature 98.5 F (36.9 C), temperature source Oral, resp. rate 14, last menstrual period 02/01/2017, unknown if currently breastfeeding.  Physical Exam  Nursing note and vitals reviewed. Constitutional:  She is oriented to person, place, and time. She appears well-developed and well-nourished. No distress.  HENT:  Head: Normocephalic.  Cardiovascular: Normal rate.   Respiratory: Effort normal.  GI: Soft. There is no tenderness. There is no rebound.  Genitourinary:  Genitourinary Comments: Cervix: closed/thick  FHT: 150 with doppler   Neurological: She is alert and oriented to person, place, and time.  Skin: Skin is warm and dry.  Psychiatric: She has a normal mood and affect.   Results for orders placed or performed during the hospital encounter of 05/28/17 (from the past 24 hour(s))  Urinalysis, Routine w reflex microscopic     Status: Abnormal   Collection Time: 05/28/17  7:00 AM  Result Value Ref Range   Color, Urine YELLOW YELLOW   APPearance HAZY (A) CLEAR   Specific Gravity, Urine 1.024 1.005 - 1.030   pH 6.0 5.0 - 8.0   Glucose, UA NEGATIVE NEGATIVE mg/dL   Hgb urine dipstick NEGATIVE NEGATIVE   Bilirubin Urine NEGATIVE NEGATIVE   Ketones, ur 20 (A) NEGATIVE mg/dL   Protein, ur NEGATIVE NEGATIVE mg/dL   Nitrite NEGATIVE NEGATIVE   Leukocytes, UA NEGATIVE NEGATIVE  CBC     Status: Abnormal   Collection Time: 05/28/17  8:31 AM  Result Value Ref Range   WBC 6.0 4.0 - 10.5 K/uL   RBC 3.55 (L) 3.87 - 5.11 MIL/uL   Hemoglobin 10.3 (L) 12.0 - 15.0 g/dL   HCT 16.1 (L) 09.6 - 04.5 %   MCV 85.1 78.0 - 100.0 fL   MCH 29.0 26.0 - 34.0 pg   MCHC 34.1 30.0 - 36.0 g/dL   RDW 40.9 81.1 - 91.4 %   Platelets 139 (L) 150 - 400 K/uL  Comprehensive metabolic panel     Status: Abnormal   Collection Time: 05/28/17  8:31 AM  Result Value Ref Range   Sodium 134 (L) 135 - 145 mmol/L   Potassium 3.5 3.5 - 5.1 mmol/L   Chloride 104 101 - 111 mmol/L   CO2 20 (L) 22 - 32 mmol/L   Glucose, Bld 82 65 - 99 mg/dL   BUN <5 (L) 6 - 20 mg/dL   Creatinine, Ser 7.82 0.44 - 1.00 mg/dL   Calcium 8.7 (L) 8.9 - 10.3 mg/dL   Total Protein 6.4 (L) 6.5 - 8.1 g/dL   Albumin 3.1 (L) 3.5 - 5.0 g/dL   AST  16 15 - 41 U/L   ALT 9 (L) 14 - 54 U/L   Alkaline Phosphatase 45 38 - 126 U/L   Total Bilirubin 0.7 0.3 - 1.2 mg/dL   GFR calc non Af Amer >60 >60 mL/min   GFR calc Af Amer >60 >60 mL/min   Anion gap 10 5 -  15     MAU Course  Procedures  MDM Patient has had 1L or LR and headache cocktail (decadron, phenergan and benadryl). She reports that her backache is now 0/10, but headache is still 6/10. Will give one dose of Ibuprofen.   Assessment and Plan   1. Back pain affecting pregnancy in second trimester   2. Migraine without status migrainosus, not intractable, unspecified migraine type   3. [redacted] weeks gestation of pregnancy    DC home Comfort measures reviewed  2nd Trimester precautions  RX: flexeril PRN #30 with 2 RF  Return to MAU as needed FU with OB as planned Repeat platelets at next OB visit  Follow-up Information    [facility].   Specialty:  Obstetrics and Gynecology Contact information: [address] [place] [id number] [phone]           Thressa ShellerHeather Krysten Veronica 05/28/2017, 11:54 AM

## 2017-05-28 NOTE — MAU Note (Signed)
Patient reports fever at home; started last night. States "it felt about 101-102" Denies taking any tylenol or fever reducer.   Denies N/V/D.  +lower mid back pain Rating pain 7/10 Constant pain Cramping in nature Did take one of her migraine medications for the pain; unsure of medication name.

## 2017-05-28 NOTE — Discharge Instructions (Signed)

## 2017-05-29 LAB — CULTURE, OB URINE

## 2017-05-30 ENCOUNTER — Telehealth: Payer: Self-pay | Admitting: Student

## 2017-05-30 NOTE — Telephone Encounter (Signed)
Attempted to contact patient using phone number on file. Message received that it is not a valid number. Patient needs to be tx for GBS uti.

## 2017-06-01 ENCOUNTER — Other Ambulatory Visit: Payer: Self-pay

## 2017-06-04 ENCOUNTER — Encounter: Payer: Self-pay | Admitting: General Practice

## 2017-06-04 NOTE — Progress Notes (Signed)
Patient hasn't been using baby scripts regularly, non compliant. mychart message sent to see if patient is having difficulty with program

## 2017-06-06 ENCOUNTER — Other Ambulatory Visit: Payer: Self-pay | Admitting: Medical

## 2017-06-06 ENCOUNTER — Other Ambulatory Visit: Payer: Self-pay | Admitting: General Practice

## 2017-06-06 DIAGNOSIS — B951 Streptococcus, group B, as the cause of diseases classified elsewhere: Secondary | ICD-10-CM

## 2017-06-06 DIAGNOSIS — O2342 Unspecified infection of urinary tract in pregnancy, second trimester: Principal | ICD-10-CM

## 2017-06-06 MED ORDER — AMOXICILLIN 500 MG PO CAPS: 500.0000 mg | ORAL_CAPSULE | Freq: Three times a day (TID) | ORAL | 0 refills | Status: AC

## 2017-06-08 ENCOUNTER — Other Ambulatory Visit: Payer: Self-pay | Admitting: Family Medicine

## 2017-06-08 ENCOUNTER — Other Ambulatory Visit (HOSPITAL_COMMUNITY): Payer: Self-pay | Admitting: *Deleted

## 2017-06-08 ENCOUNTER — Encounter (HOSPITAL_COMMUNITY): Payer: Self-pay

## 2017-06-08 ENCOUNTER — Ambulatory Visit (HOSPITAL_COMMUNITY)
Admission: RE | Admit: 2017-06-08 | Discharge: 2017-06-08 | Disposition: A | Discharge status: Home / Self Care | Payer: Medicaid Other | Source: Ambulatory Visit | Attending: Family Medicine | Admitting: Family Medicine

## 2017-06-08 ENCOUNTER — Ambulatory Visit (INDEPENDENT_AMBULATORY_CARE_PROVIDER_SITE_OTHER): Payer: Medicaid Other | Admitting: Obstetrics and Gynecology

## 2017-06-08 VITALS — BP 117/65 | HR 85 | Wt 108.1 lb

## 2017-06-08 DIAGNOSIS — R8271 Bacteriuria: Secondary | ICD-10-CM

## 2017-06-08 DIAGNOSIS — Z3A18 18 weeks gestation of pregnancy: Secondary | ICD-10-CM

## 2017-06-08 DIAGNOSIS — O09299 Supervision of pregnancy with other poor reproductive or obstetric history, unspecified trimester: Secondary | ICD-10-CM | POA: Diagnosis not present

## 2017-06-08 DIAGNOSIS — O0990 Supervision of high risk pregnancy, unspecified, unspecified trimester: Secondary | ICD-10-CM

## 2017-06-08 DIAGNOSIS — Z363 Encounter for antenatal screening for malformations: Secondary | ICD-10-CM | POA: Insufficient documentation

## 2017-06-08 DIAGNOSIS — O09212 Supervision of pregnancy with history of pre-term labor, second trimester: Secondary | ICD-10-CM

## 2017-06-08 DIAGNOSIS — Z8751 Personal history of pre-term labor: Secondary | ICD-10-CM

## 2017-06-08 DIAGNOSIS — R87810 Cervical high risk human papillomavirus (HPV) DNA test positive: Secondary | ICD-10-CM

## 2017-06-08 DIAGNOSIS — O99332 Smoking (tobacco) complicating pregnancy, second trimester: Secondary | ICD-10-CM | POA: Diagnosis not present

## 2017-06-08 DIAGNOSIS — G43909 Migraine, unspecified, not intractable, without status migrainosus: Secondary | ICD-10-CM | POA: Diagnosis not present

## 2017-06-08 DIAGNOSIS — O0992 Supervision of high risk pregnancy, unspecified, second trimester: Secondary | ICD-10-CM | POA: Diagnosis present

## 2017-06-08 DIAGNOSIS — IMO0002 Reserved for concepts with insufficient information to code with codable children: Secondary | ICD-10-CM

## 2017-06-08 DIAGNOSIS — R87610 Atypical squamous cells of undetermined significance on cytologic smear of cervix (ASC-US): Secondary | ICD-10-CM | POA: Diagnosis not present

## 2017-06-08 DIAGNOSIS — Z0489 Encounter for examination and observation for other specified reasons: Secondary | ICD-10-CM

## 2017-06-08 DIAGNOSIS — Z8632 Personal history of gestational diabetes: Secondary | ICD-10-CM

## 2017-06-08 DIAGNOSIS — O09219 Supervision of pregnancy with history of pre-term labor, unspecified trimester: Secondary | ICD-10-CM | POA: Diagnosis not present

## 2017-06-08 MED ORDER — HYDROXYPROGESTERONE CAPROATE 250 MG/ML IM OIL
250.0000 mg | TOPICAL_OIL | Freq: Once | INTRAMUSCULAR | Status: AC
  Administered 2017-06-08: 250 mg via INTRAMUSCULAR

## 2017-06-08 NOTE — Progress Notes (Signed)
Colop Note  ASCUS with HR HPV 5/18  Informed consent obtained. Procedure and indications reviewed with pt  Pt placed in DL position. Speculum placed. Cervix cleaned with acetic acid. TZ seen. No AW or lesions noted. Procedure completed. Pt tolerated well. F/U pap smear postpartum

## 2017-06-08 NOTE — Progress Notes (Signed)
Subjective:  Margaret Cain is a 31 y.o. [id number] at [redacted]w[redacted]d being seen today for ongoing prenatal care.  She is currently monitored for the following issues for this high-risk pregnancy and has Smoker; Pica; History of preterm delivery; ASCUS with positive high risk HPV cervical; Anemia affecting pregnancy, antepartum; GBS bacteriuria; Supervision of high risk pregnancy, antepartum; History of gestational diabetes; and Migraine headache on her problem list.  Patient reports no complaints.  Contractions: Irritability. Vag. Bleeding: None.  Movement: Present. Denies leaking of fluid.   The following portions of the patient's history were reviewed and updated as appropriate: allergies, current medications, past family history, past medical history, past social history, past surgical history and problem list. Problem list updated.  Objective:   Vitals:   06/08/17 0821  BP: 117/65  Pulse: 85  Weight: 108 lb 1.6 oz (49 kg)    Fetal Status: Fetal Heart Rate (bpm): 147   Movement: Present     General:  Alert, oriented and cooperative. Patient is in no acute distress.  Skin: Skin is warm and dry. No rash noted.   Cardiovascular: Normal heart rate noted  Respiratory: Normal respiratory effort, no problems with respiration noted  Abdomen: Soft, gravid, appropriate for gestational age. Pain/Pressure: Absent     Pelvic:  Cervical exam deferred        Extremities: Normal range of motion.  Edema: None  Mental Status: Normal mood and affect. Normal behavior. Normal judgment and thought content.   Urinalysis:      Assessment and Plan:  Pregnancy: [id number] at [redacted]w[redacted]d  1. Supervision of high risk pregnancy, antepartum Stable Anatomy scan today  2. History of preterm delivery Starting 4 OHP today Importance of coming weekly for injections discussed with pt  3. GBS bacteriuria Will Tx in labor  4. History of gestational diabetes Normal early glucola  5. Migraine without status migrainosus, not  intractable, unspecified migraine type Has been referred to Neurology  6. ASCUS with positive high risk HPV cervical Colpo today  Preterm labor symptoms and general obstetric precautions including but not limited to vaginal bleeding, contractions, leaking of fluid and fetal movement were reviewed in detail with the patient. Please refer to After Visit Summary for other counseling recommendations.  Return in about 4 weeks (around 07/06/2017) for OB visit.   Hermina Staggers, MD

## 2017-06-08 NOTE — Patient Instructions (Signed)

## 2017-06-08 NOTE — Addendum Note (Signed)
Addended by: Clearnce Sorrel on: 06/08/2017 09:19 AM   Modules accepted: Orders

## 2017-06-12 ENCOUNTER — Encounter (HOSPITAL_COMMUNITY): Payer: Self-pay | Admitting: *Deleted

## 2017-06-12 ENCOUNTER — Inpatient Hospital Stay (HOSPITAL_COMMUNITY)
Admission: AD | Admit: 2017-06-12 | Discharge: 2017-06-12 | Disposition: A | Discharge status: Home / Self Care | Payer: Medicaid Other | Source: Ambulatory Visit | Attending: Obstetrics & Gynecology | Admitting: Obstetrics & Gynecology

## 2017-06-12 DIAGNOSIS — O99342 Other mental disorders complicating pregnancy, second trimester: Secondary | ICD-10-CM | POA: Insufficient documentation

## 2017-06-12 DIAGNOSIS — O9989 Other specified diseases and conditions complicating pregnancy, childbirth and the puerperium: Secondary | ICD-10-CM

## 2017-06-12 DIAGNOSIS — O26892 Other specified pregnancy related conditions, second trimester: Secondary | ICD-10-CM | POA: Diagnosis not present

## 2017-06-12 DIAGNOSIS — Z3A18 18 weeks gestation of pregnancy: Secondary | ICD-10-CM | POA: Insufficient documentation

## 2017-06-12 DIAGNOSIS — O09212 Supervision of pregnancy with history of pre-term labor, second trimester: Secondary | ICD-10-CM | POA: Diagnosis not present

## 2017-06-12 DIAGNOSIS — O99891 Other specified diseases and conditions complicating pregnancy: Secondary | ICD-10-CM | POA: Diagnosis present

## 2017-06-12 DIAGNOSIS — N949 Unspecified condition associated with female genital organs and menstrual cycle: Secondary | ICD-10-CM | POA: Diagnosis present

## 2017-06-12 DIAGNOSIS — R102 Pelvic and perineal pain: Secondary | ICD-10-CM | POA: Insufficient documentation

## 2017-06-12 DIAGNOSIS — R1030 Lower abdominal pain, unspecified: Secondary | ICD-10-CM | POA: Diagnosis present

## 2017-06-12 DIAGNOSIS — Z87440 Personal history of urinary (tract) infections: Secondary | ICD-10-CM | POA: Insufficient documentation

## 2017-06-12 DIAGNOSIS — F419 Anxiety disorder, unspecified: Secondary | ICD-10-CM | POA: Insufficient documentation

## 2017-06-12 DIAGNOSIS — M549 Dorsalgia, unspecified: Secondary | ICD-10-CM

## 2017-06-12 DIAGNOSIS — R8271 Bacteriuria: Secondary | ICD-10-CM

## 2017-06-12 DIAGNOSIS — F329 Major depressive disorder, single episode, unspecified: Secondary | ICD-10-CM | POA: Insufficient documentation

## 2017-06-12 DIAGNOSIS — M545 Low back pain: Secondary | ICD-10-CM | POA: Insufficient documentation

## 2017-06-12 DIAGNOSIS — O99012 Anemia complicating pregnancy, second trimester: Secondary | ICD-10-CM | POA: Diagnosis not present

## 2017-06-12 LAB — URINALYSIS, ROUTINE W REFLEX MICROSCOPIC
Bilirubin Urine: NEGATIVE
Glucose, UA: NEGATIVE mg/dL
Hgb urine dipstick: NEGATIVE
KETONES UR: NEGATIVE mg/dL
LEUKOCYTES UA: NEGATIVE
NITRITE: NEGATIVE
PROTEIN: NEGATIVE mg/dL
Specific Gravity, Urine: 1.034 — ABNORMAL HIGH (ref 1.005–1.030)
pH: 5 (ref 5.0–8.0)

## 2017-06-12 LAB — WET PREP, GENITAL
CLUE CELLS WET PREP: NONE SEEN
Sperm: NONE SEEN
TRICH WET PREP: NONE SEEN
Yeast Wet Prep HPF POC: NONE SEEN

## 2017-06-12 LAB — GC/CHLAMYDIA PROBE AMP (~~LOC~~) NOT AT ARMC
Chlamydia: NEGATIVE
NEISSERIA GONORRHEA: NEGATIVE

## 2017-06-12 MED ORDER — ACETAMINOPHEN 500 MG PO TABS
1000.0000 mg | ORAL_TABLET | Freq: Once | ORAL | Status: AC
  Administered 2017-06-12: 1000 mg via ORAL
  Filled 2017-06-12: qty 2

## 2017-06-12 MED ORDER — OXYCODONE HCL 5 MG PO TABS
5.0000 mg | ORAL_TABLET | Freq: Once | ORAL | Status: AC
  Administered 2017-06-12: 5 mg via ORAL
  Filled 2017-06-12: qty 1

## 2017-06-12 NOTE — Discharge Instructions (Signed)
It is important that you use a maternity belt to help alleviate the increased pressure from your growing uterus.  Please pick up one and use it daily.

## 2017-06-12 NOTE — MAU Provider Note (Signed)
History     CSN: [id number]  Arrival date and time: 06/12/17 5621   First Provider Initiated Contact with Patient 06/12/17 0220      Chief Complaint  Patient presents with  . Abdominal Pain   HPI  [name] is a 31 yo [phone] at 18.[redacted] wks gestation presenting to MAU with complaints of low back pain that radiates to lower abdomen.  She reports that the pain is worse in the RT lower part of abdomen.  She statess the pain is aggravated by movement, lying on back or side and sitting up.  She has not taken anything for the pain.  She has Rx for Fioricet and takes that almost daily for migraine h/a's. She reports her most recent SI was yesterday morning.  She denies VB or LOF.  Past Medical History:  Diagnosis Date  . Anemia   . Anxiety   . Depression    ppd with first child  . Gestational diabetes    diet controlled  . Headache(784.0)   . Hx of chlamydia infection   . Positive GBS test 03/18/2017  . Pregnancy induced hypertension   . Preterm labor   . Trichomonosis   . Urinary tract infection   . 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 COLPOSCOPY      Family History  Problem Relation Age of Onset  . Asthma Son   . Asthma Paternal Aunt   . Anesthesia problems Neg Hx   . Hypotension Neg Hx   . Malignant hyperthermia Neg Hx   . Pseudochol deficiency Neg Hx   . Other Neg Hx   . Cancer Neg Hx   . Heart disease Neg Hx   . Stroke Neg Hx   . Hearing loss Neg Hx     Social History  Substance Use Topics  . Smoking status: Never Smoker  . Smokeless tobacco: Never Used     Comment: Pt reports she has never smoked  . Alcohol use No    Allergies: No Known Allergies  Prescriptions Prior to Admission  Medication Sig Dispense Refill Last Dose  . Butalbital-APAP-Caffeine (FIORICET PO) Take by mouth as needed.   Past Week at Unknown time  . cyclobenzaprine (FLEXERIL) 10 MG tablet Take 1 tablet  (10 mg total) by mouth 3 (three) times daily as needed for muscle spasms. 30 tablet 2 06/11/2017 at Unknown time  . Doxylamine-Pyridoxine (DICLEGIS) 10-10 MG TBEC Take 2 tabs at bedtime. May take 1 tab at breakfast if needed & may add 1 tab to lunch if needed 100 tablet 6 06/11/2017 at Unknown time  . Prenatal Vit-Fe Phos-FA-Omega (VITAFOL GUMMIES) 3.33-0.333-34.8 MG CHEW Chew 1 tablet by mouth daily.  38 06/11/2017 at Unknown time  . acetaminophen (TYLENOL) 500 MG tablet Take 500 mg by mouth every 6 (six) hours as needed for mild pain or headache.   Taking  . amoxicillin (AMOXIL) 500 MG capsule Take 1 capsule (500 mg total) by mouth 3 (three) times daily. (Patient not taking: Reported on 06/08/2017) 21 capsule 0 Not Taking  . Elastic Bandages & Supports (COMFORT FIT MATERNITY SUPP SM) MISC 1 Units by Does not apply route daily as needed. 1 each 0 Taking    Review of Systems  Constitutional: Negative.   HENT: Negative.   Endocrine: Negative.   Genitourinary: Positive for pelvic pain.  Musculoskeletal: Positive for back pain.  Skin: Negative.  Allergic/Immunologic: Negative.   Neurological: Negative.   Hematological: Negative.   Psychiatric/Behavioral: Negative.    Physical Exam   Blood pressure (!) 93/58, pulse 87, temperature 97.9 F (36.6 C), temperature source Oral, resp. rate 18, height 5\' 1"  (1.549 m), weight 49 kg (108 lb), last menstrual period 02/01/2017, SpO2 99 %, unknown if currently breastfeeding.  Physical Exam  Constitutional: She is oriented to person, place, and time. She appears well-developed and well-nourished.  HENT:  Head: Normocephalic.  Eyes: Pupils are equal, round, and reactive to light.  Neck: Normal range of motion.  Cardiovascular: Normal rate, regular rhythm and normal heart sounds.   Respiratory: Effort normal and breath sounds normal.  GI: Soft. Bowel sounds are normal. There is tenderness in the right lower quadrant. There is guarding (mild). There is no  rebound and no CVA tenderness.    Genitourinary:  Genitourinary Comments: Uterus: gravid, non-tender, S=D, cx; smooth, pink, no lesions, copious amountof thin white frothy d/c in vaginal vault, closed/long/firm, no CMT or friability, no adnexal tenderness  Musculoskeletal: Normal range of motion.  Neurological: She is alert and oriented to person, place, and time. She has normal reflexes.  Skin: Skin is warm and dry.  Psychiatric: She has a normal mood and affect. Her behavior is normal. Judgment and thought content normal.   FHTs by doppler = 148 bpm MAU Course  Procedures  MDM CCUA Wet Prep GC/CT Tylenol 1000 mg PO Oxycodone 5 mg PO -- improved pain  Results for orders placed or performed during the hospital encounter of 06/12/17 (from the past 24 hour(s))  Urinalysis, Routine w reflex microscopic     Status: Abnormal   Collection Time: 06/12/17  1:53 AM  Result Value Ref Range   Color, Urine YELLOW YELLOW   APPearance CLEAR CLEAR   Specific Gravity, Urine 1.034 (H) 1.005 - 1.030   pH 5.0 5.0 - 8.0   Glucose, UA NEGATIVE NEGATIVE mg/dL   Hgb urine dipstick NEGATIVE NEGATIVE   Bilirubin Urine NEGATIVE NEGATIVE   Ketones, ur NEGATIVE NEGATIVE mg/dL   Protein, ur NEGATIVE NEGATIVE mg/dL   Nitrite NEGATIVE NEGATIVE   Leukocytes, UA NEGATIVE NEGATIVE  Wet prep, genital     Status: Abnormal   Collection Time: 06/12/17  2:22 AM  Result Value Ref Range   Yeast Wet Prep HPF POC NONE SEEN NONE SEEN   Trich, Wet Prep NONE SEEN NONE SEEN   Clue Cells Wet Prep HPF POC NONE SEEN NONE SEEN   WBC, Wet Prep HPF POC MANY (A) NONE SEEN   Sperm NONE SEEN     Assessment and Plan  Back pain affecting pregnancy in second trimester - May take Tylenol 1000 mg po every 6 hrs prn pain - Instructions on back pain in pregnancy given  Round ligament pain - Instructions on RLP given - Advised to get maternity belt and wear it daily  Keep scheduled appt on 8/31 Discharge home Patient  verbalized an understanding of the plan of care and agrees.  Raelyn Mora, MSN, CNM 06/12/2017, 2:21 AM

## 2017-06-12 NOTE — MAU Note (Signed)
Pt here with lower abdominal pain and lower back pain. Denies any bleeding or leaking of fluid.

## 2017-06-14 ENCOUNTER — Encounter: Payer: Self-pay | Admitting: Family Medicine

## 2017-06-15 ENCOUNTER — Ambulatory Visit (INDEPENDENT_AMBULATORY_CARE_PROVIDER_SITE_OTHER): Payer: Medicaid Other | Admitting: General Practice

## 2017-06-15 VITALS — BP 112/57 | HR 76 | Ht 61.0 in | Wt 108.0 lb

## 2017-06-15 DIAGNOSIS — O09212 Supervision of pregnancy with history of pre-term labor, second trimester: Secondary | ICD-10-CM

## 2017-06-15 DIAGNOSIS — Z8751 Personal history of pre-term labor: Secondary | ICD-10-CM

## 2017-06-15 MED ORDER — HYDROXYPROGESTERONE CAPROATE 250 MG/ML IM OIL
250.0000 mg | TOPICAL_OIL | Freq: Once | INTRAMUSCULAR | Status: AC
  Administered 2017-06-15: 250 mg via INTRAMUSCULAR

## 2017-06-15 NOTE — Progress Notes (Signed)
Patient states she has been having 3-4 braxton hicks a day and back pain with round ligament pain. Patient is requesting note to excuse her from community service & ADS classes. Per Dr Vergie LivingPickens, patient should come in for appt to discuss. Informed patient. Patient verbalized understanding

## 2017-06-16 ENCOUNTER — Inpatient Hospital Stay (HOSPITAL_COMMUNITY)
Admission: AD | Admit: 2017-06-16 | Discharge: 2017-06-16 | Disposition: A | Discharge status: Home / Self Care | Payer: Medicaid Other | Source: Ambulatory Visit | Attending: Obstetrics & Gynecology | Admitting: Obstetrics & Gynecology

## 2017-06-16 ENCOUNTER — Encounter (HOSPITAL_COMMUNITY): Payer: Self-pay | Admitting: *Deleted

## 2017-06-16 DIAGNOSIS — N949 Unspecified condition associated with female genital organs and menstrual cycle: Secondary | ICD-10-CM

## 2017-06-16 DIAGNOSIS — O26892 Other specified pregnancy related conditions, second trimester: Secondary | ICD-10-CM | POA: Diagnosis present

## 2017-06-16 DIAGNOSIS — M549 Dorsalgia, unspecified: Secondary | ICD-10-CM | POA: Insufficient documentation

## 2017-06-16 DIAGNOSIS — O9989 Other specified diseases and conditions complicating pregnancy, childbirth and the puerperium: Secondary | ICD-10-CM

## 2017-06-16 DIAGNOSIS — Z3A19 19 weeks gestation of pregnancy: Secondary | ICD-10-CM | POA: Insufficient documentation

## 2017-06-16 DIAGNOSIS — R102 Pelvic and perineal pain: Secondary | ICD-10-CM | POA: Diagnosis not present

## 2017-06-16 DIAGNOSIS — R109 Unspecified abdominal pain: Secondary | ICD-10-CM | POA: Diagnosis not present

## 2017-06-16 LAB — URINALYSIS, ROUTINE W REFLEX MICROSCOPIC
Bacteria, UA: NONE SEEN
Bilirubin Urine: NEGATIVE
Glucose, UA: NEGATIVE mg/dL
Hgb urine dipstick: NEGATIVE
KETONES UR: NEGATIVE mg/dL
Nitrite: NEGATIVE
PH: 5 (ref 5.0–8.0)
Protein, ur: NEGATIVE mg/dL
Specific Gravity, Urine: 1.024 (ref 1.005–1.030)

## 2017-06-16 LAB — WET PREP, GENITAL
SPERM: NONE SEEN
Trich, Wet Prep: NONE SEEN
Yeast Wet Prep HPF POC: NONE SEEN

## 2017-06-16 MED ORDER — IBUPROFEN 800 MG PO TABS
800.0000 mg | ORAL_TABLET | Freq: Once | ORAL | Status: AC
  Administered 2017-06-16: 800 mg via ORAL
  Filled 2017-06-16: qty 1

## 2017-06-16 MED ORDER — ABDOMINAL SUPPORT/L-XL MISC: 1.0000 [IU] | 0 refills | Status: DC | PRN

## 2017-06-16 NOTE — Discharge Instructions (Signed)
Round Ligament Pain during Pregnancy Many women will experience a type of pain referred to as round ligament pain during their pregnancy. This is associated with abdominal pain or discomfort. Since any type of abdominal pain during pregnancy can be disconcerting, it is important to talk about round ligament pain to relieve any anxiety or fears you may have regarding the symptoms you are feeling. Round ligament pain is due to normal changes that take place in the body during pregnancy. It is caused by stretching of the round ligaments attached to the uterus. More commonly it occurs on the right side of the pelvis. Round Ligament: An Overview Typically in the non-pregnant state the uterus is about the size of an apple or pear. There are thick ligaments which hold the uterus in place in the abdomen, referred to as round ligaments. During pregnancy, your uterus will expand in size and weight, and the ligaments supporting it will have to stretch, becoming longer and thinner. As these ligaments pull and tug they may irritate nearby nerve fibers, which causes pain. The severity of the pain in some cases can seem extreme. Some common symptoms of round ligament pain include:  Ligament spasms or contractions/cramps that trigger a sharp pain typically on the right side of the abdomen.  Pain upon waking or suddenly rolling over in your sleep.  Pain in the abdomen that is sharp brought on by exercise or other vigorous activity. Similar Problems Round ligament pain is often mistaken for other medical conditions because the symptoms are similar. Acute abdominal pain during pregnancy may also be a sign of other conditions including:  Abdominal cramps - Some abdominal pain is simply caused by change in bowel habits associated with pregnancy. Gas is a common problem that can cause sharp, shooting pain. You should always seek out medical care if your pain is accompanied by fever, chills, pain  upon urination or if you have difficulty walking. Further exams and tests will be conducted to ensure that you do not have a more serious condition. It is not uncommon for women with lower abdominal pain to have a urinary tract infection, thus you may also be asked for a urine sample. Treatment If all other conditions are ruled out you can treat your round ligament pain relatively easily. You may be advised to take some acetaminophen (Tylenol) to reduce the severity of any persistent pain and asked to reduce your activity level. You can apply a heating pad to the area of pain or take a warm bath. Lying on the opposite side of the pain may help as well. Most women will find relief from round ligament pain simply by altering their daily routines slightly. The good news is round ligament pain will disappear completely once you have given birth to your child!   Back Exercises If you have pain in your back, do these exercises 2-3 times each day or as told by your doctor. When the pain goes away, do the exercises once each day, but repeat the steps more times for each exercise (do more repetitions). If you do not have pain in your back, do these exercises once each day or as told by your doctor. Exercises Single Knee to Chest  Do these steps 3-5 times in a row for each leg: 1. Lie on your back on a firm bed or the floor with your legs stretched out. 2. Bring one knee to your chest. 3. Hold your knee to your chest by grabbing your knee or thigh. 4.  Pull on your knee until you feel a gentle stretch in your lower back. 5. Keep doing the stretch for 10-30 seconds. 6. Slowly let go of your leg and straighten it.  Pelvic Tilt  Do these steps 5-10 times in a row: 1. Lie on your back on a firm bed or the floor with your legs stretched out. 2. Bend your knees so they point up to the ceiling. Your feet should be flat on the floor. 3. Tighten your lower belly (abdomen) muscles to press your lower back  against the floor. This will make your tailbone point up to the ceiling instead of pointing down to your feet or the floor. 4. Stay in this position for 5-10 seconds while you gently tighten your muscles and breathe evenly.  Cat-Cow  Do these steps until your lower back bends more easily: 1. Get on your hands and knees on a firm surface. Keep your hands under your shoulders, and keep your knees under your hips. You may put padding under your knees. 2. Let your head hang down, and make your tailbone point down to the floor so your lower back is round like the back of a cat. 3. Stay in this position for 5 seconds. 4. Slowly lift your head and make your tailbone point up to the ceiling so your back hangs low (sags) like the back of a cow. 5. Stay in this position for 5 seconds.  Press-Ups  Do these steps 5-10 times in a row: 1. Lie on your belly (face-down) on the floor. 2. Place your hands near your head, about shoulder-width apart. 3. While you keep your back relaxed and keep your hips on the floor, slowly straighten your arms to raise the top half of your body and lift your shoulders. Do not use your back muscles. To make yourself more comfortable, you may change where you place your hands. 4. Stay in this position for 5 seconds. 5. Slowly return to lying flat on the floor.  Bridges  Do these steps 10 times in a row: 1. Lie on your back on a firm surface. 2. Bend your knees so they point up to the ceiling. Your feet should be flat on the floor. 3. Tighten your butt muscles and lift your butt off of the floor until your waist is almost as high as your knees. If you do not feel the muscles working in your butt and the back of your thighs, slide your feet 1-2 inches farther away from your butt. 4. Stay in this position for 3-5 seconds. 5. Slowly lower your butt to the floor, and let your butt muscles relax.  If this exercise is too easy, try doing it with your arms crossed over your  chest. Belly Crunches  Do these steps 5-10 times in a row: 1. Lie on your back on a firm bed or the floor with your legs stretched out. 2. Bend your knees so they point up to the ceiling. Your feet should be flat on the floor. 3. Cross your arms over your chest. 4. Tip your chin a little bit toward your chest but do not bend your neck. 5. Tighten your belly muscles and slowly raise your chest just enough to lift your shoulder blades a tiny bit off of the floor. 6. Slowly lower your chest and your head to the floor.  Back Lifts Do these steps 5-10 times in a row: 1. Lie on your belly (face-down) with your arms at your sides, and  rest your forehead on the floor. 2. Tighten the muscles in your legs and your butt. 3. Slowly lift your chest off of the floor while you keep your hips on the floor. Keep the back of your head in line with the curve in your back. Look at the floor while you do this. 4. Stay in this position for 3-5 seconds. 5. Slowly lower your chest and your face to the floor.  Contact a doctor if:  Your back pain gets a lot worse when you do an exercise.  Your back pain does not lessen 2 hours after you exercise. If you have any of these problems, stop doing the exercises. Do not do them again unless your doctor says it is okay. Get help right away if:  You have sudden, very bad back pain. If this happens, stop doing the exercises. Do not do them again unless your doctor says it is okay. This information is not intended to replace advice given to you by your health care provider. Make sure you discuss any questions you have with your health care provider. Document Released: 11/04/2010 Document Revised: 03/09/2016 Document Reviewed: 11/26/2014 Elsevier Interactive Patient Education  [employer].

## 2017-06-16 NOTE — MAU Note (Signed)
Having some lower abd pain and lower back pain all wk.  About 2130 went to bathroom and when I wiped saw pink on tissue.

## 2017-06-16 NOTE — MAU Provider Note (Signed)
History     CSN: [id number]  Arrival date and time: 06/16/17 2207   First Provider Initiated Contact with Patient 06/16/17 2240      Chief Complaint  Patient presents with  . Pelvic Pain   Margaret Cain is a 31 y.o. [id number] at 5542w2d who presents today with back pain, abdominal pain and spotting. She states that she has the pain for about one week, but feels it is getting worse. She states that she has not gotten the maternity support belt as she did not get the rx the last time she was here.    Pelvic Pain  The patient's primary symptoms include pelvic pain and vaginal bleeding. This is a new problem. The current episode started in the past 7 days. The problem occurs intermittently. The problem has been unchanged. Pain severity now: 7/10. The problem affects both sides. She is pregnant. Associated symptoms include abdominal pain and back pain. Pertinent negatives include no chills, dysuria, fever, nausea or vomiting. The vaginal bleeding is spotting (pink spotting with wiping. ). She has not been passing clots. She has not been passing tissue. Exacerbated by: patient denies intercourse in the last 48 hours.  She has tried acetaminophen for the symptoms. The treatment provided no relief.    Past Medical History:  Diagnosis Date  . Anemia   . Anxiety   . Depression    ppd with first child  . Gestational diabetes    diet controlled  . Headache(784.0)   . Hx of chlamydia infection   . Positive GBS test 03/18/2017  . Pregnancy induced hypertension   . Preterm labor   . Trichomonosis   . Urinary tract infection   . 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 COLPOSCOPY      Family History  Problem Relation Age of Onset  . Asthma Son   . Asthma Paternal Aunt   . Anesthesia problems Neg Hx   . Hypotension Neg Hx   . Malignant hyperthermia Neg Hx   . Pseudochol deficiency Neg Hx   . Other Neg Hx   .  Cancer Neg Hx   . Heart disease Neg Hx   . Stroke Neg Hx   . Hearing loss Neg Hx     Social History  Substance Use Topics  . Smoking status: Never Smoker  . Smokeless tobacco: Never Used     Comment: Pt reports she has never smoked  . Alcohol use No    Allergies: No Known Allergies  Prescriptions Prior to Admission  Medication Sig Dispense Refill Last Dose  . acetaminophen (TYLENOL) 500 MG tablet Take 500 mg by mouth every 6 (six) hours as needed for mild pain or headache.   Taking  . Butalbital-APAP-Caffeine (FIORICET PO) Take by mouth as needed.   Past Week at Unknown time  . cyclobenzaprine (FLEXERIL) 10 MG tablet Take 1 tablet (10 mg total) by mouth 3 (three) times daily as needed for muscle spasms. 30 tablet 2 06/11/2017 at Unknown time  . Doxylamine-Pyridoxine (DICLEGIS) 10-10 MG TBEC Take 2 tabs at bedtime. May take 1 tab at breakfast if needed & may add 1 tab to lunch if needed 100 tablet 6 06/11/2017 at Unknown time  . Elastic Bandages & Supports (COMFORT FIT MATERNITY SUPP SM) MISC 1 Units by Does not apply route daily as needed. 1 each 0 Taking  . Prenatal Vit-Fe Phos-FA-Omega (VITAFOL GUMMIES) 3.33-0.333-34.8  MG CHEW Chew 1 tablet by mouth daily.  38 06/11/2017 at Unknown time    Review of Systems  Constitutional: Negative for chills and fever.  Gastrointestinal: Positive for abdominal pain. Negative for nausea and vomiting.  Genitourinary: Positive for pelvic pain and vaginal bleeding. Negative for dysuria.  Musculoskeletal: Positive for back pain.   Physical Exam   Blood pressure (!) 118/54, pulse 84, temperature (!) 97.4 F (36.3 C), resp. rate 18, height 5\' 1"  (1.549 m), weight 111 lb (50.3 kg), last menstrual period 02/01/2017, unknown if currently breastfeeding.  Physical Exam  Nursing note and vitals reviewed. Constitutional: She is oriented to person, place, and time. She appears well-developed and well-nourished. No distress.  HENT:  Head: Normocephalic.   Cardiovascular: Normal rate.   Respiratory: Effort normal.  GI: Soft. There is no tenderness. There is no rebound.  Genitourinary:  Genitourinary Comments:  External: no lesion Vagina: scant amount of pink mucous  Cervix: pink, smooth, closed/thick  Uterus: AGA   Neurological: She is alert and oriented to person, place, and time.  Skin: Skin is warm and dry.  Psychiatric: She has a normal mood and affect.    FHT: 145 with doppler   Results for orders placed or performed during the hospital encounter of 06/16/17 (from the past 24 hour(s))  Urinalysis, Routine w reflex microscopic     Status: Abnormal   Collection Time: 06/16/17 10:20 PM  Result Value Ref Range   Color, Urine YELLOW YELLOW   APPearance HAZY (A) CLEAR   Specific Gravity, Urine 1.024 1.005 - 1.030   pH 5.0 5.0 - 8.0   Glucose, UA NEGATIVE NEGATIVE mg/dL   Hgb urine dipstick NEGATIVE NEGATIVE   Bilirubin Urine NEGATIVE NEGATIVE   Ketones, ur NEGATIVE NEGATIVE mg/dL   Protein, ur NEGATIVE NEGATIVE mg/dL   Nitrite NEGATIVE NEGATIVE   Leukocytes, UA SMALL (A) NEGATIVE   RBC / HPF 0-5 0 - 5 RBC/hpf   WBC, UA 6-30 0 - 5 WBC/hpf   Bacteria, UA NONE SEEN NONE SEEN   Squamous Epithelial / LPF 0-5 (A) NONE SEEN   Mucus PRESENT   Wet prep, genital     Status: Abnormal   Collection Time: 06/16/17 10:35 PM  Result Value Ref Range   Yeast Wet Prep HPF POC NONE SEEN NONE SEEN   Trich, Wet Prep NONE SEEN NONE SEEN   Clue Cells Wet Prep HPF POC PRESENT (A) NONE SEEN   WBC, Wet Prep HPF POC MANY (A) NONE SEEN   Sperm NONE SEEN     MAU Course  Procedures  MDM Patient has had dose of ibuprofen. She reports that she is feeling better.   Assessment and Plan   1. Round ligament pain   2. Back pain affecting pregnancy in second trimester    DC home Comfort measures reviewed  2nd/3rd Trimester precautions  PTL precautions  Fetal kick counts RX: maternity support belt rx provided.  Return to MAU as needed FU with  OB as planned  Follow-up Information    [facility].   Specialty:  Obstetrics and Gynecology Contact information: [address] [place] [address] [phone]           Thressa Sheller 06/16/2017, 10:43 PM

## 2017-06-18 LAB — CULTURE, OB URINE

## 2017-06-19 ENCOUNTER — Telehealth: Payer: Self-pay | Admitting: Obstetrics and Gynecology

## 2017-06-19 ENCOUNTER — Encounter: Payer: Self-pay | Admitting: Family Medicine

## 2017-06-19 MED ORDER — AMOXICILLIN 500 MG PO CAPS: 500.0000 mg | ORAL_CAPSULE | Freq: Three times a day (TID) | ORAL | 0 refills | Status: AC

## 2017-06-19 NOTE — Telephone Encounter (Signed)
TC to pt to inform of (+) GBS bacteriuria and to see if ever treated for it in the past since (+) GBS bacteriuria since 6/1.  Pt states she has not received any antibiotics for (+) GBS bacteriuria.  Rx Amoxicillin 500 mg TID x 7 days [facility] on [address] -- Rx already ready for pick-up, Pharmacologistpharmacy technician will call pt to notify of medication ready for p/u   Raelyn MoraRolitta Rini Moffit, CNM  06/19/2017, 8:45 AM

## 2017-06-21 LAB — GC/CHLAMYDIA PROBE AMP (~~LOC~~) NOT AT ARMC
CHLAMYDIA, DNA PROBE: NEGATIVE
NEISSERIA GONORRHEA: NEGATIVE

## 2017-06-22 ENCOUNTER — Ambulatory Visit: Payer: Medicaid Other

## 2017-06-25 ENCOUNTER — Ambulatory Visit (INDEPENDENT_AMBULATORY_CARE_PROVIDER_SITE_OTHER): Payer: Medicaid Other | Admitting: Advanced Practice Midwife

## 2017-06-25 ENCOUNTER — Encounter: Payer: Self-pay | Admitting: General Practice

## 2017-06-25 VITALS — BP 117/78 | HR 72 | Wt 111.0 lb

## 2017-06-25 DIAGNOSIS — O09212 Supervision of pregnancy with history of pre-term labor, second trimester: Secondary | ICD-10-CM

## 2017-06-25 DIAGNOSIS — O0992 Supervision of high risk pregnancy, unspecified, second trimester: Secondary | ICD-10-CM

## 2017-06-25 DIAGNOSIS — R87810 Cervical high risk human papillomavirus (HPV) DNA test positive: Secondary | ICD-10-CM

## 2017-06-25 DIAGNOSIS — D696 Thrombocytopenia, unspecified: Secondary | ICD-10-CM

## 2017-06-25 DIAGNOSIS — O99119 Other diseases of the blood and blood-forming organs and certain disorders involving the immune mechanism complicating pregnancy, unspecified trimester: Secondary | ICD-10-CM

## 2017-06-25 DIAGNOSIS — O0990 Supervision of high risk pregnancy, unspecified, unspecified trimester: Secondary | ICD-10-CM

## 2017-06-25 DIAGNOSIS — Z8751 Personal history of pre-term labor: Secondary | ICD-10-CM

## 2017-06-25 DIAGNOSIS — R87610 Atypical squamous cells of undetermined significance on cytologic smear of cervix (ASC-US): Secondary | ICD-10-CM

## 2017-06-25 DIAGNOSIS — R8271 Bacteriuria: Secondary | ICD-10-CM

## 2017-06-25 NOTE — Progress Notes (Signed)
   PRENATAL VISIT NOTE  Subjective:  Margaret Cain is a 31 y.o. [id number] at 6222w4d being seen today for ongoing prenatal care.  She is currently monitored for the following issues for this high-risk pregnancy and has Smoker; Pica; History of preterm delivery; ASCUS with positive high risk HPV cervical; Anemia affecting pregnancy, antepartum; GBS bacteriuria; Supervision of high risk pregnancy, antepartum; History of gestational diabetes; Migraine headache; Round ligament pain; and Back pain affecting pregnancy on her problem list.  Patient reports occasional contractions.  Contractions: Irritability.  .  Movement: Present. Denies leaking of fluid. 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    The following portions of the patient's history were reviewed and updated as appropriate: allergies, current medications, past family history, past medical history, past social history, past surgical history and problem list. Problem list updated.  Objective:   Vitals:   06/25/17 1152  BP: 117/78  Pulse: 72  Weight: 111 lb (50.3 kg)    Fetal Status: Fetal Heart Rate (bpm): 145   Movement: Present     General:  Alert, oriented and cooperative. Patient is in no acute distress.  Skin: Skin is warm and dry. No rash noted.   Cardiovascular: Normal heart rate noted  Respiratory: Normal respiratory effort, no problems with respiration noted  Abdomen: Soft, gravid, appropriate for gestational age.  Pain/Pressure: Present     Pelvic: Cervical exam declined        Extremities: Normal range of motion.  Edema: None  Mental Status:  Normal mood and affect. Normal behavior. Normal judgment and thought content.   Assessment and Plan:  Pregnancy: [id number] at 6022w4d  1. GBS bacteriuria--Vomited up several doses of ABX  - Culture, OB Urine  2. Thrombocytopenia affecting pregnancy (HCC)  - CBC - AFP, Serum, Open Spina Bifida  3. Supervision of high risk pregnancy,  antepartum  - AFP, Serum, Open Spina Bifida  4. Hx PTD - 17-P weekly  Preterm labor symptoms and general obstetric precautions including but not limited to vaginal bleeding, contractions, leaking of fluid and fetal movement were reviewed in detail with the patient. Please refer to After Visit Summary for other counseling recommendations.  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 Explained that I it is not medically necessary to discontinue [employer]. Comfort measures. PTD precautions.   Dorathy KinsmanVirginia Paiden Caraveo, CNM

## 2017-06-25 NOTE — Progress Notes (Signed)
She did not complete the full dose of Amoxicillin

## 2017-06-25 NOTE — Patient Instructions (Signed)

## 2017-06-26 ENCOUNTER — Encounter: Payer: Self-pay | Admitting: Family Medicine

## 2017-06-27 MED ORDER — HYDROXYPROGESTERONE CAPROATE 250 MG/ML IM OIL
250.0000 mg | TOPICAL_OIL | Freq: Once | INTRAMUSCULAR | Status: AC
  Administered 2017-06-25: 250 mg via INTRAMUSCULAR

## 2017-06-27 NOTE — Addendum Note (Signed)
Addended by: Cheree DittoGRAHAM, Kindle Strohmeier A on: 06/27/2017 09:35 AM   Modules accepted: Orders

## 2017-06-28 LAB — AFP, SERUM, OPEN SPINA BIFIDA
AFP MoM: 0.84
AFP VALUE AFPOSL: 63.4 ng/mL
GEST. AGE ON COLLECTION DATE: 20 wk
MATERNAL AGE AT EDD: 30.3 a
OSBR Risk 1 IN: 10000
Test Results:: NEGATIVE
Weight: 111 [lb_av]

## 2017-06-28 LAB — CBC
HEMATOCRIT: 32.8 % — AB (ref 34.0–46.6)
HEMOGLOBIN: 10.5 g/dL — AB (ref 11.1–15.9)
MCH: 28.5 pg (ref 26.6–33.0)
MCHC: 32 g/dL (ref 31.5–35.7)
MCV: 89 fL (ref 79–97)
Platelets: 193 10*3/uL (ref 150–379)
RBC: 3.68 x10E6/uL — ABNORMAL LOW (ref 3.77–5.28)
RDW: 14.3 % (ref 12.3–15.4)
WBC: 6 10*3/uL (ref 3.4–10.8)

## 2017-06-29 ENCOUNTER — Ambulatory Visit: Payer: Medicaid Other

## 2017-06-30 ENCOUNTER — Other Ambulatory Visit: Payer: Self-pay | Admitting: Advanced Practice Midwife

## 2017-06-30 ENCOUNTER — Encounter: Payer: Self-pay | Admitting: Advanced Practice Midwife

## 2017-06-30 DIAGNOSIS — R8271 Bacteriuria: Secondary | ICD-10-CM

## 2017-06-30 LAB — URINE CULTURE, OB REFLEX

## 2017-06-30 LAB — CULTURE, OB URINE

## 2017-06-30 MED ORDER — CEPHALEXIN 500 MG PO CAPS: 500.0000 mg | ORAL_CAPSULE | Freq: Four times a day (QID) | ORAL | 0 refills | Status: DC

## 2017-07-01 ENCOUNTER — Encounter: Payer: Self-pay | Admitting: Family Medicine

## 2017-07-02 ENCOUNTER — Telehealth: Payer: Self-pay

## 2017-07-02 NOTE — Telephone Encounter (Signed)
CALLED PATIENT TO INFORM HER GBS IN URINE. NO ANSWER I HAVE LEFT A MESSAGE FOR HER TO CALL us BACK AND TO PICK UP RX AT HER [facility].

## 2017-07-02 NOTE — Telephone Encounter (Signed)
-----   Message from [place], [place] sent at 06/30/2017 10:32 PM EDT ----- Urine culture still pos GBS. Pt could not tolerate Amox. I Rx'd Keflex  PO QID x 7 days.

## 2017-07-06 ENCOUNTER — Encounter: Payer: Medicaid Other | Admitting: Family Medicine

## 2017-07-06 ENCOUNTER — Encounter (HOSPITAL_COMMUNITY): Payer: Self-pay

## 2017-07-06 ENCOUNTER — Ambulatory Visit (HOSPITAL_COMMUNITY)
Admission: RE | Admit: 2017-07-06 | Discharge: 2017-07-06 | Disposition: A | Discharge status: Home / Self Care | Payer: Medicaid Other | Source: Ambulatory Visit | Attending: Family Medicine | Admitting: Family Medicine

## 2017-07-06 DIAGNOSIS — O99332 Smoking (tobacco) complicating pregnancy, second trimester: Secondary | ICD-10-CM | POA: Insufficient documentation

## 2017-07-06 DIAGNOSIS — Z362 Encounter for other antenatal screening follow-up: Secondary | ICD-10-CM | POA: Insufficient documentation

## 2017-07-06 DIAGNOSIS — Z3A22 22 weeks gestation of pregnancy: Secondary | ICD-10-CM | POA: Insufficient documentation

## 2017-07-06 DIAGNOSIS — IMO0002 Reserved for concepts with insufficient information to code with codable children: Secondary | ICD-10-CM

## 2017-07-06 DIAGNOSIS — O09292 Supervision of pregnancy with other poor reproductive or obstetric history, second trimester: Secondary | ICD-10-CM | POA: Insufficient documentation

## 2017-07-06 DIAGNOSIS — O09212 Supervision of pregnancy with history of pre-term labor, second trimester: Secondary | ICD-10-CM | POA: Insufficient documentation

## 2017-07-06 DIAGNOSIS — Z0489 Encounter for examination and observation for other specified reasons: Secondary | ICD-10-CM

## 2017-07-06 NOTE — ED Notes (Signed)
Pt stated that she would like to leave a message with security to have when she came in to deliver.  When this RN questioned what this was about, pt stated that she did not want social services to come to her room after she has the baby.  There are some problems with her other children and she doesn't want them to cause a big commotion.  Will contact social work and notify them of the situation.

## 2017-07-06 NOTE — Addendum Note (Signed)
Encounter addended by: Heidi Dach, RN on: 07/06/2017  4:08 PM<BR>    Actions taken: Sign clinical note

## 2017-07-09 ENCOUNTER — Other Ambulatory Visit (HOSPITAL_COMMUNITY): Payer: Self-pay | Admitting: *Deleted

## 2017-07-09 DIAGNOSIS — IMO0002 Reserved for concepts with insufficient information to code with codable children: Secondary | ICD-10-CM

## 2017-07-09 DIAGNOSIS — Z0489 Encounter for examination and observation for other specified reasons: Secondary | ICD-10-CM

## 2017-07-10 ENCOUNTER — Encounter (HOSPITAL_COMMUNITY): Payer: Self-pay | Admitting: *Deleted

## 2017-07-10 ENCOUNTER — Inpatient Hospital Stay (HOSPITAL_COMMUNITY)
Admission: AD | Admit: 2017-07-10 | Discharge: 2017-07-10 | Disposition: A | Discharge status: Home / Self Care | Payer: Medicaid Other | Source: Ambulatory Visit | Attending: Family Medicine | Admitting: Family Medicine

## 2017-07-10 DIAGNOSIS — O9989 Other specified diseases and conditions complicating pregnancy, childbirth and the puerperium: Secondary | ICD-10-CM

## 2017-07-10 DIAGNOSIS — J069 Acute upper respiratory infection, unspecified: Secondary | ICD-10-CM | POA: Diagnosis not present

## 2017-07-10 DIAGNOSIS — Z3A22 22 weeks gestation of pregnancy: Secondary | ICD-10-CM | POA: Diagnosis not present

## 2017-07-10 DIAGNOSIS — O99512 Diseases of the respiratory system complicating pregnancy, second trimester: Secondary | ICD-10-CM | POA: Insufficient documentation

## 2017-07-10 DIAGNOSIS — R0981 Nasal congestion: Secondary | ICD-10-CM | POA: Diagnosis present

## 2017-07-10 LAB — URINALYSIS, ROUTINE W REFLEX MICROSCOPIC
BILIRUBIN URINE: NEGATIVE
GLUCOSE, UA: NEGATIVE mg/dL
HGB URINE DIPSTICK: NEGATIVE
Ketones, ur: NEGATIVE mg/dL
Leukocytes, UA: NEGATIVE
Nitrite: NEGATIVE
PH: 8 (ref 5.0–8.0)
Protein, ur: NEGATIVE mg/dL
SPECIFIC GRAVITY, URINE: 1.005 (ref 1.005–1.030)

## 2017-07-10 NOTE — MAU Note (Signed)
"  cold since last night" --patient states she thinks she is getting a head cold (+congestion, runny nose, cough--non productive, denies fever or chills) Mild headache--took tylenol last night at 9pm

## 2017-07-10 NOTE — MAU Provider Note (Signed)
History     CSN: [id number]  Arrival date and time: 07/10/17 0808   First Provider Initiated Contact with Patient 07/10/17 [phone]      Chief Complaint  Patient presents with  . Headache  . URI   [id number] .5 wks here with congestion. Sx started last night, initially in nose then moved into throat today. No cough. No CP. Some SOB last night. No fever, chills, or body aches. No sick contacts. No pregnancy complaints.    OB History    Gravida Para Term Preterm AB Living   SAB TAB Ectopic Multiple Live Births   1 0 0 0 6      Past Medical History:  Diagnosis Date  . Anemia   . Anxiety   . Depression    ppd with first child  . Gestational diabetes    diet controlled  . Headache(784.0)   . Hx of chlamydia infection   . Positive GBS test 03/18/2017  . Pregnancy induced hypertension   . Preterm labor   . Trichomonosis   . Urinary tract infection   . 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 COLPOSCOPY      Family History  Problem Relation Age of Onset  . Asthma Son   . Asthma Paternal Aunt   . Anesthesia problems Neg Hx   . Hypotension Neg Hx   . Malignant hyperthermia Neg Hx   . Pseudochol deficiency Neg Hx   . Other Neg Hx   . Cancer Neg Hx   . Heart disease Neg Hx   . Stroke Neg Hx   . Hearing loss Neg Hx     Social History  Substance Use Topics  . Smoking status: Never Smoker  . Smokeless tobacco: Never Used     Comment: Pt reports she has never smoked  . Alcohol use No    Allergies: No Known Allergies  Prescriptions Prior to Admission  Medication Sig Dispense Refill Last Dose  . acetaminophen (TYLENOL) 500 MG tablet Take 500 mg by mouth every 6 (six) hours as needed for mild pain or headache.   Not Taking  . Butalbital-APAP-Caffeine (FIORICET PO) Take by mouth as needed.   Taking  . cephALEXin (KEFLEX) 500 MG capsule Take 1 capsule (500 mg total) by mouth 4  (four) times daily. 28 capsule 0 Taking  . cyclobenzaprine (FLEXERIL) 10 MG tablet Take 1 tablet (10 mg total) by mouth 3 (three) times daily as needed for muscle spasms. 30 tablet 2 Taking  . Doxylamine-Pyridoxine (DICLEGIS) 10-10 MG TBEC Take 2 tabs at bedtime. May take 1 tab at breakfast if needed & may add 1 tab to lunch if needed (Patient not taking: Reported on 06/25/2017) 100 tablet 6 Not Taking  . Elastic Bandages & Supports (ABDOMINAL SUPPORT/L-XL) MISC 1 Units by Does not apply route as needed. (Patient not taking: Reported on 07/06/2017) 1 each 0 Not Taking  . Elastic Bandages & Supports (COMFORT FIT MATERNITY SUPP SM) MISC 1 Units by Does not apply route daily as needed. (Patient not taking: Reported on 07/06/2017) 1 each 0 Not Taking  . Prenatal Vit-Fe Phos-FA-Omega (VITAFOL GUMMIES) 3.33-0.333-34.8 MG CHEW Chew 1 tablet by mouth daily.  38 Taking    Review of Systems  Constitutional: Negative for chills and fever.  HENT: Positive for congestion. Negative for ear pain, rhinorrhea and sore throat.  Respiratory: Negative for cough and shortness of breath.   Cardiovascular: Negative for chest pain.  Gastrointestinal: Negative for abdominal pain.  Genitourinary: Negative for vaginal bleeding.   Physical Exam   Blood pressure 124/67, pulse 94, temperature 98.1 F (36.7 C), temperature source Oral, resp. rate 17, weight 114 lb 1.9 oz (51.8 kg), last menstrual period 02/01/2017, SpO2 100 %, unknown if currently breastfeeding.  Physical Exam  Nursing note and vitals reviewed. Constitutional: She is oriented to person, place, and time. She appears well-developed and well-nourished. No distress.  HENT:  Head: Normocephalic and atraumatic.  Right Ear: Tympanic membrane, external ear and ear canal normal.  Left Ear: Tympanic membrane, external ear and ear canal normal.  Nose: Nose normal. No mucosal edema.  Mouth/Throat: Uvula is midline, oropharynx is clear and moist and mucous membranes  are normal. No oropharyngeal exudate, posterior oropharyngeal edema, posterior oropharyngeal erythema or tonsillar abscesses.  Neck: Normal range of motion.  Cardiovascular: Normal rate, regular rhythm and normal heart sounds.   Respiratory: Effort normal and breath sounds normal. No respiratory distress. She has no wheezes. She has no rales.  Musculoskeletal: Normal range of motion.  Neurological: She is alert and oriented to person, place, and time.  Skin: Skin is warm and dry.  Psychiatric: She has a normal mood and affect.  FHT 150  Results for orders placed or performed during the hospital encounter of 07/10/17 (from the past 24 hour(s))  Urinalysis, Routine w reflex microscopic     Status: Abnormal   Collection Time: 07/10/17  8:20 AM  Result Value Ref Range   Color, Urine STRAW (A) YELLOW   APPearance CLEAR CLEAR   Specific Gravity, Urine 1.005 1.005 - 1.030   pH 8.0 5.0 - 8.0   Glucose, UA NEGATIVE NEGATIVE mg/dL   Hgb urine dipstick NEGATIVE NEGATIVE   Bilirubin Urine NEGATIVE NEGATIVE   Ketones, ur NEGATIVE NEGATIVE mg/dL   Protein, ur NEGATIVE NEGATIVE mg/dL   Nitrite NEGATIVE NEGATIVE   Leukocytes, UA NEGATIVE NEGATIVE   MAU Course  Procedures  MDM Labs ordered and reviewed. No sx of pneumonia or influenza. Likely upper resp virus. Discussed supportive measures. Stable for discharge home.  Assessment and Plan   1. [redacted] weeks gestation of pregnancy   2. Upper respiratory virus    Discharge home Follow up in OB office as scheduled OTC med list provided Rest  Hydrate  Allergies as of 07/10/2017   No Known Allergies     Medication List    TAKE these medications   acetaminophen 500 MG tablet Commonly known as:  TYLENOL Take 500 mg by mouth every 6 (six) hours as needed for mild pain or headache.   cephALEXin 500 MG capsule Commonly known as:  KEFLEX Take 1 capsule (500 mg total) by mouth 4 (four) times daily.   COMFORT FIT MATERNITY SUPP SM Misc 1 Units  by Does not apply route daily as needed.   ABDOMINAL SUPPORT/L-XL Misc 1 Units by Does not apply route as needed.   cyclobenzaprine 10 MG tablet Commonly known as:  FLEXERIL Take 1 tablet (10 mg total) by mouth 3 (three) times daily as needed for muscle spasms.   Doxylamine-Pyridoxine 10-10 MG Tbec Commonly known as:  DICLEGIS Take 2 tabs at bedtime. May take 1 tab at breakfast if needed & may add 1 tab to lunch if needed   FIORICET PO Take by mouth as needed.   VITAFOL GUMMIES 3.33-0.333-34.8 MG Chew Chew 1 tablet by mouth daily.  Discharge Care Instructions        Start     Ordered   07/10/17 0000  Discharge patient    Question Answer Comment  Discharge disposition 01-Home or Self Care   Discharge patient date 07/10/2017      07/10/17 1610     Donette Larry, CNM 07/10/2017, 9:06 AM

## 2017-07-10 NOTE — Discharge Instructions (Signed)
Upper Respiratory Infection, Adult Most upper respiratory infections (URIs) are caused by a virus. A URI affects the nose, throat, and upper air passages. The most common type of URI is often called "the common cold." Follow these instructions at home:  Take medicines only as told by your doctor.  Gargle warm saltwater or take cough drops to comfort your throat as told by your doctor.  Use a warm mist humidifier or inhale steam from a shower to increase air moisture. This may make it easier to breathe.  Drink enough fluid to keep your pee (urine) clear or pale yellow.  Eat soups and other clear broths.  Have a healthy diet.  Rest as needed.  Go back to work when your fever is gone or your doctor says it is okay. ? You may need to stay home longer to avoid giving your URI to others. ? You can also wear a face mask and wash your hands often to prevent spread of the virus.  Use your inhaler more if you have asthma.  Do not use any tobacco products, including cigarettes, chewing tobacco, or electronic cigarettes. If you need help quitting, ask your doctor. Contact a doctor if:  You are getting worse, not better.  Your symptoms are not helped by medicine.  You have chills.  You are getting more short of breath.  You have brown or red mucus.  You have yellow or brown discharge from your nose.  You have pain in your face, especially when you bend forward.  You have a fever.  You have puffy (swollen) neck glands.  You have pain while swallowing.  You have white areas in the back of your throat. Get help right away if:  You have very bad or constant: ? Headache. ? Ear pain. ? Pain in your forehead, behind your eyes, and over your cheekbones (sinus pain). ? Chest pain.  You have long-lasting (chronic) lung disease and any of the following: ? Wheezing. ? Long-lasting cough. ? Coughing up blood. ? A change in your usual mucus.  You have a stiff neck.  You have  changes in your: ? Vision. ? Hearing. ? Thinking. ? Mood. This information is not intended to replace advice given to you by your health care provider. Make sure you discuss any questions you have with your health care provider. Document Released: 03/20/2008 Document Revised: 06/04/2016 Document Reviewed: 01/07/2014 Elsevier Interactive Patient Education  2018 [employer]  

## 2017-07-19 ENCOUNTER — Inpatient Hospital Stay (HOSPITAL_COMMUNITY)
Admission: AD | Admit: 2017-07-19 | Discharge: 2017-07-20 | Disposition: A | Discharge status: Home / Self Care | Payer: Medicaid Other | Source: Ambulatory Visit | Attending: Obstetrics and Gynecology | Admitting: Obstetrics and Gynecology

## 2017-07-19 DIAGNOSIS — Z3A24 24 weeks gestation of pregnancy: Secondary | ICD-10-CM | POA: Insufficient documentation

## 2017-07-19 DIAGNOSIS — O09212 Supervision of pregnancy with history of pre-term labor, second trimester: Secondary | ICD-10-CM

## 2017-07-19 DIAGNOSIS — O26872 Cervical shortening, second trimester: Secondary | ICD-10-CM | POA: Insufficient documentation

## 2017-07-19 DIAGNOSIS — O09892 Supervision of other high risk pregnancies, second trimester: Secondary | ICD-10-CM

## 2017-07-19 DIAGNOSIS — O4702 False labor before 37 completed weeks of gestation, second trimester: Secondary | ICD-10-CM

## 2017-07-20 ENCOUNTER — Encounter (HOSPITAL_COMMUNITY): Payer: Self-pay | Admitting: *Deleted

## 2017-07-20 ENCOUNTER — Inpatient Hospital Stay (HOSPITAL_COMMUNITY): Payer: Medicaid Other

## 2017-07-20 ENCOUNTER — Telehealth: Payer: Self-pay | Admitting: *Deleted

## 2017-07-20 DIAGNOSIS — O26872 Cervical shortening, second trimester: Secondary | ICD-10-CM | POA: Diagnosis not present

## 2017-07-20 DIAGNOSIS — Z3A24 24 weeks gestation of pregnancy: Secondary | ICD-10-CM | POA: Diagnosis not present

## 2017-07-20 LAB — URINALYSIS, ROUTINE W REFLEX MICROSCOPIC
Bilirubin Urine: NEGATIVE
Glucose, UA: NEGATIVE mg/dL
Hgb urine dipstick: NEGATIVE
Ketones, ur: NEGATIVE mg/dL
LEUKOCYTES UA: NEGATIVE
NITRITE: NEGATIVE
PH: 6 (ref 5.0–8.0)
Protein, ur: NEGATIVE mg/dL
SPECIFIC GRAVITY, URINE: 1.017 (ref 1.005–1.030)

## 2017-07-20 MED ORDER — LACTATED RINGERS IV BOLUS (SEPSIS)
1000.0000 mL | Freq: Once | INTRAVENOUS | Status: AC
  Administered 2017-07-20: 1000 mL via INTRAVENOUS

## 2017-07-20 MED ORDER — PROGESTERONE MICRONIZED 200 MG PO CAPS: 200.0000 mg | ORAL_CAPSULE | Freq: Every day | ORAL | Status: DC

## 2017-07-20 MED ORDER — PROGESTERONE MICRONIZED 200 MG PO CAPS
200.0000 mg | ORAL_CAPSULE | Freq: Once | ORAL | Status: AC
  Administered 2017-07-20: 200 mg via VAGINAL
  Filled 2017-07-20: qty 1

## 2017-07-20 MED ORDER — NIFEDIPINE 10 MG PO CAPS
10.0000 mg | ORAL_CAPSULE | ORAL | Status: DC | PRN
  Administered 2017-07-20: 10 mg via ORAL
  Filled 2017-07-20: qty 1

## 2017-07-20 MED ORDER — PROGESTERONE MICRONIZED 200 MG PO CAPS: ORAL_CAPSULE | ORAL | 0 refills | Status: DC

## 2017-07-20 NOTE — MAU Note (Signed)
Pt reports "not feeling well" for the past two days. Pt feeling crampy and nauseated. Pt had intercourse 2 hours ago and cramping has been worse since. Denies LOF or Bleeding.

## 2017-07-20 NOTE — Telephone Encounter (Signed)
Pt seen @ MAU last night and found to have short cervix on US exam.  Per Judeth Horn, Dr. Vergie Living wants pt to have repeat transvag US on 10/12 to assess cervical length. Appt scheduled for 10/12 @ 1230.  Pt was called and informed of [place] scheduled as well as the reason for the exam. She voiced understanding and agreed to plan of care.

## 2017-07-20 NOTE — MAU Provider Note (Signed)
History     CSN: [id number]  Arrival date and time: 07/19/17 2348  First Provider Initiated Contact with Patient 07/20/17 0025      Chief Complaint  Patient presents with  . Abdominal Pain  . Back Pain   HPI Margaret Cain is a 30 y.o. [id number] at [redacted]w[redacted]d who presents with contractions. Reports lower abdominal cramping for the last 2 days. Had intercourse 2 hours PTA and reports worsening pain & contractions since then. Can't tell how frequent ctx are. Pain worse in low back. Rates pain 7/10. Has not treated. Denies vaginal bleeding, vaginal discharge, dysuria, LOF, n/v/d. Positive fetal movement. History of 35 wk delivery in 2013 d/t PPROM. All other deliveries were term. Was on 17-P; last injection was 9/8. States she has not been getting them weekly since then d/t not being given an appointment.     OB History    Gravida Para Term Preterm AB Living   SAB TAB Ectopic Multiple Live Births   1 0 0 0 6      Past Medical History:  Diagnosis Date  . Anemia   . Anxiety   . Depression    ppd with first child  . Gestational diabetes    diet controlled  . Headache(784.0)   . Hx of chlamydia infection   . Positive GBS test 03/18/2017  . Pregnancy induced hypertension   . Preterm labor   . Trichomonosis   . Urinary tract infection   . 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 COLPOSCOPY      Family History  Problem Relation Age of Onset  . Asthma Son   . Asthma Paternal Aunt   . Anesthesia problems Neg Hx   . Hypotension Neg Hx   . Malignant hyperthermia Neg Hx   . Pseudochol deficiency Neg Hx   . Other Neg Hx   . Cancer Neg Hx   . Heart disease Neg Hx   . Stroke Neg Hx   . Hearing loss Neg Hx     Social History  Substance Use Topics  . Smoking status: Never Smoker  . Smokeless tobacco: Never Used     Comment: Pt reports she has never smoked  . Alcohol use No    Allergies:  No Known Allergies  Prescriptions Prior to Admission  Medication Sig Dispense Refill Last Dose  . acetaminophen (TYLENOL) 500 MG tablet Take 500 mg by mouth every 6 (six) hours as needed for mild pain or headache.   Not Taking  . Butalbital-APAP-Caffeine (FIORICET PO) Take by mouth as needed.   Taking  . cephALEXin (KEFLEX) 500 MG capsule Take 1 capsule (500 mg total) by mouth 4 (four) times daily. 28 capsule 0 Taking  . cyclobenzaprine (FLEXERIL) 10 MG tablet Take 1 tablet (10 mg total) by mouth 3 (three) times daily as needed for muscle spasms. 30 tablet 2 Taking  . Doxylamine-Pyridoxine (DICLEGIS) 10-10 MG TBEC Take 2 tabs at bedtime. May take 1 tab at breakfast if needed & may add 1 tab to lunch if needed (Patient not taking: Reported on 06/25/2017) 100 tablet 6 Not Taking  . Elastic Bandages & Supports (ABDOMINAL SUPPORT/L-XL) MISC 1 Units by Does not apply route as needed. (Patient not taking: Reported on 07/06/2017) 1 each 0 Not Taking  . Elastic [id number] & Supports (COMFORT FIT MATERNITY SUPP SM) MISC 1 Units  by Does not apply route daily as needed. (Patient not taking: Reported on 07/06/2017) 1 each 0 Not Taking  . Prenatal Vit-Fe Phos-FA-Omega (VITAFOL GUMMIES) 3.33-0.333-34.8 MG CHEW Chew 1 tablet by mouth daily.  38 Taking    Review of Systems  Constitutional: Negative.   Gastrointestinal: Positive for abdominal pain. Negative for constipation, diarrhea, nausea and vomiting.  Genitourinary: Negative.  Negative for dysuria, vaginal bleeding and vaginal discharge.  Musculoskeletal: Positive for back pain.   Physical Exam   Blood pressure (!) 107/58, pulse 82, temperature 98.7 F (37.1 C), resp. rate 18, height 5' (1.524 m), weight 115 lb (52.2 kg), last menstrual period 02/01/2017, unknown if currently breastfeeding.  Physical Exam  Nursing note and vitals reviewed. Constitutional: She is oriented to person, place, and time. She appears well-developed and well-nourished. No  distress.  HENT:  Head: Normocephalic and atraumatic.  Eyes: Conjunctivae are normal. Right eye exhibits no discharge. Left eye exhibits no discharge. No scleral icterus.  Neck: Normal range of motion.  Cardiovascular: Regular rhythm.   Respiratory: Effort normal. No respiratory distress.  GI: Soft. There is no tenderness.  Ctx palpate mild with adequate resting tone  Neurological: She is alert and oriented to person, place, and time.  Skin: Skin is warm and dry. She is not diaphoretic.  Psychiatric: She has a normal mood and affect. Her behavior is normal. Judgment and thought content normal.   Dilation: Closed Effacement (%): Thick Exam by:: E Berklie Dethlefs  Fetal Tracing:  Baseline: 135 Variability: moderate Accelerations: 10x10 Decelerations: none  Toco: initially Q2-5 mins, decreased with IVF & procardia  MAU Course  Procedures Results for orders placed or performed during the hospital encounter of 07/19/17 (from the past 24 hour(s))  Urinalysis, Routine w reflex microscopic     Status: None   Collection Time: 07/20/17 12:07 AM  Result Value Ref Range   Color, Urine YELLOW YELLOW   APPearance CLEAR CLEAR   Specific Gravity, Urine 1.017 1.005 - 1.030   pH 6.0 5.0 - 8.0   Glucose, UA NEGATIVE NEGATIVE mg/dL   Hgb urine dipstick NEGATIVE NEGATIVE   Bilirubin Urine NEGATIVE NEGATIVE   Ketones, ur NEGATIVE NEGATIVE mg/dL   Protein, ur NEGATIVE NEGATIVE mg/dL   Nitrite NEGATIVE NEGATIVE   Leukocytes, UA NEGATIVE NEGATIVE   No results found.  MDM Fetal tracing appropriate for gestation. Ctx every 3-4 minutes. Cervix externally 1 cm, internally closed; long. TVUS ordered for CL -- CL 2.4 cm which is decreased from previous imaging.  IV fluids bolus & procardia given. Patient reports improvement in symptoms & ctx decreased on monitor.  C/w Dr. Vergie Living. Will start on PV prometrium nightly. Will check cervical length in one week. Patient on pelvic rest.   Assessment and Plan   A: 1. Short cervix during pregnancy in second trimester   2. [redacted] weeks gestation of pregnancy   3. Preterm uterine contractions in second trimester, antepartum   4. History of preterm delivery, currently pregnant in second trimester    P: Discharge home Pelvic rest Rx prometrium 200 mg PV QHS Msg to Clinical pool for TVUS for CL on 10/12 Stressed importance of keeping prenatal appointments Discussed reasons to return to MAU  Judeth Horn 07/20/2017, 12:25 AM

## 2017-07-20 NOTE — Discharge Instructions (Signed)

## 2017-07-21 ENCOUNTER — Encounter (HOSPITAL_COMMUNITY): Payer: Self-pay

## 2017-07-21 ENCOUNTER — Encounter (HOSPITAL_COMMUNITY): Payer: Self-pay | Admitting: *Deleted

## 2017-07-21 ENCOUNTER — Inpatient Hospital Stay (EMERGENCY_DEPARTMENT_HOSPITAL)
Admission: AD | Admit: 2017-07-21 | Discharge: 2017-07-21 | Disposition: A | Discharge status: Home / Self Care | Payer: Medicaid Other | Source: Ambulatory Visit | Attending: Obstetrics & Gynecology | Admitting: Obstetrics & Gynecology

## 2017-07-21 ENCOUNTER — Inpatient Hospital Stay (HOSPITAL_COMMUNITY)
Admission: AD | Admit: 2017-07-21 | Discharge: 2017-07-21 | Disposition: A | Discharge status: Home / Self Care | Payer: Medicaid Other | Source: Ambulatory Visit | Attending: Obstetrics and Gynecology | Admitting: Obstetrics and Gynecology

## 2017-07-21 DIAGNOSIS — R109 Unspecified abdominal pain: Secondary | ICD-10-CM

## 2017-07-21 DIAGNOSIS — N898 Other specified noninflammatory disorders of vagina: Secondary | ICD-10-CM

## 2017-07-21 DIAGNOSIS — Z3A24 24 weeks gestation of pregnancy: Secondary | ICD-10-CM | POA: Diagnosis not present

## 2017-07-21 DIAGNOSIS — O26859 Spotting complicating pregnancy, unspecified trimester: Secondary | ICD-10-CM

## 2017-07-21 DIAGNOSIS — O26892 Other specified pregnancy related conditions, second trimester: Secondary | ICD-10-CM

## 2017-07-21 DIAGNOSIS — O4702 False labor before 37 completed weeks of gestation, second trimester: Secondary | ICD-10-CM | POA: Diagnosis not present

## 2017-07-21 DIAGNOSIS — O9989 Other specified diseases and conditions complicating pregnancy, childbirth and the puerperium: Secondary | ICD-10-CM

## 2017-07-21 DIAGNOSIS — O26852 Spotting complicating pregnancy, second trimester: Secondary | ICD-10-CM | POA: Diagnosis present

## 2017-07-21 DIAGNOSIS — M549 Dorsalgia, unspecified: Secondary | ICD-10-CM

## 2017-07-21 DIAGNOSIS — N949 Unspecified condition associated with female genital organs and menstrual cycle: Secondary | ICD-10-CM

## 2017-07-21 DIAGNOSIS — O99891 Other specified diseases and conditions complicating pregnancy: Secondary | ICD-10-CM

## 2017-07-21 DIAGNOSIS — R8271 Bacteriuria: Secondary | ICD-10-CM

## 2017-07-21 LAB — URINALYSIS, ROUTINE W REFLEX MICROSCOPIC
BILIRUBIN URINE: NEGATIVE
BILIRUBIN URINE: NEGATIVE
GLUCOSE, UA: NEGATIVE mg/dL
GLUCOSE, UA: 150 mg/dL — AB
Hgb urine dipstick: NEGATIVE
Hgb urine dipstick: NEGATIVE
KETONES UR: NEGATIVE mg/dL
KETONES UR: 5 mg/dL — AB
LEUKOCYTES UA: NEGATIVE
Nitrite: NEGATIVE
Nitrite: NEGATIVE
PROTEIN: NEGATIVE mg/dL
PROTEIN: NEGATIVE mg/dL
Specific Gravity, Urine: 1.017 (ref 1.005–1.030)
Specific Gravity, Urine: 1.02 (ref 1.005–1.030)
WBC UA: NONE SEEN WBC/hpf (ref 0–5)
pH: 6 (ref 5.0–8.0)
pH: 6 (ref 5.0–8.0)

## 2017-07-21 LAB — POCT FERN TEST: POCT FERN TEST: NEGATIVE

## 2017-07-21 LAB — WET PREP, GENITAL
CLUE CELLS WET PREP: NONE SEEN
Trich, Wet Prep: NONE SEEN

## 2017-07-21 MED ORDER — BETAMETHASONE SOD PHOS & ACET 6 (3-3) MG/ML IJ SUSP
12.0000 mg | Freq: Once | INTRAMUSCULAR | Status: AC
  Administered 2017-07-21: 12 mg via INTRAMUSCULAR
  Filled 2017-07-21: qty 2

## 2017-07-21 NOTE — Discharge Instructions (Signed)

## 2017-07-21 NOTE — MAU Note (Signed)
Pt reports going to bathroom and saw some blood on tissue. Was here last night for contractions and was told she had a short cervix. Pt also state when she went to bathroom here, she noticed her underwear were wet-concerned her water broke. Pt reports some lower abdominal cramping. Reports good fetal movement.

## 2017-07-21 NOTE — MAU Provider Note (Signed)
History     CSN: [id number]  Arrival date and time: 07/21/17 4132   First Provider Initiated Contact with Patient 07/21/17 [phone]      Chief Complaint  Patient presents with  . Vaginal Bleeding   Margaret Cain is a 31 y.o.[id number] at [redacted]w[redacted]d who presents today with spotting. She was here last night and started on vaginal progesterone. She is also doing 17-p injections, but she has not had one in over a month. She states that she had one injection, and then the appointments got mixed up. So, she has not had one since. She denies any intercourse in the last 24 hours. She reports normal fetal movement. She is also unsure if she peed herself or had some leaking of fluid.    Vaginal Bleeding  The patient's primary symptoms include vaginal bleeding and vaginal discharge. This is a new problem. The current episode started today (around 0300). The problem has been unchanged. The patient is experiencing no pain. She is pregnant. Pertinent negatives include no chills, dysuria, fever, frequency, nausea, urgency or vomiting. The vaginal bleeding is spotting (on my underwear, and when I wiped. ). She has not been passing clots. She has not been passing tissue.      Past Medical History:  Diagnosis Date  . Anemia   . Anxiety   . Depression    ppd with first child  . Gestational diabetes    diet controlled  . Headache(784.0)   . Hx of chlamydia infection   . Positive GBS test 03/18/2017  . Pregnancy induced hypertension   . Preterm labor   . Trichomonosis   . Urinary tract infection   . 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 COLPOSCOPY      Family History  Problem Relation Age of Onset  . Asthma Son   . Asthma Paternal Aunt   . Anesthesia problems Neg Hx   . Hypotension Neg Hx   . Malignant hyperthermia Neg Hx   . Pseudochol deficiency Neg Hx   . Other Neg Hx   . Cancer Neg Hx   . Heart disease Neg Hx   .  Stroke Neg Hx   . Hearing loss Neg Hx     Social History  Substance Use Topics  . Smoking status: Never Smoker  . Smokeless tobacco: Never Used     Comment: Pt reports she has never smoked  . Alcohol use No    Allergies: No Known Allergies  Prescriptions Prior to Admission  Medication Sig Dispense Refill Last Dose  . acetaminophen (TYLENOL) 500 MG tablet Take 500 mg by mouth every 6 (six) hours as needed for mild pain or headache.   Past Month at Unknown time  . Butalbital-APAP-Caffeine (FIORICET PO) Take by mouth as needed.   Past Month at Unknown time  . cephALEXin (KEFLEX) 500 MG capsule Take 1 capsule (500 mg total) by mouth 4 (four) times daily. 28 capsule 0 07/20/2017 at Unknown time  . cyclobenzaprine (FLEXERIL) 10 MG tablet Take 1 tablet (10 mg total) by mouth 3 (three) times daily as needed for muscle spasms. 30 tablet 2 Past Month at Unknown time  . Doxylamine-Pyridoxine (DICLEGIS) 10-10 MG TBEC Take 2 tabs at bedtime. May take 1 tab at breakfast if needed & may add 1 tab to lunch if needed 100 tablet 6 07/20/2017 at Unknown time  . Prenatal Vit-Fe Phos-FA-Omega (VITAFOL GUMMIES) 3.33-0.333-34.8  MG CHEW Chew 1 tablet by mouth daily.  38 07/20/2017 at Unknown time  . progesterone (PROMETRIUM) 200 MG capsule Place one capsule vaginally at bedtime 30 capsule 0 07/20/2017 at Unknown time  . Elastic Bandages & Supports (ABDOMINAL SUPPORT/L-XL) MISC 1 Units by Does not apply route as needed. (Patient not taking: Reported on 07/06/2017) 1 each 0 Not Taking  . Elastic Bandages & Supports (COMFORT FIT MATERNITY SUPP SM) MISC 1 Units by Does not apply route daily as needed. (Patient not taking: Reported on 07/06/2017) 1 each 0 Not Taking    Review of Systems  Constitutional: Negative for chills and fever.  Gastrointestinal: Negative for nausea and vomiting.  Genitourinary: Positive for vaginal bleeding and vaginal discharge. Negative for dysuria, frequency and urgency.   Physical Exam    Blood pressure (!) 97/54, pulse 75, temperature (!) 97.5 F (36.4 C), resp. rate 18, height 5' (1.524 m), weight 117 lb (53.1 kg), last menstrual period 02/01/2017, unknown if currently breastfeeding.  Physical Exam  Nursing note and vitals reviewed. Constitutional: She is oriented to person, place, and time. She appears well-developed and well-nourished. No distress.  HENT:  Head: Normocephalic.  Cardiovascular: Normal rate.   Respiratory: Effort normal.  GI: Soft. There is no tenderness. There is no rebound.  Genitourinary:  Genitourinary Comments:  External: no lesion Vagina: small amount of pink discharge. No pooling  Cervix: pink, smooth, 1 at ext os/closed at int os/50/-3 Uterus: AGA   Neurological: She is alert and oriented to person, place, and time.  Skin: Skin is warm and dry.  Psychiatric: She has a normal mood and affect.   Fht: 135, moderate with 10x10 accels, no decels Toco: no UCs  MAU Course  Procedures  MDM No change from last night Will give BMZ today and in 24 hours FU as per planned tomorrow  Assessment and Plan   1. Threatened premature labor in second trimester   2. [redacted] weeks gestation of pregnancy   3. Spotting in pregnancy    DC home Comfort measures reviewed  3rd Trimester precautions  PTL precautions  Fetal kick counts RX: none  Return to MAU as needed FU with OB as planned Return tomorrow for 2nd BMZ injection   Follow-up Information    [facility] [place] MATERNITY ADMISSIONS Follow up.   Why:  Return tomorrow for steroid shot  Contact information: [address] [id number] mc [place] [address] [phone]           Thressa Sheller 07/21/2017, 6:32 AM

## 2017-07-21 NOTE — Discharge Instructions (Signed)

## 2017-07-21 NOTE — MAU Note (Signed)
Pt reports leaking of fluid since 1700. Pt reports some thick white discharge along with a sticky clear discharge. Pt reports back pain 6/10, but denies UC's.  Pt is worried about going into preterm labor because she had a shortened cervix on [place] 2 days ago. She was here in MAU this morning for spotting and received betamethasone.

## 2017-07-21 NOTE — MAU Provider Note (Signed)
History   [id number] @ 24.2 wks in with c/o low abd pain since last night also pinkish spotting. Also has whitish vaginal discharge and is concerned she might be leaking her fluid.  CSN: [id number]  Arrival date & time 07/21/17  1753   First Provider Initiated Contact with Patient 07/21/17 1839      Chief Complaint  Patient presents with  . Vaginal Discharge    HPI  Past Medical History:  Diagnosis Date  . Anemia   . Anxiety   . Depression    ppd with first child  . Gestational diabetes    diet controlled  . Headache(784.0)   . Hx of chlamydia infection   . Positive GBS test 03/18/2017  . Pregnancy induced hypertension   . Preterm labor   . Trichomonosis   . Urinary tract infection   . 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 COLPOSCOPY      Family History  Problem Relation Age of Onset  . Asthma Son   . Asthma Paternal Aunt   . Anesthesia problems Neg Hx   . Hypotension Neg Hx   . Malignant hyperthermia Neg Hx   . Pseudochol deficiency Neg Hx   . Other Neg Hx   . Cancer Neg Hx   . Heart disease Neg Hx   . Stroke Neg Hx   . Hearing loss Neg Hx     Social History  Substance Use Topics  . Smoking status: Never Smoker  . Smokeless tobacco: Never Used     Comment: Pt reports she has never smoked  . Alcohol use No    OB History    Gravida Para Term Preterm AB Living   SAB TAB Ectopic Multiple Live Births   1 0 0 0 6      Review of Systems  Constitutional: Negative.   HENT: Negative.   Eyes: Negative.   Respiratory: Negative.   Gastrointestinal: Positive for abdominal pain.  Endocrine: Negative.   Genitourinary: Positive for vaginal discharge.  Musculoskeletal: Negative.   Skin: Negative.   Allergic/Immunologic: Negative.   Neurological: Negative.   Hematological: Negative.   Psychiatric/Behavioral: Negative.     Allergies  Patient has no known  allergies.  Home Medications    BP 116/64   Pulse (!) 108   Temp 98.5 F (36.9 C)   Resp 18   LMP 02/01/2017 (Approximate)   Physical Exam  Constitutional: She is oriented to person, place, and time. She appears well-developed and well-nourished.  HENT:  Head: Normocephalic.  Eyes: Pupils are equal, round, and reactive to light.  Neck: Normal range of motion.  Cardiovascular: Normal rate, regular rhythm, normal heart sounds and intact distal pulses.   Pulmonary/Chest: Effort normal and breath sounds normal.  Abdominal: Soft. Bowel sounds are normal.  Genitourinary: Vaginal discharge found.  Musculoskeletal: Normal range of motion.  Neurological: She is alert and oriented to person, place, and time. She has normal reflexes.  Skin: Skin is warm and dry.  Psychiatric: She has a normal mood and affect. Her behavior is normal. Judgment and thought content normal.    MAU Course  Procedures (including critical care time)  Labs Reviewed  URINALYSIS, ROUTINE W REFLEX MICROSCOPIC - Abnormal; Notable for the following:       Result Value   APPearance HAZY (*)    Glucose, UA 150 (*)  Ketones, ur 5 (*)    All other components within normal limits  WET PREP, GENITAL  GC/CHLAMYDIA PROBE AMP ([place]) NOT AT [facility]   [place] Mfm Ob Transvaginal  Result Date: 07/20/2017 ----------------------------------------------------------------------  OBSTETRICS REPORT                      (Signed Final 07/20/2017 08:11 am) ---------------------------------------------------------------------- Patient Info  ID #:       [id number]                          D.O.B.:  02-Nov-1985 (31 yrs)  Name:       Margaret Cain [facility]                 Visit Date: 07/20/2017 12:33 am ---------------------------------------------------------------------- Performed By  Performed By:     Lenise Arena        Secondary Phy.:   Carlyon Shadow                    RDMS                                                             CARSON NP   Attending:        Ledon Snare MD         Address:          [address]                                                             [place]                                                             [id number]  Referred By:      [facility]       Location:         [facility] for                    [facility]                    Healthcare  Ref. Address:     [facility]  [address]                    [place], [place]                    [address] ---------------------------------------------------------------------- Orders   #  Description                                 Code   1  [place] MFM OB TRANSVAGINAL                      60454.0  ----------------------------------------------------------------------   #  Ordered By               Order #        Accession #    Episode #   1  Judeth Horn            [id number]      [id number]     [id number]  ---------------------------------------------------------------------- Indications   [redacted] weeks gestation of pregnancy                Z3A.24   Preterm contractions                           O47.00  ---------------------------------------------------------------------- OB History  Blood Type:            Height:  5'1"   Weight (lb):  98        BMI:  18.51  Gravidity:    8         Term:   5        Prem:   1        SAB:   1  TOP:          0       Ectopic:  0        Living: 6 ---------------------------------------------------------------------- Fetal Evaluation  Num Of Fetuses:     1  Fetal Heart         140  Rate(bpm):  Cardiac Activity:   Observed  Presentation:       Transverse, head to maternal left  Placenta:           Anterior, above cervical os  P. Cord Insertion:  Previously Visualized  Amniotic Fluid  AFI FV:      Subjectively within normal limits                              Largest Pocket(cm)                               7.13 ---------------------------------------------------------------------- Gestational Age  LMP:           24w 1d        Date:  02/01/17                 EDD:   11/08/17  Best:          24w 1d     Det. By:  LMP  (02/01/17)          EDD:   11/08/17 ---------------------------------------------------------------------- Anatomy  Thoracic:  Appears normal         Bladder:                Appears normal  Stomach:               Appears normal, left                         sided ---------------------------------------------------------------------- Cervix Uterus Adnexa  Cervix  Length:            2.4  cm.  Normal appearance by transvaginal scan  Uterus  No abnormality visualized. ---------------------------------------------------------------------- Impression  Singleton intrauterine pregnancy at 24 weeks 1 day gestation  with fetal cardiac activity  Cephalic presentation  Anterior placenta without evidence of previa  Cervical length 2.4 cm ---------------------------------------------------------------------- Recommendations  Follow-up ultrasounds as clinically indicated. ----------------------------------------------------------------------                   Ledon Snare, MD Electronically Signed Final Report   07/20/2017 08:11 am ----------------------------------------------------------------------    1. Abdominal pain in pregnancy, second trimester   2. Round ligament pain   3. Back pain affecting pregnancy in second trimester   4. GBS bacteriuria   5. Vaginal discharge during pregnancy in second trimester       MDM  VSS, sterile spec exam done, wet prep neg, and cultures obtained. Fern neg, no loss of fluid with valsalva or strong cough. Lengthy discussion with pt regarding the effects of prometrium vaginally including vaginal irritation. Cervix is cl/firm post /high. FHR pattern is stable. U/s WNL will d/c home

## 2017-07-21 NOTE — MAU Note (Addendum)
Went to [facility] this am and saw pink when I wiped. Was seen Thurs night and had short cervix. Started Prometrium last night. Some mild crampiness

## 2017-07-22 ENCOUNTER — Inpatient Hospital Stay (HOSPITAL_COMMUNITY)
Admission: AD | Admit: 2017-07-22 | Discharge: 2017-07-22 | Disposition: A | Discharge status: Home / Self Care | Payer: Medicaid Other | Source: Ambulatory Visit | Attending: Obstetrics & Gynecology | Admitting: Obstetrics & Gynecology

## 2017-07-22 DIAGNOSIS — O9989 Other specified diseases and conditions complicating pregnancy, childbirth and the puerperium: Secondary | ICD-10-CM

## 2017-07-22 DIAGNOSIS — O26892 Other specified pregnancy related conditions, second trimester: Secondary | ICD-10-CM | POA: Insufficient documentation

## 2017-07-22 DIAGNOSIS — R109 Unspecified abdominal pain: Secondary | ICD-10-CM | POA: Diagnosis not present

## 2017-07-22 DIAGNOSIS — M549 Dorsalgia, unspecified: Secondary | ICD-10-CM

## 2017-07-22 DIAGNOSIS — Z3A24 24 weeks gestation of pregnancy: Secondary | ICD-10-CM | POA: Insufficient documentation

## 2017-07-22 DIAGNOSIS — N949 Unspecified condition associated with female genital organs and menstrual cycle: Secondary | ICD-10-CM

## 2017-07-22 DIAGNOSIS — R8271 Bacteriuria: Secondary | ICD-10-CM

## 2017-07-22 MED ORDER — BETAMETHASONE SOD PHOS & ACET 6 (3-3) MG/ML IJ SUSP
12.0000 mg | Freq: Once | INTRAMUSCULAR | Status: AC
  Administered 2017-07-22: 12 mg via INTRAMUSCULAR
  Filled 2017-07-22: qty 2

## 2017-07-22 NOTE — MAU Note (Signed)
Here for 2nd dose of betamethasone.  Did ok with dose yesterday.  Pt asking about medication she is taking vaginally, had some light bleeding last night, none this morning.  No c/o at this time

## 2017-07-23 ENCOUNTER — Telehealth: Payer: Self-pay | Admitting: Family Medicine

## 2017-07-23 LAB — GC/CHLAMYDIA PROBE AMP (~~LOC~~) NOT AT ARMC
Chlamydia: NEGATIVE
Neisseria Gonorrhea: NEGATIVE

## 2017-07-23 NOTE — Telephone Encounter (Signed)
Message off the fax machine this Monday Morning.  Patient state is is having some vaginal bleeding and water and leaking fluids

## 2017-07-24 NOTE — Telephone Encounter (Signed)
Spoke to pt and she had went to ED to be seen pt stated that she was doing better.

## 2017-07-25 ENCOUNTER — Encounter: Payer: Self-pay | Admitting: Family Medicine

## 2017-07-25 ENCOUNTER — Inpatient Hospital Stay (HOSPITAL_COMMUNITY)
Admission: EM | Admit: 2017-07-25 | Discharge: 2017-07-25 | Disposition: A | Discharge status: Other Acute Inpatient Hospital | Payer: Medicaid Other | Attending: Obstetrics and Gynecology | Admitting: Obstetrics and Gynecology

## 2017-07-25 ENCOUNTER — Ambulatory Visit (INDEPENDENT_AMBULATORY_CARE_PROVIDER_SITE_OTHER): Payer: Medicaid Other | Admitting: Advanced Practice Midwife

## 2017-07-25 ENCOUNTER — Encounter (HOSPITAL_COMMUNITY): Payer: Self-pay | Admitting: *Deleted

## 2017-07-25 ENCOUNTER — Emergency Department (HOSPITAL_COMMUNITY): Payer: Medicaid Other

## 2017-07-25 VITALS — BP 106/64 | HR 78 | Wt 115.5 lb

## 2017-07-25 DIAGNOSIS — Z23 Encounter for immunization: Secondary | ICD-10-CM

## 2017-07-25 DIAGNOSIS — Y999 Unspecified external cause status: Secondary | ICD-10-CM | POA: Insufficient documentation

## 2017-07-25 DIAGNOSIS — Z8751 Personal history of pre-term labor: Secondary | ICD-10-CM

## 2017-07-25 DIAGNOSIS — S39012A Strain of muscle, fascia and tendon of lower back, initial encounter: Secondary | ICD-10-CM

## 2017-07-25 DIAGNOSIS — O0992 Supervision of high risk pregnancy, unspecified, second trimester: Secondary | ICD-10-CM

## 2017-07-25 DIAGNOSIS — Y92411 Interstate highway as the place of occurrence of the external cause: Secondary | ICD-10-CM | POA: Insufficient documentation

## 2017-07-25 DIAGNOSIS — S199XXA Unspecified injury of neck, initial encounter: Secondary | ICD-10-CM | POA: Diagnosis present

## 2017-07-25 DIAGNOSIS — Y9389 Activity, other specified: Secondary | ICD-10-CM | POA: Insufficient documentation

## 2017-07-25 DIAGNOSIS — O09212 Supervision of pregnancy with history of pre-term labor, second trimester: Secondary | ICD-10-CM

## 2017-07-25 DIAGNOSIS — Z3A25 25 weeks gestation of pregnancy: Secondary | ICD-10-CM | POA: Insufficient documentation

## 2017-07-25 DIAGNOSIS — R8271 Bacteriuria: Secondary | ICD-10-CM

## 2017-07-25 DIAGNOSIS — O0990 Supervision of high risk pregnancy, unspecified, unspecified trimester: Secondary | ICD-10-CM

## 2017-07-25 DIAGNOSIS — S161XXA Strain of muscle, fascia and tendon at neck level, initial encounter: Secondary | ICD-10-CM

## 2017-07-25 LAB — COMPREHENSIVE METABOLIC PANEL
ALBUMIN: 3 g/dL — AB (ref 3.5–5.0)
ALT: 11 U/L — ABNORMAL LOW (ref 14–54)
ANION GAP: 8 (ref 5–15)
AST: 16 U/L (ref 15–41)
Alkaline Phosphatase: 48 U/L (ref 38–126)
BUN: <5 mg/dL — ABNORMAL LOW (ref 6–20)
CHLORIDE: 104 mmol/L (ref 101–111)
CO2: 25 mmol/L (ref 22–32)
Calcium: 8.4 mg/dL — ABNORMAL LOW (ref 8.9–10.3)
Creatinine, Ser: 0.49 mg/dL (ref 0.44–1.00)
GFR calc Af Amer: >60 mL/min (ref >60)
GFR calc non Af Amer: >60 mL/min (ref >60)
GLUCOSE: 102 mg/dL — AB (ref 65–99)
POTASSIUM: 3.2 mmol/L — AB (ref 3.5–5.1)
SODIUM: 137 mmol/L (ref 135–145)
Total Bilirubin: 0.5 mg/dL (ref 0.3–1.2)
Total Protein: 6.3 g/dL — ABNORMAL LOW (ref 6.5–8.1)

## 2017-07-25 LAB — URINALYSIS, ROUTINE W REFLEX MICROSCOPIC
Bilirubin Urine: NEGATIVE
Glucose, UA: NEGATIVE mg/dL
Hgb urine dipstick: NEGATIVE
KETONES UR: NEGATIVE mg/dL
LEUKOCYTES UA: NEGATIVE
NITRITE: NEGATIVE
PH: 8 (ref 5.0–8.0)
Protein, ur: NEGATIVE mg/dL
SPECIFIC GRAVITY, URINE: 1.014 (ref 1.005–1.030)

## 2017-07-25 LAB — I-STAT CHEM 8, ED
BUN: 3 mg/dL — ABNORMAL LOW (ref 6–20)
CHLORIDE: 101 mmol/L (ref 101–111)
CREATININE: 0.4 mg/dL — AB (ref 0.44–1.00)
Calcium, Ion: 1.12 mmol/L — ABNORMAL LOW (ref 1.15–1.40)
GLUCOSE: 100 mg/dL — AB (ref 65–99)
HEMATOCRIT: 29 % — AB (ref 36.0–46.0)
Hemoglobin: 9.9 g/dL — ABNORMAL LOW (ref 12.0–15.0)
Potassium: 3.3 mmol/L — ABNORMAL LOW (ref 3.5–5.1)
Sodium: 139 mmol/L (ref 135–145)
TCO2: 25 mmol/L (ref 22–32)

## 2017-07-25 LAB — CBC WITH DIFFERENTIAL/PLATELET
BASOS PCT: 0 %
Basophils Absolute: 0 10*3/uL (ref 0.0–0.1)
EOS ABS: 0.2 10*3/uL (ref 0.0–0.7)
EOS PCT: 2 %
HCT: 29.3 % — ABNORMAL LOW (ref 36.0–46.0)
HEMOGLOBIN: 9.4 g/dL — AB (ref 12.0–15.0)
Lymphocytes Relative: 17 %
Lymphs Abs: 1.8 10*3/uL (ref 0.7–4.0)
MCH: 28.1 pg (ref 26.0–34.0)
MCHC: 32.1 g/dL (ref 30.0–36.0)
MCV: 87.5 fL (ref 78.0–100.0)
MONOS PCT: 11 %
Monocytes Absolute: 1.2 10*3/uL — ABNORMAL HIGH (ref 0.1–1.0)
NEUTROS PCT: 70 %
Neutro Abs: 7.3 10*3/uL (ref 1.7–7.7)
PLATELETS: 192 10*3/uL (ref 150–400)
RBC: 3.35 MIL/uL — ABNORMAL LOW (ref 3.87–5.11)
RDW: 12.6 % (ref 11.5–15.5)
WBC: 10.4 10*3/uL (ref 4.0–10.5)

## 2017-07-25 LAB — ABO/RH: ABO/RH(D): O POS

## 2017-07-25 MED ORDER — SODIUM CHLORIDE 0.9 % IV BOLUS (SEPSIS)
1000.0000 mL | Freq: Once | INTRAVENOUS | Status: AC
  Administered 2017-07-25: 1000 mL via INTRAVENOUS

## 2017-07-25 MED ORDER — ACETAMINOPHEN 500 MG PO TABS
1000.0000 mg | ORAL_TABLET | Freq: Once | ORAL | Status: AC
  Administered 2017-07-25: 1000 mg via ORAL
  Filled 2017-07-25: qty 2

## 2017-07-25 MED ORDER — CEPHALEXIN 250 MG/5ML PO SUSR: 500.0000 mg | Freq: Four times a day (QID) | ORAL | 0 refills | Status: AC

## 2017-07-25 MED ORDER — CYCLOBENZAPRINE HCL 10 MG PO TABS
10.0000 mg | ORAL_TABLET | Freq: Once | ORAL | Status: AC
  Administered 2017-07-25: 10 mg via ORAL
  Filled 2017-07-25: qty 1
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
## 2017-07-25 NOTE — Progress Notes (Signed)
Chaplain visited briefly with PT and got next of Kin info and made the call to inform them of where PT is.

## 2017-07-25 NOTE — Discharge Instructions (Signed)
Low Back Strain A strain is a stretch or tear in a muscle or the strong cords of tissue that attach muscle to bone (tendons). Strains of the lower back (lumbar spine) are a common cause of low back pain. A strain occurs when muscles or tendons are torn or are stretched beyond their limits. The muscles may become inflamed, resulting in pain and sudden muscle tightening (spasms). A strain can happen suddenly due to an injury (trauma), or it can develop gradually due to overuse. There are three types of strains:  Grade 1 is a mild strain involving a minor tear of the muscle fibers or tendons. This may cause some pain but no loss of muscle strength.  Grade 2 is a moderate strain involving a partial tear of the muscle fibers or tendons. This causes more severe pain and some loss of muscle strength.  Grade 3 is a severe strain involving a complete tear of the muscle or tendon. This causes severe pain and complete or nearly complete loss of muscle strength. What are the causes? This condition may be caused by:  Trauma, such as a fall or a hit to the body.  Twisting or overstretching the back. This may result from doing activities that require a lot of energy, such as lifting heavy objects. What increases the risk? The following factors may increase your risk of getting this condition:  Playing contact sports.  Participating in sports or activities that put excessive stress on the back and require a lot of bending and twisting, including:  Lifting weights or heavy objects.  Gymnastics.  Soccer.  Figure skating.  Snowboarding.  Being overweight or obese.  Having poor strength and flexibility. What are the signs or symptoms? Symptoms of this condition may include:  Sharp or dull pain in the lower back that does not go away. Pain may extend to the buttocks.  Stiffness.  Limited range of motion.  Inability to stand up straight due to stiffness or pain.  Muscle spasms. How is this  diagnosed?   This condition may be diagnosed based on:  Your symptoms.  Your medical history.  A physical exam.  Your health care provider may push on certain areas of your back to determine the source of your pain.  You may be asked to bend forward, backward, and side to side to assess the severity of your pain and your range of motion.  Imaging tests, such as:  X-rays.  MRI. How is this treated? Treatment for this condition may include:  Applying heat and cold to the affected area.  Medicines to help relieve pain and to relax your muscles (muscle relaxants).  NSAIDs to help reduce swelling and discomfort.  Physical therapy. When your symptoms improve, it is important to gradually return to your normal routine as soon as possible to reduce pain, avoid stiffness, and avoid loss of muscle strength. Generally, symptoms should improve within 6 weeks of treatment. However, recovery time varies. Follow these instructions at home: Managing pain, stiffness, and swelling  If directed, apply ice to the injured area during the first 24 hours after your injury.  Put ice in a plastic bag.  Place a towel between your skin and the bag.  Leave the ice on for 20 minutes, 2-3 times a day.  If directed, apply heat to the affected area as often as told by your health care provider. Use the heat source that your health care provider recommends, such as a moist heat pack or a heating pad.    Place a towel between your skin and the heat source.  Leave the heat on for 20-30 minutes.  Remove the heat if your skin turns bright red. This is especially important if you are unable to feel pain, heat, or cold. You may have a greater risk of getting burned. Activity  Rest and return to your normal activities as told by your health care provider. Ask your health care provider what activities are safe for you.  Avoid activities that take a lot of effort (are strenuous) for as long as told by your  health care provider.  Do exercises as told by your health care provider. General instructions  Take over-the-counter and prescription medicines only as told by your health care provider.  If you have questions or concerns about safety while taking pain medicine, talk with your health care provider.  Do not drive or operate heavy machinery until you know how your pain medicine affects you.  Do not use any tobacco products, such as cigarettes, chewing tobacco, and e-cigarettes. Tobacco can delay bone healing. If you need help quitting, ask your health care provider.  Keep all follow-up visits as told by your health care provider. This is important. How is this prevented?  Warm up and stretch before being active.  Cool down and stretch after being active.  Give your body time to rest between periods of activity.  Avoid:  Being physically inactive for long periods at a time.  Exercising or playing sports when you are tired or in pain.  Use correct form when playing sports and lifting heavy objects.  Use good posture when sitting and standing.  Maintain a healthy weight.  Sleep on a mattress with medium firmness to support your back.  Make sure to use equipment that fits you, including shoes that fit well.  Be safe and responsible while being active to avoid falls.  Do at least 150 minutes of moderate-intensity exercise each week, such as brisk walking or water aerobics. Try a form of exercise that takes stress off your back, such as swimming or stationary cycling.  Maintain physical fitness, including:  Strength.  Flexibility.  Cardiovascular fitness.  Endurance. Contact a health care provider if:  Your back pain does not improve after 6 weeks of treatment.  Your symptoms get worse. Get help right away if:  Your back pain is severe.  You are unable to stand or walk.  You develop pain in your legs.  You develop weakness in your buttocks or legs.  You have  difficulty controlling when you urinate or when you have a bowel movement. This information is not intended to replace advice given to you by your health care provider. Make sure you discuss any questions you have with your health care provider. Document Released: 10/02/2005 Document Revised: 06/08/2016 Document Reviewed: 07/14/2015 Elsevier Interactive Patient Education  2017 [employer]vier [employer]  

## 2017-07-25 NOTE — ED Notes (Signed)
Rapid OB at bedside 

## 2017-07-25 NOTE — Patient Instructions (Signed)
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
## 2017-07-25 NOTE — ED Provider Notes (Signed)
Patient was awaiting transfer to [facility].  RN indicates [facility] now coming for transport.  Pt awake and alert. Comfortable appearing.  Pt appears stable for transport.    Cathren Laine, MD 07/25/17 [phone]
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
## 2017-07-25 NOTE — Progress Notes (Signed)
   PRENATAL VISIT NOTE  Subjective:  Margaret Cain is a 31 y.o. [id number] at [redacted]w[redacted]d being seen today for ongoing prenatal care.  She is currently monitored for the following issues for this high-risk pregnancy and has Smoker; Pica; History of preterm delivery; ASCUS with positive high risk HPV cervical; Anemia affecting pregnancy, antepartum; GBS bacteriuria; Supervision of high risk pregnancy, antepartum; History of gestational diabetes; Migraine headache; Round ligament pain; and Back pain affecting pregnancy on her problem list.  Patient reports occasional contractions and spotting.  Contractions: Irregular. Vag. Bleeding: Scant.  Movement: Present. Denies leaking of fluid.   The following portions of the patient's history were reviewed and updated as appropriate: allergies, current medications, past family history, past medical history, past social history, past surgical history and problem list. Problem list updated.  Objective:   Vitals:   07/25/17 1111  BP: 106/64  Pulse: 78  Weight: 115 lb 8 oz (52.4 kg)    Fetal Status: Fetal Heart Rate (bpm): 135   Movement: Present  Presentation: Vertex  General:  Alert, oriented and cooperative. Patient is in no acute distress.  Skin: Skin is warm and dry. No rash noted.   Cardiovascular: Normal heart rate noted  Respiratory: Normal respiratory effort, no problems with respiration noted  Abdomen: Soft, gravid, appropriate for gestational age.  Pain/Pressure: Present     Pelvic: Cervical exam performed Dilation: Closed Effacement (%): 0 Station: Ballotable  Extremities: Normal range of motion.  Edema: None  Mental Status:  Normal mood and affect. Normal behavior. Normal judgment and thought content.   Assessment and Plan:  Pregnancy: [id number] at [redacted]w[redacted]d  1. GBS bacteriuria  - Culture, OB Urine  2. Need for immunization against influenza  - Flu Vaccine QUAD 6+ mos IM (Fluarix)  Preterm labor symptoms and general obstetric precautions  including but not limited to vaginal bleeding, contractions, leaking of fluid and fetal movement were reviewed in detail with the patient. Please refer to After Visit Summary for other counseling recommendations.  Return in about 3 weeks (around 08/15/2017) for ROB/GTT.   Dorathy Kinsman, CNM

## 2017-07-25 NOTE — Progress Notes (Signed)
Pt to CT for CT scan of the neck
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
## 2017-07-26 ENCOUNTER — Encounter (HOSPITAL_COMMUNITY): Payer: Self-pay | Admitting: *Deleted

## 2017-07-27 ENCOUNTER — Inpatient Hospital Stay (HOSPITAL_COMMUNITY): Admission: RE | Admit: 2017-07-27 | Payer: Medicaid Other | Source: Ambulatory Visit

## 2017-07-31 LAB — URINE CULTURE, OB REFLEX

## 2017-07-31 LAB — CULTURE, OB URINE

## 2017-08-01 ENCOUNTER — Other Ambulatory Visit: Payer: Self-pay | Admitting: Obstetrics and Gynecology

## 2017-08-01 ENCOUNTER — Ambulatory Visit (HOSPITAL_COMMUNITY)
Admission: RE | Admit: 2017-08-01 | Discharge: 2017-08-01 | Disposition: A | Discharge status: Home / Self Care | Payer: Medicaid Other | Source: Ambulatory Visit | Attending: Obstetrics and Gynecology | Admitting: Obstetrics and Gynecology

## 2017-08-01 ENCOUNTER — Encounter (HOSPITAL_COMMUNITY): Payer: Self-pay

## 2017-08-01 DIAGNOSIS — Z3A25 25 weeks gestation of pregnancy: Secondary | ICD-10-CM

## 2017-08-01 DIAGNOSIS — O26872 Cervical shortening, second trimester: Secondary | ICD-10-CM

## 2017-08-01 DIAGNOSIS — N949 Unspecified condition associated with female genital organs and menstrual cycle: Secondary | ICD-10-CM

## 2017-08-01 DIAGNOSIS — M549 Dorsalgia, unspecified: Secondary | ICD-10-CM

## 2017-08-01 DIAGNOSIS — O09212 Supervision of pregnancy with history of pre-term labor, second trimester: Secondary | ICD-10-CM | POA: Insufficient documentation

## 2017-08-01 DIAGNOSIS — F17200 Nicotine dependence, unspecified, uncomplicated: Secondary | ICD-10-CM | POA: Insufficient documentation

## 2017-08-01 DIAGNOSIS — O09292 Supervision of pregnancy with other poor reproductive or obstetric history, second trimester: Secondary | ICD-10-CM | POA: Diagnosis not present

## 2017-08-01 DIAGNOSIS — R8271 Bacteriuria: Secondary | ICD-10-CM

## 2017-08-01 DIAGNOSIS — Z8632 Personal history of gestational diabetes: Secondary | ICD-10-CM | POA: Insufficient documentation

## 2017-08-01 DIAGNOSIS — O99332 Smoking (tobacco) complicating pregnancy, second trimester: Secondary | ICD-10-CM | POA: Insufficient documentation

## 2017-08-01 DIAGNOSIS — O9989 Other specified diseases and conditions complicating pregnancy, childbirth and the puerperium: Secondary | ICD-10-CM

## 2017-08-06 ENCOUNTER — Ambulatory Visit (HOSPITAL_COMMUNITY): Payer: Medicaid Other

## 2017-08-07 ENCOUNTER — Encounter (HOSPITAL_COMMUNITY): Payer: Self-pay

## 2017-08-07 ENCOUNTER — Other Ambulatory Visit (HOSPITAL_COMMUNITY): Payer: Self-pay | Admitting: Obstetrics and Gynecology

## 2017-08-07 ENCOUNTER — Ambulatory Visit (HOSPITAL_COMMUNITY)
Admission: RE | Admit: 2017-08-07 | Discharge: 2017-08-07 | Disposition: A | Discharge status: Home / Self Care | Payer: Medicaid Other | Source: Ambulatory Visit | Attending: Family Medicine | Admitting: Family Medicine

## 2017-08-07 DIAGNOSIS — O09299 Supervision of pregnancy with other poor reproductive or obstetric history, unspecified trimester: Secondary | ICD-10-CM

## 2017-08-07 DIAGNOSIS — F17200 Nicotine dependence, unspecified, uncomplicated: Secondary | ICD-10-CM | POA: Insufficient documentation

## 2017-08-07 DIAGNOSIS — O09219 Supervision of pregnancy with history of pre-term labor, unspecified trimester: Secondary | ICD-10-CM

## 2017-08-07 DIAGNOSIS — O09899 Supervision of other high risk pregnancies, unspecified trimester: Secondary | ICD-10-CM

## 2017-08-07 DIAGNOSIS — O09292 Supervision of pregnancy with other poor reproductive or obstetric history, second trimester: Secondary | ICD-10-CM | POA: Diagnosis not present

## 2017-08-07 DIAGNOSIS — Z3A26 26 weeks gestation of pregnancy: Secondary | ICD-10-CM

## 2017-08-07 DIAGNOSIS — O09212 Supervision of pregnancy with history of pre-term labor, second trimester: Secondary | ICD-10-CM | POA: Insufficient documentation

## 2017-08-07 DIAGNOSIS — IMO0002 Reserved for concepts with insufficient information to code with codable children: Secondary | ICD-10-CM

## 2017-08-07 DIAGNOSIS — O99332 Smoking (tobacco) complicating pregnancy, second trimester: Secondary | ICD-10-CM

## 2017-08-07 DIAGNOSIS — Z362 Encounter for other antenatal screening follow-up: Secondary | ICD-10-CM | POA: Diagnosis not present

## 2017-08-07 DIAGNOSIS — Z0489 Encounter for examination and observation for other specified reasons: Secondary | ICD-10-CM

## 2017-08-10 ENCOUNTER — Other Ambulatory Visit: Payer: Self-pay | Admitting: Family Medicine

## 2017-08-17 ENCOUNTER — Encounter: Payer: Medicaid Other | Admitting: Family Medicine

## 2017-08-17 ENCOUNTER — Ambulatory Visit: Payer: Medicaid Other | Admitting: Neurology

## 2017-08-20 ENCOUNTER — Encounter: Payer: Medicaid Other | Admitting: Obstetrics & Gynecology

## 2017-08-20 ENCOUNTER — Ambulatory Visit (INDEPENDENT_AMBULATORY_CARE_PROVIDER_SITE_OTHER): Payer: Medicaid Other | Admitting: Obstetrics and Gynecology

## 2017-08-20 ENCOUNTER — Encounter: Payer: Self-pay | Admitting: Obstetrics and Gynecology

## 2017-08-20 VITALS — BP 119/70 | HR 92 | Wt 120.0 lb

## 2017-08-20 DIAGNOSIS — R8271 Bacteriuria: Secondary | ICD-10-CM

## 2017-08-20 DIAGNOSIS — O0990 Supervision of high risk pregnancy, unspecified, unspecified trimester: Secondary | ICD-10-CM

## 2017-08-20 DIAGNOSIS — Z23 Encounter for immunization: Secondary | ICD-10-CM | POA: Diagnosis present

## 2017-08-20 DIAGNOSIS — Z3009 Encounter for other general counseling and advice on contraception: Secondary | ICD-10-CM | POA: Insufficient documentation

## 2017-08-20 DIAGNOSIS — Z8751 Personal history of pre-term labor: Secondary | ICD-10-CM

## 2017-08-20 DIAGNOSIS — R87810 Cervical high risk human papillomavirus (HPV) DNA test positive: Secondary | ICD-10-CM

## 2017-08-20 DIAGNOSIS — R87610 Atypical squamous cells of undetermined significance on cytologic smear of cervix (ASC-US): Secondary | ICD-10-CM

## 2017-08-20 DIAGNOSIS — Z8632 Personal history of gestational diabetes: Secondary | ICD-10-CM

## 2017-08-20 DIAGNOSIS — O0993 Supervision of high risk pregnancy, unspecified, third trimester: Secondary | ICD-10-CM

## 2017-08-20 NOTE — Progress Notes (Signed)
Subjective:  Margaret Cain is a 31 y.o. [id number] at 6542w4d being seen today for ongoing prenatal care.  She is currently monitored for the following issues for this high-risk pregnancy and has Smoker; Pica; History of preterm delivery; ASCUS with positive high risk HPV cervical; Anemia affecting pregnancy, antepartum; GBS bacteriuria; Supervision of high risk pregnancy, antepartum; History of gestational diabetes; Migraine headache; Round ligament pain; Back pain affecting pregnancy; and Unwanted fertility on their problem list.  Patient reports occasional contractions.  Contractions: Not present. Vag. Bleeding: None.  Movement: Present. Denies leaking of fluid.   The following portions of the patient's history were reviewed and updated as appropriate: allergies, current medications, past family history, past medical history, past social history, past surgical history and problem list. Problem list updated.  Objective:   Vitals:   08/20/17 1249  BP: 119/70  Pulse: 92  Weight: 54.4 kg (120 lb)    Fetal Status: Fetal Heart Rate (bpm): 120   Movement: Present     General:  Alert, oriented and cooperative. Patient is in no acute distress.  Skin: Skin is warm and dry. No rash noted.   Cardiovascular: Normal heart rate noted  Respiratory: Normal respiratory effort, no problems with respiration noted  Abdomen: Soft, gravid, appropriate for gestational age. Pain/Pressure: Present     Pelvic:  Cervical exam performed        Extremities: Normal range of motion.  Edema: None  Mental Status: Normal mood and affect. Normal behavior. Normal judgment and thought content.   Urinalysis:      Assessment and Plan:  Pregnancy: [id number] at 6042w4d  1. Supervision of high risk pregnancy, antepartum, third trimester Stable - Glucose Tolerance, 2 Hours w/1 Hour - HIV antibody (with reflex) - RPR - CBC  2. Need for diphtheria-tetanus-pertussis (Tdap) vaccine  - Tdap vaccine greater than or equal to 7yo  IM  3. History of preterm delivery S/P BMZ x 2 Off 17 OHP On Prometrium PV Qhs Last CL 3.0 on U/S 10/23 4. History of gestational diabetes Glucola today  5. GBS bacteriuria Pt has not picked up Rx Encouraged to pick up Rx  6. Unwanted fertility BTL papers signed today  7. ASCUS with positive high risk HPV cervical S/P colpo, nl  Preterm labor symptoms and general obstetric precautions including but not limited to vaginal bleeding, contractions, leaking of fluid and fetal movement were reviewed in detail with the patient. Please refer to After Visit Summary for other counseling recommendations.  Return in about 2 weeks (around 09/03/2017) for OB visit.   Hermina StaggersErvin, Michael L, MD

## 2017-08-22 LAB — CBC
HEMATOCRIT: 31.8 % — AB (ref 34.0–46.6)
HEMOGLOBIN: 10.1 g/dL — AB (ref 11.1–15.9)
MCH: 27.8 pg (ref 26.6–33.0)
MCHC: 31.8 g/dL (ref 31.5–35.7)
MCV: 88 fL (ref 79–97)
Platelets: 191 10*3/uL (ref 150–379)
RBC: 3.63 x10E6/uL — AB (ref 3.77–5.28)
RDW: 13.4 % (ref 12.3–15.4)
WBC: 6.2 10*3/uL (ref 3.4–10.8)

## 2017-08-22 LAB — RPR: RPR: NONREACTIVE

## 2017-08-22 LAB — HIV ANTIBODY (ROUTINE TESTING W REFLEX): HIV Screen 4th Generation wRfx: NONREACTIVE

## 2017-08-23 ENCOUNTER — Encounter: Payer: Self-pay | Admitting: *Deleted

## 2017-08-27 ENCOUNTER — Other Ambulatory Visit: Payer: Medicaid Other

## 2017-08-27 DIAGNOSIS — O99019 Anemia complicating pregnancy, unspecified trimester: Secondary | ICD-10-CM

## 2017-08-27 DIAGNOSIS — Z8751 Personal history of pre-term labor: Secondary | ICD-10-CM

## 2017-08-28 LAB — GLUCOSE TOLERANCE, 2 HOURS W/ 1HR
Glucose, 1 hour: 81 mg/dL (ref 65–179)
Glucose, 2 hour: 68 mg/dL (ref 65–152)
Glucose, Fasting: 73 mg/dL (ref 65–91)

## 2017-08-29 LAB — SPECIMEN STATUS REPORT

## 2017-09-04 ENCOUNTER — Encounter: Payer: Self-pay | Admitting: General Practice

## 2017-09-04 ENCOUNTER — Ambulatory Visit (INDEPENDENT_AMBULATORY_CARE_PROVIDER_SITE_OTHER): Payer: Medicaid Other | Admitting: Medical

## 2017-09-04 VITALS — BP 104/64 | HR 94 | Wt 123.0 lb

## 2017-09-04 DIAGNOSIS — R87610 Atypical squamous cells of undetermined significance on cytologic smear of cervix (ASC-US): Secondary | ICD-10-CM

## 2017-09-04 DIAGNOSIS — Z8751 Personal history of pre-term labor: Secondary | ICD-10-CM

## 2017-09-04 DIAGNOSIS — O0990 Supervision of high risk pregnancy, unspecified, unspecified trimester: Secondary | ICD-10-CM

## 2017-09-04 DIAGNOSIS — R87810 Cervical high risk human papillomavirus (HPV) DNA test positive: Secondary | ICD-10-CM

## 2017-09-04 DIAGNOSIS — R8271 Bacteriuria: Secondary | ICD-10-CM

## 2017-09-04 DIAGNOSIS — N949 Unspecified condition associated with female genital organs and menstrual cycle: Secondary | ICD-10-CM

## 2017-09-04 NOTE — Progress Notes (Signed)
   PRENATAL VISIT NOTE  Subjective:  Janese Cline CoolsM Advincula is a 31 y.o. [id number] at 2661w5d being seen today for ongoing prenatal care.  She is currently monitored for the following issues for this high-risk pregnancy and has Smoker; Pica; History of preterm delivery; ASCUS with positive high risk HPV cervical; Anemia affecting pregnancy, antepartum; GBS bacteriuria; Supervision of high risk pregnancy, antepartum; History of gestational diabetes; Migraine headache; Round ligament pain; Back pain affecting pregnancy; and Unwanted fertility on their problem list.  Patient reports headache, nausea, occasional contractions and sharp intermittent abdominal pain.  Contractions: Irregular. Vag. Bleeding: None.  Movement: Present. Denies leaking of fluid.   The following portions of the patient's history were reviewed and updated as appropriate: allergies, current medications, past family history, past medical history, past social history, past surgical history and problem list. Problem list updated.  Objective:   Vitals:   09/04/17 0858  BP: 104/64  Pulse: 94  Weight: 123 lb (55.8 kg)    Fetal Status: Fetal Heart Rate (bpm): 136 Fundal Height: 29 cm Movement: Present     General:  Alert, oriented and cooperative. Patient is in no acute distress.  Skin: Skin is warm and dry. No rash noted.   Cardiovascular: Normal heart rate noted  Respiratory: Normal respiratory effort, no problems with respiration noted  Abdomen: Soft, gravid, appropriate for gestational age.  Pain/Pressure: Present     Pelvic: Cervical exam deferred        Extremities: Normal range of motion.  Edema: None  Mental Status:  Normal mood and affect. Normal behavior. Normal judgment and thought content.   Assessment and Plan:  Pregnancy: [id number] at 4361w5d  1. Supervision of high risk pregnancy, antepartum  2. Round ligament pain - Advised to try to get OTC abdominal binder or have pharmacy call us if additional information is needed  for Rx abdominal binder - discussed continued use of Tylenol PRN - discussed warm back for relief  3. GBS bacteriuria - Treat in labor  4. ASCUS with positive high risk HPV cervical - Need PP pap  5. History of preterm delivery - taking prometrium, no longer on 17-P - few braxton hicks contractions noted, no bleeding, LOF  Preterm labor symptoms and general obstetric precautions including but not limited to vaginal bleeding, contractions, leaking of fluid and fetal movement were reviewed in detail with the patient. Please refer to After Visit Summary for other counseling recommendations.  [facility] about 2 [facility] (around 09/18/2017[facility]   Vonzella NippleJulie Lannie Heaps, PA-C

## 2017-09-04 NOTE — Progress Notes (Signed)
Pt stated having sharp pain below abdominal, nausea, restless, and cant sleep.

## 2017-09-04 NOTE — Patient Instructions (Signed)
Round Ligament Pain °The round ligament is a cord of muscle and tissue that helps to support the uterus. It can become a source of pain during pregnancy if it becomes stretched or twisted as the baby grows. The pain usually begins in the second trimester of pregnancy, and it can come and go until the baby is delivered. It is not a serious problem, and it does not cause harm to the baby. °Round ligament pain is usually a short, sharp, and pinching pain, but it can also be a dull, lingering, and aching pain. The pain is felt in the lower side of the abdomen or in the groin. It usually starts deep in the groin and moves up to the outside of the hip area. Pain can occur with: °· A sudden change in position. °· Rolling over in bed. °· Coughing or sneezing. °· Physical activity. ° °Follow these instructions at home: °Watch your condition for any changes. Take these steps to help with your pain: °· When the pain starts, relax. Then try: °? Sitting down. °? Flexing your knees up to your abdomen. °? Lying on your side with one pillow under your abdomen and another pillow between your legs. °? Sitting in a warm bath for 15-20 minutes or until the pain goes away. °· Take over-the-counter and prescription medicines only as told by your health care provider. °· Move slowly when you sit and stand. °· Avoid long walks if they cause pain. °· Stop or lessen your physical activities if they cause pain. ° °Contact a health care provider if: °· Your pain does not go away with treatment. °· You feel pain in your back that you did not have before. °· Your medicine is not helping. °Get help right away if: °· You develop a fever or chills. °· You develop uterine contractions. °· You develop vaginal bleeding. °· You develop nausea or vomiting. °· You develop diarrhea. °· You have pain when you urinate. °This information is not intended to replace advice given to you by your health care provider. Make sure you discuss any questions you have  with your health care provider. °Document Released: 07/11/2008 Document Revised: 03/09/2016 Document Reviewed: 12/09/2014 °Elsevier Interactive Patient Education © 2018 [employer] °Fetal Movement Counts °Patient Name: ________________________________________________ Patient Due Date: ____________________ °What is a fetal movement count? °A fetal movement count is the number of times that you feel your baby move during a certain amount of time. This may also be called a fetal kick count. A fetal movement count is recommended for every pregnant woman. You may be asked to start counting fetal movements as early as week 28 of your pregnancy. °Pay attention to when your baby is most active. You may notice your baby's sleep and wake cycles. You may also notice things that make your baby move more. You should do a fetal movement count: °· When your baby is normally most active. °· At the same time each day. ° °A good time to count movements is while you are resting, after having something to eat and drink. °How do I count fetal movements? °1. Find a quiet, comfortable area. Sit, or lie down on your side. °2. Write down the date, the start time and stop time, and the number of movements that you felt between those two times. Take this information with you to your health care visits. °3. For 2 hours, count kicks, flutters, swishes, rolls, and jabs. You should feel at least 10 movements during 2 hours. °4.   You may stop counting after you have felt 10 movements. °5. If you do not feel 10 movements in 2 hours, have something to eat and drink. Then, keep resting and counting for 1 hour. If you feel at least 4 movements during that hour, you may stop counting. °Contact a health care provider if: °· You feel fewer than 4 movements in 2 hours. °· Your baby is not moving like he or she usually does. °Date: ____________ Start time: ____________ Stop time: ____________ Movements: ____________ °Date: ____________ Start time:  ____________ Stop time: ____________ Movements: ____________ °Date: ____________ Start time: ____________ Stop time: ____________ Movements: ____________ °Date: ____________ Start time: ____________ Stop time: ____________ Movements: ____________ °Date: ____________ Start time: ____________ Stop time: ____________ Movements: ____________ °Date: ____________ Start time: ____________ Stop time: ____________ Movements: ____________ °Date: ____________ Start time: ____________ Stop time: ____________ Movements: ____________ °Date: ____________ Start time: ____________ Stop time: ____________ Movements: ____________ °Date: ____________ Start time: ____________ Stop time: ____________ Movements: ____________ °This information is not intended to replace advice given to you by your health care provider. Make sure you discuss any questions you have with your health care provider. °Document Released: 11/01/2006 Document Revised: 05/31/2016 Document Reviewed: 11/11/2015 °Elsevier Interactive Patient Education © 2018 [employer] °Braxton Hicks Contractions °Contractions of the uterus can occur throughout pregnancy, but they are not always a sign that you are in labor. You may have practice contractions called Braxton Hicks contractions. These false labor contractions are sometimes confused with true labor. °What are Braxton Hicks contractions? °Braxton Hicks contractions are tightening movements that occur in the muscles of the uterus before labor. Unlike true labor contractions, these contractions do not result in opening (dilation) and thinning of the cervix. Toward the end of pregnancy (32-34 weeks), Braxton Hicks contractions can happen more often and may become stronger. These contractions are sometimes difficult to tell apart from true labor because they can be very uncomfortable. You should not feel embarrassed if you go to the hospital with false labor. °Sometimes, the only way to tell if you are in true labor is for  your health care provider to look for changes in the cervix. The health care provider will do a physical exam and may monitor your contractions. If you are not in true labor, the exam should show that your cervix is not dilating and your water has not broken. °If there are no prenatal problems or other health problems associated with your pregnancy, it is completely safe for you to be sent home with false labor. You may continue to have Braxton Hicks contractions until you go into true labor. °How can I tell the difference between true labor and false labor? °· Differences °? False labor °? Contractions last 30-70 seconds.: Contractions are usually shorter and not as strong as true labor contractions. °? Contractions become very regular.: Contractions are usually irregular. °? Discomfort is usually felt in the top of the uterus, and it spreads to the lower abdomen and low back.: Contractions are often felt in the front of the lower abdomen and in the groin. °? Contractions do not go away with walking.: Contractions may go away when you walk around or change positions while lying down. °? Contractions usually become more intense and increase in frequency.: Contractions get weaker and are shorter-lasting as time goes on. °? The cervix dilates and gets thinner.: The cervix usually does not dilate or become thin. °Follow these instructions at home: °· Take over-the-counter and prescription medicines only as told by your   health care provider. °· Keep up with your usual exercises and follow other instructions from your health care provider. °· Eat and drink lightly if you think you are going into labor. °· If Braxton Hicks contractions are making you uncomfortable: °? Change your position from lying down or resting to walking, or change from walking to resting. °? Sit and rest in a tub of warm water. °? Drink enough fluid to keep your urine clear or pale yellow. Dehydration may cause these contractions. °? Do slow and  deep breathing several times an hour. °· Keep all follow-up prenatal visits as told by your health care provider. This is important. °Contact a health care provider if: °· You have a fever. °· You have continuous pain in your abdomen. °Get help right away if: °· Your contractions become stronger, more regular, and closer together. °· You have fluid leaking or gushing from your vagina. °· You pass blood-tinged mucus (bloody show). °· You have bleeding from your vagina. °· You have low back pain that you never had before. °· You feel your baby’s head pushing down and causing pelvic pressure. °· Your baby is not moving inside you as much as it used to. °Summary °· Contractions that occur before labor are called Braxton Hicks contractions, false labor, or practice contractions. °· Braxton Hicks contractions are usually shorter, weaker, farther apart, and less regular than true labor contractions. True labor contractions usually become progressively stronger and regular and they become more frequent. °· Manage discomfort from Braxton Hicks contractions by changing position, resting in a warm bath, drinking plenty of water, or practicing deep breathing. °This information is not intended to replace advice given to you by your health care provider. Make sure you discuss any questions you have with your health care provider. °Document Released: 10/02/2005 Document Revised: 08/21/2016 Document Reviewed: 08/21/2016 °Elsevier Interactive Patient Education © 2017 Elsevier Inc. ° °

## 2017-09-14 ENCOUNTER — Inpatient Hospital Stay (HOSPITAL_COMMUNITY)
Admission: AD | Admit: 2017-09-14 | Discharge: 2017-09-14 | Disposition: A | Discharge status: Home / Self Care | Payer: Medicaid Other | Source: Ambulatory Visit | Attending: Obstetrics and Gynecology | Admitting: Obstetrics and Gynecology

## 2017-09-14 ENCOUNTER — Other Ambulatory Visit: Payer: Self-pay

## 2017-09-14 ENCOUNTER — Encounter (HOSPITAL_COMMUNITY): Payer: Self-pay | Admitting: *Deleted

## 2017-09-14 DIAGNOSIS — M549 Dorsalgia, unspecified: Secondary | ICD-10-CM

## 2017-09-14 DIAGNOSIS — R102 Pelvic and perineal pain: Secondary | ICD-10-CM | POA: Diagnosis not present

## 2017-09-14 DIAGNOSIS — F329 Major depressive disorder, single episode, unspecified: Secondary | ICD-10-CM | POA: Insufficient documentation

## 2017-09-14 DIAGNOSIS — O9989 Other specified diseases and conditions complicating pregnancy, childbirth and the puerperium: Secondary | ICD-10-CM

## 2017-09-14 DIAGNOSIS — Z3A32 32 weeks gestation of pregnancy: Secondary | ICD-10-CM | POA: Diagnosis not present

## 2017-09-14 DIAGNOSIS — O99343 Other mental disorders complicating pregnancy, third trimester: Secondary | ICD-10-CM | POA: Diagnosis not present

## 2017-09-14 DIAGNOSIS — O26893 Other specified pregnancy related conditions, third trimester: Secondary | ICD-10-CM | POA: Insufficient documentation

## 2017-09-14 DIAGNOSIS — Z880 Allergy status to penicillin: Secondary | ICD-10-CM | POA: Insufficient documentation

## 2017-09-14 DIAGNOSIS — R8271 Bacteriuria: Secondary | ICD-10-CM

## 2017-09-14 DIAGNOSIS — O99891 Other specified diseases and conditions complicating pregnancy: Secondary | ICD-10-CM

## 2017-09-14 DIAGNOSIS — F419 Anxiety disorder, unspecified: Secondary | ICD-10-CM | POA: Diagnosis not present

## 2017-09-14 DIAGNOSIS — N949 Unspecified condition associated with female genital organs and menstrual cycle: Secondary | ICD-10-CM

## 2017-09-14 DIAGNOSIS — R1030 Lower abdominal pain, unspecified: Secondary | ICD-10-CM | POA: Diagnosis not present

## 2017-09-14 DIAGNOSIS — Z79899 Other long term (current) drug therapy: Secondary | ICD-10-CM | POA: Diagnosis not present

## 2017-09-14 MED ORDER — CYCLOBENZAPRINE HCL 10 MG PO TABS
10.0000 mg | ORAL_TABLET | Freq: Once | ORAL | Status: AC
  Administered 2017-09-14: 10 mg via ORAL
  Filled 2017-09-14: qty 1

## 2017-09-14 MED ORDER — CYCLOBENZAPRINE HCL 10 MG PO TABS: 10.0000 mg | ORAL_TABLET | Freq: Three times a day (TID) | ORAL | 2 refills | Status: DC | PRN

## 2017-09-14 NOTE — Discharge Instructions (Signed)
Drink at least 8 8-oz glasses of water every day. Take Tylenol 325 mg 2 tablets by mouth every 4 hours if needed for pain.   Pick up your prescription at your pharmacy. You were to pick up and take the Keflex for Group B Strep that was found in your urine culture. Keep your appointment in the clinic on Tuesday.

## 2017-09-14 NOTE — MAU Note (Signed)
Pt presents with c/o lower abdominal & back pain.  Pt states back pain radiates into her bilateral sides and up her back.  Reports abdominal pain as intermittent&  Dull.  Denies VB or LOF.  Reports +FM.

## 2017-09-14 NOTE — MAU Provider Note (Signed)
History     CSN: [id number]  Arrival date and time: 09/14/17 1506   First Provider Initiated Contact with Patient 09/14/17 1612      Chief Complaint  Patient presents with  . Abdominal Pain  . Back Pain  . Pressure   HPI Margaret Cain 31 y.o. 543w1d  Comes to [facility]U today primarily with back pain that she has had for 3 days.  States she is not having contractions except for [employer] CorporationBraxton Hicks.  Does not think she has a UTI.  Did not every get her Keflex and take it for GBS.  Took Tylenol last night but it was not effective.  Has had flexeril in the past but is out of all of her refills for Flexeril.  Also has midline low abdominal pain and feels pressure.  OB History    Gravida Para Term Preterm AB Living   8 6 5 1 1 6    SAB TAB Ectopic Multiple Live Births   1 0 0   6      Past Medical History:  Diagnosis Date  . Anemia   . Anxiety   . Depression    ppd with first child  . Gestational diabetes    diet controlled  . Headache(784.0)   . Hx of chlamydia infection   . Positive GBS test 03/18/2017  . Pregnancy induced hypertension   . Preterm labor   . Trichomonosis   . Urinary tract infection   . 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 COLPOSCOPY      Family History  Problem Relation Age of Onset  . Asthma Son   . Asthma Paternal Aunt   . Anesthesia problems Neg Hx   . Hypotension Neg Hx   . Malignant hyperthermia Neg Hx   . Pseudochol deficiency Neg Hx   . Other Neg Hx   . Cancer Neg Hx   . Heart disease Neg Hx   . Stroke Neg Hx   . Hearing loss Neg Hx     Social History   Tobacco Use  . Smoking status: Never Smoker  . Smokeless tobacco: Never Used  Substance Use Topics  . Alcohol use: No  . Drug use: No    Allergies:  Allergies  Allergen Reactions  . Penicillins Nausea And Vomiting    Able to take IV PCN  . Penicillins Nausea And Vomiting    Has patient had a PCN reaction  causing immediate rash, facial/tongue/throat swelling, SOB or lightheadedness with hypotension: No Has patient had a PCN reaction causing severe rash involving mucus membranes or skin necrosis: No Has patient had a PCN reaction that required hospitalization: No Has patient had a PCN reaction occurring within the last 10 years: No If all of the above answers are "NO", then may proceed with Cephalosporin use.     Medications Prior to Admission  Medication Sig Dispense Refill Last Dose  . acetaminophen (TYLENOL) 500 MG tablet Take 500 mg by mouth every 6 (six) hours as needed for mild pain or headache.   Taking  . Butalbital-APAP-Caffeine (FIORICET PO) Take by mouth as needed.   Taking  . cephALEXin (KEFLEX) 250 MG/5ML suspension Take 500 mg by mouth 4 (four) times daily. FOR 8 DAYS   Taking  . cyclobenzaprine (FLEXERIL) 10 MG tablet Take 1 tablet (10 mg total) by mouth every 8 (eight) hours as needed (migraine). 30 tablet 2 Taking  .  Doxylamine-Pyridoxine (DICLEGIS) 10-10 MG TBEC Take 2 tabs at bedtime. May take 1 tab at breakfast if needed & may add 1 tab to lunch if needed 100 tablet 6 Taking  . Elastic Bandages & Supports (ABDOMINAL SUPPORT/L-XL) MISC 1 Units by Does not apply route as needed. 1 each 0 Taking  . Prenatal Vit-Fe Phos-FA-Omega (VITAFOL GUMMIES) 3.33-0.333-34.8 MG CHEW Chew 1 tablet by mouth daily.  38 Taking  . progesterone (PROMETRIUM) 200 MG capsule Place one capsule vaginally at bedtime 30 capsule 0 Taking  . progesterone 200 MG SUPP Place 200 mg vaginally at bedtime.   Taking    Review of Systems  Constitutional: Negative for fever.  Gastrointestinal: Positive for abdominal pain. Negative for constipation, diarrhea, nausea and vomiting.  Genitourinary: Negative for dysuria, vaginal bleeding and vaginal discharge.  Musculoskeletal: Positive for back pain.   Physical Exam   Blood pressure 107/65, pulse 97, temperature 98.2 F (36.8 C), temperature source Oral, resp.  rate 20, height 5' (1.524 m), weight 125 lb 8 oz (56.9 kg), last menstrual period 02/01/2017, SpO2 100 %, unknown if currently breastfeeding.  Physical Exam  Nursing note and vitals reviewed. Constitutional: She is oriented to person, place, and time. She appears well-developed and well-nourished.  HENT:  Head: Normocephalic.  Eyes: EOM are normal.  Neck: Neck supple.  GI: Soft. There is no tenderness. There is no rebound and no guarding.  Pain with palpation of pubic symphysis.  Able to replicate abdominal pain she was experiencing at home with this palpation. FHT baseline is 130 with moderate variability.  Rare contractions, no decelerations,  accels of 15x15 beats with movement - reactive strip  Genitourinary:  Genitourinary Comments: Cervix - internal os is closed, a good length of the cervix is palpated, vertex that is ballotable  Musculoskeletal: Normal range of motion.  Back pain is midline over the spine just below the waistline.  No CVA tenderness.  Neurological: She is alert and oriented to person, place, and time.  Skin: Skin is warm and dry.  Psychiatric: She has a normal mood and affect.    MAU Course  Procedures None done  MDM Likely her back pain is from bedrest and normal pregnancy, not preterm labor.  She states that flexeril will help but she is out.  Assessment and Plan  Back pain in pregnancy, third trimester - likely is musculoskeletal and she is out of her medication Pubic symphysis pain Hx of preterm birth but is currently using vaginal progesterone at bedtime, no contractions today and cervical internal os is closed.  Plan Has next PNV on Tuesday in the clinic downstairs. Keep taking the vaginal suppositories. Did not get the pregnancy belt as she said Medicaid would not cover it and cannot afford it from her personal expenses. Did not get the keflex as she did not know why she was supposed to take it - she is allergic to penicillin and keflex was likely  prescribed as the alternative to a amoxicillin or penicillin treat,emt for GBS.  Client said she would stop by her pharmacy and get the flexeril and keflex and take it. Return with regular contractions, leaking of fluid or vaginal bleeding.  Terri L Burleson 09/14/2017, 4:15 PM

## 2017-09-18 ENCOUNTER — Encounter: Payer: Self-pay | Admitting: Nurse Practitioner

## 2017-09-18 ENCOUNTER — Encounter: Payer: Self-pay | Admitting: Obstetrics & Gynecology

## 2017-09-18 ENCOUNTER — Ambulatory Visit (INDEPENDENT_AMBULATORY_CARE_PROVIDER_SITE_OTHER): Payer: Medicaid Other | Admitting: Nurse Practitioner

## 2017-09-18 VITALS — BP 105/71 | HR 100 | Wt 125.1 lb

## 2017-09-18 DIAGNOSIS — O09213 Supervision of pregnancy with history of pre-term labor, third trimester: Secondary | ICD-10-CM

## 2017-09-18 DIAGNOSIS — O0993 Supervision of high risk pregnancy, unspecified, third trimester: Secondary | ICD-10-CM

## 2017-09-18 DIAGNOSIS — O09212 Supervision of pregnancy with history of pre-term labor, second trimester: Secondary | ICD-10-CM

## 2017-09-18 NOTE — Patient Instructions (Addendum)

## 2017-09-18 NOTE — Progress Notes (Addendum)
    Subjective:  Margaret Cain is a 31 y.o. [id number] at 7250w5d being seen today for ongoing prenatal care.  She is currently monitored for the following issues for this high-risk pregnancy and has Smoker; Pica; History of preterm delivery; ASCUS with positive high risk HPV cervical; Anemia affecting pregnancy, antepartum; GBS bacteriuria; Supervision of high risk pregnancy, antepartum; History of gestational diabetes; Migraine headache; Round ligament pain; Back pain affecting pregnancy; and Unwanted fertility on their problem list.  Patient reports possible leaking of fluid.  Contractions: Irregular. Vag. Bleeding: None.  Movement: Present. Denies leaking of fluid.   The following portions of the patient's history were reviewed and updated as appropriate: allergies, current medications, past family history, past medical history, past social history, past surgical history and problem list. Problem list updated.  Objective:   Vitals:   09/18/17 1024  BP: 105/71  Pulse: 100  Weight: 125 lb 1.6 oz (56.7 kg)    Fetal Status: Fetal Heart Rate (bpm): 124 Fundal Height: 32 cm Movement: Present  Presentation: Vertex  General:  Alert, oriented and cooperative. Patient is in no acute distress.  Skin: Skin is warm and dry. No rash noted.   Cardiovascular: Normal heart rate noted  Respiratory: Normal respiratory effort, no problems with respiration noted  Abdomen: Soft, gravid, appropriate for gestational age. Pain/Pressure: Present     Pelvic:  Cervical exam performed Dilation: Closed Effacement (%): Thick Station: -2  Extremities: Normal range of motion.  Edema: None  Mental Status: Normal mood and affect. Normal behavior. Normal judgment and thought content.   Sterile speculum exam done - no leaking seen, Has some yellow discharge - likely remnants of daily vaginal suppositories which client reports using every day at bedtime.  Assessment and Plan:  Pregnancy: [id number] at 5750w5d  1.  Supervision of high risk pregnancy, antepartum, third trimester Stable at present.  Seen at [facility] since last clinic visit - note reviewed.  Is using the Flexeril sent to her pharmacy from [facility] for back pain and her back pain is much better.  2. Previous preterm delivery, antepartum, second trimester Using progesterone suppositories at night daily.  Preterm labor symptoms and general obstetric precautions including but not limited to vaginal bleeding, contractions, leaking of fluid and fetal movement were reviewed in detail with the patient. Please refer to After Visit Summary for other counseling recommendations.  Return in about 2 weeks (around 10/02/2017), or if symptoms worsen or fail to improve.  Nolene BernheimERRI Margaret Joo, RN, MSN, NP-BC Nurse Practitioner, [facility], [facility] 09/18/2017 12:11 PM

## 2017-09-29 ENCOUNTER — Inpatient Hospital Stay (HOSPITAL_COMMUNITY)
Admission: AD | Admit: 2017-09-29 | Discharge: 2017-09-29 | Disposition: A | Discharge status: Home / Self Care | Payer: Medicaid Other | Source: Ambulatory Visit | Attending: Obstetrics and Gynecology | Admitting: Obstetrics and Gynecology

## 2017-09-29 ENCOUNTER — Encounter (HOSPITAL_COMMUNITY): Payer: Self-pay

## 2017-09-29 DIAGNOSIS — Z3A34 34 weeks gestation of pregnancy: Secondary | ICD-10-CM | POA: Insufficient documentation

## 2017-09-29 DIAGNOSIS — R102 Pelvic and perineal pain: Secondary | ICD-10-CM | POA: Insufficient documentation

## 2017-09-29 DIAGNOSIS — N898 Other specified noninflammatory disorders of vagina: Secondary | ICD-10-CM

## 2017-09-29 DIAGNOSIS — O26893 Other specified pregnancy related conditions, third trimester: Secondary | ICD-10-CM | POA: Diagnosis not present

## 2017-09-29 DIAGNOSIS — R8271 Bacteriuria: Secondary | ICD-10-CM

## 2017-09-29 DIAGNOSIS — Z880 Allergy status to penicillin: Secondary | ICD-10-CM | POA: Insufficient documentation

## 2017-09-29 DIAGNOSIS — N949 Unspecified condition associated with female genital organs and menstrual cycle: Secondary | ICD-10-CM

## 2017-09-29 LAB — URINALYSIS, MICROSCOPIC (REFLEX)

## 2017-09-29 LAB — URINALYSIS, ROUTINE W REFLEX MICROSCOPIC
BILIRUBIN URINE: NEGATIVE
GLUCOSE, UA: NEGATIVE mg/dL
HGB URINE DIPSTICK: NEGATIVE
KETONES UR: 15 mg/dL — AB
Nitrite: NEGATIVE
PH: 6 (ref 5.0–8.0)
PROTEIN: NEGATIVE mg/dL
Specific Gravity, Urine: 1.025 (ref 1.005–1.030)

## 2017-09-29 LAB — WET PREP, GENITAL
CLUE CELLS WET PREP: NONE SEEN
SPERM: NONE SEEN
TRICH WET PREP: NONE SEEN
YEAST WET PREP: NONE SEEN

## 2017-09-29 LAB — POCT FERN TEST: POCT Fern Test: NEGATIVE

## 2017-09-29 LAB — OB RESULTS CONSOLE GC/CHLAMYDIA: Gonorrhea: NEGATIVE

## 2017-09-29 MED ORDER — BUTALBITAL-APAP-CAFFEINE 50-325-40 MG PO TABS
1.0000 | ORAL_TABLET | Freq: Once | ORAL | Status: AC
  Administered 2017-09-29: 1 via ORAL
  Filled 2017-09-29: qty 1

## 2017-09-29 NOTE — MAU Note (Signed)
Pt reports she has had a discharge with odor for the last 2-3 days. States she feels "damp" in her vaginal area all the time. Pt states she thinks she may by ruptured

## 2017-09-29 NOTE — MAU Provider Note (Signed)
History    [id number] @ 34.2 wks in with c/o vaginal discharge and odor x 2 days. denis vag bleeding. CSN: [id number]  Arrival date & time 09/29/17  1004   None     Chief Complaint  Patient presents with  . Rupture of Membranes  . Vaginal Discharge    HPI  Past Medical History:  Diagnosis Date  . Anemia   . Anxiety   . Depression    ppd with first child  . Gestational diabetes    diet controlled  . Headache(784.0)   . Hx of chlamydia infection   . Positive GBS test 03/18/2017  . Pregnancy induced hypertension   . Preterm labor   . Trichomonosis   . Urinary tract infection   . 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 COLPOSCOPY      Family History  Problem Relation Age of Onset  . Asthma Son   . Asthma Paternal Aunt   . Anesthesia problems Neg Hx   . Hypotension Neg Hx   . Malignant hyperthermia Neg Hx   . Pseudochol deficiency Neg Hx   . Other Neg Hx   . Cancer Neg Hx   . Heart disease Neg Hx   . Stroke Neg Hx   . Hearing loss Neg Hx     Social History   Tobacco Use  . Smoking status: Never Smoker  . Smokeless tobacco: Never Used  Substance Use Topics  . Alcohol use: No  . Drug use: No    OB History    Gravida Para Term Preterm AB Living   8 6 5 1 1 6    SAB TAB Ectopic Multiple Live Births   1 0 0   6      Review of Systems  Constitutional: Negative.   HENT: Negative.   Eyes: Negative.   Respiratory: Negative.   Cardiovascular: Negative.   Gastrointestinal: Negative.   Endocrine: Negative.   Genitourinary: Positive for vaginal discharge.       Vaginal odor  Musculoskeletal: Negative.   Skin: Negative.   Allergic/Immunologic: Negative.   Neurological: Negative.   Hematological: Negative.   Psychiatric/Behavioral: Negative.     Allergies  Penicillins and Penicillins  Home Medications    BP 109/66 (BP Location: Right Arm)   Pulse (!) 105   Temp 98.1 F  (36.7 C) (Oral)   Resp 15   Ht 5' 0.5" (1.537 m)   Wt 125 lb (56.7 kg)   LMP 02/01/2017 (Approximate)   SpO2 99%   BMI 24.01 kg/m   Physical Exam  Constitutional: She is oriented to person, place, and time. She appears well-developed and well-nourished.  HENT:  Head: Normocephalic.  Eyes: Pupils are equal, round, and reactive to light.  Neck: Normal range of motion.  Cardiovascular: Normal rate, regular rhythm, normal heart sounds and intact distal pulses.  Pulmonary/Chest: Effort normal and breath sounds normal.  Abdominal: Soft. Bowel sounds are normal.  Genitourinary: Vagina normal and uterus normal.  Musculoskeletal: Normal range of motion.  Neurological: She is alert and oriented to person, place, and time. She has normal reflexes.  Skin: Skin is warm and dry.  Psychiatric: She has a normal mood and affect. Her behavior is normal. Judgment and thought content normal.    MAU Course  Procedures (including critical care time)  Labs Reviewed  WET PREP, GENITAL  URINALYSIS, ROUTINE W REFLEX MICROSCOPIC  GC/CHLAMYDIA PROBE  AMP ([facility]   No results found.   1. Vaginal odor   2. Vaginal discharge in pregnancy, third trimester   3. Round ligament pain   4. GBS bacteriuria       MDM  FHR 130's with aceels 15x15. No contractions sterile spec exam done no loss of fluid will valsalva, fern done and neg. Wet prep WNL . Cultures obtained. ua normal. Will d/c home

## 2017-10-01 LAB — GC/CHLAMYDIA PROBE AMP (~~LOC~~) NOT AT ARMC
Chlamydia: NEGATIVE
Neisseria Gonorrhea: NEGATIVE

## 2017-10-04 ENCOUNTER — Encounter: Payer: Self-pay | Admitting: Advanced Practice Midwife

## 2017-10-04 ENCOUNTER — Ambulatory Visit (INDEPENDENT_AMBULATORY_CARE_PROVIDER_SITE_OTHER): Payer: Medicaid Other | Admitting: Advanced Practice Midwife

## 2017-10-04 ENCOUNTER — Other Ambulatory Visit (HOSPITAL_COMMUNITY)
Admission: RE | Admit: 2017-10-04 | Discharge: 2017-10-04 | Disposition: A | Discharge status: Home / Self Care | Payer: Medicaid Other | Source: Ambulatory Visit | Attending: Advanced Practice Midwife | Admitting: Advanced Practice Midwife

## 2017-10-04 VITALS — BP 115/70 | HR 103 | Wt 126.5 lb

## 2017-10-04 DIAGNOSIS — B373 Candidiasis of vulva and vagina: Secondary | ICD-10-CM | POA: Insufficient documentation

## 2017-10-04 DIAGNOSIS — N898 Other specified noninflammatory disorders of vagina: Secondary | ICD-10-CM | POA: Insufficient documentation

## 2017-10-04 DIAGNOSIS — R8271 Bacteriuria: Secondary | ICD-10-CM

## 2017-10-04 DIAGNOSIS — Z3A00 Weeks of gestation of pregnancy not specified: Secondary | ICD-10-CM | POA: Insufficient documentation

## 2017-10-04 DIAGNOSIS — B3731 Acute candidiasis of vulva and vagina: Secondary | ICD-10-CM

## 2017-10-04 DIAGNOSIS — O26899 Other specified pregnancy related conditions, unspecified trimester: Secondary | ICD-10-CM

## 2017-10-04 DIAGNOSIS — O98819 Other maternal infectious and parasitic diseases complicating pregnancy, unspecified trimester: Secondary | ICD-10-CM | POA: Diagnosis not present

## 2017-10-04 DIAGNOSIS — O26893 Other specified pregnancy related conditions, third trimester: Secondary | ICD-10-CM

## 2017-10-04 MED ORDER — VITAFOL GUMMIES 3.33-0.333-34.8 MG PO CHEW: 1.0000 | CHEWABLE_TABLET | Freq: Every day | ORAL | 6 refills | Status: DC

## 2017-10-04 MED ORDER — TERCONAZOLE 0.4 % VA CREA: 1.0000 | TOPICAL_CREAM | Freq: Every day | VAGINAL | 1 refills | Status: DC

## 2017-10-04 NOTE — Progress Notes (Signed)
   PRENATAL VISIT NOTE  Subjective:  Margaret Cain is a 31 y.o. [id number] at 3458w0d being seen today for ongoing prenatal care.  She is currently monitored for the following issues for this high-risk pregnancy and has Smoker; Pica; History of preterm delivery; ASCUS with positive high risk HPV cervical; Anemia affecting pregnancy, antepartum; GBS bacteriuria; Supervision of high risk pregnancy, antepartum; History of gestational diabetes; Migraine headache; Round ligament pain; Back pain affecting pregnancy; and Unwanted fertility on their problem list.  Patient reports occasional contractions and vaginal irritation.  Contractions: Irregular. Vag. Bleeding: None.  Movement: Present. Denies leaking of fluid.   The following portions of the patient's history were reviewed and updated as appropriate: allergies, current medications, past family history, past medical history, past social history, past surgical history and problem list. Problem list updated.  Objective:   Vitals:   10/04/17 1021  BP: 115/70  Pulse: (!) 103  Weight: 126 lb 8 oz (57.4 kg)    Fetal Status: Fetal Heart Rate (bpm): 128   Movement: Present     General:  Alert, oriented and cooperative. Patient is in no acute distress.  Skin: Skin is warm and dry. No rash noted.   Cardiovascular: Normal heart rate noted  Respiratory: Normal respiratory effort, no problems with respiration noted  Abdomen: Soft, gravid, appropriate for gestational age.  Pain/Pressure: Present     Pelvic: Cervical exam performed        Extremities: Normal range of motion.  Edema: None  Mental Status:  Normal mood and affect. Normal behavior. Normal judgment and thought content.   Assessment and Plan:  Pregnancy: [id number] at 558w0d  1. GBS bacteriuria  - Culture, OB Urine  2. Vaginal discharge during pregnancy, antepartum  - Cervicovaginal ancillary only  3. Vaginal yeast infection  - terconazole (TERAZOL 7) 0.4 % vaginal cream; Place 1  applicator vaginally at bedtime.  Dispense: 45 g; Refill: 1  Preterm labor symptoms and general obstetric precautions including but not limited to vaginal bleeding, contractions, leaking of fluid and fetal movement were reviewed in detail with the patient. Please refer to After Visit Summary for other counseling recommendations.  Return in about 1 week (around 10/11/2017) for ROB.   Dorathy KinsmanVirginia Sebert Stollings, CNM

## 2017-10-04 NOTE — Patient Instructions (Signed)
Braxton Hicks Contractions °Contractions of the uterus can occur throughout pregnancy, but they are not always a sign that you are in labor. You may have practice contractions called Braxton Hicks contractions. These false labor contractions are sometimes confused with true labor. °What are Braxton Hicks contractions? °Braxton Hicks contractions are tightening movements that occur in the muscles of the uterus before labor. Unlike true labor contractions, these contractions do not result in opening (dilation) and thinning of the cervix. Toward the end of pregnancy (32-34 weeks), Braxton Hicks contractions can happen more often and may become stronger. These contractions are sometimes difficult to tell apart from true labor because they can be very uncomfortable. You should not feel embarrassed if you go to the hospital with false labor. °Sometimes, the only way to tell if you are in true labor is for your health care provider to look for changes in the cervix. The health care provider will do a physical exam and may monitor your contractions. If you are not in true labor, the exam should show that your cervix is not dilating and your water has not broken. °If there are other health problems associated with your pregnancy, it is completely safe for you to be sent home with false labor. You may continue to have Braxton Hicks contractions until you go into true labor. °How to tell the difference between true labor and false labor °True labor °· Contractions last 30-70 seconds. °· Contractions become very regular. °· Discomfort is usually felt in the top of the uterus, and it spreads to the lower abdomen and low back. °· Contractions do not go away with walking. °· Contractions usually become more intense and increase in frequency. °· The cervix dilates and gets thinner. °False labor °· Contractions are usually shorter and not as strong as true labor contractions. °· Contractions are usually irregular. °· Contractions  are often felt in the front of the lower abdomen and in the groin. °· Contractions may go away when you walk around or change positions while lying down. °· Contractions get weaker and are shorter-lasting as time goes on. °· The cervix usually does not dilate or become thin. °Follow these instructions at home: °· Take over-the-counter and prescription medicines only as told by your health care provider. °· Keep up with your usual exercises and follow other instructions from your health care provider. °· Eat and drink lightly if you think you are going into labor. °· If Braxton Hicks contractions are making you uncomfortable: °? Change your position from lying down or resting to walking, or change from walking to resting. °? Sit and rest in a tub of warm water. °? Drink enough fluid to keep your urine pale yellow. Dehydration may cause these contractions. °? Do slow and deep breathing several times an hour. °· Keep all follow-up prenatal visits as told by your health care provider. This is important. °Contact a health care provider if: °· You have a fever. °· You have continuous pain in your abdomen. °Get help right away if: °· Your contractions become stronger, more regular, and closer together. °· You have fluid leaking or gushing from your vagina. °· You pass blood-tinged mucus (bloody show). °· You have bleeding from your vagina. °· You have low back pain that you never had before. °· You feel your baby’s head pushing down and causing pelvic pressure. °· Your baby is not moving inside you as much as it used to. °Summary °· Contractions that occur before labor are called Braxton   Hicks contractions, false labor, or practice contractions. °· Braxton Hicks contractions are usually shorter, weaker, farther apart, and less regular than true labor contractions. True labor contractions usually become progressively stronger and regular and they become more frequent. °· Manage discomfort from Braxton Hicks contractions by  changing position, resting in a warm bath, drinking plenty of water, or practicing deep breathing. °This information is not intended to replace advice given to you by your health care provider. Make sure you discuss any questions you have with your health care provider. °Document Released: 02/15/2017 Document Revised: 02/15/2017 Document Reviewed: 02/15/2017 °Elsevier Interactive Patient Education © 2018 [employer] ° °

## 2017-10-05 LAB — CERVICOVAGINAL ANCILLARY ONLY
BACTERIAL VAGINITIS: NEGATIVE
Candida vaginitis: POSITIVE — AB
Chlamydia: NEGATIVE
NEISSERIA GONORRHEA: NEGATIVE
TRICH (WINDOWPATH): NEGATIVE

## 2017-10-06 LAB — CULTURE, OB URINE

## 2017-10-06 LAB — URINE CULTURE, OB REFLEX

## 2017-10-08 ENCOUNTER — Inpatient Hospital Stay (HOSPITAL_COMMUNITY)
Admission: AD | Admit: 2017-10-08 | Discharge: 2017-10-08 | Disposition: A | Discharge status: Home / Self Care | Payer: Medicaid Other | Source: Ambulatory Visit | Attending: Obstetrics & Gynecology | Admitting: Obstetrics & Gynecology

## 2017-10-08 ENCOUNTER — Other Ambulatory Visit: Payer: Self-pay

## 2017-10-08 DIAGNOSIS — F329 Major depressive disorder, single episode, unspecified: Secondary | ICD-10-CM | POA: Insufficient documentation

## 2017-10-08 DIAGNOSIS — R8271 Bacteriuria: Secondary | ICD-10-CM | POA: Insufficient documentation

## 2017-10-08 DIAGNOSIS — O26893 Other specified pregnancy related conditions, third trimester: Secondary | ICD-10-CM | POA: Diagnosis not present

## 2017-10-08 DIAGNOSIS — O99343 Other mental disorders complicating pregnancy, third trimester: Secondary | ICD-10-CM | POA: Insufficient documentation

## 2017-10-08 DIAGNOSIS — O2343 Unspecified infection of urinary tract in pregnancy, third trimester: Secondary | ICD-10-CM | POA: Diagnosis not present

## 2017-10-08 DIAGNOSIS — O4703 False labor before 37 completed weeks of gestation, third trimester: Secondary | ICD-10-CM

## 2017-10-08 DIAGNOSIS — Z3A35 35 weeks gestation of pregnancy: Secondary | ICD-10-CM | POA: Insufficient documentation

## 2017-10-08 DIAGNOSIS — R102 Pelvic and perineal pain: Secondary | ICD-10-CM | POA: Insufficient documentation

## 2017-10-08 DIAGNOSIS — N949 Unspecified condition associated with female genital organs and menstrual cycle: Secondary | ICD-10-CM

## 2017-10-08 DIAGNOSIS — F419 Anxiety disorder, unspecified: Secondary | ICD-10-CM | POA: Diagnosis not present

## 2017-10-08 LAB — URINALYSIS, ROUTINE W REFLEX MICROSCOPIC
Bilirubin Urine: NEGATIVE
GLUCOSE, UA: NEGATIVE mg/dL
Ketones, ur: NEGATIVE mg/dL
NITRITE: NEGATIVE
PH: 6 (ref 5.0–8.0)
PROTEIN: NEGATIVE mg/dL
SPECIFIC GRAVITY, URINE: 1.015 (ref 1.005–1.030)

## 2017-10-08 MED ORDER — FENTANYL CITRATE (PF) 100 MCG/2ML IJ SOLN: 50.0000 ug | Freq: Once | INTRAMUSCULAR | Status: DC

## 2017-10-08 MED ORDER — DEXTROSE IN LACTATED RINGERS 5 % IV SOLN
Freq: Once | INTRAVENOUS | Status: AC
  Administered 2017-10-08: 999 mL via INTRAVENOUS

## 2017-10-08 NOTE — MAU Provider Note (Signed)
Chief Complaint:  Contractions   First Provider Initiated Contact with Patient 10/08/17 [phone]     HPI: Margaret Cain is a 31 y.o. [id number] at 7235w4dwho presents to maternity admissions reporting uterine contractions off and on all morning. She reports good fetal movement, denies LOF, vaginal bleeding, vaginal itching/burning, urinary symptoms, h/a, dizziness, n/v, diarrhea, constipation or fever/chills.    Abdominal Pain  This is a new problem. The current episode started today. The onset quality is gradual. The problem occurs intermittently. The problem has been unchanged. The pain is located in the suprapubic region, LLQ and RLQ. The quality of the pain is cramping. The abdominal pain does not radiate. Pertinent negatives include no constipation, diarrhea, fever, myalgias, nausea or vomiting. Nothing aggravates the pain. The pain is relieved by nothing. She has tried nothing for the symptoms.    RN Note: Pt reports she has had contractions on and off all morning. Denies any vag bleeding or leaking. Good fetal movement reported.     Past Medical History: Past Medical History:  Diagnosis Date  . Anemia   . Anxiety   . Depression    ppd with first child  . Gestational diabetes    diet controlled  . Headache(784.0)   . Hx of chlamydia infection   . Positive GBS test 03/18/2017  . Pregnancy induced hypertension   . Preterm labor   . Trichomonosis   . Urinary tract infection   . Vaginal Pap smear, abnormal    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    Past obstetric history: OB History  Gravida Para Term Preterm AB Living  8 6 5 1 1 6   SAB TAB Ectopic Multiple Live Births  1 0 0   6    # Outcome Date GA Lbr Len/2nd Weight Sex Delivery Anes PTL Lv  8 Current           7 Term 03/19/16 2662w1d 01:45 / 00:07 7 lb 5.1 oz (3.32 kg) M Vag-Spont EPI  LIV     Birth Comments: none  6 Term 09/08/14 330w4d 03:40 / 00:07 6 lb 15.3 oz (3.155 kg) F Vag-Spont None  LIV  5 Term 09/20/13 6861w5d 04:10 / 00:31  6 lb 16 oz (3.175 kg) F Vag-Spont EPI  LIV  4 Preterm 07/17/12 4235w3d 19:05 / 00:12 4 lb 5.7 oz (1.975 kg) M Vag-Spont EPI  LIV  3 SAB 10/05/11          2 Term 10/15/10 4928w0d  7 lb 2 oz (3.232 kg) M Vag-Spont   LIV  1 Term 2005 5613w0d  5 lb 7 oz (2.466 kg) M Vag-Spont   LIV      Past Surgical History: Past Surgical History:  Procedure Laterality Date  . COLPOSCOPY      Family History: Family History  Problem Relation Age of Onset  . Asthma Son   . Asthma Paternal Aunt   . Anesthesia problems Neg Hx   . Hypotension Neg Hx   . Malignant hyperthermia Neg Hx   . Pseudochol deficiency Neg Hx   . Other Neg Hx   . Cancer Neg Hx   . Heart disease Neg Hx   . Stroke Neg Hx   . Hearing loss Neg Hx     Social History: Social History   Tobacco Use  . Smoking status: Never Smoker  . Smokeless tobacco: Never Used  Substance Use Topics  . Alcohol use: No  . Drug use: No    Allergies:  Allergies  Allergen Reactions  . Penicillins Nausea And Vomiting    Able to take IV PCN  . Penicillins Nausea And Vomiting    Has patient had a PCN reaction causing immediate rash, facial/tongue/throat swelling, SOB or lightheadedness with hypotension: No Has patient had a PCN reaction causing severe rash involving mucus membranes or skin necrosis: No Has patient had a PCN reaction that required hospitalization: No Has patient had a PCN reaction occurring within the last 10 years: No If all of the above answers are "NO", then may proceed with Cephalosporin use.     Meds:  Medications Prior to Admission  Medication Sig Dispense Refill Last Dose  . acetaminophen (TYLENOL) 500 MG tablet Take 500 mg by mouth every 6 (six) hours as needed for mild pain or headache.   Taking  . Butalbital-APAP-Caffeine (FIORICET PO) Take 1 tablet by mouth as needed (migraine).    Taking  . cyclobenzaprine (FLEXERIL) 10 MG tablet Take 1 tablet (10 mg total) by mouth 3 (three) times daily as needed for up to 90 doses  for muscle spasms. 90 tablet 2 Taking  . Elastic Bandages & Supports (ABDOMINAL SUPPORT/L-XL) MISC 1 Units by Does not apply route as needed. (Patient not taking: Reported on 09/18/2017) 1 each 0 Not Taking  . Prenatal Vit-Fe Phos-FA-Omega (VITAFOL GUMMIES) 3.33-0.333-34.8 MG CHEW Chew 1 tablet by mouth daily. 90 tablet 6   . terconazole (TERAZOL 7) 0.4 % vaginal cream Place 1 applicator vaginally at bedtime. 45 g 1     I have reviewed patient's Past Medical Hx, Surgical Hx, Family Hx, Social Hx, medications and allergies.   ROS:  Review of Systems  Constitutional: Negative for fever.  Gastrointestinal: Positive for abdominal pain. Negative for constipation, diarrhea, nausea and vomiting.  Musculoskeletal: Negative for myalgias.   Other systems negative  Physical Exam   Patient Vitals for the past 24 hrs:  BP Temp Temp src Pulse Resp SpO2 Height Weight  10/08/17 0914 (!) 110/58 98.1 F (36.7 C) - 96 20 100 % - -  10/08/17 0857 (!) 103/56 97.6 F (36.4 C) Oral 96 18 - 5' 0.5" (1.537 m) 127 lb (57.6 kg)   Constitutional: Well-developed, well-nourished female in no acute distress. Appears to be uncomfortable with contractions Cardiovascular: normal rate and rhythm Respiratory: normal effort, clear to auscultation bilaterally GI: Abd soft, non-tender, gravid appropriate for gestational age.   No rebound or guarding. MS: Extremities nontender, no edema, normal ROM Neurologic: Alert and oriented x 4.  GU: Neg CVAT.  PELVIC EXAM:   Dilation: 2 Effacement (%): Thick Station: -3 Exam by:: Mayford KnifeWilliams, CNM    FHT:  Baseline 130 , moderate variability, accelerations present, no decelerations Contractions: q 4-6 mins Irregular    Labs: --/--/O POS (10/10 1501) Results for orders placed or performed during the hospital encounter of 10/08/17 (from the past 24 hour(s))  Urinalysis, Routine w reflex microscopic     Status: Abnormal   Collection Time: 10/08/17  8:59 AM  Result Value Ref  Range   Color, Urine YELLOW YELLOW   APPearance CLEAR CLEAR   Specific Gravity, Urine 1.015 1.005 - 1.030   pH 6.0 5.0 - 8.0   Glucose, UA NEGATIVE NEGATIVE mg/dL   Hgb urine dipstick SMALL (A) NEGATIVE   Bilirubin Urine NEGATIVE NEGATIVE   Ketones, ur NEGATIVE NEGATIVE mg/dL   Protein, ur NEGATIVE NEGATIVE mg/dL   Nitrite NEGATIVE NEGATIVE   Leukocytes, UA TRACE (A) NEGATIVE   RBC / HPF 0-5 0 - 5 RBC/hpf  WBC, UA 0-5 0 - 5 WBC/hpf   Bacteria, UA RARE (A) NONE SEEN   Squamous Epithelial / LPF 0-5 (A) NONE SEEN   Mucus PRESENT    Sperm, UA PRESENT     Imaging:  No results found.  MAU Course/MDM: I have ordered labs and reviewed results.  NST reviewed and found reactive Since she is preterm ,  I ordered IV hydration.  This was very effective in slowing contractions. Offered Fentanyl for pain but she declined.   Cervix rechecked after 2 hours and was unchanged.  Patient was very angry and stated she was in labor and wanted to deliver.  Treatments in MAU included IV fluids and EFM.    Assessment: 1. Round ligament pain   2. GBS bacteriuria   3.  Single IUP at [redacted]w[redacted]d 4.   Preterm contractions but not in labor  Plan: Discharge home Preterm Labor precautions and fetal kick counts Follow up in Office for prenatal visits and recheck of cervix  Encouraged to return here or to other Urgent Care/ED if she develops worsening of symptoms, increase in pain, fever, or other concerning symptoms.   Pt stable at time of discharge.  Wynelle Bourgeois CNM, MSN Certified Nurse-Midwife 10/08/2017 9:32 AM

## 2017-10-08 NOTE — MAU Note (Signed)
Pt reports she has had contractions on and off all morning. Denies any vag bleeding or leaking. Good fetal movement reported.

## 2017-10-08 NOTE — Discharge Instructions (Signed)

## 2017-10-11 ENCOUNTER — Encounter: Payer: Self-pay | Admitting: Advanced Practice Midwife

## 2017-10-15 ENCOUNTER — Encounter (HOSPITAL_COMMUNITY): Payer: Self-pay | Admitting: *Deleted

## 2017-10-15 ENCOUNTER — Inpatient Hospital Stay (HOSPITAL_COMMUNITY)
Admission: AD | Admit: 2017-10-15 | Discharge: 2017-10-15 | Disposition: A | Discharge status: Home / Self Care | Payer: Medicaid Other | Source: Ambulatory Visit | Attending: Obstetrics & Gynecology | Admitting: Obstetrics & Gynecology

## 2017-10-15 ENCOUNTER — Encounter: Payer: Self-pay | Admitting: Advanced Practice Midwife

## 2017-10-15 DIAGNOSIS — O479 False labor, unspecified: Secondary | ICD-10-CM

## 2017-10-15 DIAGNOSIS — O26893 Other specified pregnancy related conditions, third trimester: Secondary | ICD-10-CM | POA: Insufficient documentation

## 2017-10-15 DIAGNOSIS — R609 Edema, unspecified: Secondary | ICD-10-CM | POA: Diagnosis not present

## 2017-10-15 DIAGNOSIS — Z3A37 37 weeks gestation of pregnancy: Secondary | ICD-10-CM | POA: Insufficient documentation

## 2017-10-15 LAB — URINALYSIS, ROUTINE W REFLEX MICROSCOPIC
Bilirubin Urine: NEGATIVE
GLUCOSE, UA: NEGATIVE mg/dL
HGB URINE DIPSTICK: NEGATIVE
Ketones, ur: NEGATIVE mg/dL
LEUKOCYTES UA: NEGATIVE
Nitrite: NEGATIVE
PH: 6 (ref 5.0–8.0)
PROTEIN: NEGATIVE mg/dL
SPECIFIC GRAVITY, URINE: 1.018 (ref 1.005–1.030)

## 2017-10-15 NOTE — Discharge Instructions (Signed)
Braxton Hicks Contractions °Contractions of the uterus can occur throughout pregnancy, but they are not always a sign that you are in labor. You may have practice contractions called Braxton Hicks contractions. These false labor contractions are sometimes confused with true labor. °What are Braxton Hicks contractions? °Braxton Hicks contractions are tightening movements that occur in the muscles of the uterus before labor. Unlike true labor contractions, these contractions do not result in opening (dilation) and thinning of the cervix. Toward the end of pregnancy (32-34 weeks), Braxton Hicks contractions can happen more often and may become stronger. These contractions are sometimes difficult to tell apart from true labor because they can be very uncomfortable. You should not feel embarrassed if you go to the hospital with false labor. °Sometimes, the only way to tell if you are in true labor is for your health care provider to look for changes in the cervix. The health care provider will do a physical exam and may monitor your contractions. If you are not in true labor, the exam should show that your cervix is not dilating and your water has not broken. °If there are other health problems associated with your pregnancy, it is completely safe for you to be sent home with false labor. You may continue to have Braxton Hicks contractions until you go into true labor. °How to tell the difference between true labor and false labor °True labor °· Contractions last 30-70 seconds. °· Contractions become very regular. °· Discomfort is usually felt in the top of the uterus, and it spreads to the lower abdomen and low back. °· Contractions do not go away with walking. °· Contractions usually become more intense and increase in frequency. °· The cervix dilates and gets thinner. °False labor °· Contractions are usually shorter and not as strong as true labor contractions. °· Contractions are usually irregular. °· Contractions  are often felt in the front of the lower abdomen and in the groin. °· Contractions may go away when you walk around or change positions while lying down. °· Contractions get weaker and are shorter-lasting as time goes on. °· The cervix usually does not dilate or become thin. °Follow these instructions at home: °· Take over-the-counter and prescription medicines only as told by your health care provider. °· Keep up with your usual exercises and follow other instructions from your health care provider. °· Eat and drink lightly if you think you are going into labor. °· If Braxton Hicks contractions are making you uncomfortable: °? Change your position from lying down or resting to walking, or change from walking to resting. °? Sit and rest in a tub of warm water. °? Drink enough fluid to keep your urine pale yellow. Dehydration may cause these contractions. °? Do slow and deep breathing several times an hour. °· Keep all follow-up prenatal visits as told by your health care provider. This is important. °Contact a health care provider if: °· You have a fever. °· You have continuous pain in your abdomen. °Get help right away if: °· Your contractions become stronger, more regular, and closer together. °· You have fluid leaking or gushing from your vagina. °· You pass blood-tinged mucus (bloody show). °· You have bleeding from your vagina. °· You have low back pain that you never had before. °· You feel your baby’s head pushing down and causing pelvic pressure. °· Your baby is not moving inside you as much as it used to. °Summary °· Contractions that occur before labor are called Braxton   Hicks contractions, false labor, or practice contractions. °· Braxton Hicks contractions are usually shorter, weaker, farther apart, and less regular than true labor contractions. True labor contractions usually become progressively stronger and regular and they become more frequent. °· Manage discomfort from Braxton Hicks contractions by  changing position, resting in a warm bath, drinking plenty of water, or practicing deep breathing. °This information is not intended to replace advice given to you by your health care provider. Make sure you discuss any questions you have with your health care provider. °Document Released: 02/15/2017 Document Revised: 02/15/2017 Document Reviewed: 02/15/2017 °Elsevier Interactive Patient Education © 2018 [employer] ° °

## 2017-10-15 NOTE — Progress Notes (Signed)
MSE performed. Endorses some foot/hand swelling towards the end of pregnancy. Denies headache, visual disturbance, or epigastric pain. Patient is normotensive. Minimal swelling of BLE & BUE on exam. Will continue as RN labor evaluation.   Judeth HornLawrence, Ricky Gallery, NP

## 2017-10-15 NOTE — MAU Note (Signed)
Pt reports increased pelvic pressure and contractions. C/o swelling oin her hands on and off.

## 2017-10-16 NOTE — L&D Delivery Note (Signed)
Patient is 32 y.o. [id number] 2565w6d admitted for SROM. Prenatal course also complicated by h/o preterm labor received 17P, ASCUS with positive high risk HPV - will need pp pap, anemia, GBS bacteriuria, h/o gestational diabetes, migraine headache, does not have custody of other children  Delivery Note At 7:01 AM a viable female was delivered via Vaginal, Spontaneous (Presentation: ROA ).  APGAR: 8, 10; weight pending .   Placenta status: spontaneous, intact.  Cord:  3 vessel  Anesthesia:  epidural Episiotomy: None Lacerations: None Est. Blood Loss (mL): 150 cc   Head delivered ROA. No nuchal cord present. Shoulder and body delivered in usual fashion. Infant with spontaneous cry, placed on mother's abdomen, dried and bulb suctioned. Cord clamped x 2 after 1-minute delay, and cut by family member. Cord blood drawn. Placenta delivered spontaneously with gentle cord traction. Fundus firm with massage and Pitocin. Perineum inspected and found to have no laceration.  Mom to postpartum.  Baby to Couplet care / Skin to Skin.  Margaret Cain 11/07/2017, 7:16 AM

## 2017-10-20 ENCOUNTER — Encounter (HOSPITAL_COMMUNITY): Payer: Self-pay

## 2017-10-20 ENCOUNTER — Inpatient Hospital Stay (HOSPITAL_COMMUNITY)
Admission: AD | Admit: 2017-10-20 | Discharge: 2017-10-20 | Disposition: A | Discharge status: Home / Self Care | Payer: Medicaid Other | Source: Ambulatory Visit | Attending: Obstetrics and Gynecology | Admitting: Obstetrics and Gynecology

## 2017-10-20 DIAGNOSIS — Z3A37 37 weeks gestation of pregnancy: Secondary | ICD-10-CM | POA: Insufficient documentation

## 2017-10-20 DIAGNOSIS — O26899 Other specified pregnancy related conditions, unspecified trimester: Secondary | ICD-10-CM

## 2017-10-20 DIAGNOSIS — O26893 Other specified pregnancy related conditions, third trimester: Secondary | ICD-10-CM | POA: Insufficient documentation

## 2017-10-20 DIAGNOSIS — O4703 False labor before 37 completed weeks of gestation, third trimester: Secondary | ICD-10-CM

## 2017-10-20 DIAGNOSIS — O479 False labor, unspecified: Secondary | ICD-10-CM

## 2017-10-20 DIAGNOSIS — O471 False labor at or after 37 completed weeks of gestation: Secondary | ICD-10-CM | POA: Diagnosis not present

## 2017-10-20 DIAGNOSIS — H6123 Impacted cerumen, bilateral: Secondary | ICD-10-CM | POA: Diagnosis not present

## 2017-10-20 DIAGNOSIS — H9202 Otalgia, left ear: Secondary | ICD-10-CM | POA: Diagnosis not present

## 2017-10-20 DIAGNOSIS — R109 Unspecified abdominal pain: Secondary | ICD-10-CM | POA: Diagnosis present

## 2017-10-20 DIAGNOSIS — R102 Pelvic and perineal pain: Secondary | ICD-10-CM

## 2017-10-20 LAB — URINALYSIS, ROUTINE W REFLEX MICROSCOPIC
Bilirubin Urine: NEGATIVE
Glucose, UA: NEGATIVE mg/dL
Ketones, ur: 80 mg/dL — AB
NITRITE: NEGATIVE
Protein, ur: 30 mg/dL — AB
SPECIFIC GRAVITY, URINE: 1.025 (ref 1.005–1.030)
pH: 6 (ref 5.0–8.0)

## 2017-10-20 LAB — RAPID URINE DRUG SCREEN, HOSP PERFORMED
AMPHETAMINES: NOT DETECTED
Barbiturates: NOT DETECTED
Benzodiazepines: NOT DETECTED
Cocaine: NOT DETECTED
Opiates: NOT DETECTED
TETRAHYDROCANNABINOL: NOT DETECTED

## 2017-10-20 MED ORDER — CARBAMIDE PEROXIDE 6.5 % OT SOLN: 5.0000 [drp] | Freq: Two times a day (BID) | OTIC | 0 refills | Status: DC

## 2017-10-20 MED ORDER — HYDROCORTISONE-ACETIC ACID 1-2 % OT SOLN: 3.0000 [drp] | Freq: Two times a day (BID) | OTIC | 0 refills | Status: DC

## 2017-10-20 NOTE — MAU Provider Note (Signed)
Chief Complaint:  Otalgia and Abdominal Pain   None     HPI: Margaret Cain is a 32 y.o. [id number] at [redacted]w[redacted]d who presents to MAU reporting left ear pain.  Patient states that pain started yesterday.  She denies any fevers or discharge from the ear.  She states that she has had decreased hearing.  Sometimes has pain when she touches her ear.  Patient has no known history of ear infections.  Patient also endorsing increased vaginal pressure.  Does not think that she is contracting enough to be in labor.  Concerned about the pressure. Denies leakage of fluid, vaginal discharge, or vaginal bleeding. Good fetal movement.   Pregnancy Course:  Prenatal care at [facility] for high-risk pregnancy  Past Medical History: Past Medical History:  Diagnosis Date  . Anemia   . Anxiety   . Depression    ppd with first child  . Gestational diabetes    diet controlled  . Headache(784.0)   . Hx of chlamydia infection   . Positive GBS test 03/18/2017  . Pregnancy induced hypertension   . Preterm labor   . Trichomonosis   . Urinary tract infection   . Vaginal Pap smear, abnormal    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    Past obstetric history: OB History  Gravida Para Term Preterm AB Living  8 6 5 1 1 6   SAB TAB Ectopic Multiple Live Births  1 0 0   6    # Outcome Date GA Lbr Len/2nd Weight Sex Delivery Anes PTL Lv  8 Current           7 Term 03/19/16 [redacted]w[redacted]d 01:45 / 00:07 3.32 kg (7 lb 5.1 oz) M Vag-Spont EPI  LIV     Birth Comments: none  6 Term 09/08/14 [redacted]w[redacted]d 03:40 / 00:07 3.155 kg (6 lb 15.3 oz) F Vag-Spont None  LIV  5 Term 09/20/13 [redacted]w[redacted]d 04:10 / 00:31 3.175 kg (6 lb 16 oz) F Vag-Spont EPI  LIV  4 Preterm 07/17/12 [redacted]w[redacted]d 19:05 / 00:12 1.975 kg (4 lb 5.7 oz) M Vag-Spont EPI  LIV  3 SAB 10/05/11          2 Term 10/15/10 [redacted]w[redacted]d  3.232 kg (7 lb 2 oz) M Vag-Spont   LIV  1 Term 2005 [redacted]w[redacted]d  2.466 kg (5 lb 7 oz) M Vag-Spont   LIV      Past Surgical History: Past Surgical History:  Procedure Laterality Date   . COLPOSCOPY       Family History: Family History  Problem Relation Age of Onset  . Asthma Son   . Asthma Paternal Aunt   . Anesthesia problems Neg Hx   . Hypotension Neg Hx   . Malignant hyperthermia Neg Hx   . Pseudochol deficiency Neg Hx   . Other Neg Hx   . Cancer Neg Hx   . Heart disease Neg Hx   . Stroke Neg Hx   . Hearing loss Neg Hx     Social History: Social History   Tobacco Use  . Smoking status: Never Smoker  . Smokeless tobacco: Never Used  Substance Use Topics  . Alcohol use: No  . Drug use: No    Allergies:  Allergies  Allergen Reactions  . Penicillins Nausea And Vomiting    Able to take IV PCN  . Penicillins Nausea And Vomiting    Has patient had a PCN reaction causing immediate rash, facial/tongue/throat swelling, SOB or lightheadedness with hypotension: No Has patient had  a PCN reaction causing severe rash involving mucus membranes or skin necrosis: No Has patient had a PCN reaction that required hospitalization: No Has patient had a PCN reaction occurring within the last 10 years: No If all of the above answers are "NO", then may proceed with Cephalosporin use.     Meds:  Medications Prior to Admission  Medication Sig Dispense Refill Last Dose  . acetaminophen (TYLENOL) 500 MG tablet Take 500 mg by mouth every 6 (six) hours as needed for mild pain or headache.   Past Month at Unknown time  . butalbital-acetaminophen-caffeine (FIORICET, ESGIC) 50-325-40 MG tablet Take 1 tablet by mouth 2 (two) times daily as needed for headache.   Past Month at Unknown time  . cyclobenzaprine (FLEXERIL) 10 MG tablet Take 1 tablet (10 mg total) by mouth 3 (three) times daily as needed for up to 90 doses for muscle spasms. 90 tablet 2 10/14/2017 at Unknown time  . Elastic Bandages & Supports (ABDOMINAL SUPPORT/L-XL) MISC 1 Units by Does not apply route as needed. (Patient not taking: Reported on 09/18/2017) 1 each 0 Not Taking  . Prenatal Vit-Fe Phos-FA-Omega  (VITAFOL GUMMIES) 3.33-0.333-34.8 MG CHEW Chew 1 tablet by mouth daily. 90 tablet 6 10/14/2017 at Unknown time  . terconazole (TERAZOL 7) 0.4 % vaginal cream Place 1 applicator vaginally at bedtime. 45 g 1 Past Week at Unknown time    I have reviewed patient's Past Medical Hx, Surgical Hx, Family Hx, Social Hx, medications and allergies.   ROS:  All systems reviewed and are negative for acute change except as noted in the HPI.   Physical Exam   Patient Vitals for the past 24 hrs:  BP Temp Pulse Resp Height Weight  10/20/17 0846 109/75 97.9 F (36.6 C) (!) 104 18 - -  10/20/17 0842 - - - - 5' (1.524 m) 56.3 kg (124 lb 1.9 oz)   Constitutional: Well-developed, well-nourished female in no acute distress.  HEENT: Head: NCAT Ears: bilateral ears with cerumen impaction unable to visualize TMs. Canals unremarkable. Some tenderness to otoscope examination of left ear.  Neck: no cervical lymphadenopathy Cardiovascular: normal rate and rhythm, pulses intact Respiratory: normal rate and effort.  GI: Abd soft, non-tender, gravid appropriate for gestational age.  MS: Extremities nontender, no edema, normal ROM Neurologic: Alert and oriented x 4.  Speculum: NEFG, physiologic discharge, no blood, cervix clean and visually ~1.5cm dilated and thick Psych: normal mood and affect  Labs: Results for orders placed or performed during the hospital encounter of 10/20/17 (from the past 24 hour(s))  Urinalysis, Routine w reflex microscopic     Status: Abnormal   Collection Time: 10/20/17  8:40 AM  Result Value Ref Range   Color, Urine YELLOW YELLOW   APPearance CLOUDY (A) CLEAR   Specific Gravity, Urine 1.025 1.005 - 1.030   pH 6.0 5.0 - 8.0   Glucose, UA NEGATIVE NEGATIVE mg/dL   Hgb urine dipstick SMALL (A) NEGATIVE   Bilirubin Urine NEGATIVE NEGATIVE   Ketones, ur 80 (A) NEGATIVE mg/dL   Protein, ur 30 (A) NEGATIVE mg/dL   Nitrite NEGATIVE NEGATIVE   Leukocytes, UA MODERATE (A) NEGATIVE    RBC / HPF 0-5 0 - 5 RBC/hpf   WBC, UA 6-30 0 - 5 WBC/hpf   Bacteria, UA MANY (A) NONE SEEN   Squamous Epithelial / LPF 6-30 (A) NONE SEEN   Mucus PRESENT   Urine rapid drug screen (hosp performed)     Status: None   Collection Time:  10/20/17  8:40 AM  Result Value Ref Range   Opiates NONE DETECTED NONE DETECTED   Cocaine NONE DETECTED NONE DETECTED   Benzodiazepines NONE DETECTED NONE DETECTED   Amphetamines NONE DETECTED NONE DETECTED   Tetrahydrocannabinol NONE DETECTED NONE DETECTED   Barbiturates NONE DETECTED NONE DETECTED    Imaging:  No results found.  MAU Course: Vitals and nursing notes reviewed UDS negative Ear exam without signs of OM; possible OE of left ear Not in labor. Unremarkable speculum exam Treatments given in MAU: None  I personally reviewed the patient's NST today, found to be REACTIVE. 145 bpm, mod var, +accels, no decels. CTX: occasional  MDM: Plan of care reviewed with patient, including labs and tests ordered and medical treatment.   Assessment: 1. Otalgia of left ear   2. Bilateral impacted cerumen   3. Braxton Hick's contraction   4. Pelvic pressure in pregnancy     Plan: Discharge home in stable condition.  Reassurance given Rx for Debrox and vosol otic solutions Labor precautions and fetal kick counts reviewed Handout given Follow-up with OB provider   Caryl Ada, DO OB Fellow Center for [facility], [facility] 10/20/2017 8:50 AM

## 2017-10-20 NOTE — Discharge Instructions (Signed)
Use debrox wax removal first on both ears to remove built up wax. This should relieve ear pain. If left ear pain persist despite treatment with wax removal for 3 days then use vosol ear drop.

## 2017-10-20 NOTE — MAU Note (Signed)
Patient presents with onset of left ear pain since yesterday.  Having small amount of abdominal pain.

## 2017-10-22 ENCOUNTER — Ambulatory Visit (INDEPENDENT_AMBULATORY_CARE_PROVIDER_SITE_OTHER): Payer: Medicaid Other | Admitting: Family Medicine

## 2017-10-22 ENCOUNTER — Encounter: Payer: Self-pay | Admitting: General Practice

## 2017-10-22 ENCOUNTER — Encounter: Payer: Self-pay | Admitting: Family Medicine

## 2017-10-22 VITALS — BP 136/63 | HR 91 | Wt 125.2 lb

## 2017-10-22 DIAGNOSIS — R8271 Bacteriuria: Secondary | ICD-10-CM

## 2017-10-22 DIAGNOSIS — O0970 Supervision of high risk pregnancy due to social problems, unspecified trimester: Secondary | ICD-10-CM

## 2017-10-22 DIAGNOSIS — Z8751 Personal history of pre-term labor: Secondary | ICD-10-CM

## 2017-10-22 DIAGNOSIS — O99019 Anemia complicating pregnancy, unspecified trimester: Secondary | ICD-10-CM

## 2017-10-22 LAB — POCT URINALYSIS DIP (DEVICE)
Glucose, UA: 100 mg/dL — AB
HGB URINE DIPSTICK: NEGATIVE
KETONES UR: 15 mg/dL — AB
Nitrite: NEGATIVE
PH: 6 (ref 5.0–8.0)
PROTEIN: 30 mg/dL — AB
Urobilinogen, UA: 4 mg/dL — ABNORMAL HIGH (ref 0.0–1.0)

## 2017-10-22 MED ORDER — DOCUSATE SODIUM 50 MG/5ML PO LIQD: 10.0000 mg | Freq: Once | ORAL | 1 refills | Status: AC

## 2017-10-22 MED ORDER — FERROUS SULFATE 325 (65 FE) MG PO TABS: 325.0000 mg | ORAL_TABLET | Freq: Two times a day (BID) | ORAL | 1 refills | Status: DC

## 2017-10-22 NOTE — Progress Notes (Signed)
Pt states is having pain below stomach & also having back pains.Pt request exam

## 2017-10-22 NOTE — Progress Notes (Signed)
   PRENATAL VISIT NOTE  Subjective:  Margaret Cain is a 32 y.o. [id number] at 3571w4d being seen today for ongoing prenatal care.  She is currently monitored for the following issues for this high-risk pregnancy and has Smoker; Pica; History of preterm delivery; ASCUS with positive high risk HPV cervical; Anemia affecting pregnancy, antepartum; GBS bacteriuria; Supervision of high risk pregnancy due to social problems, antepartum; History of gestational diabetes; Migraine headache; Round ligament pain; Back pain affecting pregnancy; and Unwanted fertility on their problem list.  Patient reports occasional contractions.  Contractions: Irritability. Vag. Bleeding: None.  Movement: Present. Denies leaking of fluid.   The following portions of the patient's history were reviewed and updated as appropriate: allergies, current medications, past family history, past medical history, past social history, past surgical history and problem list. Problem list updated.  Objective:   Vitals:   10/22/17 1318  BP: 136/63  Pulse: 91  Weight: 125 lb 3.2 oz (56.8 kg)    Fetal Status: Fetal Heart Rate (bpm): 145 Fundal Height: 37 cm Movement: Present  Presentation: Vertex  General:  Alert, oriented and cooperative. Patient is in no acute distress.  Skin: Skin is warm and dry. No rash noted.   HEENT: Bilateral cerumen impaction  Cardiovascular: Normal heart rate noted  Respiratory: Normal respiratory effort, no problems with respiration noted  Abdomen: Soft, gravid, appropriate for gestational age.  Pain/Pressure: Present     Pelvic: Cervical exam performed Dilation: 3 Effacement (%): 50 Station: -2  Extremities: Normal range of motion.  Edema: Trace  Mental Status:  Normal mood and affect. Normal behavior. Normal judgment and thought content.   Assessment and Plan:  Pregnancy: [id number] at 6271w4d  1. Supervision of high risk pregnancy due to social problems, antepartum FHT and FH normal  2. History of  preterm delivery  3. Anemia affecting pregnancy, antepartum Ferrous sulfate  4. GBS bacteriuria Intrapartum prophylaxis  5. Cerumen impaction Colace liquid prescribed  Term labor symptoms and general obstetric precautions including but not limited to vaginal bleeding, contractions, leaking of fluid and fetal movement were reviewed in detail with the patient. Please refer to After Visit Summary for other counseling recommendations.  Return in about 1 week (around 10/29/2017) for HR OB f/u.   Levie HeritageJacob J Vaishali Baise, DO

## 2017-10-23 ENCOUNTER — Inpatient Hospital Stay (HOSPITAL_COMMUNITY)
Admission: AD | Admit: 2017-10-23 | Discharge: 2017-10-24 | Disposition: A | Discharge status: Home / Self Care | Payer: Medicaid Other | Source: Ambulatory Visit | Attending: Family Medicine | Admitting: Family Medicine

## 2017-10-23 ENCOUNTER — Telehealth: Payer: Self-pay | Admitting: General Practice

## 2017-10-23 ENCOUNTER — Encounter (HOSPITAL_COMMUNITY): Payer: Self-pay

## 2017-10-23 DIAGNOSIS — H9202 Otalgia, left ear: Secondary | ICD-10-CM | POA: Insufficient documentation

## 2017-10-23 DIAGNOSIS — O9989 Other specified diseases and conditions complicating pregnancy, childbirth and the puerperium: Secondary | ICD-10-CM

## 2017-10-23 DIAGNOSIS — O26893 Other specified pregnancy related conditions, third trimester: Secondary | ICD-10-CM | POA: Diagnosis not present

## 2017-10-23 DIAGNOSIS — H9209 Otalgia, unspecified ear: Secondary | ICD-10-CM | POA: Diagnosis present

## 2017-10-23 DIAGNOSIS — Z3A37 37 weeks gestation of pregnancy: Secondary | ICD-10-CM | POA: Insufficient documentation

## 2017-10-23 DIAGNOSIS — O0970 Supervision of high risk pregnancy due to social problems, unspecified trimester: Secondary | ICD-10-CM

## 2017-10-23 DIAGNOSIS — R8271 Bacteriuria: Secondary | ICD-10-CM

## 2017-10-23 DIAGNOSIS — H6122 Impacted cerumen, left ear: Secondary | ICD-10-CM | POA: Insufficient documentation

## 2017-10-23 DIAGNOSIS — F419 Anxiety disorder, unspecified: Secondary | ICD-10-CM | POA: Diagnosis not present

## 2017-10-23 DIAGNOSIS — O99343 Other mental disorders complicating pregnancy, third trimester: Secondary | ICD-10-CM | POA: Insufficient documentation

## 2017-10-23 DIAGNOSIS — M549 Dorsalgia, unspecified: Secondary | ICD-10-CM | POA: Diagnosis not present

## 2017-10-23 DIAGNOSIS — O99891 Other specified diseases and conditions complicating pregnancy: Secondary | ICD-10-CM

## 2017-10-23 DIAGNOSIS — N949 Unspecified condition associated with female genital organs and menstrual cycle: Secondary | ICD-10-CM

## 2017-10-23 DIAGNOSIS — H6123 Impacted cerumen, bilateral: Secondary | ICD-10-CM

## 2017-10-23 LAB — CBC WITH DIFFERENTIAL/PLATELET
Basophils Absolute: 0 10*3/uL (ref 0.0–0.1)
Basophils Relative: 0 %
Eosinophils Absolute: 0.2 10*3/uL (ref 0.0–0.7)
Eosinophils Relative: 3 %
HCT: 25.1 % — ABNORMAL LOW (ref 36.0–46.0)
Hemoglobin: 8 g/dL — ABNORMAL LOW (ref 12.0–15.0)
LYMPHS ABS: 1.8 10*3/uL (ref 0.7–4.0)
LYMPHS PCT: 26 %
MCH: 25.5 pg — AB (ref 26.0–34.0)
MCHC: 31.9 g/dL (ref 30.0–36.0)
MCV: 79.9 fL (ref 78.0–100.0)
MONO ABS: 0.7 10*3/uL (ref 0.1–1.0)
MONOS PCT: 10 %
Neutro Abs: 4.3 10*3/uL (ref 1.7–7.7)
Neutrophils Relative %: 61 %
Platelets: 160 10*3/uL (ref 150–400)
RBC: 3.14 MIL/uL — ABNORMAL LOW (ref 3.87–5.11)
RDW: 14 % (ref 11.5–15.5)
WBC: 6.9 10*3/uL (ref 4.0–10.5)

## 2017-10-23 LAB — INFLUENZA PANEL BY PCR (TYPE A & B)
Influenza A By PCR: NEGATIVE
Influenza B By PCR: NEGATIVE

## 2017-10-23 MED ORDER — FERROUS SULFATE 325 (65 FE) MG PO TABS: 325.0000 mg | ORAL_TABLET | Freq: Two times a day (BID) | ORAL | 1 refills | Status: DC

## 2017-10-23 MED ORDER — CEFDINIR 300 MG PO CAPS: 300.0000 mg | ORAL_CAPSULE | Freq: Two times a day (BID) | ORAL | 0 refills | Status: AC

## 2017-10-23 MED ORDER — NEOMYCIN-POLYMYXIN-HC 1 % OT SOLN: 3.0000 [drp] | Freq: Three times a day (TID) | OTIC | 0 refills | Status: DC

## 2017-10-23 MED ORDER — ONDANSETRON HCL 4 MG/2ML IJ SOLN
4.0000 mg | Freq: Once | INTRAMUSCULAR | Status: AC
  Administered 2017-10-23: 4 mg via INTRAVENOUS
  Filled 2017-10-23: qty 2

## 2017-10-23 MED ORDER — LACTATED RINGERS IV BOLUS (SEPSIS)
1000.0000 mL | Freq: Once | INTRAVENOUS | Status: AC
  Administered 2017-10-23: 1000 mL via INTRAVENOUS

## 2017-10-23 NOTE — Discharge Instructions (Signed)

## 2017-10-23 NOTE — Telephone Encounter (Signed)
Patient called into front office stating she cannot get her medications for her ear at the pharmacy because they need to be authorized. Told patient I would call the pharmacy & find out what the problem is then I will call her back. Patient verbalized understanding & states she is having a lot of ear pain and wants to be seen. Patient asked where she should go. Recommended she go to urgent care. Patient verbalized understanding & had no questions at this time.   Called patient's pharmacy who states meds are OTC and that's why insurance is denying them. Called patient & discussed with her. Told her she should go to the pharmacy and ask them to show her what meds she needs. Patient verbalized understanding & had no questions

## 2017-10-23 NOTE — MAU Note (Signed)
Pt reporting left ear pain x4 days. States she is waiting for medicaid to approve medication at pharmacy, so she has not picked up prescription. States she has tried tylenol, warm compresses, etc. States she is not able to sleep.

## 2017-10-23 NOTE — MAU Provider Note (Signed)
History     CSN: [id number]  Arrival date and time: 10/23/17 2013   First Provider Initiated Contact with Patient 10/23/17 2118      Chief Complaint  Patient presents with  . Otalgia   HPI Margaret Cain is a 32 y.o. [id number] at [redacted]w[redacted]d who presents with ear pain. Reports 4 day history of left ear pain. Describes as throbbing. Rates pain 9/10. Had previously been prescribed medication for ear but has not been able to fill rx d/t it not being covered by insurance. Also reports throat pain, cough, & n/v. Denies fever/chills. Positive fetal movement.   OB History    Gravida Para Term Preterm AB Living   8 6 5 1 1 6    SAB TAB Ectopic Multiple Live Births   1 0 0   6      Past Medical History:  Diagnosis Date  . Anemia   . Anxiety   . Depression    ppd with first child  . Gestational diabetes    diet controlled  . Headache(784.0)   . Hx of chlamydia infection   . Positive GBS test 03/18/2017  . Pregnancy induced hypertension   . Preterm labor   . Trichomonosis   . Urinary tract infection   . 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 COLPOSCOPY      Family History  Problem Relation Age of Onset  . Asthma Son   . Asthma Paternal Aunt   . Anesthesia problems Neg Hx   . Hypotension Neg Hx   . Malignant hyperthermia Neg Hx   . Pseudochol deficiency Neg Hx   . Other Neg Hx   . Cancer Neg Hx   . Heart disease Neg Hx   . Stroke Neg Hx   . Hearing loss Neg Hx     Social History   Tobacco Use  . Smoking status: Never Smoker  . Smokeless tobacco: Never Used  Substance Use Topics  . Alcohol use: No  . Drug use: No    Allergies:  Allergies  Allergen Reactions  . Penicillins Nausea And Vomiting    Able to take IV PCN  . Penicillins Nausea And Vomiting    Has patient had a PCN reaction causing immediate rash, facial/tongue/throat swelling, SOB or lightheadedness with hypotension: No Has  patient had a PCN reaction causing severe rash involving mucus membranes or skin necrosis: No Has patient had a PCN reaction that required hospitalization: No Has patient had a PCN reaction occurring within the last 10 years: No If all of the above answers are "NO", then may proceed with Cephalosporin use.     Medications Prior to Admission  Medication Sig Dispense Refill Last Dose  . acetaminophen (TYLENOL) 500 MG tablet Take 500 mg by mouth every 6 (six) hours as needed for mild pain or headache.   10/23/2017 at Unknown time  . cyclobenzaprine (FLEXERIL) 10 MG tablet Take 1 tablet (10 mg total) by mouth 3 (three) times daily as needed for up to 90 doses for muscle spasms. 90 tablet 2 Past Month at Unknown time  . Prenatal Vit-Fe Phos-FA-Omega (VITAFOL GUMMIES) 3.33-0.333-34.8 MG CHEW Chew 1 tablet by mouth daily. 90 tablet 6 10/23/2017 at Unknown time  . acetic acid-hydrocortisone (VOSOL-HC) OTIC solution Place 3 drops into the left ear 2 (two) times daily. 10 mL 0   . butalbital-acetaminophen-caffeine (FIORICET, ESGIC) 50-325-40 MG tablet Take 1  tablet by mouth 2 (two) times daily as needed for headache.   Past Month at Unknown time  . carbamide peroxide (DEBROX) 6.5 % OTIC solution Place 5 drops into both ears 2 (two) times daily. 15 mL 0   . Elastic Bandages & Supports (ABDOMINAL SUPPORT/L-XL) MISC 1 Units by Does not apply route as needed. (Patient not taking: Reported on 09/18/2017) 1 each 0 Not Taking  . ferrous sulfate (FERROUSUL) 325 (65 FE) MG tablet Take 1 tablet (325 mg total) by mouth 2 (two) times daily. 60 tablet 1   . terconazole (TERAZOL 7) 0.4 % vaginal cream Place 1 applicator vaginally at bedtime. 45 g 1 Past Week at Unknown time    Review of Systems  Constitutional: Negative.   HENT: Positive for ear pain and sore throat. Negative for tinnitus.   Respiratory: Positive for cough.   Gastrointestinal: Positive for vomiting. Negative for abdominal pain and nausea.   Musculoskeletal: Negative for myalgias.   Physical Exam   Blood pressure 128/78, pulse (!) 109, temperature 98.9 F (37.2 C), temperature source Oral, resp. rate 18, height 5' (1.524 m), weight 128 lb (58.1 kg), last menstrual period 02/01/2017, SpO2 99 %, unknown if currently breastfeeding.  Physical Exam  Nursing note and vitals reviewed. Constitutional: She is oriented to person, place, and time. She appears well-developed and well-nourished. No distress.  HENT:  Head: Normocephalic and atraumatic.  Left Ear: No mastoid tenderness.  Unable to visualize TMs d/t cerumen + Left tragal tenderness  Eyes: Conjunctivae are normal. Right eye exhibits no discharge. Left eye exhibits no discharge. No scleral icterus.  Neck: Normal range of motion.  Cardiovascular: Regular rhythm and normal heart sounds. Tachycardia present.  No murmur heard. Respiratory: Effort normal and breath sounds normal. No respiratory distress. She has no wheezes.  Lymphadenopathy:       Head (right side): No preauricular and no posterior auricular adenopathy present.       Head (left side): Submandibular adenopathy present. No preauricular and no posterior auricular adenopathy present.  Neurological: She is alert and oriented to person, place, and time.  Skin: Skin is warm and dry. She is not diaphoretic.  Psychiatric: She has a normal mood and affect. Her behavior is normal. Judgment and thought content normal.    MAU Course  Procedures Results for orders placed or performed during the hospital encounter of 10/23/17 (from the past 24 hour(s))  CBC with Differential/Platelet     Status: Abnormal (Preliminary result)   Collection Time: 10/23/17  9:57 PM  Result Value Ref Range   WBC 6.9 4.0 - 10.5 K/uL   RBC 3.14 (L) 3.87 - 5.11 MIL/uL   Hemoglobin 8.0 (L) 12.0 - 15.0 g/dL   HCT 69.6 (L) 29.5 - 28.4 %   MCV 79.9 78.0 - 100.0 fL   MCH 25.5 (L) 26.0 - 34.0 pg   MCHC 31.9 30.0 - 36.0 g/dL   RDW 13.2 44.0 -  10.2 %   Platelets 160 150 - 400 K/uL   Neutrophils Relative % 61 %   Neutro Abs 4.3 1.7 - 7.7 K/uL   Lymphocytes Relative 26 %   Lymphs Abs 1.8 0.7 - 4.0 K/uL   Monocytes Relative 10 %   Monocytes Absolute 0.7 0.1 - 1.0 K/uL   Eosinophils Relative 3 %   Eosinophils Absolute 0.2 0.0 - 0.7 K/uL   Basophils Relative 0 %   Basophils Absolute 0.0 0.0 - 0.1 K/uL   Other PENDING %    MDM  NST:  Baseline: 140 bpm, Variability: Good {> 6 bpm), Accelerations: Reactive and Decelerations: ?variable vs MHR tracing  FHT 190s by doppler in triage. Fetal tachycardia vs prolonged acceleration during monitoring. IV fluids bolus started & FHR returned to a normal baseline & reactive.  D/t pt complaints & fetal tachycardia, strep & flu swabs collected. CBC w/diff pending C/w Dr. Adrian BlackwaterStinson who has previously evaluated patient for same complaint yesterday. Will attempt to clean out cerumen impaction in MAU & reassess TM.   Care turned over to Dr. Randol KernPhelps     Margaret Cain, Margaret PeonErin, NP 10/23/2017 10:22 PM   Unsuccessful attempt at cerumen removal Flu and strep swabs negative No leukocytosis   Assessment and Plan  1. Otalgia 2. Cerumen impaction 3. ?Otitis media vs externa  -Rx for  Cortisporin prescribed (insurance able to cover unlike other scripts prior) -Rx for 5 day course of cefdinir (patient with intolerance to penicillin so wanted to avoid) -encouraged OTC wax removal drops to help soften wax -discharge home ins table condition -return precautions given  Caryl AdaJazma Phelps, DO OB Fellow

## 2017-10-24 DIAGNOSIS — O9989 Other specified diseases and conditions complicating pregnancy, childbirth and the puerperium: Secondary | ICD-10-CM | POA: Diagnosis not present

## 2017-10-24 DIAGNOSIS — H9202 Otalgia, left ear: Secondary | ICD-10-CM | POA: Diagnosis not present

## 2017-10-24 DIAGNOSIS — M549 Dorsalgia, unspecified: Secondary | ICD-10-CM | POA: Diagnosis not present

## 2017-10-24 LAB — RAPID STREP SCREEN (MED CTR MEBANE ONLY): Streptococcus, Group A Screen (Direct): NEGATIVE

## 2017-10-24 NOTE — MAU Note (Signed)
The e-signature was not working in room. I had the pt sign the paper copy.

## 2017-10-26 LAB — CULTURE, GROUP A STREP (THRC)

## 2017-10-29 ENCOUNTER — Encounter: Payer: Self-pay | Admitting: Obstetrics and Gynecology

## 2017-10-29 ENCOUNTER — Encounter: Payer: Self-pay | Admitting: General Practice

## 2017-10-29 ENCOUNTER — Ambulatory Visit (INDEPENDENT_AMBULATORY_CARE_PROVIDER_SITE_OTHER): Payer: Medicaid Other | Admitting: Obstetrics and Gynecology

## 2017-10-29 VITALS — BP 131/77 | HR 92 | Wt 127.3 lb

## 2017-10-29 DIAGNOSIS — R87610 Atypical squamous cells of undetermined significance on cytologic smear of cervix (ASC-US): Secondary | ICD-10-CM

## 2017-10-29 DIAGNOSIS — Z8751 Personal history of pre-term labor: Secondary | ICD-10-CM

## 2017-10-29 DIAGNOSIS — R8271 Bacteriuria: Secondary | ICD-10-CM

## 2017-10-29 DIAGNOSIS — O0973 Supervision of high risk pregnancy due to social problems, third trimester: Secondary | ICD-10-CM

## 2017-10-29 DIAGNOSIS — R87810 Cervical high risk human papillomavirus (HPV) DNA test positive: Secondary | ICD-10-CM

## 2017-10-29 DIAGNOSIS — O09213 Supervision of pregnancy with history of pre-term labor, third trimester: Secondary | ICD-10-CM

## 2017-10-29 DIAGNOSIS — O99019 Anemia complicating pregnancy, unspecified trimester: Secondary | ICD-10-CM

## 2017-10-29 DIAGNOSIS — D649 Anemia, unspecified: Secondary | ICD-10-CM

## 2017-10-29 DIAGNOSIS — O0970 Supervision of high risk pregnancy due to social problems, unspecified trimester: Secondary | ICD-10-CM

## 2017-10-29 DIAGNOSIS — O99013 Anemia complicating pregnancy, third trimester: Secondary | ICD-10-CM

## 2017-10-29 NOTE — Progress Notes (Signed)
   PRENATAL VISIT NOTE  Subjective:  Margaret Cain is a 32 y.o. [id number] at 158w4d being seen today for ongoing prenatal care.  She is currently monitored for the following issues for this high-risk pregnancy and has Smoker; Pica; History of preterm delivery; ASCUS with positive high risk HPV cervical; Anemia affecting pregnancy, antepartum; GBS bacteriuria; Supervision of high risk pregnancy due to social problems, antepartum; History of gestational diabetes; Migraine headache; Round ligament pain; Back pain affecting pregnancy; and Unwanted fertility on their problem list.  Patient reports low back pain and occasional contractions.  Contractions: Irregular. Vag. Bleeding: None.  Movement: Present. Denies leaking of fluid.   The following portions of the patient's history were reviewed and updated as appropriate: allergies, current medications, past family history, past medical history, past social history, past surgical history and problem list. Problem list updated.  Objective:   Vitals:   10/29/17 1330  BP: 131/77  Pulse: 92  Weight: 127 lb 4.8 oz (57.7 kg)    Fetal Status: Fetal Heart Rate (bpm): 148 Fundal Height: 38 cm Movement: Present     General:  Alert, oriented and cooperative. Patient is in no acute distress.  Skin: Skin is warm and dry. No rash noted.   Cardiovascular: Normal heart rate noted  Respiratory: Normal respiratory effort, no problems with respiration noted  Abdomen: Soft, gravid, appropriate for gestational age.  Pain/Pressure: Present     Pelvic: 3/50/-2        Extremities: Normal range of motion.  Edema: Trace  Mental Status:  Normal mood and affect. Normal behavior. Normal judgment and thought content.   Assessment and Plan:  Pregnancy: [id number] at 618w4d  1. Supervision of high risk pregnancy due to social problems, antepartum Requesting IOL at 40 weeks so they can arrange for caretaker for baby to be off work (in event CPS doesn't allow her custody of  infant, does not have custody of other children)  Reviewed risks of elective induction of labor, patient verbalizes understanding Scheduled for 11/08/17  2. GBS bacteriuria ppx in labor  3. Anemia affecting pregnancy, antepartum Will accept blood transfusion  4. History of preterm delivery  5. ASCUS with positive high risk HPV cervical Pap pp  Term labor symptoms and general obstetric precautions including but not limited to vaginal bleeding, contractions, leaking of fluid and fetal movement were reviewed in detail with the patient. Please refer to After Visit Summary for other counseling recommendations.  Return in about 1 week (around 11/05/2017) for OB visit.   Margaret BowensKelly M Amery Minasyan, MD

## 2017-10-30 ENCOUNTER — Encounter: Payer: Self-pay | Admitting: *Deleted

## 2017-10-30 ENCOUNTER — Inpatient Hospital Stay (HOSPITAL_COMMUNITY)
Admission: AD | Admit: 2017-10-30 | Discharge: 2017-10-31 | Disposition: A | Discharge status: Home / Self Care | Payer: Medicaid Other | Source: Ambulatory Visit | Attending: Family Medicine | Admitting: Family Medicine

## 2017-10-30 ENCOUNTER — Telehealth (HOSPITAL_COMMUNITY): Payer: Self-pay | Admitting: *Deleted

## 2017-10-30 ENCOUNTER — Encounter (HOSPITAL_COMMUNITY): Payer: Self-pay | Admitting: *Deleted

## 2017-10-30 DIAGNOSIS — O0970 Supervision of high risk pregnancy due to social problems, unspecified trimester: Secondary | ICD-10-CM

## 2017-10-30 DIAGNOSIS — Z3A39 39 weeks gestation of pregnancy: Secondary | ICD-10-CM | POA: Insufficient documentation

## 2017-10-30 DIAGNOSIS — R8271 Bacteriuria: Secondary | ICD-10-CM

## 2017-10-30 DIAGNOSIS — O479 False labor, unspecified: Secondary | ICD-10-CM

## 2017-10-30 DIAGNOSIS — N949 Unspecified condition associated with female genital organs and menstrual cycle: Secondary | ICD-10-CM

## 2017-10-30 DIAGNOSIS — O9989 Other specified diseases and conditions complicating pregnancy, childbirth and the puerperium: Secondary | ICD-10-CM

## 2017-10-30 DIAGNOSIS — Z3483 Encounter for supervision of other normal pregnancy, third trimester: Secondary | ICD-10-CM | POA: Insufficient documentation

## 2017-10-30 DIAGNOSIS — M549 Dorsalgia, unspecified: Secondary | ICD-10-CM

## 2017-10-30 NOTE — Telephone Encounter (Signed)
Preadmission screen  

## 2017-10-31 DIAGNOSIS — Z3483 Encounter for supervision of other normal pregnancy, third trimester: Secondary | ICD-10-CM | POA: Diagnosis not present

## 2017-10-31 DIAGNOSIS — Z3A39 39 weeks gestation of pregnancy: Secondary | ICD-10-CM | POA: Diagnosis not present

## 2017-10-31 LAB — POCT FERN TEST: POCT FERN TEST: NEGATIVE

## 2017-10-31 NOTE — MAU Note (Signed)
I have communicated with Dr Rushie GoltzLeland and reviewed vital signs:  Vitals:   10/31/17 0013 10/31/17 0143  BP: 121/83 113/71  Pulse: (!) 104   Resp: 18   Temp: 98.2 F (36.8 C)     Vaginal exam:  Dilation: 3 Effacement (%): 50 Cervical Position: Posterior Exam by:: Roxan Hockeyraci Queen Abbett RN,   Also reviewed contraction pattern and that non-stress test is reactive.  It has been documented that patient is contracting irregularly  minutes no cervical change since last exam on 1/7,  not indicating active labor.  Patient denies any other complaints.  Based on this report provider has given order for discharge.  A discharge order and diagnosis entered by a provider.   Labor discharge instructions reviewed with patient.

## 2017-10-31 NOTE — MAU Note (Signed)
Pt states she's been having intermittent back pain and lower abdominal pressure all day.  Unsure of leaking; denies bleeding. +FM

## 2017-11-05 ENCOUNTER — Other Ambulatory Visit: Payer: Self-pay | Admitting: Certified Nurse Midwife

## 2017-11-05 ENCOUNTER — Ambulatory Visit (INDEPENDENT_AMBULATORY_CARE_PROVIDER_SITE_OTHER): Payer: Medicaid Other | Admitting: Family Medicine

## 2017-11-05 VITALS — BP 122/77 | HR 111 | Wt 129.7 lb

## 2017-11-05 DIAGNOSIS — O0973 Supervision of high risk pregnancy due to social problems, third trimester: Secondary | ICD-10-CM

## 2017-11-05 DIAGNOSIS — O09213 Supervision of pregnancy with history of pre-term labor, third trimester: Secondary | ICD-10-CM

## 2017-11-05 DIAGNOSIS — D649 Anemia, unspecified: Secondary | ICD-10-CM

## 2017-11-05 DIAGNOSIS — Z8751 Personal history of pre-term labor: Secondary | ICD-10-CM

## 2017-11-05 DIAGNOSIS — O0970 Supervision of high risk pregnancy due to social problems, unspecified trimester: Secondary | ICD-10-CM

## 2017-11-05 DIAGNOSIS — R8271 Bacteriuria: Secondary | ICD-10-CM

## 2017-11-05 DIAGNOSIS — R87810 Cervical high risk human papillomavirus (HPV) DNA test positive: Secondary | ICD-10-CM

## 2017-11-05 DIAGNOSIS — R87610 Atypical squamous cells of undetermined significance on cytologic smear of cervix (ASC-US): Secondary | ICD-10-CM

## 2017-11-05 DIAGNOSIS — O99019 Anemia complicating pregnancy, unspecified trimester: Secondary | ICD-10-CM

## 2017-11-05 DIAGNOSIS — O99013 Anemia complicating pregnancy, third trimester: Secondary | ICD-10-CM

## 2017-11-05 NOTE — Progress Notes (Signed)
   PRENATAL VISIT NOTE  Subjective:  Margaret Cain is a 32 y.o. [id number] at 4043w4d being seen today for ongoing prenatal care.  She is currently monitored for the following issues for this high-risk pregnancy and has Smoker; Pica; History of preterm delivery; ASCUS with positive high risk HPV cervical; Anemia affecting pregnancy, antepartum; GBS bacteriuria; Supervision of high risk pregnancy due to social problems, antepartum; History of gestational diabetes; Migraine headache; Round ligament pain; Back pain affecting pregnancy; and Unwanted fertility on their problem list.  Patient reports occasional contractions.  Contractions: Irregular. Vag. Bleeding: None.  Movement: Present. Denies leaking of fluid.   The following portions of the patient's history were reviewed and updated as appropriate: allergies, current medications, past family history, past medical history, past social history, past surgical history and problem list. Problem list updated.  Objective:   Vitals:   11/05/17 1320  BP: 122/77  Pulse: (!) 111  Weight: 129 lb 11.2 oz (58.8 kg)    Fetal Status: Fetal Heart Rate (bpm): 150 Fundal Height: 39 cm Movement: Present  Presentation: Vertex  General:  Alert, oriented and cooperative. Patient is in no acute distress.  Skin: Skin is warm and dry. No rash noted.   Cardiovascular: Normal heart rate noted  Respiratory: Normal respiratory effort, no problems with respiration noted  Abdomen: Soft, gravid, appropriate for gestational age.  Pain/Pressure: Present     Pelvic: Cervical exam performed Dilation: 3 Effacement (%): 50 Station: -2  Extremities: Normal range of motion.  Edema: Trace  Mental Status:  Normal mood and affect. Normal behavior. Normal judgment and thought content.   Assessment and Plan:  Pregnancy: [id number] at 5943w4d  1. Supervision of high risk pregnancy due to social problems, antepartum FHT and FH  2. GBS bacteriuria Intrapartum prophylax  3. Anemia  affecting pregnancy, antepartum   4. History of preterm delivery  5. ASCUS with positive high risk HPV cervical repap PP  Term labor symptoms and general obstetric precautions including but not limited to vaginal bleeding, contractions, leaking of fluid and fetal movement were reviewed in detail with the patient. Please refer to After Visit Summary for other counseling recommendations.  No Follow-up on file.   Levie HeritageJacob J Keesha Pellum, DO

## 2017-11-06 ENCOUNTER — Encounter (HOSPITAL_COMMUNITY): Payer: Self-pay

## 2017-11-06 ENCOUNTER — Inpatient Hospital Stay (HOSPITAL_COMMUNITY)
Admission: AD | Admit: 2017-11-06 | Discharge: 2017-11-09 | DRG: 807 | Disposition: A | Discharge status: Home / Self Care | Payer: Medicaid Other | Source: Ambulatory Visit | Attending: Obstetrics and Gynecology | Admitting: Obstetrics and Gynecology

## 2017-11-06 DIAGNOSIS — O0970 Supervision of high risk pregnancy due to social problems, unspecified trimester: Secondary | ICD-10-CM

## 2017-11-06 DIAGNOSIS — O99824 Streptococcus B carrier state complicating childbirth: Secondary | ICD-10-CM | POA: Diagnosis present

## 2017-11-06 DIAGNOSIS — Z87891 Personal history of nicotine dependence: Secondary | ICD-10-CM

## 2017-11-06 DIAGNOSIS — Z3A39 39 weeks gestation of pregnancy: Secondary | ICD-10-CM

## 2017-11-06 DIAGNOSIS — N949 Unspecified condition associated with female genital organs and menstrual cycle: Secondary | ICD-10-CM

## 2017-11-06 DIAGNOSIS — O9902 Anemia complicating childbirth: Secondary | ICD-10-CM | POA: Diagnosis present

## 2017-11-06 DIAGNOSIS — Z880 Allergy status to penicillin: Secondary | ICD-10-CM

## 2017-11-06 DIAGNOSIS — R8271 Bacteriuria: Secondary | ICD-10-CM

## 2017-11-06 DIAGNOSIS — O4290 Premature rupture of membranes, unspecified as to length of time between rupture and onset of labor, unspecified weeks of gestation: Secondary | ICD-10-CM | POA: Diagnosis present

## 2017-11-06 DIAGNOSIS — O4292 Full-term premature rupture of membranes, unspecified as to length of time between rupture and onset of labor: Principal | ICD-10-CM | POA: Diagnosis present

## 2017-11-06 DIAGNOSIS — D649 Anemia, unspecified: Secondary | ICD-10-CM | POA: Diagnosis present

## 2017-11-06 NOTE — MAU Note (Signed)
Pt here with c/o contractions all day. Had membranes stripped yesterday and was 3cm. Denies any bleeding or leaking. Reports good fetal movement.

## 2017-11-07 ENCOUNTER — Inpatient Hospital Stay (HOSPITAL_COMMUNITY): Payer: Medicaid Other | Admitting: Anesthesiology

## 2017-11-07 ENCOUNTER — Encounter (HOSPITAL_COMMUNITY): Payer: Self-pay

## 2017-11-07 DIAGNOSIS — O4290 Premature rupture of membranes, unspecified as to length of time between rupture and onset of labor, unspecified weeks of gestation: Secondary | ICD-10-CM | POA: Diagnosis present

## 2017-11-07 DIAGNOSIS — Z3A39 39 weeks gestation of pregnancy: Secondary | ICD-10-CM | POA: Diagnosis not present

## 2017-11-07 DIAGNOSIS — O9902 Anemia complicating childbirth: Secondary | ICD-10-CM | POA: Diagnosis present

## 2017-11-07 DIAGNOSIS — O4292 Full-term premature rupture of membranes, unspecified as to length of time between rupture and onset of labor: Secondary | ICD-10-CM | POA: Diagnosis present

## 2017-11-07 DIAGNOSIS — O99824 Streptococcus B carrier state complicating childbirth: Secondary | ICD-10-CM | POA: Diagnosis present

## 2017-11-07 DIAGNOSIS — D649 Anemia, unspecified: Secondary | ICD-10-CM | POA: Diagnosis present

## 2017-11-07 DIAGNOSIS — Z87891 Personal history of nicotine dependence: Secondary | ICD-10-CM | POA: Diagnosis not present

## 2017-11-07 DIAGNOSIS — Z880 Allergy status to penicillin: Secondary | ICD-10-CM | POA: Diagnosis not present

## 2017-11-07 LAB — TYPE AND SCREEN
ABO/RH(D): O POS
Antibody Screen: NEGATIVE

## 2017-11-07 LAB — CBC
HEMATOCRIT: 27.8 % — AB (ref 36.0–46.0)
HEMOGLOBIN: 8.7 g/dL — AB (ref 12.0–15.0)
MCH: 25 pg — AB (ref 26.0–34.0)
MCHC: 31.3 g/dL (ref 30.0–36.0)
MCV: 79.9 fL (ref 78.0–100.0)
Platelets: 200 10*3/uL (ref 150–400)
RBC: 3.48 MIL/uL — ABNORMAL LOW (ref 3.87–5.11)
RDW: 14.7 % (ref 11.5–15.5)
WBC: 10.2 10*3/uL (ref 4.0–10.5)

## 2017-11-07 LAB — RPR: RPR Ser Ql: NONREACTIVE

## 2017-11-07 MED ORDER — SENNOSIDES-DOCUSATE SODIUM 8.6-50 MG PO TABS
2.0000 | ORAL_TABLET | ORAL | Status: DC
  Administered 2017-11-07 – 2017-11-08 (×2): 2 via ORAL
  Filled 2017-11-07 (×2): qty 2

## 2017-11-07 MED ORDER — LIDOCAINE HCL (PF) 1 % IJ SOLN
INTRAMUSCULAR | Status: DC | PRN
  Administered 2017-11-07: 5 mL via EPIDURAL

## 2017-11-07 MED ORDER — WITCH HAZEL-GLYCERIN EX PADS: 1.0000 "application " | MEDICATED_PAD | CUTANEOUS | Status: DC | PRN

## 2017-11-07 MED ORDER — BENZOCAINE-MENTHOL 20-0.5 % EX AERO: 1.0000 "application " | INHALATION_SPRAY | CUTANEOUS | Status: DC | PRN

## 2017-11-07 MED ORDER — ACETAMINOPHEN 325 MG PO TABS
650.0000 mg | ORAL_TABLET | ORAL | Status: DC | PRN
  Administered 2017-11-07: 650 mg via ORAL
  Filled 2017-11-07: qty 2

## 2017-11-07 MED ORDER — DIPHENHYDRAMINE HCL 50 MG/ML IJ SOLN: 12.5000 mg | INTRAMUSCULAR | Status: DC | PRN

## 2017-11-07 MED ORDER — LIDOCAINE HCL (PF) 1 % IJ SOLN
30.0000 mL | INTRAMUSCULAR | Status: DC | PRN
  Filled 2017-11-07: qty 30

## 2017-11-07 MED ORDER — PHENYLEPHRINE 40 MCG/ML (10ML) SYRINGE FOR IV PUSH (FOR BLOOD PRESSURE SUPPORT)
80.0000 ug | PREFILLED_SYRINGE | INTRAVENOUS | Status: DC | PRN
  Filled 2017-11-07: qty 5

## 2017-11-07 MED ORDER — PRENATAL MULTIVITAMIN CH
1.0000 | ORAL_TABLET | Freq: Every day | ORAL | Status: DC
  Administered 2017-11-07 – 2017-11-09 (×3): 1 via ORAL
  Filled 2017-11-07 (×3): qty 1

## 2017-11-07 MED ORDER — CEFAZOLIN SODIUM-DEXTROSE 1-4 GM/50ML-% IV SOLN
1.0000 g | Freq: Three times a day (TID) | INTRAVENOUS | Status: DC
  Administered 2017-11-07: 1 g via INTRAVENOUS
  Filled 2017-11-07: qty 50

## 2017-11-07 MED ORDER — LACTATED RINGERS IV SOLN: 500.0000 mL | INTRAVENOUS | Status: DC | PRN

## 2017-11-07 MED ORDER — CEFAZOLIN SODIUM-DEXTROSE 1-4 GM/50ML-% IV SOLN
1.0000 g | Freq: Once | INTRAVENOUS | Status: AC
  Administered 2017-11-07: 1 g via INTRAVENOUS
  Filled 2017-11-07: qty 50

## 2017-11-07 MED ORDER — ZOLPIDEM TARTRATE 5 MG PO TABS: 5.0000 mg | ORAL_TABLET | Freq: Every evening | ORAL | Status: DC | PRN

## 2017-11-07 MED ORDER — LACTATED RINGERS IV SOLN: 500.0000 mL | Freq: Once | INTRAVENOUS | Status: DC

## 2017-11-07 MED ORDER — CEFAZOLIN SODIUM-DEXTROSE 2-4 GM/100ML-% IV SOLN: 2.0000 g | Freq: Once | INTRAVENOUS | Status: DC

## 2017-11-07 MED ORDER — DIBUCAINE 1 % RE OINT: 1.0000 "application " | TOPICAL_OINTMENT | RECTAL | Status: DC | PRN

## 2017-11-07 MED ORDER — ONDANSETRON HCL 4 MG/2ML IJ SOLN: 4.0000 mg | INTRAMUSCULAR | Status: DC | PRN

## 2017-11-07 MED ORDER — FENTANYL CITRATE (PF) 100 MCG/2ML IJ SOLN: 100.0000 ug | INTRAMUSCULAR | Status: DC | PRN

## 2017-11-07 MED ORDER — PHENYLEPHRINE 40 MCG/ML (10ML) SYRINGE FOR IV PUSH (FOR BLOOD PRESSURE SUPPORT)
80.0000 ug | PREFILLED_SYRINGE | INTRAVENOUS | Status: DC | PRN
Start: 2017-11-07 — End: 2017-11-07
  Filled 2017-11-07: qty 5

## 2017-11-07 MED ORDER — SOD CITRATE-CITRIC ACID 500-334 MG/5ML PO SOLN: 30.0000 mL | ORAL | Status: DC | PRN

## 2017-11-07 MED ORDER — EPHEDRINE 5 MG/ML INJ
10.0000 mg | INTRAVENOUS | Status: DC | PRN
  Filled 2017-11-07: qty 2

## 2017-11-07 MED ORDER — PHENYLEPHRINE 40 MCG/ML (10ML) SYRINGE FOR IV PUSH (FOR BLOOD PRESSURE SUPPORT)
PREFILLED_SYRINGE | INTRAVENOUS | Status: AC
  Filled 2017-11-07: qty 10

## 2017-11-07 MED ORDER — FENTANYL 2.5 MCG/ML BUPIVACAINE 1/10 % EPIDURAL INFUSION (WH - ANES)
INTRAMUSCULAR | Status: AC
  Filled 2017-11-07: qty 100

## 2017-11-07 MED ORDER — ONDANSETRON HCL 4 MG PO TABS: 4.0000 mg | ORAL_TABLET | ORAL | Status: DC | PRN

## 2017-11-07 MED ORDER — IBUPROFEN 600 MG PO TABS
600.0000 mg | ORAL_TABLET | Freq: Four times a day (QID) | ORAL | Status: DC
  Administered 2017-11-07 – 2017-11-09 (×9): 600 mg via ORAL
  Filled 2017-11-07 (×9): qty 1

## 2017-11-07 MED ORDER — OXYCODONE-ACETAMINOPHEN 5-325 MG PO TABS
1.0000 | ORAL_TABLET | Freq: Four times a day (QID) | ORAL | Status: DC | PRN
  Administered 2017-11-07 – 2017-11-09 (×3): 1 via ORAL
  Filled 2017-11-07 (×3): qty 1

## 2017-11-07 MED ORDER — LACTATED RINGERS IV SOLN
INTRAVENOUS | Status: DC
  Administered 2017-11-07: 06:00:00 via INTRAVENOUS

## 2017-11-07 MED ORDER — TETANUS-DIPHTH-ACELL PERTUSSIS 5-2.5-18.5 LF-MCG/0.5 IM SUSP: 0.5000 mL | Freq: Once | INTRAMUSCULAR | Status: DC

## 2017-11-07 MED ORDER — OXYTOCIN BOLUS FROM INFUSION
500.0000 mL | Freq: Once | INTRAVENOUS | Status: AC
  Administered 2017-11-07: 500 mL via INTRAVENOUS

## 2017-11-07 MED ORDER — OXYCODONE-ACETAMINOPHEN 5-325 MG PO TABS: 1.0000 | ORAL_TABLET | ORAL | Status: DC | PRN

## 2017-11-07 MED ORDER — OXYTOCIN 40 UNITS IN LACTATED RINGERS INFUSION - SIMPLE MED
2.5000 [IU]/h | INTRAVENOUS | Status: DC
  Administered 2017-11-07: 2.5 [IU]/h via INTRAVENOUS
  Filled 2017-11-07: qty 1000

## 2017-11-07 MED ORDER — DIPHENHYDRAMINE HCL 25 MG PO CAPS: 25.0000 mg | ORAL_CAPSULE | Freq: Four times a day (QID) | ORAL | Status: DC | PRN

## 2017-11-07 MED ORDER — FENTANYL 2.5 MCG/ML BUPIVACAINE 1/10 % EPIDURAL INFUSION (WH - ANES)
14.0000 mL/h | INTRAMUSCULAR | Status: DC | PRN
  Administered 2017-11-07: 14 mL/h via EPIDURAL

## 2017-11-07 MED ORDER — SIMETHICONE 80 MG PO CHEW
80.0000 mg | CHEWABLE_TABLET | ORAL | Status: DC | PRN
  Administered 2017-11-09: 80 mg via ORAL
  Filled 2017-11-07: qty 1

## 2017-11-07 MED ORDER — COCONUT OIL OIL: 1.0000 "application " | TOPICAL_OIL | Status: DC | PRN

## 2017-11-07 MED ORDER — ACETAMINOPHEN 325 MG PO TABS: 650.0000 mg | ORAL_TABLET | ORAL | Status: DC | PRN

## 2017-11-07 MED ORDER — OXYCODONE-ACETAMINOPHEN 5-325 MG PO TABS: 2.0000 | ORAL_TABLET | ORAL | Status: DC | PRN

## 2017-11-07 MED ORDER — ONDANSETRON HCL 4 MG/2ML IJ SOLN
4.0000 mg | Freq: Four times a day (QID) | INTRAMUSCULAR | Status: DC | PRN
  Administered 2017-11-07: 4 mg via INTRAVENOUS
  Filled 2017-11-07: qty 2

## 2017-11-07 NOTE — Anesthesia Preprocedure Evaluation (Signed)
Anesthesia Evaluation  Patient identified by MRN, date of birth, ID band Patient awake    Reviewed: Allergy & Precautions, Patient's Chart, lab work & pertinent test results  Airway Mallampati: I       Dental   Pulmonary    Pulmonary exam normal        Cardiovascular hypertension, Normal cardiovascular exam     Neuro/Psych  Headaches, PSYCHIATRIC DISORDERS Anxiety Depression    GI/Hepatic negative GI ROS, Neg liver ROS,   Endo/Other  diabetes  Renal/GU      Musculoskeletal negative musculoskeletal ROS (+)   Abdominal   Peds  Hematology   Anesthesia Other Findings   Reproductive/Obstetrics (+) Pregnancy                             Anesthesia Physical Anesthesia Plan  ASA: II  Anesthesia Plan: Epidural   Post-op Pain Management:    Induction:   PONV Risk Score and Plan:   Airway Management Planned:   Additional Equipment:   Intra-op Plan:   Post-operative Plan:   Informed Consent: I have reviewed the patients History and Physical, chart, labs and discussed the procedure including the risks, benefits and alternatives for the proposed anesthesia with the patient or authorized representative who has indicated his/her understanding and acceptance.     Plan Discussed with:   Anesthesia Plan Comments: (Lab Results      Component                Value               Date                      WBC                      10.2                11/07/2017                HGB                      8.7 (L)             11/07/2017                HCT                      27.8 (L)            11/07/2017                MCV                      79.9                11/07/2017                PLT                      200                 11/07/2017           )        Anesthesia Quick Evaluation

## 2017-11-07 NOTE — Progress Notes (Signed)
Margaret Cain is a 32 y.o. [id number] at 390w6d by ultrasound admitted for rupture of membranes  Subjective: Patient states she is doing much better since epidural. States she can still feel pressure of contractions but can tolerate them more.   Objective: BP (!) 107/59   Pulse 87   Temp 97.8 F (36.6 C) (Axillary)   Resp 18   Ht 5' (1.524 m)   Wt 58.1 kg (128 lb)   LMP 02/01/2017 (Approximate)   SpO2 100%   BMI 25.00 kg/m  No intake/output data recorded. No intake/output data recorded.  FHT:  FHR: 135 bpm, variability: moderate,  accelerations:  Present,  decelerations:  Absent. Some variable decels earlier than were resolved with positional change  UC:   regular, every 3-4 minutes SVE:   Dilation: 9 Effacement (%): 100 Station: +2 Exam by:: Darin EngelsAbraham, MD  Labs: Lab Results  Component Value Date   WBC 10.2 11/07/2017   HGB 8.7 (L) 11/07/2017   HCT 27.8 (L) 11/07/2017   MCV 79.9 11/07/2017   PLT 200 11/07/2017    Assessment / Plan: Spontaneous labor, progressing normally  Labor: Progressing normally Fetal Wellbeing:  Category I Pain Control:  Epidural I/D:  Ancef Anticipated MOD:  NSVD  Margaret Cain 11/07/2017, 5:05 AM

## 2017-11-07 NOTE — Anesthesia Postprocedure Evaluation (Signed)
Anesthesia Post Note  Patient: Rosalie GumsMeghan M Borski  Procedure(s) Performed: AN AD HOC LABOR EPIDURAL     Patient location during evaluation: Mother Baby Anesthesia Type: Epidural Level of consciousness: awake Pain management: satisfactory to patient Vital Signs Assessment: post-procedure vital signs reviewed and stable Respiratory status: spontaneous breathing Cardiovascular status: stable Anesthetic complications: no    Last Vitals:  Vitals:   11/07/17 0850 11/07/17 0950  BP: (!) 129/54 125/60  Pulse: 74 88  Resp: 16 16  Temp: 37.1 C 36.5 C  SpO2:      Last Pain:  Vitals:   11/07/17 1234  TempSrc:   PainSc: 7    Pain Goal:                 KeyCorpBURGER,Yazir Koerber

## 2017-11-07 NOTE — Anesthesia Procedure Notes (Signed)
Epidural Patient location during procedure: OB Start time: 11/07/2017 1:26 AM End time: 11/07/2017 1:33 AM  Staffing Anesthesiologist: Shelton SilvasHollis, Huan Pollok D, MD Performed: anesthesiologist   Preanesthetic Checklist Completed: patient identified, site marked, surgical consent, pre-op evaluation, timeout performed, IV checked, risks and benefits discussed and monitors and equipment checked  Epidural Patient position: sitting Prep: ChloraPrep Patient monitoring: heart rate, continuous pulse ox and blood pressure Approach: midline Location: L3-L4 Injection technique: LOR saline  Needle:  Needle type: Tuohy  Needle gauge: 17 G Needle length: 9 cm Catheter type: closed end flexible Catheter size: 20 Guage Test dose: negative and 1.5% lidocaine  Assessment Events: blood not aspirated, injection not painful, no injection resistance and no paresthesia  Additional Notes LOR @ 4  Patient identified. Risks/Benefits/Options discussed with patient including but not limited to bleeding, infection, nerve damage, paralysis, failed block, incomplete pain control, headache, blood pressure changes, nausea, vomiting, reactions to medications, itching and postpartum back pain. Confirmed with bedside nurse the patient's most recent platelet count. Confirmed with patient that they are not currently taking any anticoagulation, have any bleeding history or any family history of bleeding disorders. Patient expressed understanding and wished to proceed. All questions were answered. Sterile technique was used throughout the entire procedure. Please see nursing notes for vital signs. Test dose was given through epidural catheter and negative prior to continuing to dose epidural or start infusion. Warning signs of high block given to the patient including shortness of breath, tingling/numbness in hands, complete motor block, or any concerning symptoms with instructions to call for help. Patient was given instructions on  fall risk and not to get out of bed. All questions and concerns addressed with instructions to call with any issues or inadequate analgesia.    Reason for block:procedure for pain

## 2017-11-07 NOTE — H&P (Signed)
LABOR AND DELIVERY ADMISSION HISTORY AND PHYSICAL NOTE  Margaret Cain is a 32 y.o. female [phone] with IUP at [redacted]w[redacted]d by ultrasound presenting for PROM ~0000 and early labor. Patient initially came into MAU via EMS due to increased contractions at home. While in MAU patient was being monitored as not in active labor, when membranes spontaneously ruptured.   She reports positive fetal movement. She denies vaginal bleeding.  Prenatal History/Complications: PNC at [facility] Pregnancy complications:  - h/o preterm labor received 17P - ASCUS with positive high risk HPV - will need pp pap - anemia - GBS bacteriuria  - h/o gestational diabetes  - migraine headache  - does not have custody of other children  Does not have custody of other children. Anticipates that her family may take custody of this baby if the pt cannot. Clinic [facility] Prenatal Labs  Dating U/S 03/16/17 Blood type: O/Positive/-- (06/21 0000)   Genetic Screen  First trimester screen: Nml AFP: Nml Antibody:Negative (06/21 0000)  Anatomic [place] Inadequate views of heart Rubella: Immune (06/21 0000)  GTT Early:    normal           Third trimester: normal RPR: Nonreactive (06/21 0000)   Flu vaccine 07/25/17 HBsAg: Negative (06/21 0000)   TDaP vaccine   08/20/17                                            Rhogam: NA HIV: Non-reactive (06/21 0000)   Baby Food  Bottle                                             GBS:  positive in urine  Contraception Depo provera Pap: SCUS insufficient to run HPV, [ ]  needs PP pap  Circumcision  yes if a boy   Pediatrician  [facility]   Support Person Myrtha Mantis (bf)   Prenatal Classes  N/A     Sono: @[redacted]w[redacted]d , CWD, normal anatomy, cephalic presentation, placenta anterior above cervical os, 1097g, 72% EFW  Past Medical History: Past Medical History:  Diagnosis Date  . Anemia   . Anxiety   . Depression    ppd with first child  . Gestational diabetes    diet controlled  .  Headache(784.0)   . Hx of chlamydia infection   . Positive GBS test 03/18/2017  . Pregnancy induced hypertension   . Preterm labor   . Trichomonosis   . Urinary tract infection   . Vaginal Pap smear, abnormal    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    Past Surgical History: Past Surgical History:  Procedure Laterality Date  . COLPOSCOPY      Obstetrical History: OB History    Gravida Para Term Preterm AB Living   8 6 5 1 1 6    SAB TAB Ectopic Multiple Live Births   1 0 0   6      Social History: Social History   Socioeconomic History  . Marital status: Single    Spouse name: None  . Number of children: None  . Years of education: None  . Highest education level: None  Social Needs  . Financial resource strain: None  . Food insecurity - worry: None  . Food insecurity - inability: None  .  Transportation needs - medical: None  . Transportation needs - non-medical: None  Occupational History  . None  Tobacco Use  . Smoking status: Never Smoker  . Smokeless tobacco: Never Used  Substance and Sexual Activity  . Alcohol use: No  . Drug use: No  . Sexual activity: Yes    Birth control/protection: None    Comment: 1 partner  Other Topics Concern  . None  Social History Narrative   ** Merged History Encounter **        Family History: Family History  Problem Relation Age of Onset  . Asthma Son   . Asthma Paternal Aunt   . Anesthesia problems Neg Hx   . Hypotension Neg Hx   . Malignant hyperthermia Neg Hx   . Pseudochol deficiency Neg Hx   . Other Neg Hx   . Cancer Neg Hx   . Heart disease Neg Hx   . Stroke Neg Hx   . Hearing loss Neg Hx     Allergies: Allergies  Allergen Reactions  . Penicillins Nausea And Vomiting    Able to take IV PCN  . Penicillins Nausea And Vomiting    Has patient had a PCN reaction causing immediate rash, facial/tongue/throat swelling, SOB or lightheadedness with hypotension: No Has patient had a PCN reaction causing severe rash  involving mucus membranes or skin necrosis: No Has patient had a PCN reaction that required hospitalization: No Has patient had a PCN reaction occurring within the last 10 years: No If all of the above answers are "NO", then may proceed with Cephalosporin use.     Medications Prior to Admission  Medication Sig Dispense Refill Last Dose  . acetaminophen (TYLENOL) 500 MG tablet Take 500 mg by mouth every 6 (six) hours as needed for mild pain or headache.   Taking  . cyclobenzaprine (FLEXERIL) 10 MG tablet Take 1 tablet (10 mg total) by mouth 3 (three) times daily as needed for up to 90 doses for muscle spasms. 90 tablet 2 Taking  . ferrous sulfate (FERROUSUL) 325 (65 FE) MG tablet Take 1 tablet (325 mg total) by mouth 2 (two) times daily with a meal. (Patient not taking: Reported on 11/05/2017) 60 tablet 1 Not Taking  . NEOMYCIN-POLYMYXIN-HYDROCORTISONE (CORTISPORIN) 1 % SOLN OTIC solution Place 3 drops into the left ear 3 (three) times daily. 10 mL 0 Taking  . Prenatal Vit-Fe Phos-FA-Omega (VITAFOL GUMMIES) 3.33-0.333-34.8 MG CHEW Chew 1 tablet by mouth daily. 90 tablet 6 Taking     Review of Systems  All systems reviewed and negative except as stated in HPI  Physical Exam Blood pressure (!) 145/81, pulse 96, temperature 98 F (36.7 C), temperature source Oral, resp. rate 18, last menstrual period 02/01/2017, SpO2 99 %, unknown if currently breastfeeding. General appearance: alert, oriented, NAD Lungs: normal respiratory effort Heart: regular rate Abdomen: soft, non-tender; gravid, FH appropriate for GA Extremities: No calf swelling or tenderness Presentation: cephalic per cervical exam in MAU Fetal monitoring: FHR 120, moderate variability, +accel, -decel  Uterine activity: irregular  Dilation: 4.5 Effacement (%): 70 Station: -2 Exam by:: A Cioce RN  Prenatal labs: ABO, Rh: --/--/O POS (01/23 0025) Antibody: NEG (01/23 0025) Rubella: Immune (06/21 0000) RPR: Non Reactive (11/05  1420)  HBsAg: Negative (06/21 0000)  HIV: Non Reactive (11/05 1420)  GC/Chlamydia: negative  GBS:   positive  2-hr GTT: 73/81/68 - normal Genetic screening:  1st trimester screen nml, AFP nml  Anatomy US: inadequate view of heart, heart appears normal  on US from 26 weeks   Prenatal Transfer Tool  Maternal Diabetes: No Genetic Screening: Normal Maternal Ultrasounds/Referrals: Normal  Fetal Ultrasounds or other Referrals:  None Maternal Substance Abuse:  No Significant Maternal Medications:  None Significant Maternal Lab Results: Lab values include: Group B Strep positive  Results for orders placed or performed during the hospital encounter of 11/06/17 (from the past 24 hour(s))  CBC   Collection Time: 11/07/17 12:25 AM  Result Value Ref Range   WBC 10.2 4.0 - 10.5 K/uL   RBC 3.48 (L) 3.87 - 5.11 MIL/uL   Hemoglobin 8.7 (L) 12.0 - 15.0 g/dL   HCT 16.127.8 (L) 09.636.0 - 04.546.0 %   MCV 79.9 78.0 - 100.0 fL   MCH 25.0 (L) 26.0 - 34.0 pg   MCHC 31.3 30.0 - 36.0 g/dL   RDW 40.914.7 81.111.5 - 91.415.5 %   Platelets 200 150 - 400 K/uL  Type and screen [facility]   Collection Time: 11/07/17 12:25 AM  Result Value Ref Range   ABO/RH(D) O POS    Antibody Screen NEG    Sample Expiration 11/10/2017     Patient Active Problem List   Diagnosis Date Noted  . PROM (premature rupture of membranes) 11/07/2017  . Unwanted fertility 08/20/2017  . Round ligament pain 06/12/2017  . Back pain affecting pregnancy 06/12/2017  . History of gestational diabetes 06/08/2017  . Migraine headache 06/08/2017  . Supervision of high risk pregnancy due to social problems, antepartum 04/09/2017  . GBS bacteriuria 03/18/2017  . Anemia affecting pregnancy, antepartum 02/17/2016  . ASCUS with positive high risk HPV cervical 10/21/2015  . Pica 10/19/2015  . History of preterm delivery 10/19/2015  . Smoker 12/10/2014    Assessment: Margaret Cain is a 32 y.o. [id number] at 5971w6d here for PROM.  #Labor:  Early labor. Anticipate NSVD, expectant management. Can consider augmentation with pitocin if no cervical change. #Pain: Per patient request, will plan for epidural  #FWB: Category 1  #ID: GBS positive. Allergic to PCN, will start ancef given sensitivity results. Patient reports recent Keflex use with no allergic reaction.  #MOF: bottle  #MOC:depo provera  #Circ:  N/A  Will have CSW consult placed due to no custody of children.  Oralia ManisSherin Abraham, DO 11/07/2017, 1:46 AM  OB FELLOW HISTORY AND PHYSICAL ATTESTATION  I confirm that I have verified the information documented in the resident's note and that I have also personally reperformed the physical exam and all medical decision making activities. I agree with above documentation and have made edits as needed.   Caryl AdaJazma Alexia Dinger, DO

## 2017-11-08 ENCOUNTER — Encounter (HOSPITAL_COMMUNITY): Payer: Self-pay | Admitting: *Deleted

## 2017-11-08 ENCOUNTER — Inpatient Hospital Stay (HOSPITAL_COMMUNITY)
Admission: RE | Admit: 2017-11-08 | Payer: Medicaid Other | Source: Ambulatory Visit | Admitting: Obstetrics and Gynecology

## 2017-11-08 MED ORDER — OXYCODONE-ACETAMINOPHEN 5-325 MG PO TABS: 1.0000 | ORAL_TABLET | Freq: Four times a day (QID) | ORAL | Status: DC | PRN

## 2017-11-08 NOTE — Plan of Care (Signed)
Pt. Will be knowledable of baby care

## 2017-11-08 NOTE — Progress Notes (Signed)
POSTPARTUM PROGRESS NOTE  Post Partum Day 1  Subjective:  Margaret Cain is a 32 y.o. [id number] s/p SVD at 2242w6d.  No acute events overnight.  Pt denies problems with ambulating, voiding or po intake.  She reports some nausea but no vomiting.  Pain is poorly controlled; reports that she would like percocet.  She has had flatus.  Lochia Minimal.   Objective: Blood pressure 107/60, pulse 80, temperature 98.4 F (36.9 C), resp. rate 18, height 5' (1.524 m), weight 56.2 kg (124 lb), last menstrual period 02/01/2017, SpO2 100 %, unknown if currently breastfeeding.  Physical Exam:  General: alert, cooperative and no distress Chest: no respiratory distress Abdomen: soft, nontender Uterine Fundus: firm, appropriately tender DVT Evaluation: No calf swelling or tenderness Extremities: No edema Skin: warm, dry  Recent Labs    11/07/17 0025  HGB 8.7*  HCT 27.8*    Assessment/Plan: Margaret Cain is a 32 y.o. [id number] s/p SVD at 6442w6d   PPD#1 - Pain is poorly controlled with ibuprofen, consider adding additional pain medication Contraception: Depo Feeding: bottle Dispo: Plan for discharge tomorrow.   LOS: 1 day   Mikal PlaneBenjamin A Tynia Wiers MS3 11/08/2017, 6:59 AM

## 2017-11-08 NOTE — Progress Notes (Signed)
Nurse called in. Patient concerned about what "looks like broken blood vessels in her eye."  Explained that can happen during the birthing, from the pressure getting through birth canal, and is usually a normal finding. Encouraged patient to ask pediatrician in a.m. If still concerned. Patient thanked nurse for explanation.
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
## 2017-11-08 NOTE — Progress Notes (Signed)
CSW awaiting notification from CPS Intake worker as to who has been assigned to this case.  Parents are aware that baby will not be able to discharge until CPS has spoken with them and safe disposition plan has been made.  CSW to follow closely.

## 2017-11-09 MED ORDER — IBUPROFEN 600 MG PO TABS: 600.0000 mg | ORAL_TABLET | Freq: Four times a day (QID) | ORAL | 0 refills | Status: DC

## 2017-11-09 NOTE — Progress Notes (Signed)
CSW left message for Jasmine Perry/CPS worker to request a call as soon as possible regarding plan for baby's disposition today.  CSW spoke with MOB to inform her of her CPS worker's name and that CSW has left a message to request follow up.  MOB calm and appropriate and thanked CSW for the information. 

## 2017-11-09 NOTE — Discharge Summary (Signed)
OB Discharge Summary     Patient Name: Margaret GumsMeghan M Orozco DOB: 03/16/1986 MRN: [id number]  Date of admission: 11/06/2017 Delivering MD: Oralia ManisABRAHAM, SHERIN   Date of discharge: 11/09/2017  Admitting diagnosis: 39wks CTX Intrauterine pregnancy: 6973w6d     Secondary diagnosis:  Principal Problem:   Vaginal delivery Active Problems:   PROM (premature rupture of membranes)      Discharge diagnosis: Term Pregnancy Delivered                                                                                                Post partum procedures:n/a  Augmentation: n/a  Complications: None  Hospital course:  Onset of Labor With Vaginal Delivery     32 y.o. yo [id number] at 3173w6d was admitted in Active Labor on 11/06/2017. Patient had an uncomplicated labor course as follows:  Membrane Rupture Time/Date: 12:15 AM ,11/07/2017   Intrapartum Procedures: Episiotomy: None [1]                                         Lacerations:  None [1]  Patient had a delivery of a Viable infant. 11/07/2017  Information for the patient's newborn:  Lamar BlinksKimber, Girl Collin [[id number]][[id number]]  Delivery Method: Vaginal, Spontaneous(Filed from Delivery Summary)    Pateint had an uncomplicated postpartum course.  She is ambulating, tolerating a regular diet, passing flatus, and urinating well. Patient is discharged home in stable condition on 11/09/17.   Physical exam  Vitals:   11/08/17 0001 11/08/17 0600 11/08/17 1912 11/09/17 0641  BP: (!) 116/54 107/60 109/75 106/61  Pulse:  80 90 72  Resp:  18 12 16   Temp:  98.4 F (36.9 C) 98.3 F (36.8 C) 98.6 F (37 C)  TempSrc:   Axillary Axillary  SpO2:  100%  100%  Weight:  56.2 kg (124 lb)  55.7 kg (122 lb 12.8 oz)  Height:       General: alert, cooperative and no distress Lochia: appropriate Uterine Fundus: firm Incision: N/A DVT Evaluation: No evidence of DVT seen on physical exam. No significant calf/ankle edema. Labs: Lab Results  Component Value Date   WBC 10.2  11/07/2017   HGB 8.7 (L) 11/07/2017   HCT 27.8 (L) 11/07/2017   MCV 79.9 11/07/2017   PLT 200 11/07/2017   CMP Latest Ref Rng & Units 07/25/2017  Glucose 65 - 99 mg/dL 130(Q100(H)  BUN 6 - 20 mg/dL 3(L)  Creatinine 6.570.44 - 1.00 mg/dL 8.46(N0.40(L)  Sodium 629135 - 528145 mmol/L 139  Potassium 3.5 - 5.1 mmol/L 3.3(L)  Chloride 101 - 111 mmol/L 101  CO2 22 - 32 mmol/L -  Calcium 8.9 - 10.3 mg/dL -  Total Protein 6.5 - 8.1 g/dL -  Total Bilirubin 0.3 - 1.2 mg/dL -  Alkaline Phos 38 - 413126 U/L -  AST 15 - 41 U/L -  ALT 14 - 54 U/L -    Discharge instruction: per After Visit Summary and "Baby and Me Booklet".  After visit meds:  Allergies as  of 11/09/2017      Reactions   Penicillins Nausea And Vomiting   Has patient had a PCN reaction causing immediate rash, facial/tongue/throat swelling, SOB or lightheadedness with hypotension: No Has patient had a PCN reaction causing severe rash involving mucus membranes or skin necrosis: No Has patient had a PCN reaction that required hospitalization: No Has patient had a PCN reaction occurring within the last 10 years: No If all of the above answers are "NO", then may proceed with Cephalosporin use.      Medication List    STOP taking these medications   VITAFOL GUMMIES 3.33-0.333-34.8 MG Chew     TAKE these medications   ibuprofen 600 MG tablet Commonly known as:  ADVIL,MOTRIN Take 1 tablet (600 mg total) by mouth every 6 (six) hours.       Diet: routine diet  Activity: Advance as tolerated. Pelvic rest for 6 weeks.   Outpatient follow up:4 weeks Follow up Appt:No future appointments. Follow up Visit:No Follow-up on file.  Postpartum contraception: Depo Provera  Newborn Data: Live born female  Birth Weight: 8 lb 0.8 oz (3650 g) APGAR: 8, 10  Newborn Delivery   Birth date/time:  11/07/2017 07:01:00 Delivery type:  Vaginal, Spontaneous     Baby Feeding: Bottle Disposition:Babdy dispo pending CPS consult   11/09/2017 Alroy Bailiff, MD  OB FELLOW DISCHARGE ATTESTATION  I have seen and examined this patient and agree with above documentation in the resident's note.   Frederik Pear, MD OB Fellow 3:01 PM

## 2017-11-12 ENCOUNTER — Encounter: Payer: Medicaid Other | Admitting: Family Medicine

## 2017-11-12 NOTE — Progress Notes (Signed)
CSW spoke with CPS worker Kizzie FurnishJ. Perry who states plans to meet with parents at home once they are discharged.  CSW requests that Ms. Marina Goodellerry initiate case with parents at the hospital prior to baby's discharge and states we will work with her schedule as far as what time discharge will take place today.  She reports that she has not had the opportunity to speak with the foster care social worker yet and has a meeting at 11am today.  CSW requests a call from CPS worker once she has a chance to speak with foster care worker and meet with parents at the hospital and explained that we will not write a discharge on baby until this is completed.  CPS worker thanked CSW.  CSW notified parents and staff.

## 2017-11-12 NOTE — Progress Notes (Signed)
CSW received call from Ms. Perry/CPS worker stating that she has met with parents and family and states no barriers to discharge baby to the care of MOB and FOB at this time.  She plans to meet the family at the home for the first home visit at 4:30pm today.  CSW thanked CPS worker and notified staff.  CSW met with family who were appreciative of CSW's support.  [facility] will not accept MOB at this time as she has been in their program in the past.  CSW made referral to [facility] for added community support. 

## 2017-12-17 ENCOUNTER — Encounter: Payer: Self-pay | Admitting: Family Medicine

## 2017-12-17 ENCOUNTER — Encounter: Payer: Self-pay | Admitting: General Practice

## 2017-12-17 ENCOUNTER — Ambulatory Visit (INDEPENDENT_AMBULATORY_CARE_PROVIDER_SITE_OTHER): Payer: Medicaid Other | Admitting: Family Medicine

## 2017-12-17 DIAGNOSIS — Z3042 Encounter for surveillance of injectable contraceptive: Secondary | ICD-10-CM | POA: Diagnosis not present

## 2017-12-17 DIAGNOSIS — M545 Low back pain, unspecified: Secondary | ICD-10-CM

## 2017-12-17 DIAGNOSIS — M9903 Segmental and somatic dysfunction of lumbar region: Secondary | ICD-10-CM | POA: Diagnosis not present

## 2017-12-17 LAB — POCT PREGNANCY, URINE: Preg Test, Ur: NEGATIVE

## 2017-12-17 MED ORDER — BACLOFEN 10 MG PO TABS: 10.0000 mg | ORAL_TABLET | Freq: Three times a day (TID) | ORAL | 0 refills | Status: DC

## 2017-12-17 MED ORDER — MEDROXYPROGESTERONE ACETATE 150 MG/ML IM SUSP
150.0000 mg | Freq: Once | INTRAMUSCULAR | Status: AC
  Administered 2017-12-17: 150 mg via INTRAMUSCULAR

## 2017-12-17 NOTE — Progress Notes (Signed)
Subjective:     Margaret Cain is a 32 y.o. female who presents for a postpartum visit. She is 6 weeks postpartum following a spontaneous vaginal delivery. I have fully reviewed the prenatal and intrapartum course. The delivery was at 39 gestational weeks. Outcome: spontaneous vaginal delivery. Anesthesia: epidural. Postpartum course has been normal. Baby's course has been normal. Baby is feeding by bottle Rush Barer- Gerber statrt. Bleeding none. Bowel function is normal. Bladder function is normal. Patient is sexually active. Contraception method is Depo-Provera injections. Postpartum depression screening: negative.  The following portions of the patient's history were reviewed and updated as appropriate: allergies, current medications, past family history, past medical history, past social history, past surgical history and problem list.  Review of Systems Pertinent items noted in HPI and remainder of comprehensive ROS otherwise negative.   Objective:    LMP 02/01/2017 (Approximate)   General:  alert, cooperative and no distress   Breasts:  inspection negative, no nipple discharge or bleeding, no masses or nodularity palpable  Lungs: clear to auscultation bilaterally  Heart:  regular rate and rhythm, S1, S2 normal, no murmur, click, rub or gallop  Abdomen: soft, non-tender; bowel sounds normal; no masses,  no organomegaly        MSK: Restriction, tenderness, tissue texture changes, and paraspinal spasm in the lumbar spine  Neuro: Moves all four extremities with no focal neurological deficit   OSE: Head   Cervical   Thoracic   Rib   Lumbar L5 ESRR, L3 ESRL  Sacrum L/L torsion  Pelvis R ant innom     Assessment:    Normal postpartum exam. Back pain, Somatic dysfunction lumbar.    Plan:    1. Contraception: Depo-Provera injections 2. OMT done after patient permission. HVLA technique utilized. 3 areas treated with improvement of tissue texture and joint mobility. Patient tolerated  procedure well. Baclofen for spasm  3. Follow up in: 3 months for rpt PAP.

## 2017-12-24 ENCOUNTER — Encounter: Payer: Self-pay | Admitting: *Deleted

## 2017-12-27 ENCOUNTER — Encounter (HOSPITAL_COMMUNITY): Payer: Self-pay

## 2017-12-27 ENCOUNTER — Emergency Department (HOSPITAL_COMMUNITY)
Admission: EM | Admit: 2017-12-27 | Discharge: 2017-12-28 | Disposition: A | Discharge status: Home / Self Care | Payer: Medicaid Other | Attending: Emergency Medicine | Admitting: Emergency Medicine

## 2017-12-27 DIAGNOSIS — S299XXA Unspecified injury of thorax, initial encounter: Secondary | ICD-10-CM | POA: Diagnosis present

## 2017-12-27 DIAGNOSIS — Z79899 Other long term (current) drug therapy: Secondary | ICD-10-CM | POA: Diagnosis not present

## 2017-12-27 DIAGNOSIS — Y9389 Activity, other specified: Secondary | ICD-10-CM | POA: Diagnosis not present

## 2017-12-27 DIAGNOSIS — Y92410 Unspecified street and highway as the place of occurrence of the external cause: Secondary | ICD-10-CM | POA: Insufficient documentation

## 2017-12-27 DIAGNOSIS — Y999 Unspecified external cause status: Secondary | ICD-10-CM | POA: Diagnosis not present

## 2017-12-27 DIAGNOSIS — S20219A Contusion of unspecified front wall of thorax, initial encounter: Secondary | ICD-10-CM | POA: Diagnosis not present

## 2017-12-27 MED ORDER — IBUPROFEN 200 MG PO TABS
600.0000 mg | ORAL_TABLET | Freq: Once | ORAL | Status: AC
  Administered 2017-12-27: 23:00:00 600 mg via ORAL
  Filled 2017-12-27: qty 1

## 2017-12-27 MED ORDER — CYCLOBENZAPRINE HCL 10 MG PO TABS
10.0000 mg | ORAL_TABLET | Freq: Once | ORAL | Status: AC
  Administered 2017-12-27: 10 mg via ORAL
  Filled 2017-12-27: qty 1

## 2017-12-27 NOTE — ED Notes (Signed)
ED Provider at bedside. 
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
## 2017-12-28 ENCOUNTER — Emergency Department (HOSPITAL_COMMUNITY): Payer: Medicaid Other

## 2017-12-28 MED ORDER — CYCLOBENZAPRINE HCL 5 MG PO TABS: 5.0000 mg | ORAL_TABLET | Freq: Two times a day (BID) | ORAL | 0 refills | Status: DC | PRN

## 2017-12-28 MED ORDER — NAPROXEN 375 MG PO TABS: 375.0000 mg | ORAL_TABLET | Freq: Two times a day (BID) | ORAL | 0 refills | Status: DC

## 2017-12-28 NOTE — Discharge Instructions (Signed)
Do not drive while taking the muscle relaxer as it will make you sleepy. Follow up with your doctor, return here for worsening symptoms.

## 2017-12-28 NOTE — ED Notes (Signed)
Patient transported to X-ray 

## 2018-01-02 ENCOUNTER — Encounter: Payer: Self-pay | Admitting: Family Medicine

## 2018-01-03 ENCOUNTER — Telehealth: Payer: Self-pay | Admitting: General Practice

## 2018-01-03 NOTE — Telephone Encounter (Signed)
Called patient in regards to [employer] message. Patient states she recently started her period on 3/14 and it has been heavy at times with passing small clots. Asked patient if she received depo at her pp visit and if she ever stopped bleeding after having the baby. Patient states she did stop bleeding and she did receive depo at her visit on 3/4. Discussed with patient it is normal to have irregular bleeding upon initiation of birth control especially depo provera. Told patient as long as she feels okay and isn't experiencing dizziness or shortness of breath, the bleeding will eventually stop. Patient verbalized understanding & states she didn't know if it was related to something still there after delivery. Told patient that isn't likely as she would've had other symptoms before now. Patient verbalized understanding & had no other questions

## 2018-01-17 ENCOUNTER — Encounter: Payer: Self-pay | Admitting: Family Medicine

## 2018-01-22 ENCOUNTER — Ambulatory Visit: Payer: Medicaid Other

## 2018-01-24 ENCOUNTER — Encounter: Payer: Self-pay | Admitting: Family Medicine

## 2018-01-25 ENCOUNTER — Ambulatory Visit (INDEPENDENT_AMBULATORY_CARE_PROVIDER_SITE_OTHER): Payer: Medicaid Other | Admitting: General Practice

## 2018-01-25 ENCOUNTER — Other Ambulatory Visit (HOSPITAL_COMMUNITY)
Admission: RE | Admit: 2018-01-25 | Discharge: 2018-01-25 | Disposition: A | Discharge status: Home / Self Care | Payer: Medicaid Other | Source: Ambulatory Visit | Attending: Obstetrics & Gynecology | Admitting: Obstetrics & Gynecology

## 2018-01-25 DIAGNOSIS — B9689 Other specified bacterial agents as the cause of diseases classified elsewhere: Secondary | ICD-10-CM | POA: Insufficient documentation

## 2018-01-25 DIAGNOSIS — N939 Abnormal uterine and vaginal bleeding, unspecified: Secondary | ICD-10-CM | POA: Insufficient documentation

## 2018-01-25 DIAGNOSIS — N898 Other specified noninflammatory disorders of vagina: Secondary | ICD-10-CM

## 2018-01-25 DIAGNOSIS — N760 Acute vaginitis: Secondary | ICD-10-CM | POA: Diagnosis not present

## 2018-01-25 NOTE — Progress Notes (Signed)
Patient reports off/on brown discharge since receiving Depo. Patient denies itching or odor. Patient also reports continued leaking from breasts on occasion with some pain off and on. States all pregnancy tests at home are negative. Patient instructed in self swab & specimen collected. Discussed results will be in mychart & take 24-48 hours. Also discussed need for annual exam with next Depo injection. Patient verbalized understanding & had no questions

## 2018-01-25 NOTE — Progress Notes (Signed)
I have reviewed the chart and agree with nursing staff's documentation of this patient's encounter.  Vonzella NippleJulie Melesio Madara, PA-C 01/25/2018 12:05 PM

## 2018-01-28 LAB — CERVICOVAGINAL ANCILLARY ONLY
Bacterial vaginitis: POSITIVE — AB
CANDIDA VAGINITIS: NEGATIVE
Chlamydia: NEGATIVE
Neisseria Gonorrhea: NEGATIVE
Trichomonas: NEGATIVE

## 2018-01-30 ENCOUNTER — Other Ambulatory Visit: Payer: Self-pay | Admitting: Medical

## 2018-01-30 DIAGNOSIS — B9689 Other specified bacterial agents as the cause of diseases classified elsewhere: Secondary | ICD-10-CM

## 2018-01-30 DIAGNOSIS — N760 Acute vaginitis: Principal | ICD-10-CM

## 2018-01-30 MED ORDER — METRONIDAZOLE 500 MG PO TABS: 500.0000 mg | ORAL_TABLET | Freq: Two times a day (BID) | ORAL | 0 refills | Status: DC

## 2018-03-05 IMAGING — US US OB COMP LESS 14 WK
1 series · 15 of 28 positions shown · non-contrast
Comparison: No prior scans from this gestation.

CLINICAL DATA: 30-year-old pregnant female presents with pelvic
pain. No reported bleeding. Quantitative beta HCG not available at
the time of this dictation.

EDC by LMP: 11/08/2017, projecting to an expected gestational age of
6 weeks 1 day.
EXAM:
OBSTETRIC <14 WK US AND TRANSVAGINAL OB US
TECHNIQUE: Both transabdominal and transvaginal ultrasound examinations were
performed for complete evaluation of the gestation as well as the
maternal uterus, adnexal regions, and pelvic cul-de-sac.
Transvaginal technique was performed to assess early pregnancy.

[Series 1: us ob comp less 14 wk · 68 acquisitions, 15 frames shown]
[im 1/68]
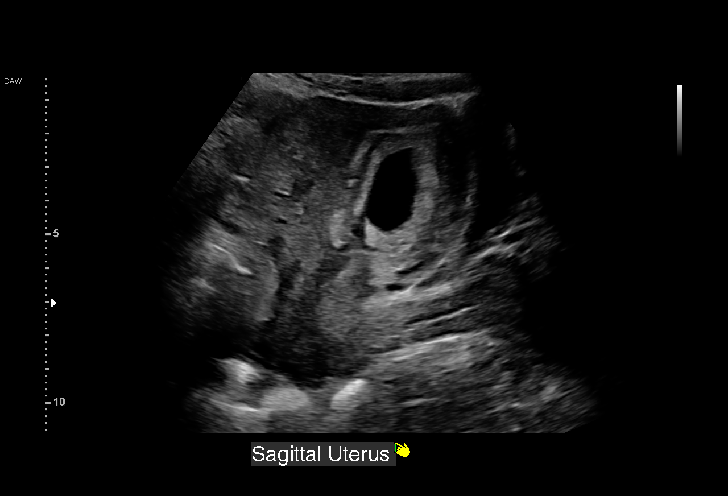
[im 5/68]
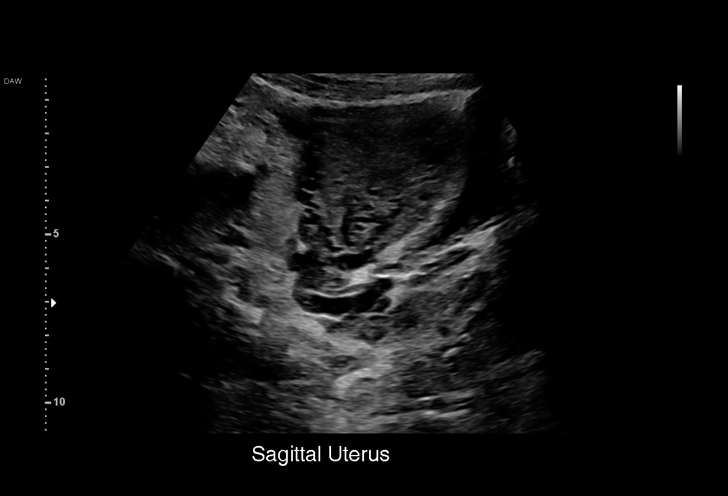
[im 10/68]
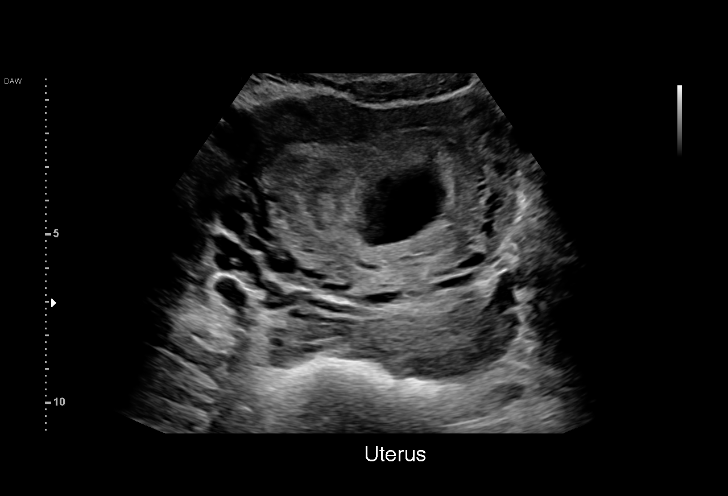
[im 15/68]
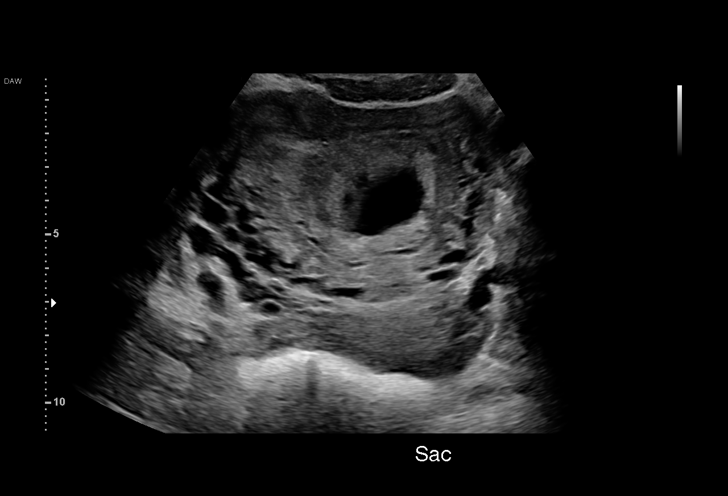
[im 20/68]
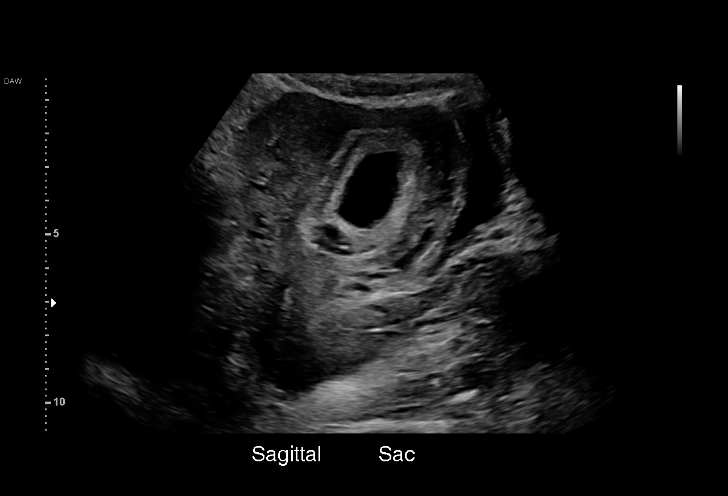
[im 25/68]
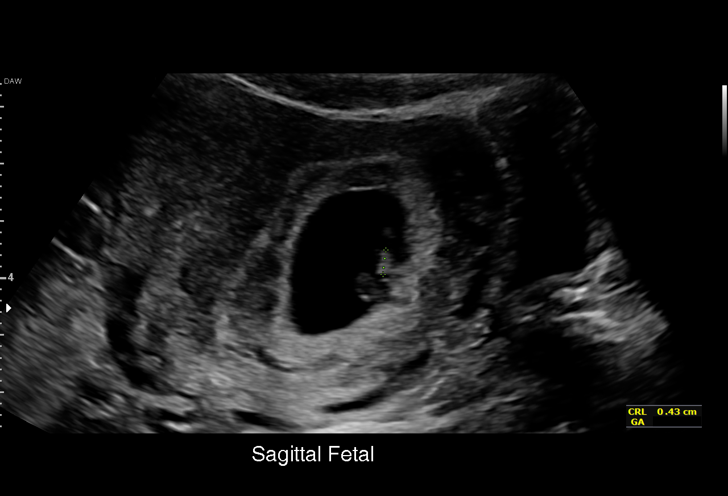
[im 30/68]
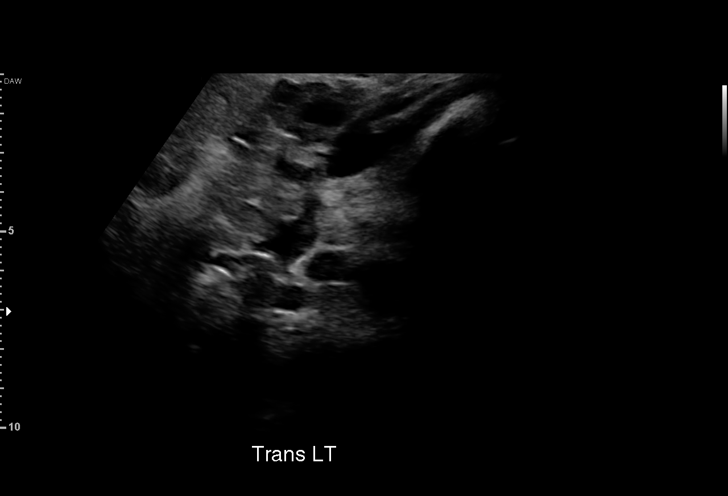
[im 35/68]
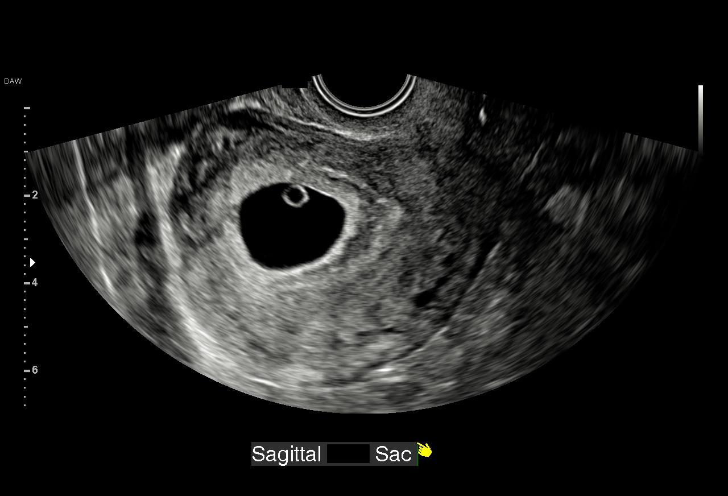
[im 38/68]
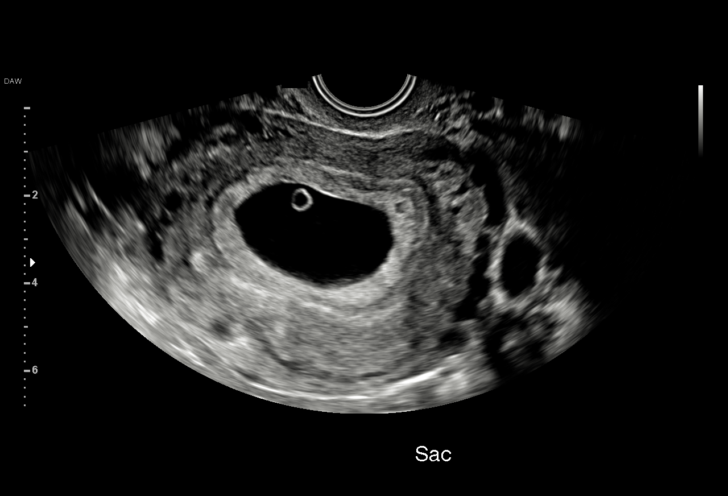
[im 43/68]
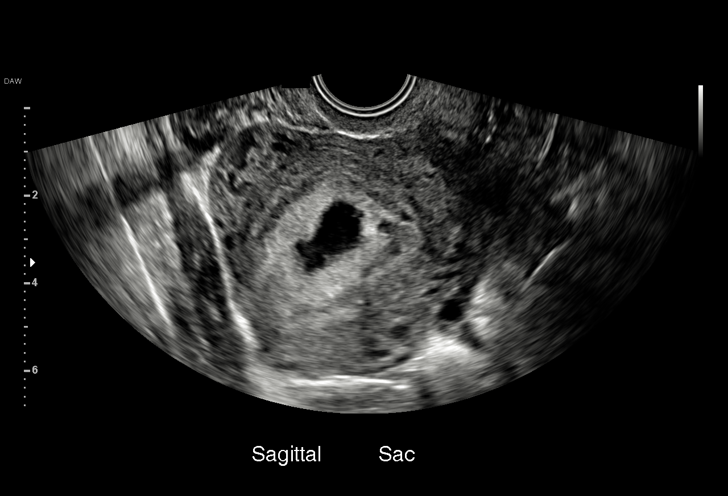
[im 48/68]
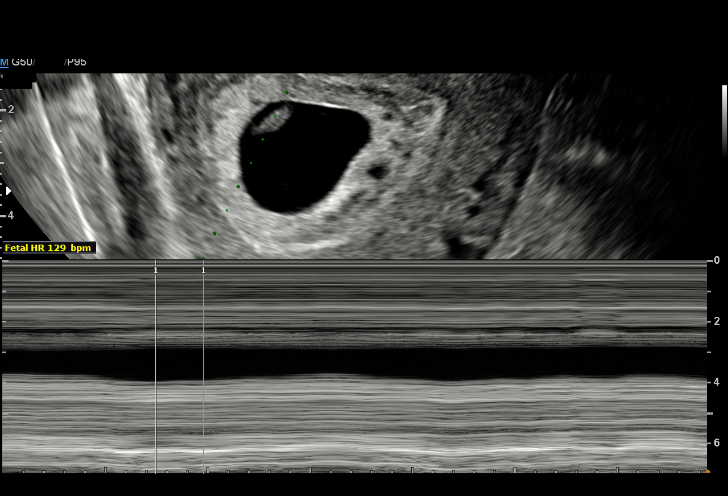
[im 53/68]
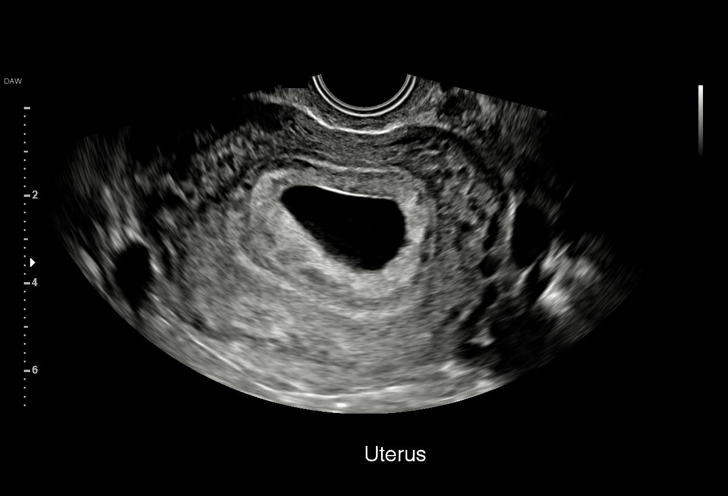
[im 58/68]
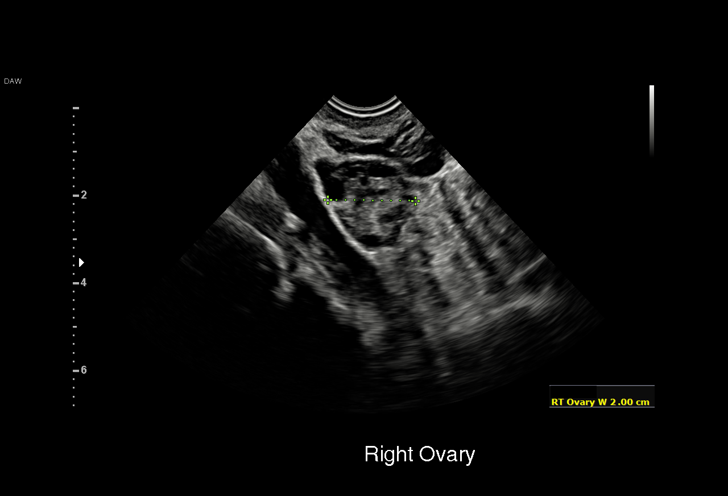
[im 63/68]
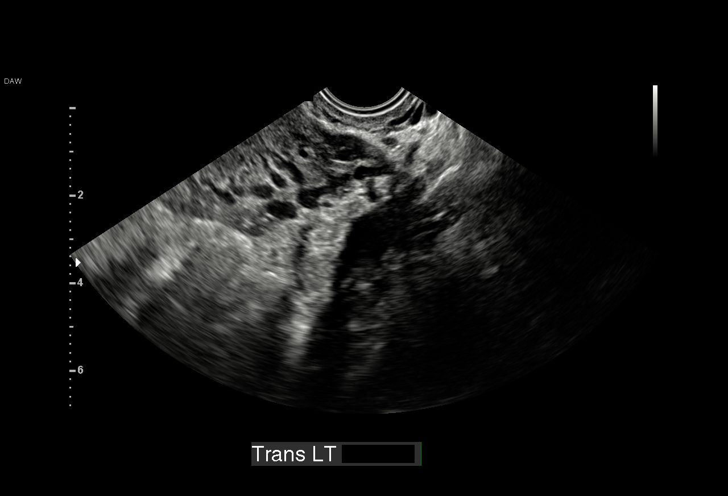
[im 68/68]
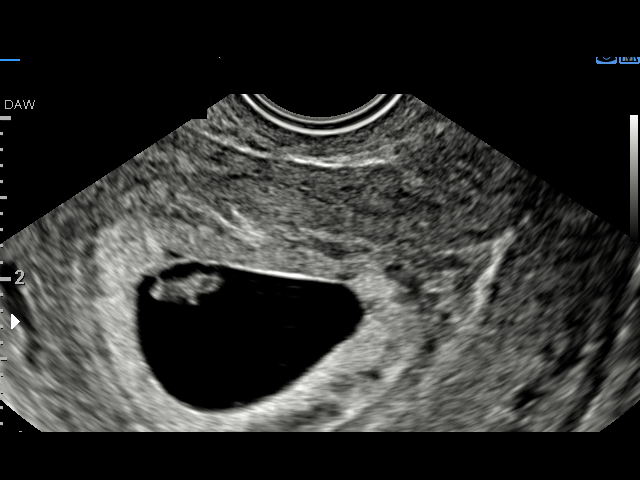

[15 of 28 positions shown; findings below may reference images not displayed]

FINDINGS: Intrauterine gestational sac: Single intrauterine gestational sac
appears normal in size, shape and position.

Yolk sac:  Visualized.

Embryo:  Visualized.

Embryonic Cardiac Activity: Regular rate and rhythm.

Embryonic Heart Rate: 129  bpm

CRL:  7.8  mm   6 w   4 d                  US EDC: 11/05/2017

Subchorionic hemorrhage:  None visualized.

Maternal uterus/adnexae: Right ovary measures 3.7 x 1.6 x 2.0 cm.
Left ovary was not visualized on the transabdominal or transvaginal
images. No abnormal ovarian or adnexal masses are demonstrated. No
free fluid in the pelvis.
IMPRESSION: 1. Single living intrauterine gestation at 6 weeks 4 days by
crown-rump length, with no significant discrepancy with the expected
gestational age of 6 weeks 1 day by provided menstrual dating.
2. No acute early first-trimester gestational abnormality.

## 2018-03-08 ENCOUNTER — Ambulatory Visit: Payer: Medicaid Other

## 2018-03-11 ENCOUNTER — Encounter: Payer: Self-pay | Admitting: Family Medicine

## 2018-03-12 ENCOUNTER — Other Ambulatory Visit: Payer: Self-pay | Admitting: General Practice

## 2018-03-12 MED ORDER — IBUPROFEN 600 MG PO TABS: 600.0000 mg | ORAL_TABLET | Freq: Four times a day (QID) | ORAL | 0 refills | Status: DC

## 2018-03-13 ENCOUNTER — Other Ambulatory Visit: Payer: Self-pay

## 2018-03-13 ENCOUNTER — Ambulatory Visit (HOSPITAL_COMMUNITY)
Admission: EM | Admit: 2018-03-13 | Discharge: 2018-03-13 | Disposition: A | Discharge status: Home / Self Care | Payer: Medicaid Other | Attending: Family Medicine | Admitting: Family Medicine

## 2018-03-13 ENCOUNTER — Encounter (HOSPITAL_COMMUNITY): Payer: Self-pay | Admitting: Emergency Medicine

## 2018-03-13 DIAGNOSIS — N3090 Cystitis, unspecified without hematuria: Secondary | ICD-10-CM | POA: Diagnosis present

## 2018-03-13 LAB — POCT URINALYSIS DIP (DEVICE)
Bilirubin Urine: NEGATIVE
Glucose, UA: NEGATIVE mg/dL
Ketones, ur: 80 mg/dL — AB
NITRITE: POSITIVE — AB
PH: 6 (ref 5.0–8.0)
Protein, ur: 100 mg/dL — AB
Specific Gravity, Urine: 1.025 (ref 1.005–1.030)
Urobilinogen, UA: 4 mg/dL — ABNORMAL HIGH (ref 0.0–1.0)

## 2018-03-13 LAB — POCT PREGNANCY, URINE: Preg Test, Ur: NEGATIVE

## 2018-03-13 MED ORDER — SULFAMETHOXAZOLE-TRIMETHOPRIM 800-160 MG PO TABS: 1.0000 | ORAL_TABLET | Freq: Two times a day (BID) | ORAL | 0 refills | Status: AC

## 2018-03-13 NOTE — ED Provider Notes (Signed)
[facility]    CSN: [id number] Arrival date & time: 03/13/18  1303     History   Chief Complaint Chief Complaint  Patient presents with  . Urinary Tract Infection    HPI Margaret Cain is a 32 y.o. female.   32 year old female comes in for 2 day history of UTI symptoms. Has had dysuria, frequency, low abdominal pain during urination. Denies fever, chills, night sweats. Denies nausea, vomiting.  Denies vaginal discharge, itching, pain.  States that she is also having some vaginal spotting with breast tenderness.  She recently gave birth 86-month ago, and had her first depot injection 2 months ago.  She denies  nipple discharge, skin changes, nipple inversion.  She is not breast-feeding.  Sexually active with one female partner, no condom use.  She has not had a menstrual cycle since pregnancy. Has her GYN appointment tomorrow.      Past Medical History:  Diagnosis Date  . Anemia   . Anxiety   . Depression    ppd with first child  . Gestational diabetes    diet controlled  . Headache(784.0)   . Hx of chlamydia infection   . Positive GBS test 03/18/2017  . Pregnancy induced hypertension   . Preterm labor   . Trichomonosis   . Urinary tract infection   . 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 Migraine headache 06/08/2017  . Anemia affecting pregnancy, antepartum 02/17/2016  . ASCUS with positive high risk HPV cervical 10/21/2015  . Pica 10/19/2015  . Smoker 12/10/2014    Past Surgical History:  Procedure Laterality Date  . COLPOSCOPY      OB History    Gravida  8   Para  7   Term  6   Preterm  1   AB  1   Living  7     SAB  1   TAB  0   Ectopic  0   Multiple  0   Live Births  7            Home Medications    Prior to Admission medications   Medication Sig Start Date End Date Taking? Authorizing Provider  ibuprofen (ADVIL,MOTRIN) 600 MG tablet Take 1  tablet (600 mg total) by mouth every 6 (six) hours. 03/12/18   Rasch, Victorino Dike I, NP  sulfamethoxazole-trimethoprim (BACTRIM DS,SEPTRA DS) 800-160 MG tablet Take 1 tablet by mouth 2 (two) times daily for 3 days. 03/13/18 03/16/18  Belinda Fisher, PA-C    Family History Family History  Problem Relation Age of Onset  . Asthma Son   . Asthma Paternal Aunt   . Anesthesia problems Neg Hx   . Hypotension Neg Hx   . Malignant hyperthermia Neg Hx   . Pseudochol deficiency Neg Hx   . Other Neg Hx   . Cancer Neg Hx   . Heart disease Neg Hx   . Stroke Neg Hx   . Hearing loss Neg Hx     Social History Social History   Tobacco Use  . Smoking status: Never Smoker  . Smokeless tobacco: Never Used  Substance Use Topics  . Alcohol use: No  . Drug use: No     Allergies   Penicillins   Review of Systems Review of Systems  Reason unable to perform ROS: See HPI as above.     Physical Exam  Triage Vital Signs ED Triage Vitals  Enc Vitals Group     BP 03/13/18 1330 117/80     Pulse Rate 03/13/18 1330 75     Resp 03/13/18 1330 18     Temp 03/13/18 1330 98.4 F (36.9 C)     Temp Source 03/13/18 1330 Oral     SpO2 03/13/18 1330 100 %     Weight --      Height --      Head Circumference --      Peak Flow --      Pain Score 03/13/18 1328 7     Pain Loc --      Pain Edu? --      Excl. in GC? --    No data found.  Updated Vital Signs BP 117/80 (BP Location: Left Arm)   Pulse 75   Temp 98.4 F (36.9 C) (Oral)   Resp 18   SpO2 100%   Physical Exam  Constitutional: She is oriented to person, place, and time. She appears well-developed and well-nourished. No distress.  HENT:  Head: Normocephalic and atraumatic.  Eyes: Pupils are equal, round, and reactive to light. Conjunctivae are normal.  Cardiovascular: Normal rate, regular rhythm and normal heart sounds. Exam reveals no gallop and no friction rub.  No murmur heard. Pulmonary/Chest: Effort normal and breath sounds normal. She  has no wheezes. She has no rales. Right breast exhibits no inverted nipple, no mass, no nipple discharge, no skin change and no tenderness. Left breast exhibits no inverted nipple, no mass, no nipple discharge, no skin change and no tenderness.  No obvious breast masses felt.  No tenderness to palpation.  No obvious asymmetry.  No erythema, increased warmth.  Abdominal: Soft. Bowel sounds are normal. She exhibits no mass. There is no tenderness. There is no rebound, no guarding and no CVA tenderness.  Neurological: She is alert and oriented to person, place, and time.  Skin: Skin is warm and dry.  Psychiatric: She has a normal mood and affect. Her behavior is normal. Judgment normal.     UC Treatments / Results  Labs (all labs ordered are listed, but only abnormal results are displayed) Labs Reviewed  POCT URINALYSIS DIP (DEVICE) - Abnormal; Notable for the following components:      Result Value   Ketones, ur 80 (*)    Hgb urine dipstick TRACE (*)    Protein, ur 100 (*)    Urobilinogen, UA 4.0 (*)    Nitrite POSITIVE (*)    Leukocytes, UA SMALL (*)    All other components within normal limits  URINE CULTURE  POCT PREGNANCY, URINE    EKG None  Radiology No results found.  Procedures Procedures (including critical care time)  Medications Ordered in UC Medications - No data to display  Initial Impression / Assessment and Plan / UC Course  I have reviewed the triage vital signs and the nursing notes.  Pertinent labs & imaging results that were available during my care of the patient were reviewed by me and considered in my medical decision making (see chart for details).    Urine dipstick positive for UTI. Start antibiotics as directed. Push fluids. Return precautions given.  Without signs of mastitis on exam.  Patient with breast tenderness that is intermittent, recently started Depot injection.  She has an appointment with GYN tomorrow, will have patient follow-up as  scheduled for further evaluation and management of breast tenderness and vaginal spotting.  Return precautions given.  Patient expresses understanding and agrees to plan.  Final Clinical Impressions(s) / UC Diagnoses   Final diagnoses:  Cystitis    ED Prescriptions    Medication Sig Dispense Auth. Provider   sulfamethoxazole-trimethoprim (BACTRIM DS,SEPTRA DS) 800-160 MG tablet Take 1 tablet by mouth 2 (two) times daily for 3 days. 6 tablet Threasa Alpha, [place] 03/13/18 1410

## 2018-03-13 NOTE — Discharge Instructions (Signed)
Your urine was positive for an urinary tract infection. Start bactrim as directed. Keep hydrated, your urine should be clear to pale yellow in color. Monitor for any worsening of symptoms, fever, worsening abdominal pain, nausea/vomiting, flank pain, follow up for reevaluation.   As discussed, vaginal spotting and breast tenderness could be due to recent pregnancy/start of depot injection. No signs of infection. Follow up with GYN as scheduled tomorrow for further evaluation and management needed.

## 2018-03-13 NOTE — ED Triage Notes (Signed)
Patient thinks she is having uti symptoms.  Low abdominal spasm after urinating and pain at end of urinary stream Started depo shot march 4.  Complaining of spotting, and breast pain.

## 2018-03-14 ENCOUNTER — Ambulatory Visit: Payer: Medicaid Other | Admitting: Family Medicine

## 2018-03-15 ENCOUNTER — Encounter: Payer: Self-pay | Admitting: Family Medicine

## 2018-03-15 ENCOUNTER — Telehealth (HOSPITAL_COMMUNITY): Payer: Self-pay

## 2018-03-15 LAB — URINE CULTURE: Culture: >=100000 — AB

## 2018-03-15 NOTE — Telephone Encounter (Signed)
Urine culture positive for E.coli, this was treated at urgent care visit with Bactrim. Attempted to reach patient. No answer at this time. Voicemail left.  Message sent via MyChart.

## 2018-03-18 ENCOUNTER — Encounter: Payer: Self-pay | Admitting: Family Medicine

## 2018-03-19 ENCOUNTER — Encounter: Payer: Self-pay | Admitting: Family Medicine

## 2018-03-19 ENCOUNTER — Telehealth (HOSPITAL_COMMUNITY): Payer: Self-pay | Admitting: Physician Assistant

## 2018-03-19 MED ORDER — SULFAMETHOXAZOLE-TRIMETHOPRIM 800-160 MG PO TABS: 1.0000 | ORAL_TABLET | Freq: Two times a day (BID) | ORAL | 0 refills | Status: AC

## 2018-03-19 NOTE — Telephone Encounter (Signed)
PT never filled her bactrim last week. PT requests that it be sent to [facility] [address] Linward HeadlandAmy Yu, PA approves change. Medication amount to remain the same.

## 2018-05-07 ENCOUNTER — Ambulatory Visit: Payer: Medicaid Other | Admitting: Advanced Practice Midwife

## 2018-05-28 IMAGING — US US MFM OB DETAIL+14 WK
1 series · 14 of 28 positions shown · non-contrast
Comparison: none

[Series 1: us mfm ob detail+14 wk · 82 acquisitions, 14 frames shown]
[im 4/82]
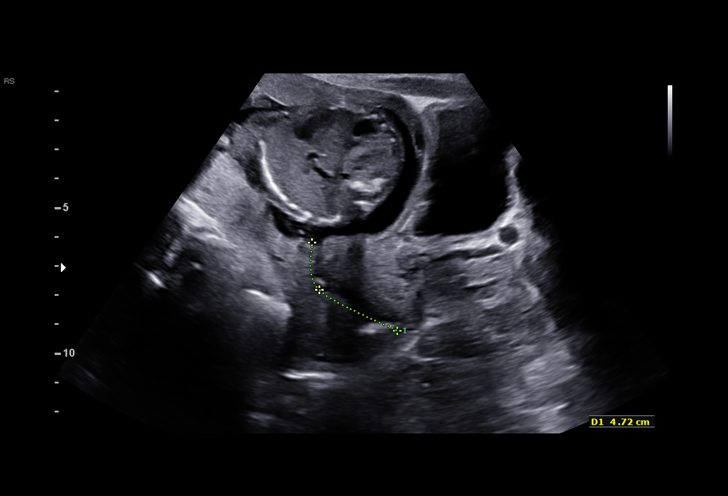
[im 10/82]
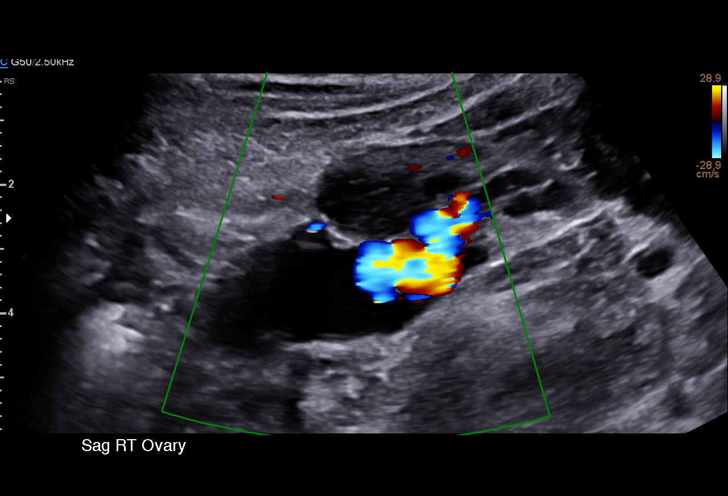
[im 16/82]
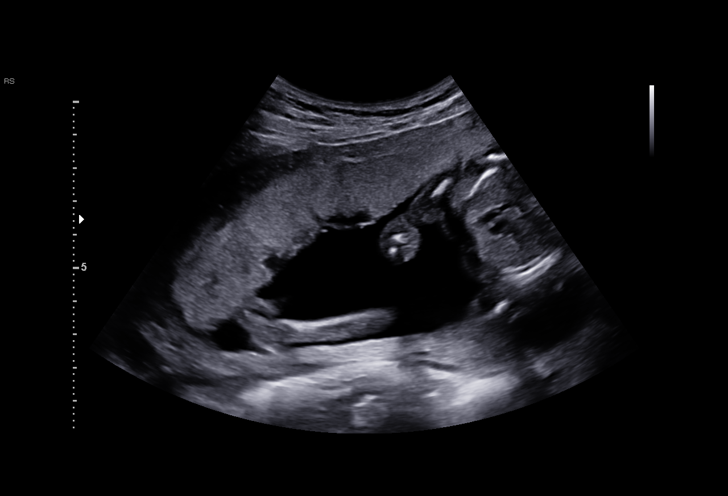
[im 22/82]
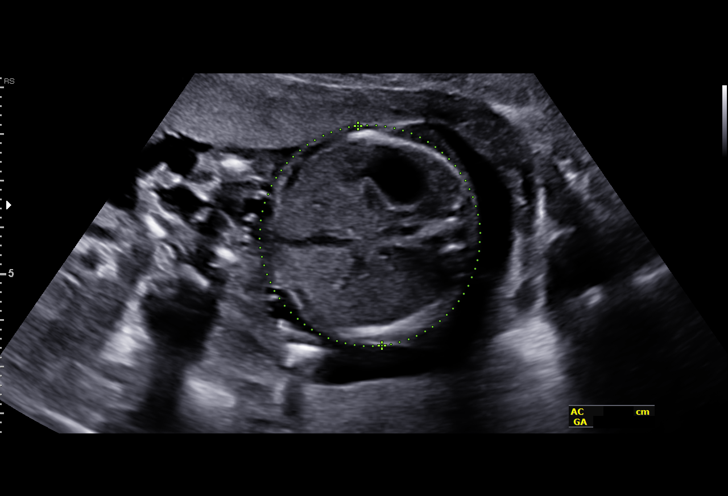
[im 28/82]
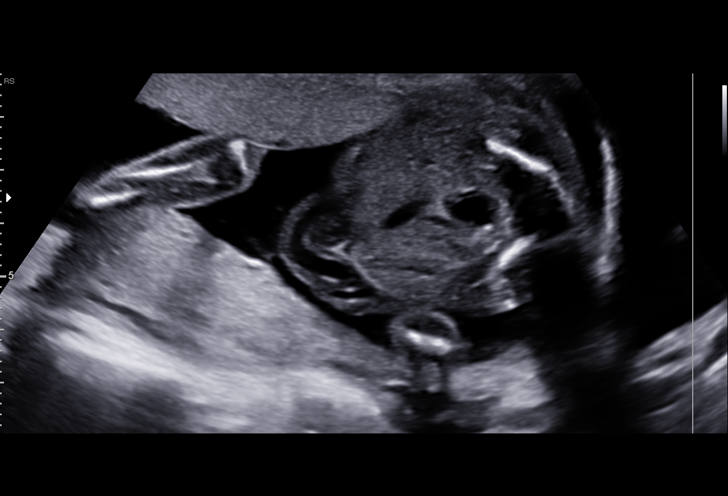
[im 34/82]
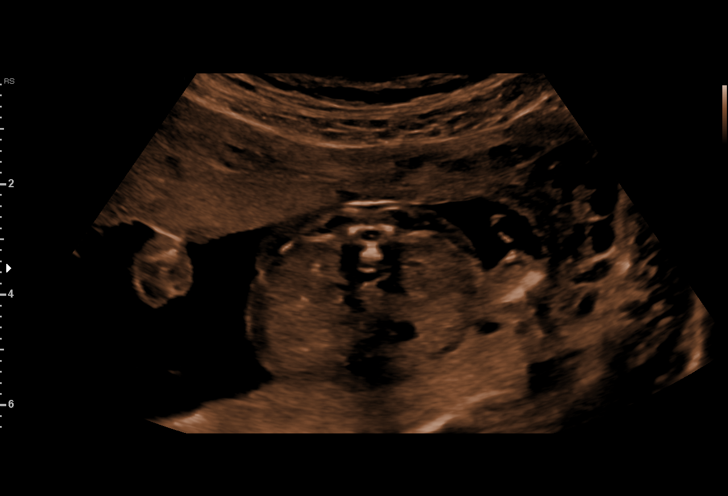
[im 40/82]
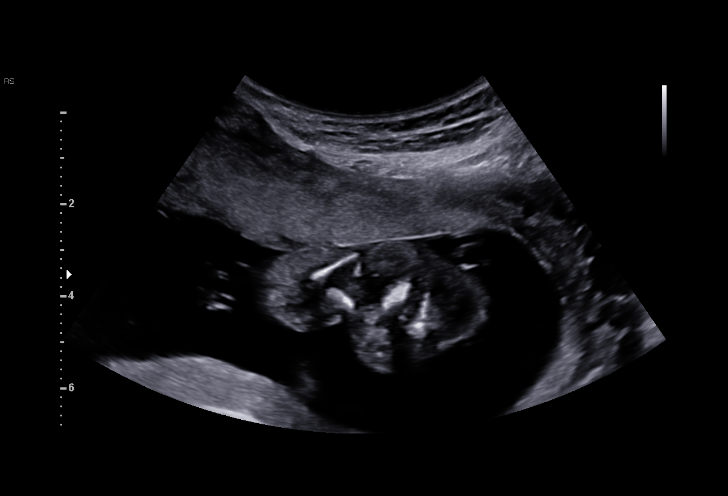
[im 46/82]
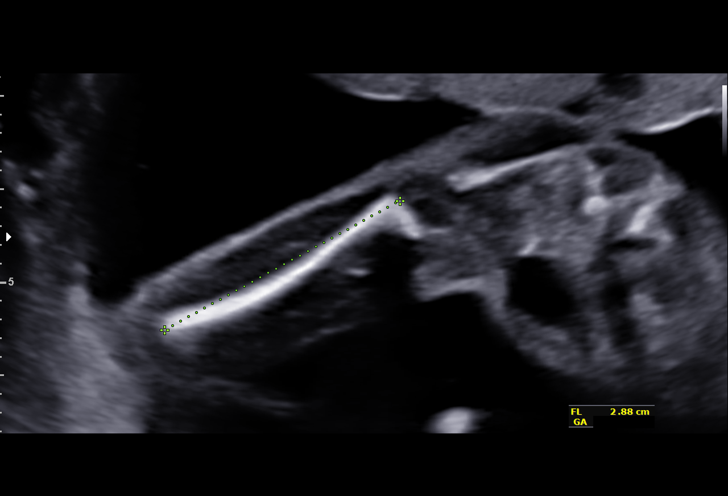
[im 52/82]
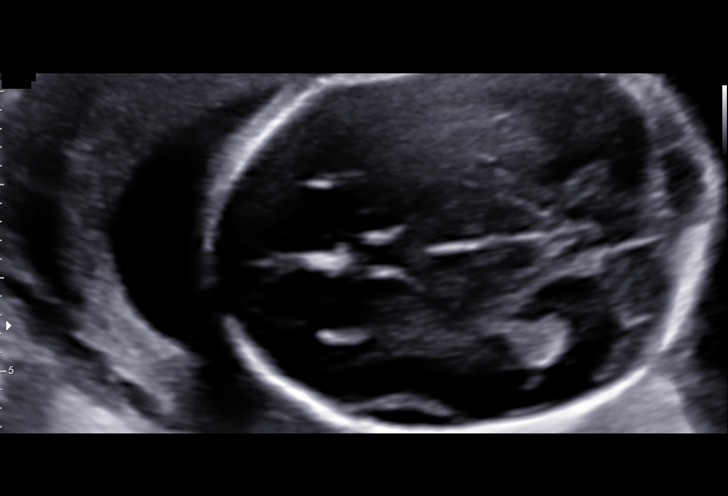
[im 58/82]
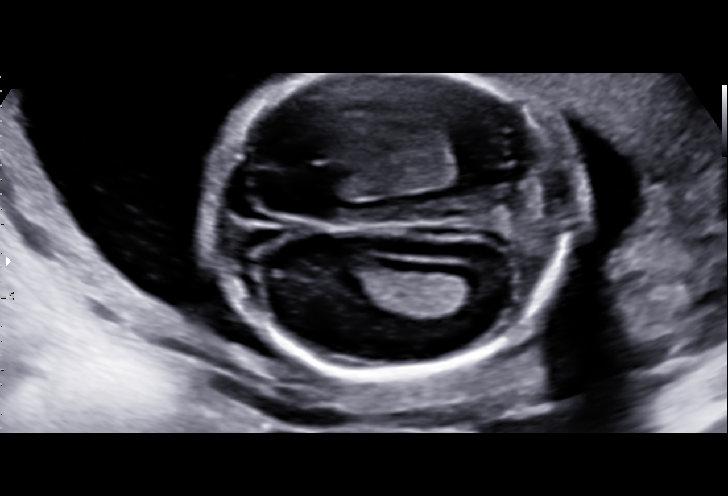
[im 64/82]
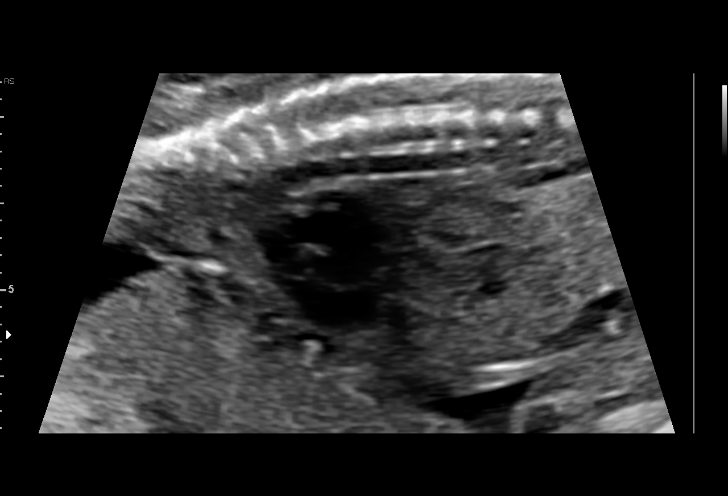
[im 70/82]
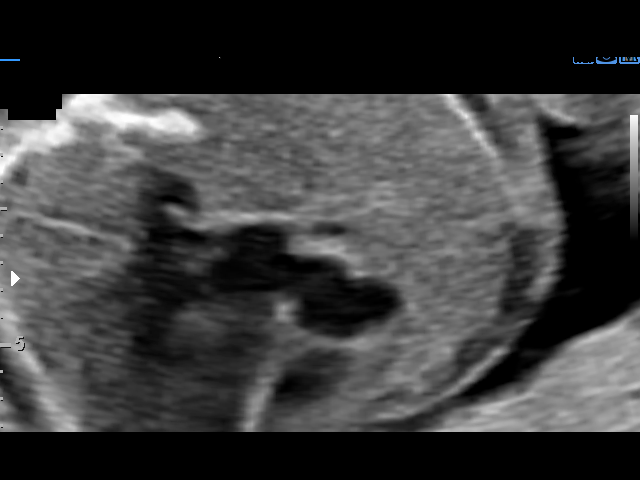
[im 76/82]
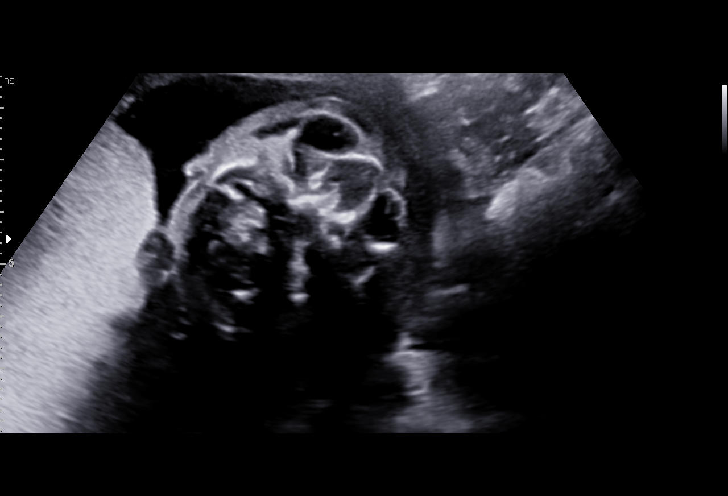
[im 82/82]
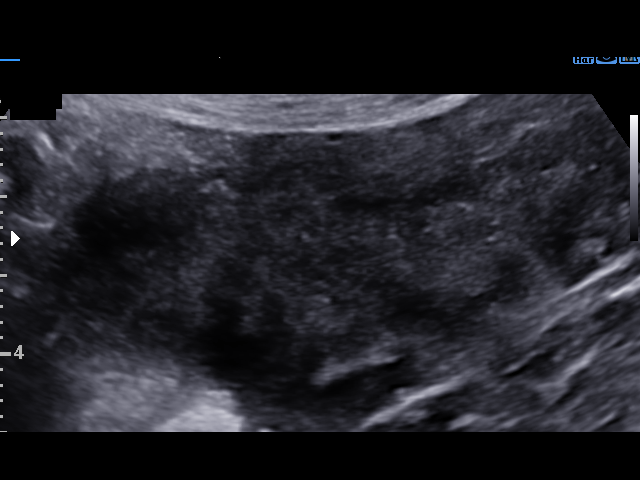

[14 of 28 positions shown; findings below may reference images not displayed]

SUK NP
[REDACTED]-
Faculty Physician

Indications

18 weeks gestation of pregnancy
Poor obstetric history: Previous preterm
delivery, antepartum
Poor obstetric history: Previous
preeclampsia / eclampsia/gestational HTN
Poor obstetric history: Previous gestational
diabetes
Encounter for antenatal screening for
malformations
Smoking complicating pregnancy, second
trimester
OB History

Blood Type:            Height:  5'1"   Weight (lb):  98        BMI:
Gravidity:    8         Term:   5        Prem:   1        SAB:   1
TOP:          0       Ectopic:  0        Living: 6
Fetal Evaluation

Num Of Fetuses:     1
Fetal Heart         143
Rate(bpm):
Cardiac Activity:   Observed
Presentation:       Transverse, head to maternal left
Placenta:           Anterior, above cervical os
P. Cord Insertion:  Visualized, central
Amniotic Fluid
AFI FV:      Subjectively within normal limits

Largest Pocket(cm)
4.46
Biometry

BPD:      41.5  mm     G. Age:  18w 4d         70  %    CI:        69.43   %    70 - 86
FL/HC:      18.2   %    15.8 - 18
HC:       159   mm     G. Age:  18w 6d         72  %    HC/AC:      1.08        1.07 -
AC:      147.2  mm     G. Age:  20w 0d         93  %    FL/BPD:     69.6   %
FL:       28.9  mm     G. Age:  18w 6d         71  %    FL/AC:      19.6   %    20 - 24
HUM:      29.1  mm     G. Age:  19w 3d         91  %
CER:      18.8  mm     G. Age:  18w 3d         58  %
NFT:       7.7  mm

CM:        3.9  mm

Est. FW:     289  gm    0 lb 10 oz      68  %
Gestational Age

LMP:           18w 1d        Date:  02/01/17                 EDD:   11/08/17
U/S Today:     19w 1d                                        EDD:   11/01/17
Best:          18w 1d     Det. By:  LMP  (02/01/17)          EDD:   11/08/17
Anatomy

Cranium:               Appears normal         Aortic Arch:            Appears normal
Cavum:                 Appears normal         Ductal Arch:            Appears normal
Ventricles:            Appears normal         Diaphragm:              Not well visualized
Choroid Plexus:        Appears normal         Stomach:                Appears normal, left
sided
Cerebellum:            Appears normal         Abdomen:                Appears normal
Posterior Fossa:       Appears normal         Abdominal Wall:         Appears nml (cord
insert, abd wall)
Nuchal Fold:           Appears normal         Cord Vessels:           Appears normal (3
vessel cord)
Face:                  Orbits nl; profile not Kidneys:                Appear normal
well visualized
Lips:                  Not well visualized    Bladder:                Appears normal
Thoracic:              Appears normal         Spine:                  Appears normal
Heart:                 Not well visualized    Upper Extremities:      Appears normal
RVOT:                  Not well visualized    Lower Extremities:      Appears normal
LVOT:                  Not well visualized

Other:  Fetus appears to be a female. Heels and right 5th digit visualized.
Technically difficult due to fetal position.
Cervix Uterus Adnexa

Cervix
Length:           4.17  cm.
Normal appearance by transabdominal scan.

Uterus
No abnormality visualized.

Left Ovary
Within normal limits.
Right Ovary
Within normal limits.

Adnexa:       No abnormality visualized. No adnexal mass
visualized.
Impression

Singleton intrauterine pregnancy at 18 weeks 1 day gestation
with fetal cardiac activity
Anterior placenta without evidence of previa
Transverse presentation
Normal appearing fetal growth and amniotic fluid volume
Limited fetal anatomic survey secondary to fetal position
Normal appearing cervical length
Recommendations

Recommend follow-up ultrasound examination in 
 4 weeks
for completion of fetal anatomic survey

## 2018-05-31 ENCOUNTER — Other Ambulatory Visit (HOSPITAL_COMMUNITY)
Admission: RE | Admit: 2018-05-31 | Discharge: 2018-05-31 | Disposition: A | Discharge status: Home / Self Care | Payer: Medicaid Other | Source: Ambulatory Visit | Attending: Advanced Practice Midwife | Admitting: Advanced Practice Midwife

## 2018-05-31 ENCOUNTER — Encounter: Payer: Self-pay | Admitting: Certified Nurse Midwife

## 2018-05-31 ENCOUNTER — Ambulatory Visit: Payer: Medicaid Other | Admitting: Certified Nurse Midwife

## 2018-05-31 VITALS — BP 117/77 | HR 65 | Ht 60.0 in | Wt 99.9 lb

## 2018-05-31 DIAGNOSIS — Z8669 Personal history of other diseases of the nervous system and sense organs: Secondary | ICD-10-CM

## 2018-05-31 DIAGNOSIS — N939 Abnormal uterine and vaginal bleeding, unspecified: Secondary | ICD-10-CM | POA: Insufficient documentation

## 2018-05-31 MED ORDER — IBUPROFEN 600 MG PO TABS: 600.0000 mg | ORAL_TABLET | Freq: Four times a day (QID) | ORAL | 0 refills | Status: DC | PRN

## 2018-05-31 NOTE — Progress Notes (Signed)
Pt reports irregular bleeding - sometimes heavy with clots since receiving Depo Provera on 12/17/17. She has only had 1 Depo Provera injection. She desires to continue Depo for birth control. Her last episode of unprotected intercourse was 3-4 days ago. Pt desires Rx refill of ibuprofen due to frequent H/A's. Pt requests testing for all STI's.

## 2018-05-31 NOTE — Patient Instructions (Signed)

## 2018-05-31 NOTE — Progress Notes (Signed)
  Subjective:     Patient ID: Margaret Cain, female   DOB: 05/09/1986, 32 y.o.   MRN: [id number]  Margaret GumsMeghan M Manternach is a 32 y.o. [id number] here for problem visit. She reports irregular vaginal bleeding since receiving Depo injection on 12/17/17. She delivered in January and reports no vaginal bleeding until receiving depo injections. Vaginal bleeding occurs irregularly, varies in length and amount. She reports bleeding at least every 2 weeks for 3-4 days. She did not receive Depo injection scheduled in June due to transportation issues. She denies changes in partners and states last IC that was unprotected was 3 days ago. She denies other GYN concerns. Patient wants to continue depo injection for birth control. Pt desires STD testing.     Review of Systems  Constitutional: Negative.   Respiratory: Negative.   Cardiovascular: Negative.   Gastrointestinal: Negative.   Genitourinary: Positive for vaginal bleeding.  Neurological: Negative.      Objective:   Physical Exam  Constitutional: She is oriented to person, place, and time. She appears well-developed and well-nourished. No distress.  HENT:  Head: Normocephalic.  Cardiovascular: Normal rate, regular rhythm and normal heart sounds.  No murmur heard. Pulmonary/Chest: Effort normal and breath sounds normal. No respiratory distress. She has no wheezes.  Abdominal: Soft. Bowel sounds are normal. She exhibits no distension. There is no tenderness.  Genitourinary: Uterus normal. Uterus is not enlarged and not tender. Cervix exhibits no motion tenderness. Right adnexum displays no mass and no tenderness. Left adnexum displays no mass and no tenderness. There is bleeding in the vagina.  Musculoskeletal: Normal range of motion.  Neurological: She is alert and oriented to person, place, and time.  Skin: Skin is warm.  Psychiatric: She has a normal mood and affect. Her behavior is normal. Thought content normal.     Assessment/Plan:  1. Abnormal  uterine bleeding (AUB) -Educated and discussed with patient AUB and the use of Depo or other birth control method as treatment.  -Discussed plan of care with patient with starting Depo again and follow up in three months. If AUB continues after 3 months of injection will order US to assess for structural abnormalities. Patient agrees to plan of care.  - STD testing completed  - CBC - Cervicovaginal ancillary only - HIV antibody (with reflex) - Hepatitis C Antibody - Hepatitis B Surface AntiGEN  2. Hx of migraine headaches - Patient currently takes ibuprofen for HA with relief  - Patient request refill of ibuprofen  - ibuprofen (ADVIL,MOTRIN) 600 MG tablet; Take 1 tablet (600 mg total) by mouth every 6 (six) hours as needed.  Dispense: 60 tablet; Refill: 0   Follow up in 2 weeks for Depo injection  Follow up in 3 months after injection for reassessment of vaginal bleeding  Will follow up as needed with results of STD testing   Sharyon CableVeronica C Naod Sweetland, CNM 05/31/18, 1:46 PM

## 2018-06-01 LAB — CBC
Hematocrit: 40.9 % (ref 34.0–46.6)
Hemoglobin: 12.9 g/dL (ref 11.1–15.9)
MCH: 27.2 pg (ref 26.6–33.0)
MCHC: 31.5 g/dL (ref 31.5–35.7)
MCV: 86 fL (ref 79–97)
Platelets: 292 10*3/uL (ref 150–450)
RBC: 4.75 x10E6/uL (ref 3.77–5.28)
RDW: 14.9 % (ref 12.3–15.4)
WBC: 4.5 10*3/uL (ref 3.4–10.8)

## 2018-06-01 LAB — HEPATITIS B SURFACE ANTIGEN: Hepatitis B Surface Ag: NEGATIVE

## 2018-06-01 LAB — HIV ANTIBODY (ROUTINE TESTING W REFLEX): HIV Screen 4th Generation wRfx: NONREACTIVE

## 2018-06-01 LAB — HEPATITIS C ANTIBODY: Hep C Virus Ab: <0.1 s/co ratio (ref 0.0–0.9)

## 2018-06-03 LAB — CERVICOVAGINAL ANCILLARY ONLY
Bacterial vaginitis: NEGATIVE
Candida vaginitis: NEGATIVE
Chlamydia: NEGATIVE
Neisseria Gonorrhea: NEGATIVE
Trichomonas: NEGATIVE

## 2018-06-14 ENCOUNTER — Ambulatory Visit: Payer: Medicaid Other

## 2018-06-18 ENCOUNTER — Ambulatory Visit: Payer: Medicaid Other

## 2018-07-21 IMAGING — US US MFM OB TRANSVAGINAL
1 series · 15 of 19 positions shown · non-contrast
Comparison: none

[Series 1: us mfm ob transvaginal · 19 acquisitions, 15 frames shown]
[im 1/19]
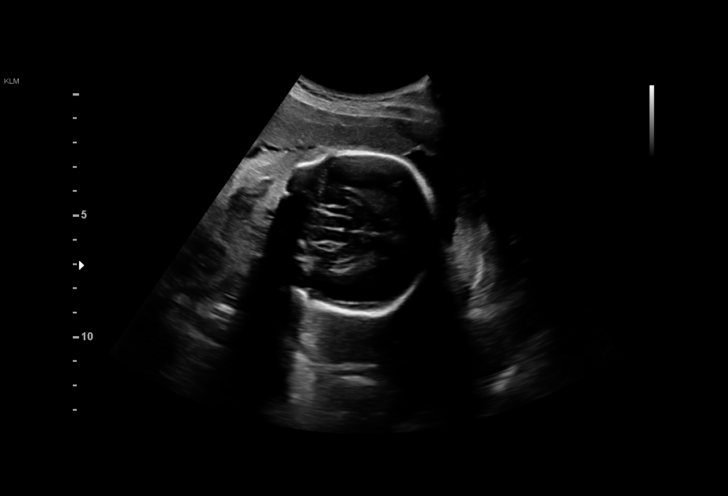
[im 2/19]
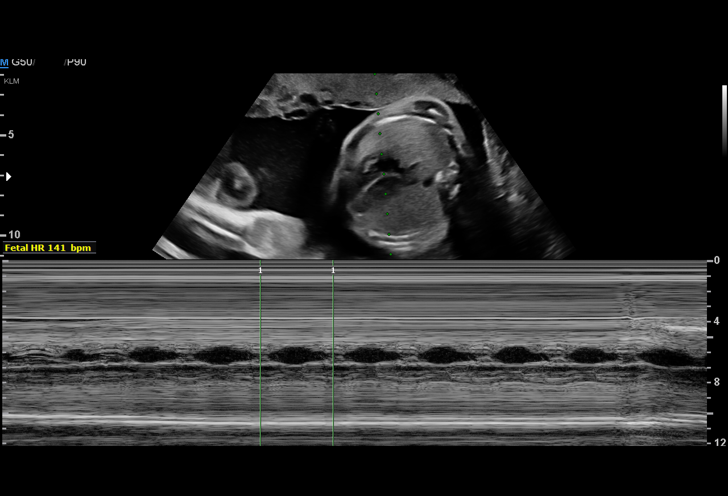
[im 4/19]
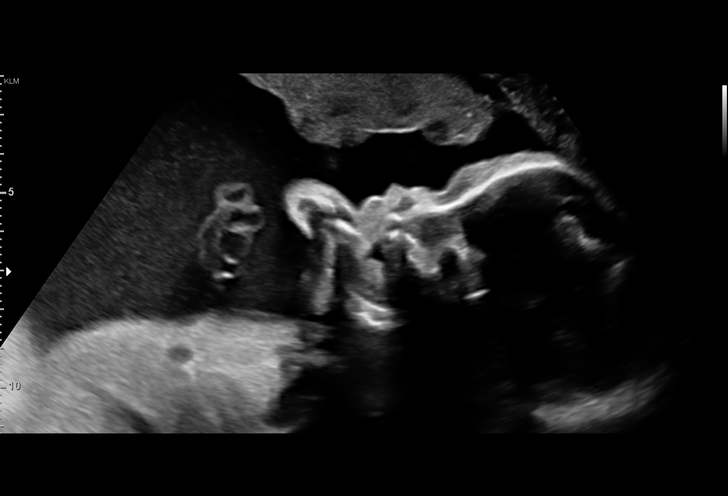
[im 5/19]
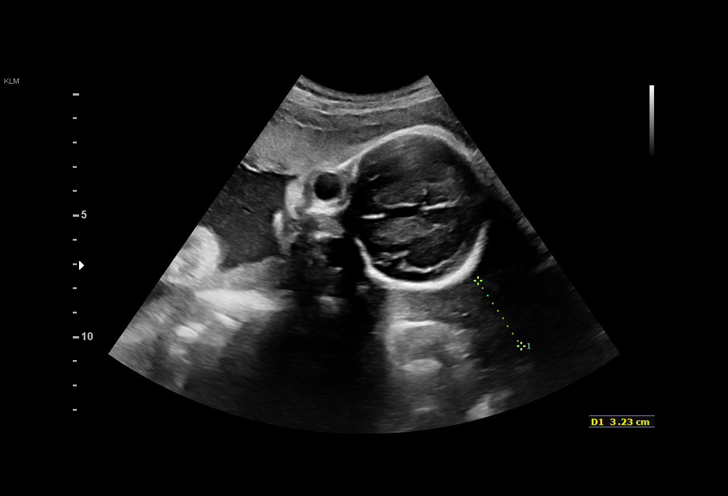
[im 6/19]
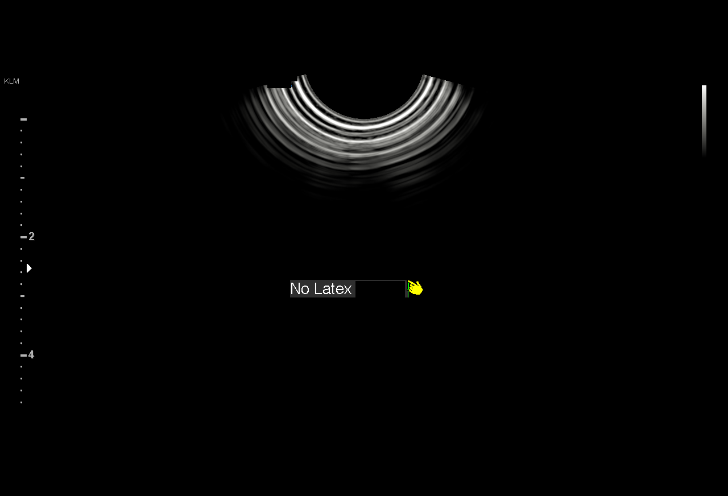
[im 7/19]
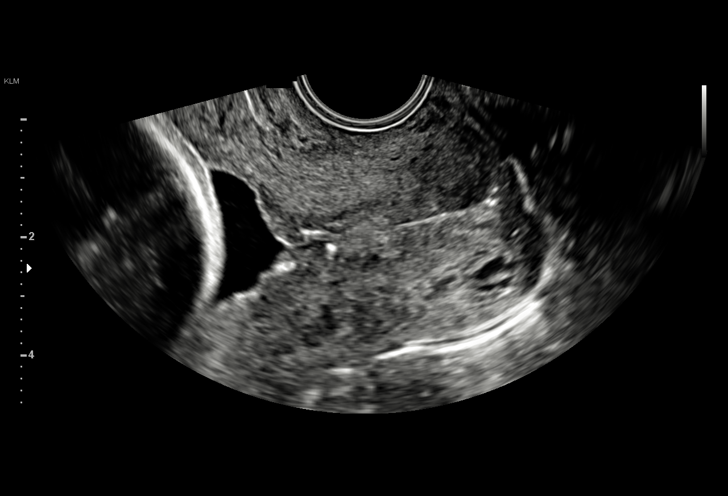
[im 9/19]
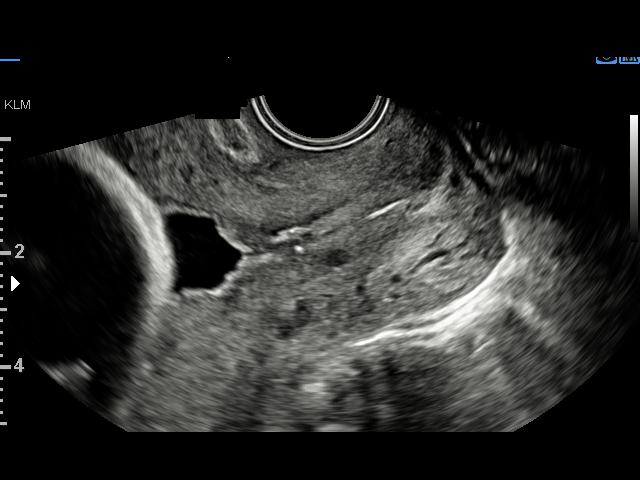
[im 10/19]
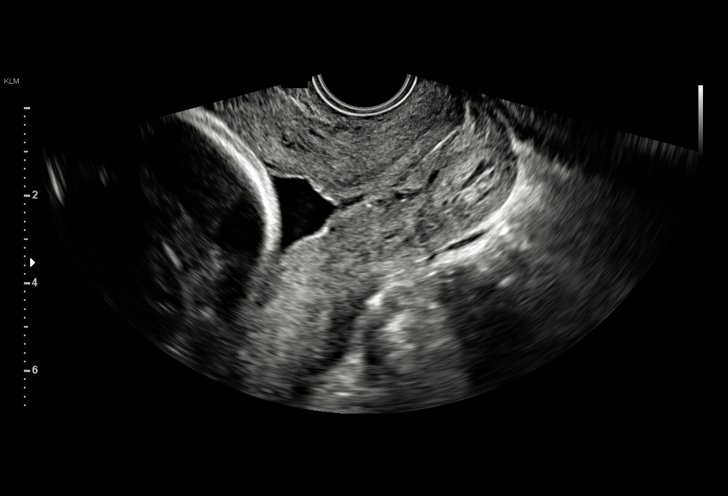
[im 11/19]
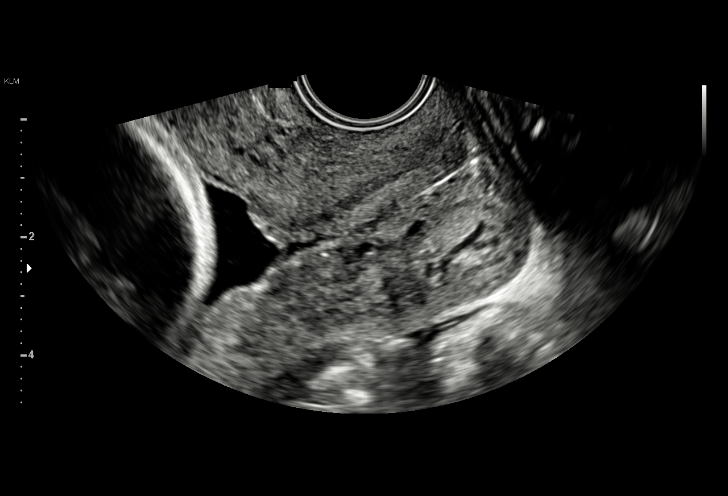
[im 13/19]
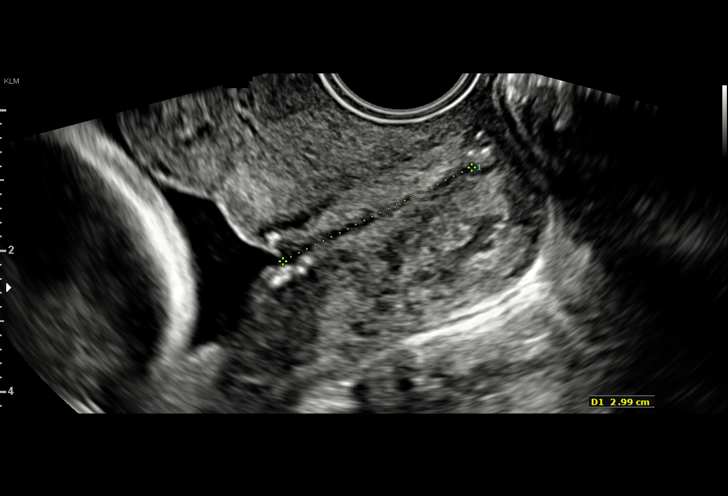
[im 14/19]
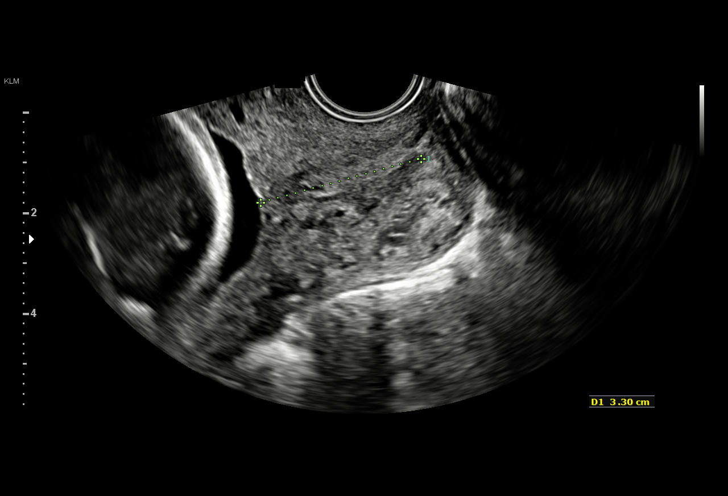
[im 15/19]
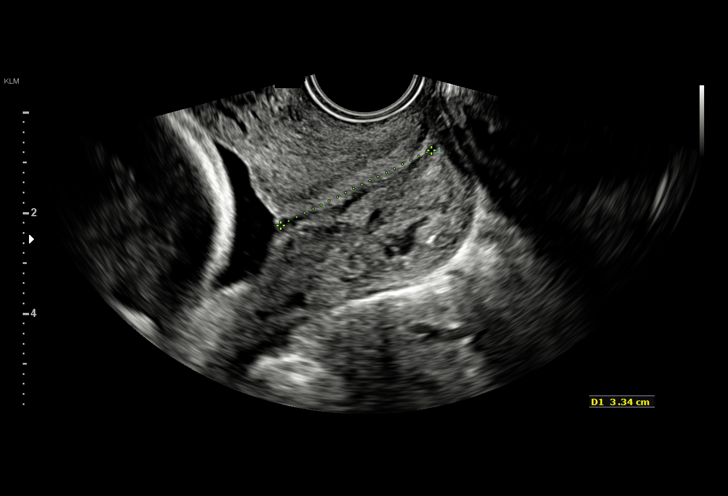
[im 16/19]
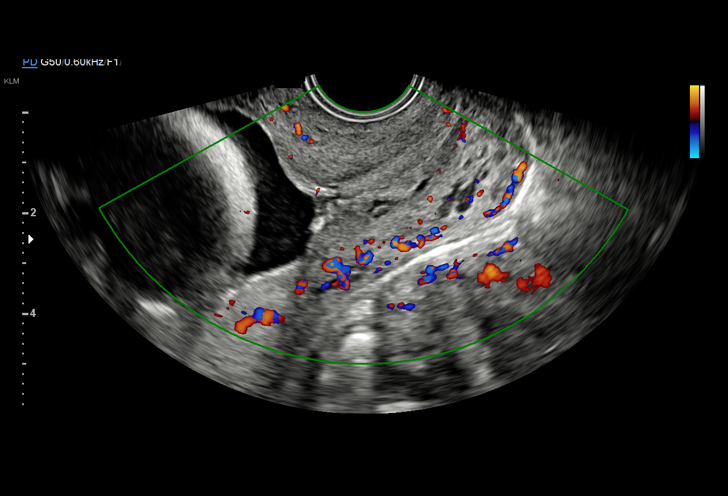
[im 18/19]
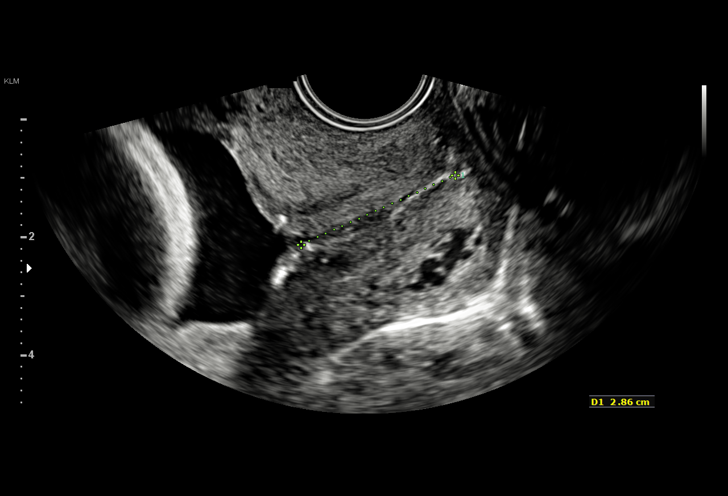
[im 19/19]
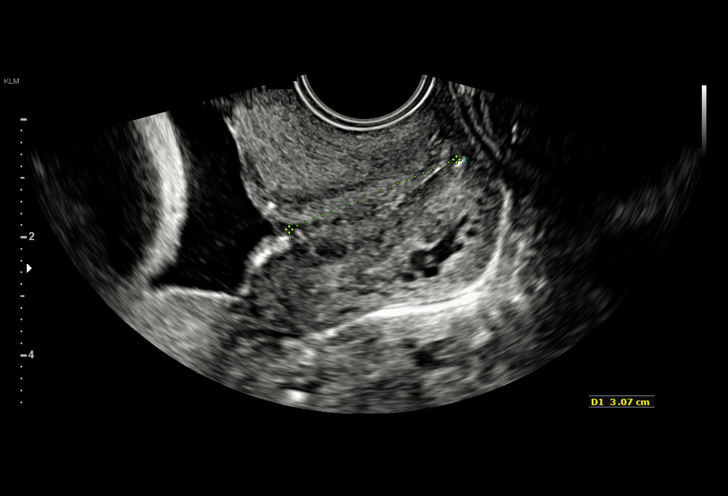

[15 of 19 positions shown; findings below may reference images not displayed]

[facility] NP
[facility]

1  [address]EN[address] OLOGO          [id number]      [id number]     [id number]
Indications

25 weeks gestation of pregnancy
Preterm contractions
Poor obstetric history: Previous preterm
delivery, antepartum
Poor obstetric history: Previous
preeclampsia / eclampsia/gestational HTN
Poor obstetric history: Previous gestational
diabetes
Smoking complicating pregnancy, second
trimester
OB History

Blood Type:            Height:  5'1"   Weight (lb):  98        BMI:
Gravidity:    8         Term:   5        Prem:   1        SAB:   1
TOP:          0       Ectopic:  0        Living: 6
Fetal Evaluation

Num Of Fetuses:     1
Fetal Heart         141
Rate(bpm):
Cardiac Activity:   Observed
Presentation:       Cephalic
Amniotic Fluid
AFI FV:      Subjectively within normal limits
Gestational Age

LMP:           25w 6d        Date:  02/01/17                 EDD:   11/08/17
Best:          25w 6d     Det. By:  LMP  (02/01/17)          EDD:   11/08/17
Cervix Uterus Adnexa

Cervix
Length:              3  cm.
Normal appearance by transabdominal scan.
Impression

SIUP at 25+6 weeks
Normal amniotic fluid volume
EV views of cervix: normal length without funneling
Recommendations

CL previously scheduled for [DATE]

## 2018-08-05 ENCOUNTER — Telehealth: Payer: Self-pay | Admitting: *Deleted

## 2018-08-05 NOTE — Telephone Encounter (Signed)
Patient sent a MyChart message re: her bleeding and asked for a call. I called and left a message I am returning her call. Please call us back or send MyChart message if you still need to talk to someone.

## 2018-09-02 ENCOUNTER — Ambulatory Visit (INDEPENDENT_AMBULATORY_CARE_PROVIDER_SITE_OTHER): Payer: Medicaid Other | Admitting: Advanced Practice Midwife

## 2018-09-02 ENCOUNTER — Encounter: Payer: Self-pay | Admitting: Advanced Practice Midwife

## 2018-09-02 ENCOUNTER — Other Ambulatory Visit (HOSPITAL_COMMUNITY)
Admission: RE | Admit: 2018-09-02 | Discharge: 2018-09-02 | Disposition: A | Discharge status: Home / Self Care | Payer: Medicaid Other | Source: Ambulatory Visit | Attending: Advanced Practice Midwife | Admitting: Advanced Practice Midwife

## 2018-09-02 VITALS — BP 127/91 | HR 79 | Wt 98.0 lb

## 2018-09-02 DIAGNOSIS — Z3042 Encounter for surveillance of injectable contraceptive: Secondary | ICD-10-CM

## 2018-09-02 DIAGNOSIS — Z01419 Encounter for gynecological examination (general) (routine) without abnormal findings: Secondary | ICD-10-CM | POA: Insufficient documentation

## 2018-09-02 DIAGNOSIS — N939 Abnormal uterine and vaginal bleeding, unspecified: Secondary | ICD-10-CM

## 2018-09-02 DIAGNOSIS — Z0000 Encounter for general adult medical examination without abnormal findings: Secondary | ICD-10-CM | POA: Diagnosis not present

## 2018-09-02 LAB — POCT PREGNANCY, URINE: PREG TEST UR: NEGATIVE

## 2018-09-02 MED ORDER — MEDROXYPROGESTERONE ACETATE 150 MG/ML IM SUSP
150.0000 mg | Freq: Once | INTRAMUSCULAR | Status: AC
  Administered 2018-09-02: 150 mg via INTRAMUSCULAR

## 2018-09-02 NOTE — Patient Instructions (Signed)

## 2018-09-02 NOTE — Progress Notes (Signed)
urOct 4-9 , 21- Nov 7 , 14th- Now

## 2018-09-02 NOTE — Progress Notes (Signed)
Patient ID: Margaret Cain, female   DOB: January 29, 1986, 32 y.o.   MRN: [id number] GYNECOLOGY ANNUAL PREVENTATIVE CARE ENCOUNTER NOTE  Subjective:   Margaret Cain is a 32 y.o. [id number] female here for a routine annual gynecologic exam.  Current complaints: irregular periods.   Denies abnormal vaginal bleeding, discharge, pelvic pain, problems with intercourse or other gynecologic concerns.   Irregular cycles: Patient states that sometimes she has back pain/cramping, but then will not have a period. She also has irregular bleeding. 07/19/18-07/24/18 bleeding 08/05/18-08/22/18 bleeding 08/29/18 started bleeding and has been bleeding since then.  She had a Depo injection 12/2017, was planning to come back for another injection, but did not. She states that this irregular bleeding started in March when she got the depo, and has not resolved since.    Gynecologic History No LMP recorded. Patient has had an injection. Contraception: Depo-Provera injections, patient wants to continue with this method of birth control at this time  Last Pap: 2018. Results were: abnormal Last mammogram: NA. Results were: NA  Obstetric History OB History  Gravida Para Term Preterm AB Living  8 7 6 1 1 7   SAB TAB Ectopic Multiple Live Births  1 0 0 0 7    # Outcome Date GA Lbr Len/2nd Weight Sex Delivery Anes PTL Lv  8 Term 11/07/17 [redacted]w[redacted]d 06:29 / 00:17 8 lb 0.8 oz (3.65 kg) F Vag-Spont EPI  LIV  7 Term 03/19/16 [redacted]w[redacted]d 01:45 / 00:07 7 lb 5.1 oz (3.32 kg) M Vag-Spont EPI  LIV     Birth Comments: none  6 Term 09/08/14 [redacted]w[redacted]d 03:40 / 00:07 6 lb 15.3 oz (3.155 kg) F Vag-Spont None  LIV  5 Term 09/20/13 [redacted]w[redacted]d 04:10 / 00:31 7 lb (3.175 kg) F Vag-Spont EPI  LIV  4 Preterm 07/17/12 [redacted]w[redacted]d 19:05 / 00:12 4 lb 5.7 oz (1.975 kg) M Vag-Spont EPI  LIV  3 SAB 10/05/11          2 Term 10/15/10 [redacted]w[redacted]d  7 lb 2 oz (3.232 kg) M Vag-Spont   LIV  1 Term 2005 [redacted]w[redacted]d  5 lb 7 oz (2.466 kg) M Vag-Spont   LIV    Past Medical History:  Diagnosis Date   . Anemia   . Anxiety   . Depression    ppd with first child  . Gestational diabetes    diet controlled  . Headache(784.0)   . Hx of chlamydia infection   . Positive GBS test 03/18/2017  . Pregnancy induced hypertension   . Preterm labor   . Trichomonosis   . Urinary tract infection   . 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 COLPOSCOPY      Current Outpatient Medications on File Prior to Visit  Medication Sig Dispense Refill  . ibuprofen (ADVIL,MOTRIN) 600 MG tablet Take 1 tablet (600 mg total) by mouth every 6 (six) hours as needed. (Patient not taking: Reported on 09/02/2018) 60 tablet 0   No current facility-administered medications on file prior to visit.     Allergies  Allergen Reactions  . Penicillins Nausea And Vomiting    Has patient had a PCN reaction causing immediate rash, facial/tongue/throat swelling, SOB or lightheadedness with hypotension: No Has patient had a PCN reaction causing severe rash involving mucus membranes or skin necrosis: No Has patient had a PCN reaction that required hospitalization: No Has patient had a PCN reaction occurring within  the last 10 years: No If all of the above answers are "NO", then may proceed with Cephalosporin use.     Social History   Socioeconomic History  . Marital status: Single    Spouse name: Not on file  . Number of children: Not on file  . Years of education: Not on file  . Highest education level: Not on file  Occupational History  . Not on file  Social Needs  . Financial resource strain: Not on file  . Food insecurity:    Worry: Not on file    Inability: Not on file  . Transportation needs:    Medical: Not on file    Non-medical: Not on file  Tobacco Use  . Smoking status: Never Smoker  . Smokeless tobacco: Never Used  Substance and Sexual Activity  . Alcohol use: No  . Drug use: No  . Sexual activity: Yes    Birth  control/protection: None    Comment: 1 partner  Lifestyle  . Physical activity:    Days per week: Not on file    Minutes per session: Not on file  . Stress: Not on file  Relationships  . Social connections:    Talks on phone: Not on file    Gets together: Not on file    Attends religious service: Not on file    Active member of club or organization: Not on file    Attends meetings of clubs or organizations: Not on file    Relationship status: Not on file  . Intimate partner violence:    Fear of current or ex partner: Not on file    Emotionally abused: Not on file    Physically abused: Not on file    Forced sexual activity: Not on file  Other Topics Concern  . Not on file  Social History Narrative   ** Merged History Encounter **        Family History  Problem Relation Age of Onset  . Asthma Son   . Asthma Paternal Aunt   . Anesthesia problems Neg Hx   . Hypotension Neg Hx   . Malignant hyperthermia Neg Hx   . Pseudochol deficiency Neg Hx   . Other Neg Hx   . Cancer Neg Hx   . Heart disease Neg Hx   . Stroke Neg Hx   . Hearing loss Neg Hx     The following portions of the patient's history were reviewed and updated as appropriate: allergies, current medications, past family history, past medical history, past social history, past surgical history and problem list.  Review of Systems Pertinent items noted in HPI and remainder of comprehensive ROS otherwise negative.   Objective:  BP (!) 127/91   Pulse 79   Wt 98 lb (44.5 kg)   BMI 19.14 kg/m  CONSTITUTIONAL: Well-developed, well-nourished female in no acute distress.  HENT:  Normocephalic, atraumatic, External right and left ear normal. Oropharynx is clear and moist EYES: Conjunctivae and EOM are normal. Pupils are equal, round, and reactive to light. No scleral icterus.  NECK: Normal range of motion, supple, no masses.  Normal thyroid.  SKIN: Skin is warm and dry. No rash noted. Not diaphoretic. No erythema. No  pallor. NEUROLOGIC: Alert and oriented to person, place, and time. Normal reflexes, muscle tone coordination. No cranial nerve deficit noted. PSYCHIATRIC: Normal mood and affect. Normal behavior. Normal judgment and thought content. CARDIOVASCULAR: Normal heart rate noted, regular rhythm RESPIRATORY: Clear to auscultation bilaterally. Effort and breath  sounds normal, no problems with respiration noted. BREASTS: Symmetric in size. No masses, skin changes, nipple drainage, or lymphadenopathy. ABDOMEN: Soft, normal bowel sounds, no distention noted.  No tenderness, rebound or guarding.  PELVIC: Normal appearing external genitalia; normal appearing vaginal mucosa and cervix.  No abnormal discharge noted.  Pap smear obtained.  Normal uterine size, no other palpable masses, no uterine or adnexal tenderness. MUSCULOSKELETAL: Normal range of motion. No tenderness.  No cyanosis, clubbing, or edema.  2+ distal pulses.  ENDOMETRIAL BIOPSY     The indications for endometrial biopsy were reviewed.   Risks of the biopsy including cramping, bleeding, infection, uterine perforation, inadequate specimen and need for additional procedures  were discussed. The patient states she understands and agrees to undergo procedure today. Consent was signed. Time out was performed. Urine HCG was negative. During the pelvic exam, the cervix was prepped with Betadine. A single-toothed tenaculum was placed on the anterior lip of the cervix to stabilize it. The 3 mm pipelle was introduced into the endometrial cavity without difficulty to a depth of 8 cm, and a moderate amount of tissue was obtained and sent to pathology. The instruments were removed from the patient's vagina. Minimal bleeding from the cervix was noted. The patient tolerated the procedure well. Routine post-procedure instructions were given to the patient.     Assessment and Plan:  1. Well woman exam with routine gynecological exam - Cytology - PAP( [facility])  2. Abnormal uterine bleeding - Pap - endometrial bx - Pelvic US  3. Desires contraception - Patient would like to continue depo. DW her that this is likely contributing to her bleeding, but she would like to continue.    Will follow up results of pap smear and manage accordingly. Mammogram scheduled Routine preventative health maintenance measures emphasized. Please refer to After Visit Summary for other counseling recommendations.

## 2018-09-04 LAB — CYTOLOGY - PAP
CHLAMYDIA, DNA PROBE: POSITIVE — AB
DIAGNOSIS: NEGATIVE
HPV (WINDOPATH): NOT DETECTED
Neisseria Gonorrhea: NEGATIVE

## 2018-09-05 ENCOUNTER — Telehealth: Payer: Self-pay | Admitting: General Practice

## 2018-09-05 ENCOUNTER — Telehealth: Payer: Self-pay | Admitting: Family Medicine

## 2018-09-05 DIAGNOSIS — A749 Chlamydial infection, unspecified: Secondary | ICD-10-CM

## 2018-09-05 MED ORDER — AZITHROMYCIN 250 MG PO TABS: 1000.0000 mg | ORAL_TABLET | Freq: Once | ORAL | 1 refills | Status: AC

## 2018-09-05 NOTE — Telephone Encounter (Signed)
Please see telephone noted also dated today- patient has been notifed of results and treatment.

## 2018-09-05 NOTE — Telephone Encounter (Signed)
Margaret Cain called front desk who notified nurse to call patient back. I called Margaret Cain and explained she tested positive for chlamydia on her  Pap, other tests on pap negative. I explained azithromycin sent to her pharmacy for treatment and that a refill was also sent so her partner could be treated. I explained they should avoid intimate contact/ intercourse for 2 weeks after both treated. She voices understanding.

## 2018-09-05 NOTE — Telephone Encounter (Signed)
Pt called stating that she would like to speak to someone about her results that she just received. Has questions about them and when she can come in to get treated.

## 2018-09-05 NOTE — Telephone Encounter (Signed)
Per Thressa ShellerHeather Hogan, + chlamydia. Please call the patient and treat. Zithromax ordered per protocol. Called patient, no answer- left message to call us back as soon as possible for results. STD card completed.

## 2018-09-06 ENCOUNTER — Ambulatory Visit (HOSPITAL_COMMUNITY): Payer: Medicaid Other | Attending: Advanced Practice Midwife

## 2018-09-23 ENCOUNTER — Ambulatory Visit (HOSPITAL_COMMUNITY): Admission: RE | Admit: 2018-09-23 | Payer: Medicaid Other | Source: Ambulatory Visit

## 2018-09-27 ENCOUNTER — Ambulatory Visit (HOSPITAL_COMMUNITY): Payer: Medicaid Other | Attending: Advanced Practice Midwife

## 2018-10-18 ENCOUNTER — Telehealth: Payer: Self-pay | Admitting: Family Medicine

## 2018-10-18 NOTE — Telephone Encounter (Signed)
Pt called stating that she would like to speak with her doctor about concerns of weight and bleeding issues since we last depo. Informed patient that I would put in message to the nurses and they will reach out to her by today or Monday. Verbalized understanding

## 2018-10-21 NOTE — Telephone Encounter (Signed)
mychart message sent to patient

## 2018-10-22 ENCOUNTER — Encounter: Payer: Self-pay | Admitting: *Deleted

## 2018-11-18 ENCOUNTER — Ambulatory Visit: Payer: Medicaid Other

## 2018-12-02 ENCOUNTER — Emergency Department (HOSPITAL_COMMUNITY)
Admission: EM | Admit: 2018-12-02 | Discharge: 2018-12-03 | Disposition: A | Discharge status: Home / Self Care | Payer: Medicaid Other | Attending: Emergency Medicine | Admitting: Emergency Medicine

## 2018-12-02 ENCOUNTER — Encounter (HOSPITAL_COMMUNITY): Payer: Self-pay | Admitting: Emergency Medicine

## 2018-12-02 DIAGNOSIS — R208 Other disturbances of skin sensation: Secondary | ICD-10-CM | POA: Diagnosis present

## 2018-12-02 DIAGNOSIS — H6123 Impacted cerumen, bilateral: Secondary | ICD-10-CM | POA: Diagnosis not present

## 2018-12-02 DIAGNOSIS — Z711 Person with feared health complaint in whom no diagnosis is made: Secondary | ICD-10-CM | POA: Diagnosis not present

## 2018-12-02 DIAGNOSIS — R51 Headache: Secondary | ICD-10-CM | POA: Insufficient documentation

## 2018-12-02 MED ORDER — CARBAMIDE PEROXIDE 6.5 % OT SOLN: 5.0000 [drp] | Freq: Two times a day (BID) | OTIC | 0 refills | Status: AC

## 2018-12-02 NOTE — Discharge Instructions (Signed)
Please follow up with your primary care provider within 5-7 days for re-evaluation of your symptoms. If you do not have a primary care provider, information for a healthcare clinic has been provided for you to make arrangements for follow up care.  Please return to the emergency room immediately if you experience any new or worsening symptoms or any symptoms that indicate worsening infection such as fevers, increased redness/swelling/pain, warmth, or drainage from the affected area.    

## 2018-12-02 NOTE — ED Triage Notes (Signed)
Pt BIB EMS for insect bite that happened last night. Pt also stating she has pain in her right ear  EMS vitals BP102/72 P90 RR18 o298%

## 2018-12-02 NOTE — ED Provider Notes (Signed)
[facility] EMERGENCY DEPARTMENT Provider Note   CSN: [id number] Arrival date & time: 12/02/18  2013    History   Chief Complaint Chief Complaint  Patient presents with  . Insect Bite  . Otalgia    HPI Shelanda WESLIE WARBURTON is a 33 y.o. female.     HPI   Pt is a 34 y/o female with ah /o anemia, anxiety/depression, gestational diabetes, HA, who presents to the ED today via EMS for eval of possible insect bite. States she felt a stinging sensation on her left side of her abdomen. When she looked at her skin she had multiple small itchy bumps on the abdomen. States she instantly had a headache and has intermittent headaches since then. States that the pain is "slight". No dizziness, lightheadedness, vision changes, unilateral numbness/weakness. States she feels generalized weakness and "tenseness" to her thighs. She also c/o right ear pain. No other uri sxs.  Past Medical History:  Diagnosis Date  . Anemia   . Anxiety   . Depression    ppd with first child  . Gestational diabetes    diet controlled  . Headache(784.0)   . Hx of chlamydia infection   . Positive GBS test 03/18/2017  . Pregnancy induced hypertension   . Preterm labor   . Trichomonosis   . Urinary tract infection   . 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 Migraine headache 06/08/2017  . Anemia affecting pregnancy, antepartum 02/17/2016  . ASCUS with positive high risk HPV cervical 10/21/2015  . Pica 10/19/2015  . Smoker 12/10/2014    Past Surgical History:  Procedure Laterality Date  . COLPOSCOPY       OB History    Gravida  8   Para  7   Term  6   Preterm  1   AB  1   Living  7     SAB  1   TAB  0   Ectopic  0   Multiple  0   Live Births  7            Home Medications    Prior to Admission medications   Medication Sig Start Date End Date Taking? Authorizing Provider  carbamide  peroxide (DEBROX) 6.5 % OTIC solution Place 5 drops into both ears 2 (two) times daily for 3 days. 12/02/18 12/05/18  Assia Meanor S, PA-C  ibuprofen (ADVIL,MOTRIN) 600 MG tablet Take 1 tablet (600 mg total) by mouth every 6 (six) hours as needed. Patient not taking: Reported on 09/02/2018 05/31/18   Sharyon Cable, CNM    Family History Family History  Problem Relation Age of Onset  . Asthma Son   . Asthma Paternal Aunt   . Anesthesia problems Neg Hx   . Hypotension Neg Hx   . Malignant hyperthermia Neg Hx   . Pseudochol deficiency Neg Hx   . Other Neg Hx   . Cancer Neg Hx   . Heart disease Neg Hx   . Stroke Neg Hx   . Hearing loss Neg Hx     Social History Social History   Tobacco Use  . Smoking status: Never Smoker  . Smokeless tobacco: Never Used  Substance Use Topics  . Alcohol use: No  . Drug use: No     Allergies   Penicillins   Review of Systems Review of Systems  Constitutional: Negative for  chills, diaphoresis and fever.  HENT: Positive for ear pain. Negative for congestion, rhinorrhea and sore throat.   Eyes: Negative for visual disturbance.  Respiratory: Negative for cough and shortness of breath.   Cardiovascular: Negative for chest pain.  Gastrointestinal: Negative for abdominal pain, constipation, diarrhea, nausea and vomiting.  Genitourinary: Negative for dysuria.  Musculoskeletal: Negative for myalgias.  Skin:       Possible insect bite  Neurological: Positive for headaches. Negative for dizziness, weakness, light-headedness and numbness.     Physical Exam Updated Vital Signs BP 115/80 (BP Location: Right Arm)   Pulse 85   Temp 98.2 F (36.8 C) (Oral)   Resp 16   Ht 5' (1.524 m)   Wt 52.2 kg   SpO2 98%   BMI 22.46 kg/m   Physical Exam Vitals signs and nursing note reviewed.  Constitutional:      General: She is not in acute distress.    Appearance: She is well-developed. She is not ill-appearing.  HENT:     Head:  Normocephalic and atraumatic.     Right Ear: There is impacted cerumen.     Left Ear: There is impacted cerumen.     Mouth/Throat:     Pharynx: No oropharyngeal exudate or posterior oropharyngeal erythema.  Eyes:     Extraocular Movements: Extraocular movements intact.     Conjunctiva/sclera: Conjunctivae normal.     Pupils: Pupils are equal, round, and reactive to light.     Comments: No nystagmus  Neck:     Musculoskeletal: Neck supple.  Cardiovascular:     Rate and Rhythm: Normal rate and regular rhythm.     Heart sounds: Normal heart sounds.  Pulmonary:     Effort: Pulmonary effort is normal.     Breath sounds: Normal breath sounds. No wheezing.  Abdominal:     General: Bowel sounds are normal.     Palpations: Abdomen is soft.     Tenderness: There is no guarding.  Musculoskeletal: Normal range of motion.  Skin:    General: Skin is warm and dry.     Comments: Very small scab to the left mid abdomen  Neurological:     Mental Status: She is alert.     Comments: Mental Status:  Alert, thought content appropriate, able to give a coherent history. Speech fluent without evidence of aphasia. Able to follow 2 step commands without difficulty.  Cranial Nerves:  II:  pupils equal, round, reactive to light III,IV, VI: ptosis not present, extra-ocular motions intact bilaterally  V,VII: smile symmetric, facial light touch sensation equal VIII: hearing grossly normal to voice  X: uvula elevates symmetrically  XI: bilateral shoulder shrug symmetric and strong XII: midline tongue extension without fassiculations Motor:  Normal tone. 5/5 strength of BUE and BLE major muscle groups including strong and equal grip strength and dorsiflexion/plantar flexion Sensory: light touch normal in all extremities. Gait: normal gait and balance.         ED Treatments / Results  Labs (all labs ordered are listed, but only abnormal results are displayed) Labs Reviewed - No data to  display  EKG None  Radiology No results found.  Procedures Procedures (including critical care time)  Medications Ordered in ED Medications - No data to display   Initial Impression / Assessment and Plan / ED Course  I have reviewed the triage vital signs and the nursing notes.  Pertinent labs & imaging results that were available during my care of the patient were reviewed by  me and considered in my medical decision making (see chart for details).     Final Clinical Impressions(s) / ED Diagnoses   Final diagnoses:  Concern about skin disease without diagnosis  Bilateral impacted cerumen   Pt here With multiple complaints including concern for possible insect bite and right ear pain.  On exam she has no signs of skin infection or skin breakdown.  No indication for antibiotics at this time.  On exam she also has no evidence of otitis media or otitis externa.  She does have cerumen impactions bilaterally.  Will give Rx for Debrox.  Have advised her to follow-up in 1 week for reevaluation and return if she does experience any signs of infection to the skin.  She voiced understanding the plan and reasons return.  All questions answered patient stable for discharge.  ED Discharge Orders         Ordered    carbamide peroxide (DEBROX) 6.5 % OTIC solution  2 times daily     12/02/18 2321           Karrie MeresCouture, Gennell How S, PA-C 12/02/18 2324    Derwood KaplanNanavati, Ankit, MD 12/03/18 [phone]

## 2018-12-02 NOTE — ED Notes (Signed)
ED Provider at bedside. 
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
## 2019-03-23 ENCOUNTER — Encounter (HOSPITAL_COMMUNITY): Payer: Self-pay

## 2019-03-23 ENCOUNTER — Ambulatory Visit (HOSPITAL_COMMUNITY)
Admission: EM | Admit: 2019-03-23 | Discharge: 2019-03-23 | Disposition: A | Discharge status: Home / Self Care | Payer: Medicaid Other | Attending: Family Medicine | Admitting: Family Medicine

## 2019-03-23 ENCOUNTER — Other Ambulatory Visit: Payer: Self-pay

## 2019-03-23 DIAGNOSIS — Z3201 Encounter for pregnancy test, result positive: Secondary | ICD-10-CM

## 2019-03-23 DIAGNOSIS — Z3A01 Less than 8 weeks gestation of pregnancy: Secondary | ICD-10-CM

## 2019-03-23 LAB — POCT PREGNANCY, URINE: Preg Test, Ur: POSITIVE — AB

## 2019-03-23 NOTE — ED Triage Notes (Signed)
Pt states she took a pregnancy test and it was positive. Pt is looking for confirmation.

## 2019-03-23 NOTE — Discharge Instructions (Signed)
Take prenatal vitamin daily. Follow-up with [facility] for further OB care.

## 2019-03-23 NOTE — ED Provider Notes (Signed)
[place]    CSN: [id number] Arrival date & time: 03/23/19  1451     History   Chief Complaint Chief Complaint  Patient presents with  . pregnant test    HPI Margaret Cain is a 33 y.o. female presenting for pregnancy confirmation.  Patient states he took a home pregnancy test 3 days ago that was positive.  Patient states that she has been feeling "hormonal "for the last week.  LMP 02/13/2019.  She denies fever, fatigue, abdominal or pelvic pain, vaginal discharge.  Not currently taking prenatal vitamin.    Past Medical History:  Diagnosis Date  . Anemia   . Anxiety   . Depression    ppd with first child  . Gestational diabetes    diet controlled  . Headache(784.0)   . Hx of chlamydia infection   . Positive GBS test 03/18/2017  . Pregnancy induced hypertension   . Preterm labor   . Trichomonosis   . Urinary tract infection   . 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 Migraine headache 06/08/2017  . Anemia affecting pregnancy, antepartum 02/17/2016  . ASCUS with positive high risk HPV cervical 10/21/2015  . Pica 10/19/2015  . Smoker 12/10/2014    Past Surgical History:  Procedure Laterality Date  . COLPOSCOPY      OB History    Gravida  8   Para  7   Term  6   Preterm  1   AB  1   Living  7     SAB  1   TAB  0   Ectopic  0   Multiple  0   Live Births  7            Home Medications    Prior to Admission medications   Not on File    Family History Family History  Problem Relation Age of Onset  . Asthma Son   . Asthma Paternal Aunt   . Anesthesia problems Neg Hx   . Hypotension Neg Hx   . Malignant hyperthermia Neg Hx   . Pseudochol deficiency Neg Hx   . Other Neg Hx   . Cancer Neg Hx   . Heart disease Neg Hx   . Stroke Neg Hx   . Hearing loss Neg Hx     Social History Social History   Tobacco Use  . Smoking status: Never Smoker   . Smokeless tobacco: Never Used  Substance Use Topics  . Alcohol use: No  . Drug use: No     Allergies   Penicillins   Review of Systems As per HPI   Physical Exam Triage Vital Signs ED Triage Vitals  Enc Vitals Group     BP 03/23/19 1530 99/70     Pulse Rate 03/23/19 1530 87     Resp 03/23/19 1530 16     Temp 03/23/19 1530 98.4 F (36.9 C)     Temp src --      SpO2 03/23/19 1530 100 %     Weight 03/23/19 1533 115 lb (52.2 kg)     Height --      Head Circumference --      Peak Flow --      Pain Score --      Pain Loc --      Pain Edu? --      Excl. in [place]? --  No data found.  Updated Vital Signs BP 99/70 (BP Location: Right Arm)   Pulse 87   Temp 98.4 F (36.9 C)   Resp 16   Wt 115 lb (52.2 kg)   LMP 02/13/2019   SpO2 100%   BMI 22.46 kg/m   Visual Acuity Right Eye Distance:   Left Eye Distance:   Bilateral Distance:    Right Eye Near:   Left Eye Near:    Bilateral Near:     Physical Exam Constitutional:      General: She is not in acute distress. HENT:     Head: Normocephalic and atraumatic.     Nose: Nose normal.     Mouth/Throat:     Mouth: Mucous membranes are moist.  Eyes:     General: No scleral icterus.    Pupils: Pupils are equal, round, and reactive to light.  Cardiovascular:     Rate and Rhythm: Normal rate.  Pulmonary:     Effort: Pulmonary effort is normal.  Skin:    Coloration: Skin is not jaundiced or pale.  Neurological:     Mental Status: She is alert and oriented to person, place, and time.      UC Treatments / Results  Labs (all labs ordered are listed, but only abnormal results are displayed) Labs Reviewed  POCT PREGNANCY, URINE - Abnormal; Notable for the following components:      Result Value   Preg Test, Ur POSITIVE (*)    All other components within normal limits  POC URINE PREG, ED    EKG None  Radiology No results found.  Procedures Procedures (including critical care time)  Medications  Ordered in UC Medications - No data to display  Initial Impression / Assessment and Plan / UC Course  I have reviewed the triage vital signs and the nursing notes.  Pertinent labs & imaging results that were available during my care of the patient were reviewed by me and considered in my medical decision making (see chart for details).     33 year old female with positive pregnancy test.  Patient currently is asymptomatic, will start prenatal vitamin today follow-up with OB/GYN. Final Clinical Impressions(s) / UC Diagnoses   Final diagnoses:  Less than [redacted] weeks gestation of pregnancy     Discharge Instructions     Take prenatal vitamin daily. Follow-up with women's for further OB care.    ED Prescriptions    None     Controlled Substance Prescriptions [place] Controlled Substance Registry consulted? Not Applicable   Shea EvansHall-Potvin, Brittany, [place]-C 03/23/19 1730

## 2019-04-08 ENCOUNTER — Encounter (HOSPITAL_COMMUNITY): Payer: Self-pay

## 2019-04-08 ENCOUNTER — Inpatient Hospital Stay (HOSPITAL_COMMUNITY)
Admission: AD | Admit: 2019-04-08 | Discharge: 2019-04-08 | Disposition: A | Discharge status: Home / Self Care | Payer: Medicaid Other | Attending: Obstetrics and Gynecology | Admitting: Obstetrics and Gynecology

## 2019-04-08 ENCOUNTER — Other Ambulatory Visit: Payer: Self-pay

## 2019-04-08 DIAGNOSIS — R51 Headache: Secondary | ICD-10-CM | POA: Insufficient documentation

## 2019-04-08 DIAGNOSIS — Z825 Family history of asthma and other chronic lower respiratory diseases: Secondary | ICD-10-CM | POA: Diagnosis not present

## 2019-04-08 DIAGNOSIS — R300 Dysuria: Secondary | ICD-10-CM | POA: Insufficient documentation

## 2019-04-08 DIAGNOSIS — Z87440 Personal history of urinary (tract) infections: Secondary | ICD-10-CM | POA: Insufficient documentation

## 2019-04-08 DIAGNOSIS — R519 Headache, unspecified: Secondary | ICD-10-CM

## 2019-04-08 DIAGNOSIS — O219 Vomiting of pregnancy, unspecified: Secondary | ICD-10-CM | POA: Insufficient documentation

## 2019-04-08 DIAGNOSIS — Z8632 Personal history of gestational diabetes: Secondary | ICD-10-CM | POA: Insufficient documentation

## 2019-04-08 DIAGNOSIS — Z880 Allergy status to penicillin: Secondary | ICD-10-CM | POA: Insufficient documentation

## 2019-04-08 DIAGNOSIS — R1110 Vomiting, unspecified: Secondary | ICD-10-CM | POA: Diagnosis present

## 2019-04-08 DIAGNOSIS — O26891 Other specified pregnancy related conditions, first trimester: Secondary | ICD-10-CM | POA: Diagnosis not present

## 2019-04-08 DIAGNOSIS — Z3A01 Less than 8 weeks gestation of pregnancy: Secondary | ICD-10-CM | POA: Diagnosis not present

## 2019-04-08 LAB — URINALYSIS, ROUTINE W REFLEX MICROSCOPIC
Bacteria, UA: NONE SEEN
Bilirubin Urine: NEGATIVE
Glucose, UA: NEGATIVE mg/dL
Hgb urine dipstick: NEGATIVE
Ketones, ur: 80 mg/dL — AB
Nitrite: NEGATIVE
Protein, ur: NEGATIVE mg/dL
Specific Gravity, Urine: 1.02 (ref 1.005–1.030)
pH: 6 (ref 5.0–8.0)

## 2019-04-08 MED ORDER — LACTATED RINGERS IV BOLUS
1000.0000 mL | Freq: Once | INTRAVENOUS | Status: AC
  Administered 2019-04-08: 1000 mL via INTRAVENOUS

## 2019-04-08 MED ORDER — PROMETHAZINE HCL 25 MG/ML IJ SOLN
25.0000 mg | Freq: Once | INTRAVENOUS | Status: AC
  Administered 2019-04-08: 25 mg via INTRAVENOUS
  Filled 2019-04-08: qty 1

## 2019-04-08 MED ORDER — PROMETHAZINE HCL 12.5 MG PO TABS: 12.5000 mg | ORAL_TABLET | Freq: Four times a day (QID) | ORAL | 0 refills | Status: DC | PRN

## 2019-04-08 MED ORDER — ACETAMINOPHEN 500 MG PO TABS
1000.0000 mg | ORAL_TABLET | Freq: Once | ORAL | Status: AC
  Administered 2019-04-08: 1000 mg via ORAL
  Filled 2019-04-08: qty 2

## 2019-04-08 NOTE — MAU Note (Signed)
Margaret Cain is a 33 y.o. at [redacted]w[redacted]d here in MAU reporting: started feeling nauseated yesterday morning, anytime she tried to eat/drink it would come back up. No pain or bleeding. No diarrhea.   LMP: 02/13/19  Onset of complaint: yesterday morning  Pain score: 0/10  Vitals:   04/08/19 0713 04/08/19 0749  BP: 115/86 101/63  Pulse:  (!) 59  Resp: 16 16  Temp: 98.4 F (36.9 C) 98 F (36.7 C)  SpO2: 100% 100%      Lab orders placed from triage: UA, pt unable to give specimen at this time

## 2019-04-08 NOTE — ED Triage Notes (Signed)
BIB EMS from home, reports N/V X1 day. After looking at chart it was noted pt in pregnant. Pt confirmed this. To be transferred over to MAU.

## 2019-04-08 NOTE — MAU Provider Note (Addendum)
History     CSN: [id number]  Arrival date and time: 04/08/19 7106   First Provider Initiated Contact with Patient 04/08/19 [phone]      Chief Complaint  Patient presents with  . Emesis   HPI Margaret Cain is a 33 y.o. [id number] at [redacted]w[redacted]d who presents to MAU with chief complaint of nausea and vomiting. This is a new problem, onset yesterday morning. Patient states she has not been able to tolerate anything PO since she had [employer] two days ago.  She denies aggravating or alleviating factors. She has not taken any medication or tried other treatments for this complaints.  Patient has secondary complaint of dysuria x 2 episodes. This is a new problem, onset last night. She states she feels less urge to void and is voiding less frequently but is voiding normal amounts. She reports that she is unable to void upon arrival and initial assessment in MAU.  She denies abdominal pain, vaginal bleeding, back pain, flank pain, fever, falls or recent illness.  OB History    Gravida  9   Para  7   Term  6   Preterm  1   AB  1   Living  7     SAB  1   TAB  0   Ectopic  0   Multiple  0   Live Births  7           Past Medical History:  Diagnosis Date  . Anemia   . Anxiety   . Depression    ppd with first child  . Gestational diabetes    diet controlled  . Headache(784.0)   . Hx of chlamydia infection   . Positive GBS test 03/18/2017  . Pregnancy induced hypertension   . Preterm labor   . Trichomonosis   . Urinary tract infection   . 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 COLPOSCOPY      Family History  Problem Relation Age of Onset  . Asthma Son   . Asthma Paternal Aunt   . Anesthesia problems Neg Hx   . Hypotension Neg Hx   . Malignant hyperthermia Neg Hx   . Pseudochol deficiency Neg Hx   . Other Neg Hx   . Cancer Neg Hx   . Heart disease Neg Hx   . Stroke Neg Hx   . Hearing  loss Neg Hx     Social History   Tobacco Use  . Smoking status: Never Smoker  . Smokeless tobacco: Never Used  Substance Use Topics  . Alcohol use: No  . Drug use: No    Allergies:  Allergies  Allergen Reactions  . Penicillins Nausea And Vomiting    Has patient had a PCN reaction causing immediate rash, facial/tongue/throat swelling, SOB or lightheadedness with hypotension: No Has patient had a PCN reaction causing severe rash involving mucus membranes or skin necrosis: No Has patient had a PCN reaction that required hospitalization: No Has patient had a PCN reaction occurring within the last 10 years: No If all of the above answers are "NO", then may proceed with Cephalosporin use.     No medications prior to admission.    Review of Systems  Constitutional: Positive for fatigue. Negative for chills and fever.  Respiratory: Negative for shortness of breath.   Gastrointestinal: Positive for nausea and vomiting. Negative for abdominal pain.  Genitourinary: Positive for dysuria.  Negative for difficulty urinating, flank pain, frequency, hematuria, vaginal bleeding, vaginal discharge and vaginal pain.  Musculoskeletal: Negative for back pain.  Neurological: Negative for dizziness, syncope, weakness and headaches.  All other systems reviewed and are negative.  Physical Exam   Blood pressure 109/76, pulse 86, temperature 98 F (36.7 C), temperature source Oral, resp. rate 16, height 5' (1.524 m), weight 42.5 kg, last menstrual period 02/13/2019, SpO2 100 %, not currently breastfeeding.  Patient Vitals for the past 24 hrs:  BP Temp Temp src Pulse Resp SpO2 Height Weight  04/08/19 1240 109/76 - - 86 - - - -  04/08/19 0749 101/63 98 F (36.7 C) Oral (!) 59 16 100 % - -  04/08/19 0738 - - - - - - 5' (1.524 m) 42.5 kg  04/08/19 0713 115/86 98.4 F (36.9 C) Oral - 16 100 % - -  04/08/19 0711 - - - - - - 5' (1.524 m) 45.4 kg   Physical Exam  Nursing note and vitals  reviewed. Constitutional: She is oriented to person, place, and time. She appears well-developed and well-nourished.  Cardiovascular: Normal rate.  Respiratory: Effort normal.  GI: Soft. Bowel sounds are normal. She exhibits no distension. There is no abdominal tenderness. There is no rebound, no guarding and no CVA tenderness.  Neurological: She is alert and oriented to person, place, and time.  Skin: Skin is warm and dry.  Psychiatric: She has a normal mood and affect. Her behavior is normal. Judgment and thought content normal.   Results for orders placed or performed during the hospital encounter of 04/08/19 (from the past 24 hour(s))  Urinalysis, Routine w reflex microscopic     Status: Abnormal   Collection Time: 04/08/19 11:20 AM  Result Value Ref Range   Color, Urine YELLOW YELLOW   APPearance CLEAR CLEAR   Specific Gravity, Urine 1.020 1.005 - 1.030   pH 6.0 5.0 - 8.0   Glucose, UA NEGATIVE NEGATIVE mg/dL   Hgb urine dipstick NEGATIVE NEGATIVE   Bilirubin Urine NEGATIVE NEGATIVE   Ketones, ur 80 (A) NEGATIVE mg/dL   Protein, ur NEGATIVE NEGATIVE mg/dL   Nitrite NEGATIVE NEGATIVE   Leukocytes,Ua SMALL (A) NEGATIVE   RBC / HPF 0-5 0 - 5 RBC/hpf   WBC, UA 21-50 0 - 5 WBC/hpf   Bacteria, UA NONE SEEN NONE SEEN   Squamous Epithelial / LPF 0-5 0 - 5   Mucus PRESENT     No results found.  MAU Course/MDM  Procedures  Report given to N. Londin Antone who assumes care of patient at this time  Clayton BiblesSamantha Weinhold, [place]CNM 04/08/19 12:46 PM   -N/V x1day -less urge to void, voiding normal amounts since last night, pt reports she has been dehydrated recently with dark orange/amber colored urine at home -provider checked on pt around 1000; significant delay in obtaining phenergan bag from pharmacy, pt has only had about 400mL infused; pt denies any further vomiting but states she is still feeling nauseous; pt also states she is having a 6/10 HA and does have a hx of migraines, which this  feels like; pt declines rectal administration of Tylenol, electing to wait and see if fluids help; pt still declines urge to urinate, will consider 2nd bag LR -1L LR + 25mg  phenergan ordered by Clayton BiblesSamantha Weinhold, CNM -after administration, pt reports N/V resolved -after administration, pt reports HA is now 7/10 -pt still unable to urinate after 1L LR, 2nd L LR ordered, pt now requesting oral Tylenol -PO challenge  successful, pt tolerated food, drink, Tylenol -pt able to urinate during 2nd bag of LR -UA: 80ketones/sm leuks (sample taken prior to 2nd bag of LR), SG 1.020, urine culture sent -HA now 0/10 after Tylenol -pt discharged to home in stable condition  Orders Placed This Encounter  Procedures  . Culture, OB Urine    Standing Status:   Standing    Number of Occurrences:   1  . Urinalysis, Routine w reflex microscopic    Standing Status:   Standing    Number of Occurrences:   1  . Insert peripheral IV    Standing Status:   Standing    Number of Occurrences:   1  . Discharge patient    Order Specific Question:   Discharge disposition    Answer:   01-Home or Self Care [1]    Order Specific Question:   Discharge patient date    Answer:   04/08/2019   Meds ordered this encounter  Medications  . promethazine (PHENERGAN) 25 mg in lactated ringers 1,000 mL infusion  . lactated ringers bolus 1,000 mL  . acetaminophen (TYLENOL) tablet 1,000 mg  . promethazine (PHENERGAN) 12.5 MG tablet    Sig: Take 1 tablet (12.5 mg total) by mouth every 6 (six) hours as needed for nausea or vomiting.    Dispense:  30 tablet    Refill:  0    Order Specific Question:   Supervising Provider    Answer:   Alysia PennaERVIN, MICHAEL L [[id number]]   Assessment and Plan   1. Nausea and vomiting in pregnancy   2. [redacted] weeks gestation of pregnancy   3. Pregnancy headache in first trimester    Allergies as of 04/08/2019      Reactions   Penicillins Nausea And Vomiting   Has patient had a PCN reaction causing immediate  rash, facial/tongue/throat swelling, SOB or lightheadedness with hypotension: No Has patient had a PCN reaction causing severe rash involving mucus membranes or skin necrosis: No Has patient had a PCN reaction that required hospitalization: No Has patient had a PCN reaction occurring within the last 10 years: No If all of the above answers are "NO", then may proceed with Cephalosporin use.      Medication List    TAKE these medications   promethazine 12.5 MG tablet Commonly known as: PHENERGAN Take 1 tablet (12.5 mg total) by mouth every 6 (six) hours as needed for nausea or vomiting.      -discussed appropriate hydration and urination in pregnancy -strict hyperemesis/return MAU precautions given -discussed appropriate expectations for N/V in pregnancy -discussed pharmacologic and non-pharmacologic treatments for N/V in pregnancy -RX given for phenergan -pt requests and is given work note for today, advised to discuss being out of work for longer periods with regular OB provider -pt discharged to home in stable condition  Rashee Marschall, Odie SeraNicole E, NP  12:46 PM 04/08/2019

## 2019-04-08 NOTE — Discharge Instructions (Signed)
Morning Sickness  Morning sickness is when a woman feels nauseous during pregnancy. This nauseous feeling may or may not come with vomiting. It often occurs in the morning, but it can be a problem at any time of day. Morning sickness is most common during the first trimester. In some cases, it may continue throughout pregnancy. Although morning sickness is unpleasant, it is usually harmless unless the woman develops severe and continual vomiting (hyperemesis gravidarum), a condition that requires more intense treatment. What are the causes? The exact cause of this condition is not known, but it seems to be related to normal hormonal changes that occur in pregnancy. What increases the risk? You are more likely to develop this condition if:  You experienced nausea or vomiting before your pregnancy.  You had morning sickness during a previous pregnancy.  You are pregnant with more than one baby, such as twins. What are the signs or symptoms? Symptoms of this condition include:  Nausea.  Vomiting. How is this diagnosed? This condition is usually diagnosed based on your signs and symptoms. How is this treated? In many cases, treatment is not needed for this condition. Making some changes to what you eat may help to control symptoms. Your health care provider may also prescribe or recommend:  Vitamin B6 supplements.  Anti-nausea medicines.  Ginger. Follow these instructions at home: Medicines  Take over-the-counter and prescription medicines only as told by your health care provider. Do not use any prescription, over-the-counter, or herbal medicines for morning sickness without first talking with your health care provider.  Taking multivitamins before getting pregnant can prevent or decrease the severity of morning sickness in most women. Eating and drinking  Eat a piece of dry toast or crackers before getting out of bed in the morning.  Eat 5 or 6 small meals a day.  Eat dry and  bland foods, such as rice or a baked potato. Foods that are high in carbohydrates are often helpful.  Avoid greasy, fatty, and spicy foods.  Have someone cook for you if the smell of any food causes nausea and vomiting.  If you feel nauseous after taking prenatal vitamins, take the vitamins at night or with a snack.  Snack on protein foods between meals if you are hungry. Nuts, yogurt, and cheese are good options.  Drink fluids throughout the day.  Try ginger ale made with real ginger, ginger tea made from fresh grated ginger, or ginger candies. General instructions  Do not use any products that contain nicotine or tobacco, such as cigarettes and e-cigarettes. If you need help quitting, ask your health care provider.  Get an air purifier to keep the air in your house free of odors.  Get plenty of fresh air.  Try to avoid odors that trigger your nausea.  Consider trying these methods to help relieve symptoms: ? Wearing an acupressure wristband. These wristbands are often worn for seasickness. ? Acupuncture. Contact a health care provider if:  Your home remedies are not working and you need medicine.  You feel dizzy or light-headed.  You are losing weight. Get help right away if:  You have persistent and uncontrolled nausea and vomiting.  You faint.  You have severe pain in your abdomen. Summary  Morning sickness is when a woman feels nauseous during pregnancy. This nauseous feeling may or may not come with vomiting.  Morning sickness is most common during the first trimester.  It often occurs in the morning, but it can be a problem at  any time of day. °· In many cases, treatment is not needed for this condition. Making some changes to what you eat may help to control symptoms. °This information is not intended to replace advice given to you by your health care provider. Make sure you discuss any questions you have with your health care provider. °Document Released:  11/23/2006 Document Revised: 11/04/2016 Document Reviewed: 11/04/2016 °Elsevier Interactive Patient Education © 2019 Else[employer] Inc. ° °                 Safe Medications in Pregnancy  ° ° °Acne: °Benzoyl Peroxide °Salicylic Acid ° °Backache/Headache: °Tylenol: 2 regular strength every 4 hours OR °             2 Extra strength every 6 hours ° °Colds/Coughs/Allergies: °Benadryl (alcohol free) 25 mg every 6 hours as needed °Breath right strips °Claritin °Cepacol throat lozenges °Chloraseptic throat spray °Cold-Eeze- up to three times per day °Cough drops, alcohol free °Flonase (by prescription only) °Guaifenesin °Mucinex °Robitussin DM (plain only, alcohol free) °Saline nasal spray/drops °Sudafed (pseudoephedrine) & Actifed ** use only after [redacted] weeks gestation and if you do not have high blood pressure °Tylenol °Vicks Vaporub °Zinc lozenges °Zyrtec  ° °Constipation: °Colace °Ducolax suppositories °Fleet enema °Glycerin suppositories °Metamucil °Milk of magnesia °Miralax °Senokot °Smooth move tea ° °Diarrhea: °Kaopectate °Imodium A-D ° °*NO pepto Bismol ° °Hemorrhoids: °Anusol °Anusol HC °Preparation H °Tucks ° °Indigestion: °Tums °Maalox °Mylanta °Zantac  °Pepcid ° °Insomnia: °Benadryl (alcohol free) 25mg every 6 hours as needed °Tylenol PM °Unisom, no Gelcaps ° °Leg Cramps: °Tums °MagGel ° °Nausea/Vomiting:  °Bonine °Dramamine °Emetrol °Ginger extract °Sea bands °Meclizine  °Nausea medication to take during pregnancy:  °Unisom (doxylamine succinate 25 mg tablets) Take one tablet daily at bedtime. If symptoms are not adequately controlled, the dose can be increased to a maximum recommended dose of two tablets daily (1/2 tablet in the morning, 1/2 tablet mid-afternoon and one at bedtime). °Vitamin B6 100mg tablets. Take one tablet twice a day (up to 200 mg per day). ° °Skin Rashes: °Aveeno products °Benadryl cream or 25mg every 6 hours as needed °Calamine Lotion °1% cortisone cream ° °Yeast infection: °Gyne-lotrimin  7 °Monistat 7 ° ° °**If taking multiple medications, please check labels to avoid duplicating the same active ingredients °**take medication as directed on the label °** Do not exceed 4000 mg of tylenol in 24 hours °**Do not take medications that contain aspirin or ibuprofen °

## 2019-04-09 LAB — CULTURE, OB URINE: Culture: <10000 — AB

## 2019-04-22 ENCOUNTER — Encounter (HOSPITAL_COMMUNITY): Payer: Self-pay

## 2019-04-22 ENCOUNTER — Inpatient Hospital Stay (HOSPITAL_COMMUNITY)
Admission: AD | Admit: 2019-04-22 | Discharge: 2019-04-22 | Disposition: A | Discharge status: Home / Self Care | Payer: Medicaid Other | Attending: Obstetrics and Gynecology | Admitting: Obstetrics and Gynecology

## 2019-04-22 ENCOUNTER — Inpatient Hospital Stay (HOSPITAL_COMMUNITY): Payer: Medicaid Other

## 2019-04-22 DIAGNOSIS — A599 Trichomoniasis, unspecified: Secondary | ICD-10-CM | POA: Diagnosis not present

## 2019-04-22 DIAGNOSIS — O98311 Other infections with a predominantly sexual mode of transmission complicating pregnancy, first trimester: Secondary | ICD-10-CM | POA: Insufficient documentation

## 2019-04-22 DIAGNOSIS — O98911 Unspecified maternal infectious and parasitic disease complicating pregnancy, first trimester: Secondary | ICD-10-CM

## 2019-04-22 DIAGNOSIS — R109 Unspecified abdominal pain: Secondary | ICD-10-CM | POA: Diagnosis present

## 2019-04-22 DIAGNOSIS — O26851 Spotting complicating pregnancy, first trimester: Secondary | ICD-10-CM | POA: Diagnosis not present

## 2019-04-22 DIAGNOSIS — Z880 Allergy status to penicillin: Secondary | ICD-10-CM | POA: Diagnosis not present

## 2019-04-22 DIAGNOSIS — Z3A08 8 weeks gestation of pregnancy: Secondary | ICD-10-CM | POA: Diagnosis not present

## 2019-04-22 DIAGNOSIS — Z3A09 9 weeks gestation of pregnancy: Secondary | ICD-10-CM | POA: Diagnosis not present

## 2019-04-22 DIAGNOSIS — O26891 Other specified pregnancy related conditions, first trimester: Secondary | ICD-10-CM | POA: Diagnosis not present

## 2019-04-22 DIAGNOSIS — A5901 Trichomonal vulvovaginitis: Secondary | ICD-10-CM | POA: Insufficient documentation

## 2019-04-22 LAB — CBC WITH DIFFERENTIAL/PLATELET
Abs Immature Granulocytes: 0.01 10*3/uL (ref 0.00–0.07)
Basophils Absolute: 0 10*3/uL (ref 0.0–0.1)
Basophils Relative: 1 %
Eosinophils Absolute: 0.1 10*3/uL (ref 0.0–0.5)
Eosinophils Relative: 3 %
HCT: 34.1 % — ABNORMAL LOW (ref 36.0–46.0)
Hemoglobin: 11.3 g/dL — ABNORMAL LOW (ref 12.0–15.0)
Immature Granulocytes: 0 %
Lymphocytes Relative: 36 %
Lymphs Abs: 1.5 10*3/uL (ref 0.7–4.0)
MCH: 27.9 pg (ref 26.0–34.0)
MCHC: 33.1 g/dL (ref 30.0–36.0)
MCV: 84.2 fL (ref 80.0–100.0)
Monocytes Absolute: 0.4 10*3/uL (ref 0.1–1.0)
Monocytes Relative: 10 %
Neutro Abs: 2.1 10*3/uL (ref 1.7–7.7)
Neutrophils Relative %: 50 %
Platelets: 212 10*3/uL (ref 150–400)
RBC: 4.05 MIL/uL (ref 3.87–5.11)
RDW: 13 % (ref 11.5–15.5)
WBC: 4.2 10*3/uL (ref 4.0–10.5)
nRBC: 0 % (ref 0.0–0.2)

## 2019-04-22 LAB — WET PREP, GENITAL
Sperm: NONE SEEN
Yeast Wet Prep HPF POC: NONE SEEN

## 2019-04-22 LAB — HCG, QUANTITATIVE, PREGNANCY: hCG, Beta Chain, Quant, S: 136750 m[IU]/mL — ABNORMAL HIGH (ref <5)

## 2019-04-22 MED ORDER — METRONIDAZOLE 500 MG PO TABS: 500.0000 mg | ORAL_TABLET | Freq: Two times a day (BID) | ORAL | 0 refills | Status: DC

## 2019-04-22 MED ORDER — METRONIDAZOLE 500 MG PO TABS
2000.0000 mg | ORAL_TABLET | Freq: Once | ORAL | Status: AC
  Administered 2019-04-22: 2000 mg via ORAL
  Filled 2019-04-22: qty 4

## 2019-04-22 NOTE — MAU Note (Signed)
Patient c/o right-sided abdominal pain w/ some spotting when wiping last night.  Saw more spotting this morning. Patient began cramping around 0500. Arrived via EMS.

## 2019-04-22 NOTE — Discharge Instructions (Signed)
Abdominal Pain During Pregnancy ° °Belly (abdominal) pain is common during pregnancy. There are many possible causes. Most of the time, it is not a serious problem. Other times, it can be a sign that something is wrong with the pregnancy. Always tell your doctor if you have belly pain. °Follow these instructions at home: °· Do not have sex or put anything in your vagina until your pain goes away completely. °· Get plenty of rest until your pain gets better. °· Drink enough fluid to keep your pee (urine) pale yellow. °· Take over-the-counter and prescription medicines only as told by your doctor. °· Keep all follow-up visits as told by your doctor. This is important. °Contact a doctor if: °· Your pain continues or gets worse after resting. °· You have lower belly pain that: °? Comes and goes at regular times. °? Spreads to your back. °? Feels like menstrual cramps. °· You have pain or burning when you pee (urinate). °Get help right away if: °· You have a fever or chills. °· You have vaginal bleeding. °· You are leaking fluid from your vagina. °· You are passing tissue from your vagina. °· You throw up (vomit) for more than 24 hours. °· You have watery poop (diarrhea) for more than 24 hours. °· Your baby is moving less than usual. °· You feel very weak or faint. °· You have shortness of breath. °· You have very bad pain in your upper belly. °Summary °· Belly (abdominal) pain is common during pregnancy. There are many possible causes. °· If you have belly pain during pregnancy, tell your doctor right away. °· Keep all follow-up visits as told by your doctor. This is important. °This information is not intended to replace advice given to you by your health care provider. Make sure you discuss any questions you have with your health care provider. °Document Released: 09/20/2009 Document Revised: 01/20/2019 Document Reviewed: 01/04/2017 °Elsevier Patient Education © 2020 Elsevier Inc. ° °

## 2019-04-22 NOTE — MAU Provider Note (Signed)
History     CSN: [id number]  Arrival date and time: 04/22/19 6440   First Provider Initiated Contact with Patient 04/22/19 [phone]      Chief Complaint  Patient presents with  . Abdominal Pain   HPI Margaret Cain is a 33 y.o. [id number] at [redacted]w[redacted]d who presents to MAU via EMS with chief complaint of abdominal pain and vaginal spotting. Patient reports mild lower abdominal cramping, new onset last night. Her pain worsened throughout the night. She then experienced new onset vaginal spotting around 0500 this morning. She rates her pain as 4/10 upon initial assessment in MAU. She denies aggravating or alleviating factors. She has not taken medication or tried other treatments for these complaints.  Patient denies heavy vaginal bleeding, abdominal tenderness, dysuria, fever or recent illness.  OB History    Gravida  9   Para  7   Term  6   Preterm  1   AB  1   Living  7     SAB  1   TAB  0   Ectopic  0   Multiple  0   Live Births  7           Past Medical History:  Diagnosis Date  . Anemia   . Anxiety   . Depression    ppd with first child  . Gestational diabetes    diet controlled  . Headache(784.0)   . Hx of chlamydia infection   . Positive GBS test 03/18/2017  . Pregnancy induced hypertension   . Preterm labor   . Trichomonosis   . Urinary tract infection   . 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 COLPOSCOPY      Family History  Problem Relation Age of Onset  . Asthma Son   . Asthma Paternal Aunt   . Anesthesia problems Neg Hx   . Hypotension Neg Hx   . Malignant hyperthermia Neg Hx   . Pseudochol deficiency Neg Hx   . Other Neg Hx   . Cancer Neg Hx   . Heart disease Neg Hx   . Stroke Neg Hx   . Hearing loss Neg Hx     Social History   Tobacco Use  . Smoking status: Never Smoker  . Smokeless tobacco: Never Used  Substance Use Topics  . Alcohol use: No  . Drug  use: No    Allergies:  Allergies  Allergen Reactions  . Penicillins Nausea And Vomiting    Has patient had a PCN reaction causing immediate rash, facial/tongue/throat swelling, SOB or lightheadedness with hypotension: No Has patient had a PCN reaction causing severe rash involving mucus membranes or skin necrosis: No Has patient had a PCN reaction that required hospitalization: No Has patient had a PCN reaction occurring within the last 10 years: No If all of the above answers are "NO", then may proceed with Cephalosporin use.     Medications Prior to Admission  Medication Sig Dispense Refill Last Dose  . promethazine (PHENERGAN) 12.5 MG tablet Take 1 tablet (12.5 mg total) by mouth every 6 (six) hours as needed for nausea or vomiting. 30 tablet 0 04/21/2019 at Unknown time    Review of Systems  Constitutional: Negative for chills, fatigue and fever.  Gastrointestinal: Positive for abdominal pain. Negative for nausea and vomiting.  Genitourinary: Positive for vaginal bleeding. Negative for difficulty urinating, dysuria and flank pain.  Neurological:  Negative for headaches.  All other systems reviewed and are negative.  Physical Exam   Blood pressure 95/60, pulse 72, temperature 98.6 F (37 C), temperature source Oral, resp. rate 18, height 5' (1.524 m), weight 42.2 kg, last menstrual period 02/13/2019, SpO2 100 %, not currently breastfeeding.  Physical Exam  Nursing note and vitals reviewed. Constitutional: She is oriented to person, place, and time. She appears well-developed and well-nourished.  Cardiovascular: Normal rate.  Respiratory: Effort normal.  GI: Soft. She exhibits no distension. There is no abdominal tenderness. There is no rebound and no guarding.  Genitourinary:    Vaginal discharge present.     Genitourinary Comments: Scant amount of pink-tinged discharge on swab collection   Neurological: She is alert and oriented to person, place, and time.  Skin: Skin is  warm and dry.  Psychiatric: She has a normal mood and affect. Her behavior is normal. Judgment and thought content normal.    MAU Course/MDM  Procedures  --Swabs collected via blind swab. No over bleeding noted at any time during evaluation in MAU --Patient unable to tolerate 2g Flagyl dose in MAU. Discussed CDC recommendation for 500mg  BID x 7 days.  Patient Vitals for the past 24 hrs:  BP Temp Temp src Pulse Resp SpO2 Height Weight  04/22/19 0737 103/64 97.9 F (36.6 C) Oral 80 19 100 % - -  04/22/19 0630 - - - - - 100 % - -  04/22/19 0626 95/60 - - 72 - - - -  04/22/19 0625 - - - - - 99 % - -  04/22/19 0620 - - - - - 99 % - -  04/22/19 0616 96/60 98.6 F (37 C) Oral 67 18 99 % 5' (1.524 m) 42.2 kg   Results for orders placed or performed during the hospital encounter of 04/22/19 (from the past 24 hour(s))  Wet prep, genital     Status: Abnormal   Collection Time: 04/22/19  6:27 AM  Result Value Ref Range   Yeast Wet Prep HPF POC NONE SEEN NONE SEEN   Trich, Wet Prep PRESENT (A) NONE SEEN   Clue Cells Wet Prep HPF POC PRESENT (A) NONE SEEN   WBC, Wet Prep HPF POC MANY (A) NONE SEEN   Sperm NONE SEEN   CBC with Differential/Platelet     Status: Abnormal   Collection Time: 04/22/19  6:38 AM  Result Value Ref Range   WBC 4.2 4.0 - 10.5 K/uL   RBC 4.05 3.87 - 5.11 MIL/uL   Hemoglobin 11.3 (L) 12.0 - 15.0 g/dL   HCT 95.634.1 (L) 21.336.0 - 08.646.0 %   MCV 84.2 80.0 - 100.0 fL   MCH 27.9 26.0 - 34.0 pg   MCHC 33.1 30.0 - 36.0 g/dL   RDW 57.813.0 46.911.5 - 62.915.5 %   Platelets 212 150 - 400 K/uL   nRBC 0.0 0.0 - 0.2 %   Neutrophils Relative % 50 %   Neutro Abs 2.1 1.7 - 7.7 K/uL   Lymphocytes Relative 36 %   Lymphs Abs 1.5 0.7 - 4.0 K/uL   Monocytes Relative 10 %   Monocytes Absolute 0.4 0.1 - 1.0 K/uL   Eosinophils Relative 3 %   Eosinophils Absolute 0.1 0.0 - 0.5 K/uL   Basophils Relative 1 %   Basophils Absolute 0.0 0.0 - 0.1 K/uL   Immature Granulocytes 0 %   Abs Immature  Granulocytes 0.01 0.00 - 0.07 K/uL   [place] Ob Comp Less 14 Wks  Result Date:  04/22/2019 CLINICAL DATA:  Onset right pelvic pain and spotting at 5 a.m. this morning in a pregnant patient. EXAM: OBSTETRIC <14 WK ULTRASOUND TECHNIQUE: Transabdominal ultrasound was performed for evaluation of the gestation as well as the maternal uterus and adnexal regions. COMPARISON:  None. FINDINGS: Intrauterine gestational sac: Single. Yolk sac:  Visualized. Embryo:  Visualized. Cardiac Activity: Detected. Heart Rate: 164 bpm CRL:   22.7 mm   8 w 6 d                  US EDC: 11/26/2019 Subchorionic hemorrhage:  None visualized. Maternal uterus/adnexae: Small corpus luteum cyst on the right is noted. IMPRESSION: Single living intrauterine pregnancy.  No acute finding. Electronically Signed   By: Drusilla Kannerhomas  Dalessio M.D.   On: 04/22/2019 07:27   Meds ordered this encounter  Medications  . metroNIDAZOLE (FLAGYL) tablet 2,000 mg  . metroNIDAZOLE (FLAGYL) 500 MG tablet    Sig: Take 1 tablet (500 mg total) by mouth 2 (two) times daily.    Dispense:  14 tablet    Refill:  0    Order Specific Question:   Supervising Provider    Answer:   Hermina StaggersERVIN, MICHAEL L [[id number]]   Assessment and Plan  --33 y.o. [id number] at 4939w6d  --Positive Trichomonas, treated in MAU, given paper rx for expedited partner treatment --Discharge home in stable condition  F/U: [facility]   C , CNM 04/22/2019, 7:57 AM

## 2019-04-23 LAB — GC/CHLAMYDIA PROBE AMP (~~LOC~~) NOT AT ARMC
Chlamydia: NEGATIVE
Neisseria Gonorrhea: NEGATIVE

## 2019-05-12 ENCOUNTER — Other Ambulatory Visit (HOSPITAL_COMMUNITY): Payer: Self-pay | Admitting: Nurse Practitioner

## 2019-05-12 DIAGNOSIS — Z369 Encounter for antenatal screening, unspecified: Secondary | ICD-10-CM

## 2019-05-12 LAB — OB RESULTS CONSOLE RPR: RPR: NONREACTIVE

## 2019-05-12 LAB — OB RESULTS CONSOLE GC/CHLAMYDIA: Gonorrhea: NEGATIVE

## 2019-05-12 LAB — OB RESULTS CONSOLE ANTIBODY SCREEN: Antibody Screen: NEGATIVE

## 2019-05-12 LAB — AMB REFERRAL TO OB-GYN: Drug Screen, Urine: NEGATIVE

## 2019-05-13 ENCOUNTER — Other Ambulatory Visit: Payer: Self-pay | Admitting: Nurse Practitioner

## 2019-05-13 DIAGNOSIS — N6320 Unspecified lump in the left breast, unspecified quadrant: Secondary | ICD-10-CM

## 2019-05-20 ENCOUNTER — Other Ambulatory Visit: Payer: Self-pay

## 2019-05-20 ENCOUNTER — Ambulatory Visit
Admission: RE | Admit: 2019-05-20 | Discharge: 2019-05-20 | Disposition: A | Discharge status: Home / Self Care | Payer: Medicaid Other | Source: Ambulatory Visit | Attending: Nurse Practitioner | Admitting: Nurse Practitioner

## 2019-05-20 DIAGNOSIS — N6320 Unspecified lump in the left breast, unspecified quadrant: Secondary | ICD-10-CM

## 2019-05-22 ENCOUNTER — Ambulatory Visit (HOSPITAL_COMMUNITY): Payer: Medicaid Other | Admitting: *Deleted

## 2019-05-22 ENCOUNTER — Other Ambulatory Visit: Payer: Self-pay

## 2019-05-22 ENCOUNTER — Ambulatory Visit (HOSPITAL_COMMUNITY): Payer: Medicaid Other

## 2019-05-22 ENCOUNTER — Encounter: Payer: Self-pay | Admitting: *Deleted

## 2019-05-22 ENCOUNTER — Ambulatory Visit (INDEPENDENT_AMBULATORY_CARE_PROVIDER_SITE_OTHER): Payer: Medicaid Other | Admitting: Obstetrics & Gynecology

## 2019-05-22 ENCOUNTER — Other Ambulatory Visit (HOSPITAL_COMMUNITY)
Admission: RE | Admit: 2019-05-22 | Discharge: 2019-05-22 | Disposition: A | Discharge status: Home / Self Care | Payer: Medicaid Other | Source: Ambulatory Visit | Attending: Obstetrics & Gynecology | Admitting: Obstetrics & Gynecology

## 2019-05-22 ENCOUNTER — Ambulatory Visit (HOSPITAL_COMMUNITY)
Admission: RE | Admit: 2019-05-22 | Discharge: 2019-05-22 | Disposition: A | Discharge status: Home / Self Care | Payer: Medicaid Other | Source: Ambulatory Visit | Attending: Obstetrics and Gynecology | Admitting: Obstetrics and Gynecology

## 2019-05-22 ENCOUNTER — Other Ambulatory Visit (HOSPITAL_COMMUNITY): Payer: Self-pay | Admitting: *Deleted

## 2019-05-22 VITALS — BP 108/66 | HR 82 | Wt 95.0 lb

## 2019-05-22 VITALS — BP 98/56 | HR 71 | Temp 98.4°F

## 2019-05-22 DIAGNOSIS — O0990 Supervision of high risk pregnancy, unspecified, unspecified trimester: Secondary | ICD-10-CM

## 2019-05-22 DIAGNOSIS — Z369 Encounter for antenatal screening, unspecified: Secondary | ICD-10-CM

## 2019-05-22 DIAGNOSIS — Z3682 Encounter for antenatal screening for nuchal translucency: Secondary | ICD-10-CM | POA: Insufficient documentation

## 2019-05-22 DIAGNOSIS — Z3492 Encounter for supervision of normal pregnancy, unspecified, second trimester: Secondary | ICD-10-CM

## 2019-05-22 DIAGNOSIS — A599 Trichomoniasis, unspecified: Secondary | ICD-10-CM

## 2019-05-22 DIAGNOSIS — Z3A13 13 weeks gestation of pregnancy: Secondary | ICD-10-CM

## 2019-05-22 DIAGNOSIS — R87610 Atypical squamous cells of undetermined significance on cytologic smear of cervix (ASC-US): Secondary | ICD-10-CM

## 2019-05-22 DIAGNOSIS — O09291 Supervision of pregnancy with other poor reproductive or obstetric history, first trimester: Secondary | ICD-10-CM | POA: Diagnosis not present

## 2019-05-22 DIAGNOSIS — IMO0002 Reserved for concepts with insufficient information to code with codable children: Secondary | ICD-10-CM

## 2019-05-22 DIAGNOSIS — O09211 Supervision of pregnancy with history of pre-term labor, first trimester: Secondary | ICD-10-CM | POA: Diagnosis not present

## 2019-05-22 DIAGNOSIS — Z3490 Encounter for supervision of normal pregnancy, unspecified, unspecified trimester: Secondary | ICD-10-CM | POA: Insufficient documentation

## 2019-05-22 DIAGNOSIS — Z0489 Encounter for examination and observation for other specified reasons: Secondary | ICD-10-CM

## 2019-05-22 DIAGNOSIS — O0991 Supervision of high risk pregnancy, unspecified, first trimester: Secondary | ICD-10-CM

## 2019-05-22 LAB — AMB REFERRAL TO OB-GYN: Glucose, 1 hour: 120

## 2019-05-22 MED ORDER — BLOOD PRESSURE KIT DEVI: 1.0000 | Freq: Every day | 0 refills | Status: DC

## 2019-05-22 NOTE — Patient Instructions (Signed)

## 2019-05-22 NOTE — Progress Notes (Signed)
Patient and partner tested for trich. TOC today Hypertension last pregnancy 78yr ago Sending bp cuff through [place] Added to babyrx

## 2019-05-22 NOTE — Progress Notes (Signed)
Subjective:    Margaret Cain is a [id number] 3934w1d being seen today for her first obstetrical visit.  Her obstetrical history is significant for multiparity. Patient does intend to breast feed. Pregnancy history fully reviewed.  Patient reports no complaints.  Vitals:   05/22/19 1316  BP: 108/66  Pulse: 82  Weight: 95 lb (43.1 kg)    HISTORY: OB History  Gravida Para Term Preterm AB Living  9 7 6 1 1 7   SAB TAB Ectopic Multiple Live Births  1 0 0 0 7    # Outcome Date GA Lbr Len/2nd Weight Sex Delivery Anes PTL Lv  9 Current           8 Term 11/07/17 6365w6d 06:29 / 00:17 8 lb 0.8 oz (3.65 kg) F Vag-Spont EPI  LIV  7 Term 03/19/16 326w1d 01:45 / 00:07 7 lb 5.1 oz (3.32 kg) M Vag-Spont EPI  LIV     Birth Comments: none  6 Term 09/08/14 4631w4d 03:40 / 00:07 6 lb 15.3 oz (3.155 kg) F Vag-Spont None  LIV  5 Term 09/20/13 5898w5d 04:10 / 00:31 7 lb (3.175 kg) F Vag-Spont EPI  LIV  4 Preterm 07/17/12 954w3d 19:05 / 00:12 4 lb 5.7 oz (1.975 kg) M Vag-Spont EPI  LIV  3 SAB 10/05/11          2 Term 10/15/10 695w0d  7 lb 2 oz (3.232 kg) M Vag-Spont   LIV  1 Term 2005 7465w0d  5 lb 7 oz (2.466 kg) M Vag-Spont   LIV   Past Medical History:  Diagnosis Date  . Anemia   . Anxiety   . Depression    ppd with first child  . Gestational diabetes    diet controlled  . Headache(784.0)   . Hx of chlamydia infection   . Positive GBS test 03/18/2017  . Pregnancy induced hypertension   . Preterm labor   . Trichomonosis   . Urinary tract infection   . Vaginal Pap smear, abnormal    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   Past Surgical History:  Procedure Laterality Date  . COLPOSCOPY     Family History  Problem Relation Age of Onset  . Asthma Son   . Asthma Paternal Aunt   . Anesthesia problems Neg Hx   . Hypotension Neg Hx   . Malignant hyperthermia Neg Hx   . Pseudochol deficiency Neg Hx   . Other Neg Hx   . Cancer Neg Hx   . Heart disease Neg Hx   . Stroke Neg Hx   . Hearing loss Neg Hx       Exam    Uterus:     Pelvic Exam:    Perineum: No Hemorrhoids   Vulva: normal   Vagina:     pH:    Cervix: no lesions   Adnexa: normal adnexa   Bony Pelvis: average  System: Breast:  normal appearance, no masses or tenderness   Skin: normal coloration and turgor, no rashes   Neurologic: oriented, normal mood   Extremities: normal strength, tone, and muscle mass   HEENT PERRLA, extra ocular movement intact, sclera clear, anicteric and neck supple with midline trachea   Mouth/Teeth mucous membranes moist, pharynx normal without lesions and dental hygiene good   Neck supple and no masses   Cardiovascular: regular rate and rhythm, no murmurs or gallops   Respiratory:  appears well, vitals normal, no respiratory distress, acyanotic, normal RR, chest clear, no wheezing, crepitations, rhonchi, normal  symmetric air entry   Abdomen: soft, non-tender; bowel sounds normal; no masses,  no organomegaly   Urinary: urethral meatus normal      Assessment:    Pregnancy: [id number] Patient Active Problem List   Diagnosis Date Noted  . Trichomonas infection 04/22/2019  . Migraine headache 06/08/2017  . Anemia affecting pregnancy, antepartum 02/17/2016  . ASCUS with positive high risk HPV cervical 10/21/2015  . Smoker 12/10/2014        Plan:     Initial labs drawn. Prenatal vitamins. Problem list reviewed and updated. Genetic Screening discussed Quad screen requested.  Ultrasound discussed; fetal survey: ordered.  Follow up in 7 weeks.in person 50% of 30 min visit spent on counseling and coordination of care.  Trichomonas TOC   Emeterio Reeve 05/22/2019

## 2019-05-23 LAB — OBSTETRIC PANEL, INCLUDING HIV
Antibody Screen: NEGATIVE
Basophils Absolute: 0 10*3/uL (ref 0.0–0.2)
Basos: 0 %
EOS (ABSOLUTE): 0.2 10*3/uL (ref 0.0–0.4)
Eos: 3 %
HIV Screen 4th Generation wRfx: NONREACTIVE
Hematocrit: 34.3 % (ref 34.0–46.6)
Hemoglobin: 11.7 g/dL (ref 11.1–15.9)
Hepatitis B Surface Ag: NEGATIVE
Immature Grans (Abs): 0 10*3/uL (ref 0.0–0.1)
Immature Granulocytes: 0 %
Lymphocytes Absolute: 1.8 10*3/uL (ref 0.7–3.1)
Lymphs: 24 %
MCH: 28.3 pg (ref 26.6–33.0)
MCHC: 34.1 g/dL (ref 31.5–35.7)
MCV: 83 fL (ref 79–97)
Monocytes Absolute: 0.5 10*3/uL (ref 0.1–0.9)
Monocytes: 6 %
Neutrophils Absolute: 5 10*3/uL (ref 1.4–7.0)
Neutrophils: 67 %
Platelets: 240 10*3/uL (ref 150–450)
RBC: 4.13 x10E6/uL (ref 3.77–5.28)
RDW: 13.4 % (ref 11.7–15.4)
RPR Ser Ql: NONREACTIVE
Rh Factor: POSITIVE
Rubella Antibodies, IGG: 3.1 index (ref >0.99)
WBC: 7.5 10*3/uL (ref 3.4–10.8)

## 2019-05-23 LAB — URINE CULTURE

## 2019-05-26 LAB — FIRST TRIMESTER SCREEN W/NT
CRL: 71.2 mm
DIA MoM: 1.02
DIA Value: 296.9 pg/mL
Gest Age-Collect: 13.1 weeks
Maternal Age At EDD: 32.4 yr
Nuchal Translucency MoM: 0.95
Nuchal Translucency: 1.5 mm
Number of Fetuses: 1
PAPP-A MoM: 0.98
PAPP-A Value: 2134.9 ng/mL
Test Results:: NEGATIVE
Weight: 96 [lb_av]
hCG MoM: 1.87
hCG Value: 210.6 IU/mL

## 2019-05-26 LAB — CERVICOVAGINAL ANCILLARY ONLY
Bacterial vaginitis: NEGATIVE
Candida vaginitis: NEGATIVE
Chlamydia: NEGATIVE
Neisseria Gonorrhea: NEGATIVE
Trichomonas: NEGATIVE

## 2019-05-27 ENCOUNTER — Telehealth (HOSPITAL_COMMUNITY): Payer: Self-pay | Admitting: Genetic Counselor

## 2019-05-27 NOTE — Telephone Encounter (Signed)
LVM for Ms. Ekstrom re: good news about screening results. Requested a call back to my direct line to discuss these in more detail, as no identifiers were provided in voicemail message.  Buelah Manis, MS Genetic Counselor

## 2019-05-28 ENCOUNTER — Telehealth (HOSPITAL_COMMUNITY): Payer: Self-pay | Admitting: Genetic Counselor

## 2019-05-28 NOTE — Telephone Encounter (Signed)
Margaret Cain returned my call from yesterday to discuss her negative first trimester screen results. We reviewed that the risk for her pregnancy to be affected by Down syndrome decreased from her 1 in 494 age-related risk to 1 in 3200, and the risk for trisomy 18 decreased from her 1 in 967 age-related risk to less than 1 in 10,000 based on the results of this screen. Margaret Cain was reminded that while this screen significantly reduces the likelihood of the pregnancy being affected by trisomy 18 or trisomy 49, it cannot be considered diagnostic. Diagnostic testing via [facility] or amniocentesis is available should she be interested in pursuing this. We also reviewed that first trimester screening does not screen for open neural tube defects such as spina bifida, so her doctor should order AFP screening around 16-18 weeks to screen for this. Margaret Cain confirmed that she had no questions about these results.  Buelah Manis, MS Genetic Counselor

## 2019-06-04 ENCOUNTER — Telehealth: Payer: Self-pay

## 2019-06-04 DIAGNOSIS — Z3492 Encounter for supervision of normal pregnancy, unspecified, second trimester: Secondary | ICD-10-CM

## 2019-06-04 NOTE — Telephone Encounter (Signed)
Notified pt that we have her BP cuff from [place] if she could please come the office to pick up.  Pt stated that she will be able to come in on 06/05/19 or 06/09/19 to pick up cuff.

## 2019-06-05 ENCOUNTER — Encounter: Payer: Self-pay | Admitting: *Deleted

## 2019-06-09 ENCOUNTER — Other Ambulatory Visit: Payer: Self-pay

## 2019-06-09 NOTE — Progress Notes (Signed)
Pt came in today 06/09/19 to pick up BP cuff.    Mel Almond, RN

## 2019-07-08 ENCOUNTER — Telehealth: Payer: Self-pay | Admitting: Family Medicine

## 2019-07-08 NOTE — Telephone Encounter (Signed)
Patient called in stating she needs to speak to someone about her ear pain. Patient stated it has been going on for about 2 weeks now has not gotten any better and it hurts. Spoke to Crystal about this concerns and she stated the patient should go to urgent care. Patient instructed that she would have to visit urgent care. Patient verbalized understanding.

## 2019-07-09 ENCOUNTER — Telehealth: Payer: Self-pay | Admitting: Obstetrics & Gynecology

## 2019-07-09 NOTE — Telephone Encounter (Signed)
Spoke to patient about her appointment on 9/24 @ 9:15. Patient instructed to wear a face mask for the entire appointment and no visitors are allowed with her during the visit. Patient screened for covid symptoms and denied having any

## 2019-07-10 ENCOUNTER — Other Ambulatory Visit (HOSPITAL_COMMUNITY): Payer: Self-pay | Admitting: Obstetrics & Gynecology

## 2019-07-10 ENCOUNTER — Ambulatory Visit (HOSPITAL_COMMUNITY)
Admission: RE | Admit: 2019-07-10 | Discharge: 2019-07-10 | Disposition: A | Discharge status: Home / Self Care | Payer: Medicaid Other | Source: Ambulatory Visit | Attending: Obstetrics and Gynecology | Admitting: Obstetrics and Gynecology

## 2019-07-10 ENCOUNTER — Encounter (HOSPITAL_COMMUNITY): Payer: Self-pay

## 2019-07-10 ENCOUNTER — Other Ambulatory Visit: Payer: Self-pay

## 2019-07-10 ENCOUNTER — Other Ambulatory Visit (HOSPITAL_COMMUNITY)
Admission: RE | Admit: 2019-07-10 | Discharge: 2019-07-10 | Disposition: A | Discharge status: Home / Self Care | Payer: Medicaid Other | Source: Ambulatory Visit | Attending: Obstetrics & Gynecology | Admitting: Obstetrics & Gynecology

## 2019-07-10 ENCOUNTER — Ambulatory Visit (INDEPENDENT_AMBULATORY_CARE_PROVIDER_SITE_OTHER): Payer: Medicaid Other | Admitting: Obstetrics & Gynecology

## 2019-07-10 ENCOUNTER — Ambulatory Visit (HOSPITAL_COMMUNITY): Payer: Medicaid Other | Admitting: *Deleted

## 2019-07-10 VITALS — BP 100/69 | HR 77 | Wt 106.1 lb

## 2019-07-10 VITALS — Temp 98.3°F

## 2019-07-10 DIAGNOSIS — O09292 Supervision of pregnancy with other poor reproductive or obstetric history, second trimester: Secondary | ICD-10-CM

## 2019-07-10 DIAGNOSIS — O98312 Other infections with a predominantly sexual mode of transmission complicating pregnancy, second trimester: Secondary | ICD-10-CM

## 2019-07-10 DIAGNOSIS — Z23 Encounter for immunization: Secondary | ICD-10-CM

## 2019-07-10 DIAGNOSIS — Z0489 Encounter for examination and observation for other specified reasons: Secondary | ICD-10-CM | POA: Insufficient documentation

## 2019-07-10 DIAGNOSIS — Z3A20 20 weeks gestation of pregnancy: Secondary | ICD-10-CM | POA: Diagnosis not present

## 2019-07-10 DIAGNOSIS — O09212 Supervision of pregnancy with history of pre-term labor, second trimester: Secondary | ICD-10-CM

## 2019-07-10 DIAGNOSIS — A599 Trichomoniasis, unspecified: Secondary | ICD-10-CM

## 2019-07-10 DIAGNOSIS — O0990 Supervision of high risk pregnancy, unspecified, unspecified trimester: Secondary | ICD-10-CM | POA: Insufficient documentation

## 2019-07-10 DIAGNOSIS — IMO0002 Reserved for concepts with insufficient information to code with codable children: Secondary | ICD-10-CM

## 2019-07-10 DIAGNOSIS — Z3492 Encounter for supervision of normal pregnancy, unspecified, second trimester: Secondary | ICD-10-CM

## 2019-07-10 NOTE — Progress Notes (Signed)
Complains of Ear Pain. Requesting ear drops

## 2019-07-10 NOTE — Progress Notes (Signed)
   PRENATAL VISIT NOTE  Subjective:  Margaret Cain is a 33 y.o. L2G4010 at [redacted]w[redacted]d being seen today for ongoing prenatal care.  She is currently monitored for the following issues for this low-risk pregnancy and has Smoker; ASCUS with positive high risk HPV cervical; Anemia affecting pregnancy, antepartum; Migraine headache; Trichomonas infection; and Supervision of low-risk pregnancy on their problem list.  Patient reports her ears feel "clogged up"..  Contractions: Not present. Vag. Bleeding: None.  Movement: Present. Denies leaking of fluid.   The following portions of the patient's history were reviewed and updated as appropriate: allergies, current medications, past family history, past medical history, past social history, past surgical history and problem list.   Objective:   Vitals:   07/10/19 0855  BP: 100/69  Pulse: 77  Weight: 106 lb 1.6 oz (48.1 kg)    Fetal Status: Fetal Heart Rate (bpm): 148   Movement: Present     General:  Alert, oriented and cooperative. Patient is in no acute distress.  Skin: Skin is warm and dry. No rash noted.   Cardiovascular: Normal heart rate noted  Respiratory: Normal respiratory effort, no problems with respiration noted  Abdomen: Soft, gravid, appropriate for gestational age.  Pain/Pressure: Present     Pelvic: Cervical exam deferred        Extremities: Normal range of motion.  Edema: None  Mental Status: Normal mood and affect. Normal behavior. Normal judgment and thought content.   Assessment and Plan:  Pregnancy: [id number] at [redacted]w[redacted]d 1. Encounter for supervision of low-risk pregnancy in second trimester - anatomy u/s today - Flu Vaccine QUAD 36+ mos IM  2. Trichomonas infection  - Cervicovaginal ancillary only( [place])  3. Ear stuffiness- I rec'd sudafed  Preterm labor symptoms and general obstetric precautions including but not limited to vaginal bleeding, contractions, leaking of fluid and fetal movement were reviewed in  detail with the patient. Please refer to After Visit Summary for other counseling recommendations.   No follow-ups on file.  Future Appointments  Date Time Provider [place]  07/10/2019 10:00 AM [place] [facility]  07/10/2019 10:00 AM [facility] [place] 3 [facility]    Emily Filbert, MD

## 2019-07-11 ENCOUNTER — Other Ambulatory Visit (HOSPITAL_COMMUNITY): Payer: Self-pay | Admitting: *Deleted

## 2019-07-11 DIAGNOSIS — Z8759 Personal history of other complications of pregnancy, childbirth and the puerperium: Secondary | ICD-10-CM

## 2019-07-11 LAB — CERVICOVAGINAL ANCILLARY ONLY
Bacterial Vaginitis (gardnerella): POSITIVE — AB
Candida Glabrata: NEGATIVE
Candida Vaginitis: NEGATIVE
Molecular Disclaimer: NEGATIVE
Molecular Disclaimer: NEGATIVE
Molecular Disclaimer: NEGATIVE
Molecular Disclaimer: NORMAL
Trichomonas: NEGATIVE

## 2019-07-12 LAB — CERVICOVAGINAL ANCILLARY ONLY
Chlamydia: NEGATIVE
Neisseria Gonorrhea: NEGATIVE

## 2019-07-14 ENCOUNTER — Other Ambulatory Visit: Payer: Self-pay | Admitting: Obstetrics & Gynecology

## 2019-07-14 MED ORDER — METRONIDAZOLE 500 MG PO TABS: 500.0000 mg | ORAL_TABLET | Freq: Two times a day (BID) | ORAL | 0 refills | Status: DC

## 2019-07-14 NOTE — Progress Notes (Signed)
Flagyl prescribed for bv Patient notified via Mychart message.

## 2019-07-14 NOTE — Progress Notes (Signed)
Flagyl fo rbv 

## 2019-08-07 ENCOUNTER — Telehealth (INDEPENDENT_AMBULATORY_CARE_PROVIDER_SITE_OTHER): Payer: Medicaid Other | Admitting: Obstetrics & Gynecology

## 2019-08-07 ENCOUNTER — Other Ambulatory Visit: Payer: Self-pay

## 2019-08-07 VITALS — BP 123/77 | HR 74

## 2019-08-07 DIAGNOSIS — O99012 Anemia complicating pregnancy, second trimester: Secondary | ICD-10-CM

## 2019-08-07 DIAGNOSIS — Z3490 Encounter for supervision of normal pregnancy, unspecified, unspecified trimester: Secondary | ICD-10-CM

## 2019-08-07 DIAGNOSIS — Z3A24 24 weeks gestation of pregnancy: Secondary | ICD-10-CM

## 2019-08-07 DIAGNOSIS — F17200 Nicotine dependence, unspecified, uncomplicated: Secondary | ICD-10-CM

## 2019-08-07 DIAGNOSIS — R87810 Cervical high risk human papillomavirus (HPV) DNA test positive: Secondary | ICD-10-CM

## 2019-08-07 DIAGNOSIS — O99019 Anemia complicating pregnancy, unspecified trimester: Secondary | ICD-10-CM

## 2019-08-07 DIAGNOSIS — R87610 Atypical squamous cells of undetermined significance on cytologic smear of cervix (ASC-US): Secondary | ICD-10-CM

## 2019-08-07 DIAGNOSIS — G43019 Migraine without aura, intractable, without status migrainosus: Secondary | ICD-10-CM

## 2019-08-07 MED ORDER — BUTALBITAL-APAP-CAFFEINE 50-325-40 MG PO CAPS: 1.0000 | ORAL_CAPSULE | Freq: Four times a day (QID) | ORAL | 3 refills | Status: DC | PRN

## 2019-08-07 MED ORDER — FERROUS SULFATE 325 (65 FE) MG PO TABS: 325.0000 mg | ORAL_TABLET | Freq: Two times a day (BID) | ORAL | 1 refills | Status: DC

## 2019-08-07 NOTE — Progress Notes (Signed)
TELEHEALTH OBSTETRICS PRENATAL VIRTUAL VIDEO VISIT ENCOUNTER NOTE  Provider location: [facility] for [facility] [place]   I connected with Margaret Cain on 08/07/19 at  3:35 PM EDT by MyChart Video Encounter at home and verified that I am speaking with the correct person using two identifiers.   I discussed the limitations, risks, security and privacy concerns of performing an evaluation and management service virtually and the availability of in person appointments. I also discussed with the patient that there may be a patient responsible charge related to this service. The patient expressed understanding and agreed to proceed. Subjective:  Margaret Cain is a 33 y.o. [id number] at [redacted]w[redacted]d being seen today for ongoing prenatal care.  She is currently monitored for the following issues for this low-risk pregnancy and has Smoker; ASCUS with positive high risk HPV cervical; Anemia affecting pregnancy, antepartum; Migraine headache; Trichomonas infection; and Supervision of low-risk pregnancy on their problem list.  Patient reports no complaints.  Contractions: Irritability. Vag. Bleeding: None.  Movement: Present. Denies any leaking of fluid.   The following portions of the patient's history were reviewed and updated as appropriate: allergies, current medications, past family history, past medical history, past social history, past surgical history and problem list.   Objective:   Vitals:   08/07/19 1536  BP: 123/77  Pulse: 74    Fetal Status:     Movement: Present     General:  Alert, oriented and cooperative. Patient is in no acute distress.  Respiratory: Normal respiratory effort, no problems with respiration noted  Mental Status: Normal mood and affect. Normal behavior. Normal judgment and thought content.  Rest of physical exam deferred due to type of encounter  Imaging: [place] Mfm Ob Detail +14 Wk  Result Date: 07/11/2019  ----------------------------------------------------------------------  OBSTETRICS REPORT                       (Signed Final 07/11/2019 04:37 pm) ---------------------------------------------------------------------- Patient Info  ID #:       [id number]                          D.O.B.:  1986-04-01 (33 yrs)  Name:       Margaret Cain [facility]                 Visit Date: 07/10/2019 10:11 am ---------------------------------------------------------------------- Performed By  Performed By:     Emeline Darling BS,      Ref. Address:     [address]                    RDMS                                                             [place]  [phone]  Attending:        Lin Landsmanorenthian Booker      [facility]                    MD                                       [facility]  Referred By:      Alberteen SpindleASHLEY C                    CARSON NP ---------------------------------------------------------------------- Orders   #  Description                          Code         Ordered By   1  US MFM OB DETAIL +14 WK              76811.01     [facility]  ----------------------------------------------------------------------   #  Order #                    Accession #                 Episode #   1  [id number]                  [id number]                  [id number]  ---------------------------------------------------------------------- Indications   Poor obstetric history: Previous preterm       O09.219   delivery, @ 35 wks   Poor obstetric history: Previous               O09.299   preeclampsia / eclampsia/gestational HTN   Poor obstetric history: Previous gestational   O09.299   diabetes   Poor obstetrical history: Previous PROM        O09.299   Encounter for antenatal screening for          Z36.3   malformations   [redacted] weeks gestation of pregnancy                Z3A.20   ---------------------------------------------------------------------- Vital Signs                                                 Height:        5'0" ---------------------------------------------------------------------- Fetal Evaluation  Num Of Fetuses:         1  Fetal Heart Rate(bpm):  161  Cardiac Activity:       Observed  Presentation:           Cephalic  Placenta:               Anterior  P. Cord Insertion:      Visualized  Amniotic Fluid  AFI FV:      Within normal limits                              Largest Pocket(cm)  7.1 ---------------------------------------------------------------------- Biometry  BPD:      49.5  mm     G. Age:  21w 0d         82  %    CI:        76.58   %    70 - 86                                                          FL/HC:      19.1   %    16.8 - 19.8  HC:      179.2  mm     G. Age:  20w 3d         52  %    HC/AC:      1.04        1.09 - 1.39  AC:      172.1  mm     G. Age:  22w 1d         94  %    FL/BPD:     69.1   %  FL:       34.2  mm     G. Age:  20w 5d         64  %    FL/AC:      19.9   %    20 - 24  Est. FW:     421  gm    0 lb 15 oz      97  % ---------------------------------------------------------------------- OB History  Gravidity:    9         Term:   6        Prem:   1        SAB:   1  TOP:          0       Ectopic:  0        Living: 7 ---------------------------------------------------------------------- Gestational Age  LMP:           21w 0d        Date:  02/13/19                 EDD:   11/20/19  U/S Today:     21w 1d                                        EDD:   11/19/19  Best:          20w 1d     Det. ByMarcella Dubs         EDD:   11/26/19                                      (04/22/19) ---------------------------------------------------------------------- Anatomy  Cranium:               Appears normal         LVOT:                   Appears normal  Cavum:  Appears normal         Aortic Arch:            Appears  normal  Ventricles:            Appears normal         Ductal Arch:            Appears normal  Choroid Plexus:        Appears normal         Diaphragm:              Appears normal  Cerebellum:            Appears normal         Stomach:                Appears normal, left                                                                        sided  Posterior Fossa:       Appears normal         Abdomen:                Appears normal  Nuchal Fold:           Not applicable (>20    Abdominal Wall:         Appears nml (cord                         wks GA)                                        insert, abd wall)  Face:                  Appears normal         Cord Vessels:           Appears normal (3                         (orbits and profile)                           vessel cord)  Lips:                  Appears normal         Kidneys:                Appear normal  Palate:                Not well visualized    Bladder:                Appears normal  Thoracic:              Appears normal         Spine:                  Appears normal  Heart:  Appears normal         Upper Extremities:      Appears normal                         (4CH, axis, and                         situs)  RVOT:                  Appears normal         Lower Extremities:      Appears normal  Other:  Female gender ---------------------------------------------------------------------- Cervix Uterus Adnexa  Cervix  Length:            3.8  cm.  Normal appearance by transabdominal scan. ---------------------------------------------------------------------- Impression  Normal interval growth.  Prior history of preeclmapsia and GDM ---------------------------------------------------------------------- Recommendations  Consider growth in the third trimester. ----------------------------------------------------------------------               Sander Nephew, MD Electronically Signed Final Report   07/11/2019 04:37 pm  ----------------------------------------------------------------------   Assessment and Plan:  Pregnancy: [id number] at [redacted]w[redacted]d 1. Encounter for supervision of low-risk pregnancy, antepartum +FM 2 hour GTT at next visit  Desired BTL needs to sign Title XIX papers at next visit   2. Anemia affecting pregnancy, antepartum FeSO4 1 po bid   3. ASCUS with positive high risk HPV cervical   4. Non Intractable migraine without aura and without status migrainosus Last HA last night. Took tylenol pm. Not helpful.  Will prescribe Fioricet prn  Preterm labor symptoms and general obstetric precautions including but not limited to vaginal bleeding, contractions, leaking of fluid and fetal movement were reviewed in detail with the patient. I discussed the assessment and treatment plan with the patient. The patient was provided an opportunity to ask questions and all were answered. The patient agreed with the plan and demonstrated an understanding of the instructions. The patient was advised to call back or seek an in-person office evaluation/go to MAU at [facility] for any urgent or concerning symptoms. Please refer to After Visit Summary for other counseling recommendations.   I provided 16 minutes of face-to-face time during this encounter.  No follow-ups on file.  Future Appointments  Date Time Provider [place]  09/04/2019  8:45 AM [place] [facility]-US  09/04/2019  8:45 AM [place] 2 WH-MFCUS MFC-US    Lavonia Drafts, MD Center for [facility], [place]

## 2019-08-07 NOTE — Progress Notes (Signed)
I connected with  Margaret Cain on 08/07/19 at  3:35 PM EDT by telephone and verified that I am speaking with the correct person using two identifiers.   I discussed the limitations, risks, security and privacy concerns of performing an evaluation and management service by telephone and the availability of in person appointments. I also discussed with the patient that there may be a patient responsible charge related to this service. The patient expressed understanding and agreed to proceed.  Annabell Howells, RN 08/07/2019  3:46 PM

## 2019-08-28 ENCOUNTER — Other Ambulatory Visit: Payer: Self-pay

## 2019-08-28 ENCOUNTER — Inpatient Hospital Stay (HOSPITAL_COMMUNITY)
Admission: AD | Admit: 2019-08-28 | Discharge: 2019-08-28 | Disposition: A | Discharge status: Home / Self Care | Payer: Medicaid Other | Source: Ambulatory Visit | Attending: Obstetrics & Gynecology | Admitting: Obstetrics & Gynecology

## 2019-08-28 ENCOUNTER — Encounter (HOSPITAL_COMMUNITY): Payer: Self-pay

## 2019-08-28 DIAGNOSIS — O26892 Other specified pregnancy related conditions, second trimester: Secondary | ICD-10-CM | POA: Insufficient documentation

## 2019-08-28 DIAGNOSIS — O99891 Other specified diseases and conditions complicating pregnancy: Secondary | ICD-10-CM

## 2019-08-28 DIAGNOSIS — M549 Dorsalgia, unspecified: Secondary | ICD-10-CM | POA: Diagnosis not present

## 2019-08-28 DIAGNOSIS — Z3A27 27 weeks gestation of pregnancy: Secondary | ICD-10-CM | POA: Diagnosis not present

## 2019-08-28 DIAGNOSIS — Z880 Allergy status to penicillin: Secondary | ICD-10-CM | POA: Insufficient documentation

## 2019-08-28 DIAGNOSIS — M545 Low back pain: Secondary | ICD-10-CM | POA: Insufficient documentation

## 2019-08-28 DIAGNOSIS — R109 Unspecified abdominal pain: Secondary | ICD-10-CM | POA: Insufficient documentation

## 2019-08-28 LAB — URINALYSIS, ROUTINE W REFLEX MICROSCOPIC
Bilirubin Urine: NEGATIVE
Glucose, UA: NEGATIVE mg/dL
Hgb urine dipstick: NEGATIVE
Ketones, ur: NEGATIVE mg/dL
Nitrite: NEGATIVE
Protein, ur: NEGATIVE mg/dL
Specific Gravity, Urine: 1.014 (ref 1.005–1.030)
pH: 7 (ref 5.0–8.0)

## 2019-08-28 LAB — FETAL FIBRONECTIN: Fetal Fibronectin: NEGATIVE

## 2019-08-28 MED ORDER — NIFEDIPINE 10 MG PO CAPS
10.0000 mg | ORAL_CAPSULE | ORAL | Status: DC | PRN
  Administered 2019-08-28: 10 mg via ORAL
  Filled 2019-08-28 (×2): qty 1

## 2019-08-28 MED ORDER — LACTATED RINGERS IV BOLUS
1000.0000 mL | Freq: Once | INTRAVENOUS | Status: AC
  Administered 2019-08-28: 07:00:00 1000 mL via INTRAVENOUS

## 2019-08-28 MED ORDER — CYCLOBENZAPRINE HCL 10 MG PO TABS
5.0000 mg | ORAL_TABLET | Freq: Once | ORAL | Status: AC
  Administered 2019-08-28: 07:00:00 5 mg via ORAL
  Filled 2019-08-28: qty 1

## 2019-08-28 MED ORDER — CYCLOBENZAPRINE HCL 10 MG PO TABS: 10.0000 mg | ORAL_TABLET | Freq: Two times a day (BID) | ORAL | 0 refills | Status: DC | PRN

## 2019-08-28 NOTE — Progress Notes (Signed)
EFM removed.

## 2019-08-28 NOTE — MAU Note (Signed)
Pt reports to MAU via EMS for abdominal pain, she  States " that it started as mild cramping but at 3am was woken from her sleep with worse cramping, she also states that she doesn't think they are contractions but something else." denies bleeding and leaking fluid, confirms feeling fetal movement.

## 2019-08-28 NOTE — MAU Provider Note (Addendum)
Chief Complaint:  Abdominal Pain   First Provider Initiated Contact with Patient 08/28/19 0524     HPI: Margaret Cain is a 33 y.o. G2I9485 at 70w1dho presents via EMS to maternity admissions reporting uterine cramping since 3am.  No leaking or bleeding.. She reports good fetal movement, denies vaginal itching/burning, urinary symptoms, h/a, dizziness, n/v, diarrhea, constipation or fever/chills.   Abdominal Pain This is a new problem. The current episode started today. The onset quality is gradual. The problem occurs intermittently. The problem has been unchanged. The pain is located in the LLQ and RLQ. The pain is mild. The quality of the pain is cramping. The abdominal pain does not radiate. Pertinent negatives include no constipation, diarrhea, dysuria, fever, frequency, nausea or vomiting. Nothing aggravates the pain. The pain is relieved by nothing. She has tried nothing for the symptoms.    RN Note: Pt reports to MAU via EMS for abdominal pain, she  States " that it started as mild cramping but at 3am was woken from her sleep with worse cramping, she also states that she doesn't think they are contractions but something else." denies bleeding and leaking fluid, confirms feeling fetal movement  Past Medical History: Past Medical History:  Diagnosis Date  . Anemia   . Anxiety   . Depression    ppd with first child  . Gestational diabetes    diet controlled  . Headache(784.0)   . Hx of chlamydia infection   . Positive GBS test 03/18/2017  . Pregnancy induced hypertension   . Preterm labor   . Trichomonosis   . Urinary tract infection   . Vaginal Pap smear, abnormal    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    Past obstetric history: OB History  Gravida Para Term Preterm AB Living  '9 7 6 1 1 7  ' SAB TAB Ectopic Multiple Live Births  1 0 0 0 7    # Outcome Date GA Lbr Len/2nd Weight Sex Delivery Anes PTL Lv  9 Current           8 Term 11/07/17 348w6d6:29 / 00:17 3650 g F Vag-Spont  EPI  LIV  7 Term 03/19/16 3834w1d:45 / 00:07 3320 g M Vag-Spont EPI  LIV     Birth Comments: none  6 Term 09/08/14 37w47w4d40 / 00:07 3155 g F Vag-Spont None  LIV  5 Term 09/20/13 39w518w5d0 / 00:31 3175 g F Vag-Spont EPI  LIV  4 Preterm 07/17/12 70w3d83w3d / 00:12 1975 g M Vag-Spont EPI  LIV  3 SAB 10/05/11          2 Term 10/15/10 [redacted]w[redacted]d 20w0dg M Vag-Spont   LIV  1 Term 2005 [redacted]w[redacted]d  [redacted]w[redacted]d M Vag-Spont   LIV    Past Surgical History: Past Surgical History:  Procedure Laterality Date  . COLPOSCOPY      Family History: Family History  Problem Relation Age of Onset  . Asthma Son   . Asthma Paternal Aunt   . Anesthesia problems Neg Hx   . Hypotension Neg Hx   . Malignant hyperthermia Neg Hx   . Pseudochol deficiency Neg Hx   . Other Neg Hx   . Cancer Neg Hx   . Heart disease Neg Hx   . Stroke Neg Hx   . Hearing loss Neg Hx     Social History: Social History   Tobacco Use  . Smoking status: Never Smoker  . Smokeless tobacco: Never Used  Substance  Use Topics  . Alcohol use: No  . Drug use: No    Allergies:  Allergies  Allergen Reactions  . Penicillins Nausea And Vomiting    Has patient had a PCN reaction causing immediate rash, facial/tongue/throat swelling, SOB or lightheadedness with hypotension: No Has patient had a PCN reaction causing severe rash involving mucus membranes or skin necrosis: No Has patient had a PCN reaction that required hospitalization: No Has patient had a PCN reaction occurring within the last 10 years: No If all of the above answers are "NO", then may proceed with Cephalosporin use.     Meds:  Medications Prior to Admission  Medication Sig Dispense Refill Last Dose  . prenatal vitamin w/FE, FA (PRENATAL 1 + 1) 27-1 MG TABS tablet Take 1 tablet by mouth daily at 12 noon.   08/27/2019 at Unknown time  . Blood Pressure Monitoring (BLOOD PRESSURE KIT) DEVI 1 Device by Does not apply route daily. ICD 10: Z34.00 1 Device 0   .  Butalbital-APAP-Caffeine 50-325-40 MG capsule Take 1-2 capsules by mouth every 6 (six) hours as needed for headache. 30 capsule 3   . ferrous sulfate (FERROUSUL) 325 (65 FE) MG tablet Take 1 tablet (325 mg total) by mouth 2 (two) times daily. 60 tablet 1   . lamoTRIgine (LAMICTAL) 100 MG tablet Take 50 mg by mouth 2 (two) times daily.     . Prenatal Vit-Fe Phos-FA-Omega (VITAFOL GUMMIES) 3.33-0.333-34.8 MG CHEW CHEW 3 GUMMIES BY MOUTH DAILY       I have reviewed patient's Past Medical Hx, Surgical Hx, Family Hx, Social Hx, medications and allergies.   ROS:  Review of Systems  Constitutional: Negative for fever.  Gastrointestinal: Positive for abdominal pain. Negative for constipation, diarrhea, nausea and vomiting.  Genitourinary: Negative for dysuria and frequency.   Other systems negative  Physical Exam   Patient Vitals for the past 24 hrs:  BP Temp Pulse Resp SpO2  08/28/19 0459 103/64 97.9 F (36.6 C) 82 18 100 %   Constitutional: Well-developed, well-nourished female in no acute distress.  Cardiovascular: normal rate and rhythm Respiratory: normal effort, clear to auscultation bilaterally GI: Abd soft, non-tender, gravid appropriate for gestational age.   No rebound or guarding. MS: Extremities nontender, no edema, normal ROM Neurologic: Alert and oriented x 4.  GU: Neg CVAT.  PELVIC EXAM:    Dilation: Closed Effacement (%): 30 Station: Ballotable Exam by:: Hansel Feinstein, CNM   FHT:  Baseline 135 , moderate variability, accelerations present, no decelerations Contractions: Every 2-4 min   Irregular     Labs: Results for orders placed or performed during the hospital encounter of 08/28/19 (from the past 24 hour(s))  Fetal fibronectin     Status: None   Collection Time: 08/28/19  5:33 AM  Result Value Ref Range   Fetal Fibronectin NEGATIVE NEGATIVE  Urinalysis, Routine w reflex microscopic     Status: Abnormal   Collection Time: 08/28/19  5:47 AM  Result Value Ref  Range   Color, Urine YELLOW YELLOW   APPearance CLEAR CLEAR   Specific Gravity, Urine 1.014 1.005 - 1.030   pH 7.0 5.0 - 8.0   Glucose, UA NEGATIVE NEGATIVE mg/dL   Hgb urine dipstick NEGATIVE NEGATIVE   Bilirubin Urine NEGATIVE NEGATIVE   Ketones, ur NEGATIVE NEGATIVE mg/dL   Protein, ur NEGATIVE NEGATIVE mg/dL   Nitrite NEGATIVE NEGATIVE   Leukocytes,Ua TRACE (A) NEGATIVE   RBC / HPF 0-5 0 - 5 RBC/hpf   WBC, UA 0-5  0 - 5 WBC/hpf   Bacteria, UA RARE (A) NONE SEEN   Squamous Epithelial / LPF 6-10 0 - 5   Mucus PRESENT     O/Positive/-- (08/06 1437)  Imaging:  No results found.  MAU Course/MDM: I have ordered labs and reviewed results.   Fetal fibronectin sent due to hx PTD  >> NEGATIVE Urine shows no dehydration NST reviewed, reactive  Treatments in MAU included Procardia series, x1 dose   After that, patient felt bad and her BP was low and HR rose.   So we held further doses and started an IV to give some fluid by bolus  She continued to complain of low back pain and we gave a dose of Flexeril 31m  Report given to oncoming provider  Assessment: Single IUP at 297w1dterine cramping, no preterm labor Low back pain   MaHansel FeinsteinNM, MSN Certified Nurse-Midwife 08/28/2019 5:24 AM   Care assumed of patient at 0750 from M. WiJimmye NormanCNM  MDM (WJeronimo GreavesCNM) --Discussed potential for additional 55m84mf Flexeril but voiced my concern due to likelihood of sleepiness and patient's reliance on public transportation.  --Patient agreeable to my suggestion of outpatient rx for Flexeril 67m62m/P --33 y38. [id number] FFN, closed cervix --Reactive tracing --MSK pain in pregnancy --Discharge home in stable condition  F/U: --[facility] 09/04/19  SamanMallie Snooks, CNM Certified Nurse Midwife, Faculty Practice 08/28/19 8:37 AM

## 2019-08-28 NOTE — Discharge Instructions (Signed)

## 2019-08-28 NOTE — MAU Note (Signed)
Pt reports having reflux and heart burn.

## 2019-09-02 ENCOUNTER — Other Ambulatory Visit: Payer: Self-pay | Admitting: *Deleted

## 2019-09-02 DIAGNOSIS — Z3490 Encounter for supervision of normal pregnancy, unspecified, unspecified trimester: Secondary | ICD-10-CM

## 2019-09-03 ENCOUNTER — Telehealth: Payer: Self-pay | Admitting: Family Medicine

## 2019-09-03 NOTE — Telephone Encounter (Signed)
°  Attempted to call patient about her appointment on 11/19 @ 10:00. No answer left voicemail instructing patient to wear a face mask for the entire appointment and no visitors are allowed during the visit. Patient instructed not to attend the appointment if she was any symptoms. Symptom list and office number left. Patient instructed to come fasting

## 2019-09-04 ENCOUNTER — Ambulatory Visit (HOSPITAL_COMMUNITY)
Admission: RE | Admit: 2019-09-04 | Discharge: 2019-09-04 | Disposition: A | Discharge status: Home / Self Care | Payer: Medicaid Other | Source: Ambulatory Visit | Attending: Maternal & Fetal Medicine | Admitting: Maternal & Fetal Medicine

## 2019-09-04 ENCOUNTER — Ambulatory Visit (HOSPITAL_COMMUNITY): Payer: Medicaid Other | Admitting: *Deleted

## 2019-09-04 ENCOUNTER — Other Ambulatory Visit: Payer: Self-pay

## 2019-09-04 ENCOUNTER — Ambulatory Visit (INDEPENDENT_AMBULATORY_CARE_PROVIDER_SITE_OTHER): Payer: Medicaid Other | Admitting: Family Medicine

## 2019-09-04 ENCOUNTER — Encounter (HOSPITAL_COMMUNITY): Payer: Self-pay

## 2019-09-04 ENCOUNTER — Other Ambulatory Visit: Payer: Medicaid Other

## 2019-09-04 ENCOUNTER — Encounter: Payer: Self-pay | Admitting: *Deleted

## 2019-09-04 VITALS — BP 99/70 | HR 86 | Temp 98.0°F

## 2019-09-04 VITALS — Wt 107.1 lb

## 2019-09-04 DIAGNOSIS — O09293 Supervision of pregnancy with other poor reproductive or obstetric history, third trimester: Secondary | ICD-10-CM | POA: Diagnosis not present

## 2019-09-04 DIAGNOSIS — Z3A28 28 weeks gestation of pregnancy: Secondary | ICD-10-CM

## 2019-09-04 DIAGNOSIS — O0990 Supervision of high risk pregnancy, unspecified, unspecified trimester: Secondary | ICD-10-CM

## 2019-09-04 DIAGNOSIS — Z8759 Personal history of other complications of pregnancy, childbirth and the puerperium: Secondary | ICD-10-CM | POA: Diagnosis present

## 2019-09-04 DIAGNOSIS — Z3490 Encounter for supervision of normal pregnancy, unspecified, unspecified trimester: Secondary | ICD-10-CM

## 2019-09-04 DIAGNOSIS — Z23 Encounter for immunization: Secondary | ICD-10-CM | POA: Diagnosis not present

## 2019-09-04 DIAGNOSIS — Z641 Problems related to multiparity: Secondary | ICD-10-CM | POA: Diagnosis not present

## 2019-09-04 DIAGNOSIS — O09213 Supervision of pregnancy with history of pre-term labor, third trimester: Secondary | ICD-10-CM

## 2019-09-04 DIAGNOSIS — Z3493 Encounter for supervision of normal pregnancy, unspecified, third trimester: Secondary | ICD-10-CM | POA: Diagnosis present

## 2019-09-04 DIAGNOSIS — Z362 Encounter for other antenatal screening follow-up: Secondary | ICD-10-CM

## 2019-09-04 MED ORDER — COMFORT FIT MATERNITY SUPP MED MISC: 1.0000 | 0 refills | Status: DC | PRN

## 2019-09-04 NOTE — Progress Notes (Signed)
BTL paperwork signed today  

## 2019-09-04 NOTE — Progress Notes (Signed)
    PRENATAL VISIT NOTE  Subjective:  Margaret Cain is a 33 y.o. [id number] at [redacted]w[redacted]d being seen today for ongoing prenatal care.  She is currently monitored for the following issues for this low-risk pregnancy and has ASCUS with positive high risk HPV cervical; Anemia affecting pregnancy, antepartum; Migraine headache; Trichomonas infection; Supervision of low-risk pregnancy; and Margaret Cain multipara on their problem list.  Patient reports backache.  Contractions: Irritability. Vag. Bleeding: None.  Movement: Present. Denies leaking of fluid.   The following portions of the patient's history were reviewed and updated as appropriate: allergies, current medications, past family history, past medical history, past social history, past surgical history and problem list.   Objective:   Vitals:   09/04/19 1000  Weight: 107 lb 1.6 oz (48.6 kg)    Fetal Status:   Fundal Height: 26 cm Movement: Present     General:  Alert, oriented and cooperative. Patient is in no acute distress.  Skin: Skin is warm and dry. No rash noted.   Cardiovascular: Normal heart rate noted  Respiratory: Normal respiratory effort, no problems with respiration noted  Abdomen: Soft, gravid, appropriate for gestational age.  Pain/Pressure: Present     Pelvic: Cervical exam deferred        Extremities: Normal range of motion.  Edema: None  Mental Status: Normal mood and affect. Normal behavior. Normal judgment and thought content.   Assessment and Plan:  Pregnancy: E0P2330 at [redacted]w[redacted]d 1. Encounter for supervision of low-risk pregnancy in third trimester Continue routine prenatal care. 28 wk labs and TDaP today  2. Alpha multipara Signed BTL papers today  Preterm labor symptoms and general obstetric precautions including but not limited to vaginal bleeding, contractions, leaking of fluid and fetal movement were reviewed in detail with the patient. Please refer to After Visit Summary for other counseling recommendations.    Return in 2 weeks (on 09/18/2019).  Future Appointments  Date Time Provider Department Center  09/25/2019  2:15 PM Donnamae Jude, MD [facility]    Donnamae Jude, MD

## 2019-09-04 NOTE — Patient Instructions (Signed)

## 2019-09-05 ENCOUNTER — Other Ambulatory Visit: Payer: Self-pay | Admitting: Family Medicine

## 2019-09-05 DIAGNOSIS — O99019 Anemia complicating pregnancy, unspecified trimester: Secondary | ICD-10-CM

## 2019-09-05 LAB — CBC
Hematocrit: 27.9 % — ABNORMAL LOW (ref 34.0–46.6)
Hemoglobin: 9.4 g/dL — ABNORMAL LOW (ref 11.1–15.9)
MCH: 29.1 pg (ref 26.6–33.0)
MCHC: 33.7 g/dL (ref 31.5–35.7)
MCV: 86 fL (ref 79–97)
Platelets: 148 10*3/uL — ABNORMAL LOW (ref 150–450)
RBC: 3.23 x10E6/uL — ABNORMAL LOW (ref 3.77–5.28)
RDW: 12 % (ref 11.7–15.4)
WBC: 6.4 10*3/uL (ref 3.4–10.8)

## 2019-09-05 LAB — GLUCOSE TOLERANCE, 2 HOURS W/ 1HR
Glucose, 1 hour: 142 mg/dL (ref 65–179)
Glucose, 2 hour: 91 mg/dL (ref 65–152)
Glucose, Fasting: 57 mg/dL — ABNORMAL LOW (ref 65–91)

## 2019-09-05 LAB — HIV ANTIBODY (ROUTINE TESTING W REFLEX): HIV Screen 4th Generation wRfx: NONREACTIVE

## 2019-09-05 LAB — RPR: RPR Ser Ql: NONREACTIVE

## 2019-09-05 MED ORDER — FERROUS SULFATE 325 (65 FE) MG PO TABS: 325.0000 mg | ORAL_TABLET | Freq: Two times a day (BID) | ORAL | 1 refills | Status: DC

## 2019-09-07 ENCOUNTER — Encounter (HOSPITAL_COMMUNITY): Payer: Self-pay | Admitting: *Deleted

## 2019-09-07 ENCOUNTER — Other Ambulatory Visit: Payer: Self-pay

## 2019-09-07 ENCOUNTER — Inpatient Hospital Stay (HOSPITAL_COMMUNITY)
Admission: AD | Admit: 2019-09-07 | Discharge: 2019-09-07 | Disposition: A | Discharge status: Home / Self Care | Payer: Medicaid Other | Attending: Family Medicine | Admitting: Family Medicine

## 2019-09-07 DIAGNOSIS — Z825 Family history of asthma and other chronic lower respiratory diseases: Secondary | ICD-10-CM | POA: Insufficient documentation

## 2019-09-07 DIAGNOSIS — N898 Other specified noninflammatory disorders of vagina: Secondary | ICD-10-CM | POA: Insufficient documentation

## 2019-09-07 DIAGNOSIS — O23593 Infection of other part of genital tract in pregnancy, third trimester: Secondary | ICD-10-CM | POA: Diagnosis not present

## 2019-09-07 DIAGNOSIS — O26893 Other specified pregnancy related conditions, third trimester: Secondary | ICD-10-CM | POA: Insufficient documentation

## 2019-09-07 DIAGNOSIS — Z3A28 28 weeks gestation of pregnancy: Secondary | ICD-10-CM

## 2019-09-07 DIAGNOSIS — B9689 Other specified bacterial agents as the cause of diseases classified elsewhere: Secondary | ICD-10-CM | POA: Diagnosis not present

## 2019-09-07 DIAGNOSIS — Z79899 Other long term (current) drug therapy: Secondary | ICD-10-CM | POA: Insufficient documentation

## 2019-09-07 DIAGNOSIS — N760 Acute vaginitis: Secondary | ICD-10-CM

## 2019-09-07 DIAGNOSIS — Z880 Allergy status to penicillin: Secondary | ICD-10-CM | POA: Insufficient documentation

## 2019-09-07 LAB — WET PREP, GENITAL
Sperm: NONE SEEN
Trich, Wet Prep: NONE SEEN
Yeast Wet Prep HPF POC: NONE SEEN

## 2019-09-07 LAB — URINALYSIS, ROUTINE W REFLEX MICROSCOPIC
Bilirubin Urine: NEGATIVE
Glucose, UA: NEGATIVE mg/dL
Hgb urine dipstick: NEGATIVE
Ketones, ur: NEGATIVE mg/dL
Nitrite: NEGATIVE
Protein, ur: NEGATIVE mg/dL
Specific Gravity, Urine: 1.01 (ref 1.005–1.030)
pH: 6 (ref 5.0–8.0)

## 2019-09-07 MED ORDER — METRONIDAZOLE 1 % EX GEL: Freq: Every day | CUTANEOUS | 0 refills | Status: DC

## 2019-09-07 MED ORDER — METRONIDAZOLE 500 MG PO TABS: 500.0000 mg | ORAL_TABLET | Freq: Two times a day (BID) | ORAL | 0 refills | Status: DC

## 2019-09-07 NOTE — MAU Note (Signed)
Margaret Cain is a 33 y.o. at [redacted]w[redacted]d here in MAU reporting: around 0900 today she felt some fluid come out. States unsure if mucus or just regular discharge. Has felt some more come out when she is walking. States the leaking was white and thick. No odor or itching. No recent IC. Having some abdominal pain. No bleeding. +FM  Onset of complaint: today at 0900  Pain score: 3/10  Vitals:   09/07/19 1313  BP: 106/64  Pulse: 100  Resp: 18  Temp: 98.2 F (36.8 C)  SpO2: 100%     FHT: +FM  Lab orders placed from triage: UA

## 2019-09-07 NOTE — MAU Provider Note (Signed)
History     CSN: [id number]  Arrival date and time: 09/07/19 1301   First Provider Initiated Contact with Patient 09/07/19 1338      Chief Complaint  Patient presents with  . Vaginal Discharge  . Abdominal Pain   HPI  [name] is a 33 y.o. female [phone] here in MAU complaining of vaginal discharge. She attests to Thick white discharge that started 9 am. No blood in discharge. The discharge was noted on her underwear and she was concerned it may be her mucus plug.  + gush of fluid. + fetal movement.    OB History    Gravida  9   Para  7   Term  6   Preterm  1   AB  1   Living  7     SAB  1   TAB  0   Ectopic  0   Multiple  0   Live Births  7           Past Medical History:  Diagnosis Date  . Anemia   . Anxiety   . Depression    ppd with first child  . Gestational diabetes    diet controlled  . Headache(784.0)   . Hx of chlamydia infection   . Positive GBS test 03/18/2017  . Pregnancy induced hypertension   . Preterm labor   . Trichomonosis   . Urinary tract infection   . 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 COLPOSCOPY      Family History  Problem Relation Age of Onset  . Asthma Son   . Asthma Paternal Aunt   . Anesthesia problems Neg Hx   . Hypotension Neg Hx   . Malignant hyperthermia Neg Hx   . Pseudochol deficiency Neg Hx   . Other Neg Hx   . Cancer Neg Hx   . Heart disease Neg Hx   . Stroke Neg Hx   . Hearing loss Neg Hx     Social History   Tobacco Use  . Smoking status: Never Smoker  . Smokeless tobacco: Never Used  Substance Use Topics  . Alcohol use: No  . Drug use: No    Allergies:  Allergies  Allergen Reactions  . Penicillins Nausea And Vomiting    Has patient had a PCN reaction causing immediate rash, facial/tongue/throat swelling, SOB or lightheadedness with hypotension: No Has patient had a PCN reaction causing severe  rash involving mucus membranes or skin necrosis: No Has patient had a PCN reaction that required hospitalization: No Has patient had a PCN reaction occurring within the last 10 years: No If all of the above answers are "NO", then may proceed with Cephalosporin use.     Medications Prior to Admission  Medication Sig Dispense Refill Last Dose  . Butalbital-APAP-Caffeine 50-325-40 MG capsule Take 1-2 capsules by mouth every 6 (six) hours as needed for headache. 30 capsule 3 Past Week at Unknown time  . cyclobenzaprine (FLEXERIL) 10 MG tablet Take 1 tablet (10 mg total) by mouth 2 (two) times daily as needed for muscle spasms. 20 tablet 0 Past Week at Unknown time  . Blood Pressure Monitoring (BLOOD PRESSURE KIT) DEVI 1 Device by Does not apply route daily. ICD 10: Z34.00 1 Device 0   . Elastic Bandages & Supports (COMFORT FIT MATERNITY SUPP MED) MISC 1 each by Does not apply route as needed. 1  each 0   . ferrous sulfate (FERROUSUL) 325 (65 FE) MG tablet Take 1 tablet (325 mg total) by mouth 2 (two) times daily. 60 tablet 1   . lamoTRIgine (LAMICTAL) 100 MG tablet Take 50 mg by mouth 2 (two) times daily.     . Prenatal Vit-Fe Phos-FA-Omega (VITAFOL GUMMIES) 3.33-0.333-34.8 MG CHEW CHEW 3 GUMMIES BY MOUTH DAILY     . prenatal vitamin w/FE, FA (PRENATAL 1 + 1) 27-1 MG TABS tablet Take 1 tablet by mouth daily at 12 noon.      Results for orders placed or performed during the hospital encounter of 09/07/19 (from the past 48 hour(s))  Urinalysis, Routine w reflex microscopic     Status: Abnormal   Collection Time: 09/07/19  1:18 PM  Result Value Ref Range   Color, Urine YELLOW YELLOW   APPearance CLEAR CLEAR   Specific Gravity, Urine 1.010 1.005 - 1.030   pH 6.0 5.0 - 8.0   Glucose, UA NEGATIVE NEGATIVE mg/dL   Hgb urine dipstick NEGATIVE NEGATIVE   Bilirubin Urine NEGATIVE NEGATIVE   Ketones, ur NEGATIVE NEGATIVE mg/dL   Protein, ur NEGATIVE NEGATIVE mg/dL   Nitrite NEGATIVE NEGATIVE    Leukocytes,Ua TRACE (A) NEGATIVE   RBC / HPF 0-5 0 - 5 RBC/hpf   WBC, UA 0-5 0 - 5 WBC/hpf   Bacteria, UA RARE (A) NONE SEEN   Squamous Epithelial / LPF 0-5 0 - 5   Mucus PRESENT     Comment: Performed at [facility], [address] [place], [place] [address]  Wet prep, genital     Status: Abnormal   Collection Time: 09/07/19  2:03 PM  Result Value Ref Range   Yeast Wet Prep HPF POC NONE SEEN NONE SEEN   Trich, Wet Prep NONE SEEN NONE SEEN   Clue Cells Wet Prep HPF POC PRESENT (A) NONE SEEN   WBC, Wet Prep HPF POC MANY (A) NONE SEEN   Sperm NONE SEEN     Comment: Performed at [facility], [place], [place], [place] [address]   Review of Systems  Gastrointestinal: Negative for abdominal pain and anal bleeding.  Genitourinary: Positive for vaginal discharge. Negative for dysuria and vaginal bleeding.   Physical Exam   Blood pressure 106/64, pulse 100, temperature 98.2 F (36.8 C), temperature source Oral, resp. rate 18, height 5' (1.524 m), weight 50.3 kg, last menstrual period 02/13/2019, SpO2 100 %, not currently breastfeeding.  Physical Exam  Constitutional: She is oriented to person, place, and time. She appears well-developed and well-nourished. No distress.  HENT:  Head: Normocephalic.  Eyes: Pupils are equal, round, and reactive to light.  GI: Soft. She exhibits no distension. There is no abdominal tenderness. There is no rebound and no guarding.  Genitourinary:    Genitourinary Comments: Vagina - Small- Moderate amount of white vaginal discharge, mild odor. No pooling of fluid.  Cervix - No contact bleeding, no active bleeding  Bimanual exam: Cervix closed, thick, anterior  GC/Chlam, wet prep done Chaperone present for exam.    Musculoskeletal: Normal range of motion.  Neurological: She is alert and oriented to person, place, and time.  Skin: Skin is warm. She is not diaphoretic.  Psychiatric: Her behavior is normal.   Fetal Tracing: Baseline:  130 bpm Variability: Moderate  Accelerations: 15x15 Decelerations: None Toco: 1 contraction   MAU Course  Procedures None  MDM  Fern slide negative. No pooling of fluid Cervix closed   Assessment and  Plan   A:  1. Vaginal discharge in pregnancy in third trimester   2. Bacterial vaginosis   3. [redacted] weeks gestation of pregnancy     P:  Discharge home in stable condition Rx: Metrogel Return to MAU if symptoms worsen Keep OB appointment in the office as scheduled.  Lezlie Lye, NP 09/07/2019 7:55 PM

## 2019-09-07 NOTE — Discharge Instructions (Signed)

## 2019-09-08 LAB — GC/CHLAMYDIA PROBE AMP (~~LOC~~) NOT AT ARMC
Chlamydia: NEGATIVE
Comment: NEGATIVE
Comment: NORMAL
Neisseria Gonorrhea: NEGATIVE

## 2019-09-23 ENCOUNTER — Telehealth: Payer: Self-pay | Admitting: Family Medicine

## 2019-09-23 ENCOUNTER — Telehealth (INDEPENDENT_AMBULATORY_CARE_PROVIDER_SITE_OTHER): Payer: Medicaid Other | Admitting: Lactation Services

## 2019-09-23 DIAGNOSIS — Z3493 Encounter for supervision of normal pregnancy, unspecified, third trimester: Secondary | ICD-10-CM

## 2019-09-23 NOTE — Telephone Encounter (Signed)
Called pt in response to her call with questions/concerns. Pt did not answer. LM for pt to call the office at her earliest convenience. Will send My Chart Message.

## 2019-09-23 NOTE — Telephone Encounter (Signed)
Pt returned call. Pt reports she has had some constipation last Saturday and this morning. She is having clear discharge also. Pt reports is has a mild odor, not fishy. She is not having burning, itching, or irritation. She is not having leaking or bleeding. She notices the discharge when going to the bathroom and wiping.  Pt was seen in MAU 11/22 for same vaginal discharge. She reports she took the medication as prescribed.   She is drinking water and coffee. She is eating lots of fruit and vegetables. She is concerned that she needs to go to the hospital. Discussed that the symptoms she is experiencing is normal for pregnancy and I do not recommend that she go to the hospital at this time.   Advised pt to try Ducosate Sodium BID to see if that helps with stooling. She is on PNV and Fe supplements also. Message sent to Pt my chart at her request.   Pt to call with further questions or concerns as needed.

## 2019-09-23 NOTE — Telephone Encounter (Signed)
Patient called to ask about speaking to the provider. Please call back to answer questions.

## 2019-09-25 ENCOUNTER — Ambulatory Visit (INDEPENDENT_AMBULATORY_CARE_PROVIDER_SITE_OTHER): Payer: Medicaid Other | Admitting: Family Medicine

## 2019-09-25 ENCOUNTER — Other Ambulatory Visit: Payer: Self-pay

## 2019-09-25 DIAGNOSIS — Z3A31 31 weeks gestation of pregnancy: Secondary | ICD-10-CM | POA: Diagnosis not present

## 2019-09-25 DIAGNOSIS — Z3483 Encounter for supervision of other normal pregnancy, third trimester: Secondary | ICD-10-CM

## 2019-09-25 DIAGNOSIS — Z3493 Encounter for supervision of normal pregnancy, unspecified, third trimester: Secondary | ICD-10-CM

## 2019-09-25 DIAGNOSIS — R87810 Cervical high risk human papillomavirus (HPV) DNA test positive: Secondary | ICD-10-CM

## 2019-09-25 DIAGNOSIS — R87610 Atypical squamous cells of undetermined significance on cytologic smear of cervix (ASC-US): Secondary | ICD-10-CM

## 2019-09-25 NOTE — Progress Notes (Signed)
I connected with  Margaret Cain on 09/25/19 at 1435 by telephone and verified that I am speaking with the correct person using two identifiers.   I discussed the limitations, risks, security and privacy concerns of performing an evaluation and management service by telephone and the availability of in person appointments. I also discussed with the patient that there may be a patient responsible charge related to this service. The patient expressed understanding and agreed to proceed.  Pt unable to check BP; cuff giving error message. Pt states she has a mild headache that improved with tylenol. Denies blurry vision or dizziness.   Annabell Howells, RN 09/25/2019  2:37 PM

## 2019-09-26 NOTE — Progress Notes (Signed)
   TELEHEALTH VIRTUAL OBSTETRICS VISIT ENCOUNTER NOTE  I connected with Margaret Cain on 09/26/19 at  2:15 PM EST by telephone at home and verified that I am speaking with the correct person using two identifiers.   I discussed the limitations, risks, security and privacy concerns of performing an evaluation and management service by telephone and the availability of in person appointments. I also discussed with the patient that there may be a patient responsible charge related to this service. The patient expressed understanding and agreed to proceed.  Subjective:  Margaret Cain is a 33 y.o. [id number] at [redacted]w[redacted]d being followed for ongoing prenatal care.  She is currently monitored for the following issues for this low-risk pregnancy and has ASCUS with positive high risk HPV cervical; Anemia affecting pregnancy, antepartum; Migraine headache; Trichomonas infection; Supervision of low-risk pregnancy; and [place] multipara on their problem list.  Patient reports no complaints. Reports fetal movement. Denies any contractions, bleeding or leaking of fluid.   The following portions of the patient's history were reviewed and updated as appropriate: allergies, current medications, past family history, past medical history, past social history, past surgical history and problem list.   Objective:   General:  Alert, oriented and cooperative.   Mental Status: Normal mood and affect perceived. Normal judgment and thought content.  Rest of physical exam deferred due to type of encounter  Assessment and Plan:  Pregnancy: [id number] at [redacted]w[redacted]d 1. Encounter for supervision of low-risk pregnancy in third trimester Normal 2 hour Continue routine prenatal care.   Preterm labor symptoms and general obstetric precautions including but not limited to vaginal bleeding, contractions, leaking of fluid and fetal movement were reviewed in detail with the patient.  I discussed the assessment and treatment plan with the  patient. The patient was provided an opportunity to ask questions and all were answered. The patient agreed with the plan and demonstrated an understanding of the instructions. The patient was advised to call back or seek an in-person office evaluation/go to MAU at [facility] for any urgent or concerning symptoms. Please refer to After Visit Summary for other counseling recommendations.   I provided 11 minutes of non-face-to-face time during this encounter.  Return in about 2 weeks (around 10/09/2019) for virtual.  Future Appointments  Date Time Provider [place]  10/13/2019  4:15 PM Donnamae Jude, MD [place], [place] for [facility], [place]

## 2019-09-29 ENCOUNTER — Other Ambulatory Visit: Payer: Self-pay

## 2019-09-29 DIAGNOSIS — O0990 Supervision of high risk pregnancy, unspecified, unspecified trimester: Secondary | ICD-10-CM

## 2019-09-29 MED ORDER — BLOOD PRESSURE KIT DEVI: 1.0000 | Freq: Every day | 0 refills | Status: AC

## 2019-09-29 NOTE — Telephone Encounter (Signed)
Pt reports that she is having issues with BP cuff.  [facility] requested another order to be put in so that they can order her a new cuff.  BP cuff order placed.

## 2019-10-13 ENCOUNTER — Telehealth (INDEPENDENT_AMBULATORY_CARE_PROVIDER_SITE_OTHER): Payer: Medicaid Other | Admitting: Family Medicine

## 2019-10-13 ENCOUNTER — Other Ambulatory Visit: Payer: Self-pay

## 2019-10-13 DIAGNOSIS — Z641 Problems related to multiparity: Secondary | ICD-10-CM

## 2019-10-13 DIAGNOSIS — Z3A33 33 weeks gestation of pregnancy: Secondary | ICD-10-CM

## 2019-10-13 DIAGNOSIS — Z3403 Encounter for supervision of normal first pregnancy, third trimester: Secondary | ICD-10-CM

## 2019-10-13 NOTE — Progress Notes (Signed)
    TELEHEALTH OBSTETRICS PRENATAL VIRTUAL VIDEO VISIT ENCOUNTER NOTE  Provider location: [facility] for [facility] at [place]   I connected with Margaret Cain on 10/13/19 at  4:15 PM EST by MyChart Video Encounter at home and verified that I am speaking with the correct person using two identifiers.   I discussed the limitations, risks, security and privacy concerns of performing an evaluation and management service virtually and the availability of in person appointments. I also discussed with the patient that there may be a patient responsible charge related to this service. The patient expressed understanding and agreed to proceed. Subjective:  Margaret Cain is a 33 y.o. [id number] at [redacted]w[redacted]d being seen today for ongoing prenatal care.  She is currently monitored for the following issues for this high-risk pregnancy and has ASCUS with positive high risk HPV cervical; Anemia affecting pregnancy, antepartum; Migraine headache; Trichomonas infection; Supervision of low-risk pregnancy; and [place] multipara on their problem list.  Patient reports no complaints.  Contractions: Irritability. Vag. Bleeding: None.  Movement: Present. Denies any leaking of fluid.   The following portions of the patient's history were reviewed and updated as appropriate: allergies, current medications, past family history, past medical history, past social history, past surgical history and problem list.   Objective:   Vitals:   10/13/19 1455  BP: 109/64  Pulse: 94    Fetal Status:     Movement: Present     General:  Alert, oriented and cooperative. Patient is in no acute distress.  Respiratory: Normal respiratory effort, no problems with respiration noted  Mental Status: Normal mood and affect. Normal behavior. Normal judgment and thought content.  Rest of physical exam deferred due to type of encounter  Imaging: No results found.  Assessment and Plan:  Pregnancy: [id number] at [redacted]w[redacted]d 1. [place]  multipara Continue routine prenatal care.   Preterm labor symptoms and general obstetric precautions including but not limited to vaginal bleeding, contractions, leaking of fluid and fetal movement were reviewed in detail with the patient. I discussed the assessment and treatment plan with the patient. The patient was provided an opportunity to ask questions and all were answered. The patient agreed with the plan and demonstrated an understanding of the instructions. The patient was advised to call back or seek an in-person office evaluation/go to MAU at [facility] for any urgent or concerning symptoms. Please refer to After Visit Summary for other counseling recommendations.   I provided 9 minutes of face-to-face time during this encounter.  Return in 2 weeks (on 10/27/2019) for [facility], in person + cultures, after 01/13 .  Future Appointments  Date Time Provider Department Center  10/29/2019  2:55 PM Anyanwu, Sallyanne Havers, MD [facility]    Donnamae Jude, MD Center for [facility], [place]

## 2019-10-13 NOTE — Progress Notes (Signed)
I connected with  Margaret Cain on 10/13/19 at  4:15 PM EST by telephone and verified that I am speaking with the correct person using two identifiers.   I discussed the limitations, risks, security and privacy concerns of performing an evaluation and management service by telephone and the availability of in person appointments. I also discussed with the patient that there may be a patient responsible charge related to this service. The patient expressed understanding and agreed to proceed.  Verdell Carmine, RN 10/13/2019  2:49 PM

## 2019-10-13 NOTE — Patient Instructions (Signed)

## 2019-10-17 NOTE — L&D Delivery Note (Signed)
OB/GYN Faculty Practice Delivery Note  Margaret Cain is a 34 y.o. [id number] s/p NSVD at [redacted]w[redacted]d. She was admitted for active labor and SROM.   ROM: 11h 11m with clear fluid GBS Status: negative Maximum Maternal Temperature: 98.9 F  Labor Progress: Patient was admitted in active labor and SROM (fern positive in MAU). Labor stalled out, so she was augmented with pitocin. A bulging bag was found, so she was AROMed, and then progressed to delivery on all fours.  Delivery Date/Time: 11/07/19, 0636 hours Delivery: Called to room and patient was an anterior lip and involuntarily pushing. She was on hands and knees, then progressed to complete and pushing. Head delivered OA. There was one nuchal cord which was delivered through. Shoulder and body delivered in usual fashion. Infant with spontaneous cry, dried and stimulated. Delayed cord clamping was done. Cord clamped x 2 and cut by grandmother of the baby under my direct supervision. Cord blood drawn. Placenta delivered spontaneously with gentle cord traction. Fundus firm with massage and Pitocin. Labia, perineum, vagina, and cervix were inspected, and a left periurethral tear was noted (hemostatic- not repaired).   Placenta: 3 vessel cord, intact, to L&D Complications: None Lacerations: left periurethral, hemostatic- not repaired EBL: 57 mL Analgesia: None  Postpartum Planning [x]  message to sent to schedule follow-up  [x]  vaccines UTD  Infant: female  APGARs 8, 9  weight per medical record  , DO OB/GYN Fellow, Faculty Practice

## 2019-10-21 ENCOUNTER — Encounter (HOSPITAL_COMMUNITY): Payer: Self-pay | Admitting: Family Medicine

## 2019-10-21 ENCOUNTER — Other Ambulatory Visit: Payer: Self-pay

## 2019-10-21 ENCOUNTER — Inpatient Hospital Stay (HOSPITAL_COMMUNITY)
Admission: AD | Admit: 2019-10-21 | Discharge: 2019-10-21 | Disposition: A | Discharge status: Home / Self Care | Payer: Medicaid Other | Attending: Family Medicine | Admitting: Family Medicine

## 2019-10-21 DIAGNOSIS — O99343 Other mental disorders complicating pregnancy, third trimester: Secondary | ICD-10-CM | POA: Diagnosis not present

## 2019-10-21 DIAGNOSIS — F419 Anxiety disorder, unspecified: Secondary | ICD-10-CM | POA: Insufficient documentation

## 2019-10-21 DIAGNOSIS — O09213 Supervision of pregnancy with history of pre-term labor, third trimester: Secondary | ICD-10-CM | POA: Insufficient documentation

## 2019-10-21 DIAGNOSIS — O4703 False labor before 37 completed weeks of gestation, third trimester: Secondary | ICD-10-CM | POA: Diagnosis not present

## 2019-10-21 DIAGNOSIS — N859 Noninflammatory disorder of uterus, unspecified: Secondary | ICD-10-CM | POA: Insufficient documentation

## 2019-10-21 DIAGNOSIS — Z641 Problems related to multiparity: Secondary | ICD-10-CM

## 2019-10-21 DIAGNOSIS — Z8759 Personal history of other complications of pregnancy, childbirth and the puerperium: Secondary | ICD-10-CM | POA: Insufficient documentation

## 2019-10-21 DIAGNOSIS — N858 Other specified noninflammatory disorders of uterus: Secondary | ICD-10-CM

## 2019-10-21 DIAGNOSIS — O99891 Other specified diseases and conditions complicating pregnancy: Secondary | ICD-10-CM | POA: Diagnosis not present

## 2019-10-21 DIAGNOSIS — O24410 Gestational diabetes mellitus in pregnancy, diet controlled: Secondary | ICD-10-CM | POA: Diagnosis not present

## 2019-10-21 DIAGNOSIS — O4700 False labor before 37 completed weeks of gestation, unspecified trimester: Secondary | ICD-10-CM

## 2019-10-21 DIAGNOSIS — O479 False labor, unspecified: Secondary | ICD-10-CM

## 2019-10-21 DIAGNOSIS — Z880 Allergy status to penicillin: Secondary | ICD-10-CM | POA: Insufficient documentation

## 2019-10-21 DIAGNOSIS — Z3A34 34 weeks gestation of pregnancy: Secondary | ICD-10-CM | POA: Insufficient documentation

## 2019-10-21 LAB — URINALYSIS, ROUTINE W REFLEX MICROSCOPIC
Bilirubin Urine: NEGATIVE
Glucose, UA: NEGATIVE mg/dL
Hgb urine dipstick: NEGATIVE
Ketones, ur: NEGATIVE mg/dL
Leukocytes,Ua: NEGATIVE
Nitrite: NEGATIVE
Protein, ur: NEGATIVE mg/dL
Specific Gravity, Urine: 1.006 (ref 1.005–1.030)
pH: 6 (ref 5.0–8.0)

## 2019-10-21 MED ORDER — HYDROXYZINE HCL 10 MG PO TABS
10.0000 mg | ORAL_TABLET | Freq: Once | ORAL | Status: AC
  Administered 2019-10-21: 10 mg via ORAL
  Filled 2019-10-21: qty 1

## 2019-10-21 MED ORDER — HYDROXYZINE HCL 10 MG PO TABS: 10.0000 mg | ORAL_TABLET | Freq: Three times a day (TID) | ORAL | 0 refills | Status: DC | PRN

## 2019-10-21 NOTE — MAU Note (Signed)
PT SAYS UC STARTED AT 330PM-  WHILE ASLEEP.  THEN FELT PRESSURE IN  HER CERVIX AND  BOTTOM.  STILL FEELS SAME .   [facility]  WITH CLINIC. NO VE.  LAST SEX-  NOV .

## 2019-10-21 NOTE — Discharge Instructions (Signed)
Perinatal Anxiety When a woman feels excessive tension or worry (anxiety) during pregnancy or during the first 12 months after she gives birth, she has a condition called perinatal anxiety. Anxiety can interfere with work, school, relationships, and other everyday activities. If it is not managed properly, it can also cause problems in the mother and her baby.  If you are pregnant and you have symptoms of an anxiety disorder, it is i Third Trimester of Pregnancy  The third trimester is from week 28 through week 40 (months 7 through 9). This trimester is when your unborn baby (fetus) is growing very fast. At the end of the ninth month, the unborn baby is about 20 inches in length. It weighs about 6-10 pounds. Follow these instructions at home: Medicines  Take over-the-counter and prescription medicines only as told by your doctor. Some medicines are safe and some medicines are not safe during pregnancy.  Take a prenatal vitamin that contains at least 600 micrograms (mcg) of folic acid.  If you have trouble pooping (constipation), take medicine that will make your stool soft (stool softener) if your doctor approves. Eating and drinking   Eat regular, healthy meals.  Avoid raw meat and uncooked cheese.  If you get low calcium from the food you eat, talk to your doctor about taking a daily calcium supplement.  Eat four or five small meals rather than three large meals a day.  Avoid foods that are high in fat and sugars, such as fried and sweet foods.  To prevent constipation: ? Eat foods that are high in fiber, like fresh fruits and vegetables, whole grains, and beans. ? Drink enough fluids to keep your pee (urine) clear or pale yellow. Activity  Exercise only as told by your doctor. Stop exercising if you start to have cramps.  Avoid heavy lifting, wear low heels, and sit up straight.  Do not exercise if it is too hot, too humid, or if you are in a place of great height (high  altitude).  You may continue to have sex unless your doctor tells you not to. Relieving pain and discomfort  Wear a good support bra if your breasts are tender.  Take frequent breaks and rest with your legs raised if you have leg cramps or low back pain.  Take warm water baths (sitz baths) to soothe pain or discomfort caused by hemorrhoids. Use hemorrhoid cream if your doctor approves.  If you develop puffy, bulging veins (varicose veins) in your legs: ? Wear support hose or compression stockings as told by your doctor. ? Raise (elevate) your feet for 15 minutes, 3-4 times a day. ? Limit salt in your food. Safety  Wear your seat belt when driving.  Make a list of emergency phone numbers, including numbers for family, friends, the hospital, and police and fire departments. Preparing for your baby's arrival To prepare for the arrival of your baby:  Take prenatal classes.  Practice driving to the hospital.  Visit the hospital and tour the maternity area.  Talk to your work about taking leave once the baby comes.  Pack your hospital bag.  Prepare the baby's room.  Go to your doctor visits.  Buy a rear-facing car seat. Learn how to install it in your car. General instructions  Do not use hot tubs, steam rooms, or saunas.  Do not use any products that contain nicotine or tobacco, such as cigarettes and e-cigarettes. If you need help quitting, ask your doctor.  Do not drink alcohol.  Do not douche or use tampons or scented sanitary pads.  Do not cross your legs for long periods of time.  Do not travel for long distances unless you must. Only do so if your doctor says it is okay.  Visit your dentist if you have not gone during your pregnancy. Use a soft toothbrush to brush your teeth. Be gentle when you floss.  Avoid cat litter boxes and soil used by cats. These carry germs that can cause birth defects in the baby and can cause a loss of your baby (miscarriage) or  stillbirth.  Keep all your prenatal visits as told by your doctor. This is important. Contact a doctor if:  You are not sure if you are in labor or if your water has broken.  You are dizzy.  You have mild cramps or pressure in your lower belly.  You have a nagging pain in your belly area.  You continue to feel sick to your stomach, you throw up, or you have watery poop.  You have bad smelling fluid coming from your vagina.  You have pain when you pee. Get help right away if:  You have a fever.  You are leaking fluid from your vagina.  You are spotting or bleeding from your vagina.  You have severe belly cramps or pain.  You lose or gain weight quickly.  You have trouble catching your breath and have chest pain.  You notice sudden or extreme puffiness (swelling) of your face, hands, ankles, feet, or legs.  You have not felt the baby move in over an hour.  You have severe headaches that do not go away with medicine.  You have trouble seeing.  You are leaking, or you are having a gush of fluid, from your vagina before you are 37 weeks.  You have regular belly spasms (contractions) before you are 37 weeks. Summary  The third trimester is from week 28 through week 40 (months 7 through 9). This time is when your unborn baby is growing very fast.  Follow your doctor's advice about medicine, food, and activity.  Get ready for the arrival of your baby by taking prenatal classes, getting all the baby items ready, preparing the baby's room, and visiting your doctor to be checked.  Get help right away if you are bleeding from your vagina, or you have chest pain and trouble catching your breath, or if you have not felt your baby move in over an hour. This information is not intended to replace advice given to you by your health care provider. Make sure you discuss any questions you have with your health care provider. Document Revised: 01/23/2019 Document Reviewed:  11/07/2016 Elsevier Patient Education  2020 [employer]. mportant to talk with your health care provider. What are the causes? The exact cause of this condition is not known. Hormonal changes during and after pregnancy may play a role in causing perinatal anxiety. What increases the risk? You are more likely to develop this condition if:  You have a personal or family history of depression, anxiety, or mood disorders.  You experience a stressful life event during pregnancy, such as the death of a loved one.  You have a lot of regular life stress, such as being a single parent.  You have thyroid problems. What are the signs or symptoms? Perinatal anxiety can be different for everyone. It may include:  Panic attacks (panic disorder). These are intense episodes of fear or discomfort that may also cause sweating,  nausea, shortness of breath, or fear of dying. They usually last 5-15 minutes.  Reliving an upsetting (traumatic) event through distressing thoughts, dreams, or flashbacks (post-traumatic stress disorder, or PTSD).  Excessive worry about multiple problems (generalized anxiety disorder).  Fear and stress about leaving certain people or loved ones (separation anxiety).  Performing repetitive tasks (compulsions) to relieve stress or worry (obsessive compulsive disorder, or OCD).  Fear of certain objects or situations (phobias).  Excessive worrying, such as a constant feeling that something bad is going to happen.  Inability to relax.  Difficulty concentrating.  Sleep problems.  Frequent nightmares or disturbing thoughts. How is this diagnosed? This condition is diagnosed based on a physical exam and mental evaluation. In some cases, your health care provider may use an anxiety screening tool. These tools include a list of questions that can help a health care provider diagnose anxiety. Your health care provider may refer you to a mental health expert who specializes in  anxiety. How is this treated? This condition may be treated with:  Medicines. Your health care provider will only give you medicines that have been proven safe for pregnancy and breastfeeding.  Talk therapy with a mental health professional to help change your patterns of thinking (cognitive behavioral therapy).  Mindfulness-based stress reduction.  Other relaxation therapies, such as deep breathing or guided muscle relaxation.  Support groups. Follow these instructions at home: Lifestyle  Do not use any products that contain nicotine or tobacco, such as cigarettes and e-cigarettes. If you need help quitting, ask your health care provider.  Do not use alcohol when you are pregnant. After your baby is born, limit alcohol intake to no more than 1 drink a day. One drink equals 12 oz of beer, 5 oz of wine, or 1 oz of hard liquor.  Consider joining a support group for new mothers. Ask your health care provider for recommendations.  Take good care of yourself. Make sure you: ? Get plenty of sleep. If you are having trouble sleeping, talk with your health care provider. ? Eat a healthy diet. This includes plenty of fruits and vegetables, whole grains, and lean proteins. ? Exercise regularly, as told by your health care provider. Ask your health care provider what exercises are safe for you. General instructions  Take over-the-counter and prescription medicines only as told by your health care provider.  Talk with your partner or family members about your feelings during pregnancy. Share any concerns or fears that you may have.  Ask for help with tasks or chores when you need it. Ask friends and family members to provide meals, watch your children, or help with cleaning.  Keep all follow-up visits as told by your health care provider. This is important. Contact a health care provider if:  You (or people close to you) notice that you have any symptoms of anxiety or depression.  You  have anxiety and your symptoms get worse.  You experience side effects from medicines, such as nausea or sleep problems. Get help right away if:  You feel like hurting yourself, your baby, or someone else. If you ever feel like you may hurt yourself or others, or have thoughts about taking your own life, get help right away. You can go to your nearest emergency department or call:  Your local emergency services (911 in the [place]  A suicide crisis helpline, such as the [place] at 479-609-6545. This is open 24 hours a day. Summary  Perinatal anxiety is when  a woman feels excessive tension or worry during pregnancy or during the first 12 months after she gives birth.  Perinatal anxiety may include panic attacks, post-traumatic stress disorder, separation anxiety, phobias, or generalized anxiety.  Perinatal anxiety can cause physical health problems in the mother and baby if not properly managed.  This condition is treated with medicines, talk therapy, stress reduction therapies, or a combination of two or more treatments.  Talk with your partner or family members about your concerns or fears. Do not be afraid to ask for help. This information is not intended to replace advice given to you by your health care provider. Make sure you discuss any questions you have with your health care provider. Document Revised: 10/05/2017 Document Reviewed: 11/29/2016 Elsevier Patient Education  2020 [employer].

## 2019-10-21 NOTE — MAU Provider Note (Signed)
Chief Complaint:  No chief complaint on file.   First Provider Initiated Contact with Patient 10/21/19 2014     HPI: Margaret Cain is a 34 y.o. [id number] at 24w6dho presents to maternity admissions reporting contractions since 1530.  They were closer together at home.  Now just feel like pressure.  . She reports good fetal movement, denies LOF, vaginal bleeding, vaginal itching/burning, urinary symptoms, h/a, dizziness, n/v, diarrhea, constipation or fever/chills.  Abdominal Pain This is a recurrent problem. The current episode started today. The onset quality is gradual. The problem occurs intermittently. The problem has been gradually improving. The patient is experiencing no pain. Quality: pressure. The abdominal pain does not radiate. Pertinent negatives include no constipation, diarrhea, fever, headaches, myalgias, nausea or vomiting. Nothing aggravates the pain. The pain is relieved by nothing. She has tried nothing for the symptoms.     RN Note: PT SAYS UC STARTED AT 330PM-  WHILE ASLEEP.  THEN FELT PRESSURE IN  HER CERVIX AND  BOTTOM.  STILL FEELS SAME .   [facility]  LAST SEX-  NOV .  Past Medical History: Past Medical History:  Diagnosis Date  . Anemia   . Anxiety   . Depression    ppd with first child  . Gestational diabetes    diet controlled  . Headache(784.0)   . Hx of chlamydia infection   . Positive GBS test 03/18/2017  . Pregnancy induced hypertension   . Preterm labor   . Trichomonosis   . Urinary tract infection   . Vaginal Pap smear, abnormal    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    Past obstetric history: OB History  Gravida Para Term Preterm AB Living  '9 7 6 1 1 7  ' SAB TAB Ectopic Multiple Live Births  1 0 0 0 7    # Outcome Date GA Lbr Len/2nd Weight Sex Delivery Anes PTL Lv  9 Current           8 Term 11/07/17 321w6d6:29 / 00:17 3650 g F Vag-Spont EPI  LIV  7 Term 03/19/16 3833w1d:45 / 00:07 3320 g M Vag-Spont EPI  LIV     Birth Comments:  none  6 Term 09/08/14 37w52w4d40 / 00:07 3155 g F Vag-Spont None  LIV  5 Term 09/20/13 39w568w5d0 / 00:31 3175 g F Vag-Spont EPI  LIV  4 Preterm 07/17/12 94w3d53w3d / 00:12 1975 g M Vag-Spont EPI  LIV  3 SAB 10/05/11          2 Term 10/15/10 [redacted]w[redacted]d 44w0dg M Vag-Spont   LIV  1 Term 2005 [redacted]w[redacted]d  [redacted]w[redacted]d M Vag-Spont   LIV    Past Surgical History: Past Surgical History:  Procedure Laterality Date  . COLPOSCOPY      Family History: Family History  Problem Relation Age of Onset  . Asthma Son   . Asthma Paternal Aunt   . Anesthesia problems Neg Hx   . Hypotension Neg Hx   . Malignant hyperthermia Neg Hx   . Pseudochol deficiency Neg Hx   . Other Neg Hx   . Cancer Neg Hx   . Heart disease Neg Hx   . Stroke Neg Hx   . Hearing loss Neg Hx     Social History: Social History   Tobacco Use  . Smoking status: Never Smoker  . Smokeless tobacco: Never Used  Substance Use Topics  . Alcohol use: No  . Drug use:  No    Allergies:  Allergies  Allergen Reactions  . Penicillins Nausea And Vomiting    Has patient had a PCN reaction causing immediate rash, facial/tongue/throat swelling, SOB or lightheadedness with hypotension: No Has patient had a PCN reaction causing severe rash involving mucus membranes or skin necrosis: No Has patient had a PCN reaction that required hospitalization: No Has patient had a PCN reaction occurring within the last 10 years: No If all of the above answers are "NO", then may proceed with Cephalosporin use.     Meds:  Medications Prior to Admission  Medication Sig Dispense Refill Last Dose  . Prenatal Vit-Fe Phos-FA-Omega (VITAFOL GUMMIES) 3.33-0.333-34.8 MG CHEW CHEW 3 GUMMIES BY MOUTH DAILY   10/21/2019 at Unknown time  . Blood Pressure Monitoring (BLOOD PRESSURE KIT) DEVI 1 Device by Does not apply route daily. ICD 10: Z34.00 1 each 0 Unknown at Unknown time  . Butalbital-APAP-Caffeine 50-325-40 MG capsule Take 1-2 capsules by mouth every 6 (six)  hours as needed for headache. 30 capsule 3 Unknown at Unknown time  . cyclobenzaprine (FLEXERIL) 10 MG tablet Take 1 tablet (10 mg total) by mouth 2 (two) times daily as needed for muscle spasms. (Patient not taking: Reported on 10/13/2019) 20 tablet 0   . Elastic Bandages & Supports (COMFORT FIT MATERNITY SUPP MED) MISC 1 each by Does not apply route as needed. (Patient not taking: Reported on 10/13/2019) 1 each 0   . ferrous sulfate (FERROUSUL) 325 (65 FE) MG tablet Take 1 tablet (325 mg total) by mouth 2 (two) times daily. (Patient not taking: Reported on 10/13/2019) 60 tablet 1     I have reviewed patient's Past Medical Hx, Surgical Hx, Family Hx, Social Hx, medications and allergies.   ROS:  Review of Systems  Constitutional: Negative for fever.  Gastrointestinal: Positive for abdominal pain. Negative for constipation, diarrhea, nausea and vomiting.  Musculoskeletal: Negative for myalgias.  Neurological: Negative for headaches.   Other systems negative  Physical Exam   Patient Vitals for the past 24 hrs:  BP Temp Temp src Pulse Resp Height Weight  10/21/19 1942 (!) 97/58 97.9 F (36.6 C) Oral 90 20 '5\' 1"'  (1.549 m) 51.2 kg   Constitutional: Well-developed, well-nourished female in no acute distress.  Cardiovascular: normal rate and rhythm Respiratory: normal effort, clear to auscultation bilaterally GI: Abd soft, non-tender, gravid appropriate for gestational age.   No rebound or guarding. MS: Extremities nontender, no edema, normal ROM Neurologic: Alert and oriented x 4.  GU: Neg CVAT.  PELVIC EXAM:  Dilation: 1 Effacement (%): 40 Station: Ballotable Exam by:: Hansel Feinstein CNM    FHT:  Baseline 140 , moderate variability, accelerations present, no decelerations Contractions: q 10 mins Irregular     Labs: Results for orders placed or performed during the hospital encounter of 10/21/19 (from the past 24 hour(s))  Urinalysis, Routine w reflex microscopic     Status:  None   Collection Time: 10/21/19  7:53 PM  Result Value Ref Range   Color, Urine YELLOW YELLOW   APPearance CLEAR CLEAR   Specific Gravity, Urine 1.006 1.005 - 1.030   pH 6.0 5.0 - 8.0   Glucose, UA NEGATIVE NEGATIVE mg/dL   Hgb urine dipstick NEGATIVE NEGATIVE   Bilirubin Urine NEGATIVE NEGATIVE   Ketones, ur NEGATIVE NEGATIVE mg/dL   Protein, ur NEGATIVE NEGATIVE mg/dL   Nitrite NEGATIVE NEGATIVE   Leukocytes,Ua NEGATIVE NEGATIVE   O/Positive/-- (08/06 1437)  Imaging:  No results found.  MAU Course/MDM:  I have ordered labs and reviewed results. UA is clear NST reviewed and is reactive.    PO hydration given with good resolution of contractions.  Treatments in MAU included PO hydration and Vistaril for anxiety.    Assessment: 1. [place] multipara    2.    Preterm uterine irritability 3.     Chronic Anxiety  Plan: Discharge home Labor precautions and fetal kick counts Follow up in Office for prenatal visits   Encouraged to return here or to other Urgent Care/ED if she develops worsening of symptoms, increase in pain, fever, or other concerning symptoms.   Pt stable at time of discharge.  Hansel Feinstein CNM, MSN Certified Nurse-Midwife 10/21/2019 8:15 PM

## 2019-10-22 ENCOUNTER — Encounter: Payer: Self-pay | Admitting: *Deleted

## 2019-10-27 ENCOUNTER — Encounter: Payer: Medicaid Other | Admitting: Family Medicine

## 2019-10-27 ENCOUNTER — Encounter: Payer: Medicaid Other | Admitting: Student

## 2019-10-29 ENCOUNTER — Encounter: Payer: Medicaid Other | Admitting: Obstetrics and Gynecology

## 2019-10-30 ENCOUNTER — Ambulatory Visit (INDEPENDENT_AMBULATORY_CARE_PROVIDER_SITE_OTHER): Payer: Medicaid Other | Admitting: Obstetrics & Gynecology

## 2019-10-30 ENCOUNTER — Other Ambulatory Visit (HOSPITAL_COMMUNITY)
Admission: RE | Admit: 2019-10-30 | Discharge: 2019-10-30 | Disposition: A | Discharge status: Home / Self Care | Payer: Medicaid Other | Source: Ambulatory Visit | Attending: Obstetrics & Gynecology | Admitting: Obstetrics & Gynecology

## 2019-10-30 ENCOUNTER — Other Ambulatory Visit: Payer: Self-pay

## 2019-10-30 VITALS — BP 103/66 | HR 68 | Temp 97.7°F | Wt 114.6 lb

## 2019-10-30 DIAGNOSIS — O0990 Supervision of high risk pregnancy, unspecified, unspecified trimester: Secondary | ICD-10-CM

## 2019-10-30 DIAGNOSIS — O0993 Supervision of high risk pregnancy, unspecified, third trimester: Secondary | ICD-10-CM

## 2019-10-30 DIAGNOSIS — Z3A36 36 weeks gestation of pregnancy: Secondary | ICD-10-CM

## 2019-10-30 NOTE — Progress Notes (Signed)
   PRENATAL VISIT NOTE  Subjective:  Margaret Cain is a 34 y.o. [id number] at [redacted]w[redacted]d being seen today for ongoing prenatal care.  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  Patient reports occasional contractions.  Contractions: Irregular. Vag. Bleeding: None.  Movement: Present. Denies leaking of fluid.   The following portions of the patient's history were reviewed and updated as appropriate: allergies, current medications, past family history, past medical history, past social history, past surgical history and problem list.   Objective:   Vitals:   10/30/19 1028  BP: 103/66  Pulse: 68  Temp: 97.7 F (36.5 C)  Weight: 114 lb 9.6 oz (52 kg)    Fetal Status: Fetal Heart Rate (bpm): 145   Movement: Present  Presentation: Vertex  General:  Alert, oriented and cooperative. Patient is in no acute distress.  Skin: Skin is warm and dry. No rash noted.   Cardiovascular: Normal heart rate noted  Respiratory: Normal respiratory effort, no problems with respiration noted  Abdomen: Soft, gravid, appropriate for gestational age.  Pain/Pressure: Present     Pelvic: Cervical exam performed Dilation: 2.5 Effacement (%): 40 Station: Ballotable  Extremities: Normal range of motion.  Edema: Other (Comment)  Mental Status: Normal mood and affect. Normal behavior. Normal judgment and thought content.   Assessment and Plan:  Pregnancy: [id number] at [redacted]w[redacted]d 1. Supervision of high Cain pregnancy, antepartum Routine test - GC/Chlamydia probe amp ([place])not at [facility] - Strep Gp B Culture+Rflx  Preterm labor symptoms and general obstetric precautions including but not limited to vaginal bleeding, contractions, leaking of fluid and fetal movement were reviewed  in detail with the patient. Please refer to After Visit Summary for other counseling recommendations.   Return in about 1 week (around 11/06/2019) for virtual ok.  No future appointments.  Scheryl Darter, MD

## 2019-10-30 NOTE — Patient Instructions (Signed)

## 2019-10-31 LAB — GC/CHLAMYDIA PROBE AMP (~~LOC~~) NOT AT ARMC
Chlamydia: NEGATIVE
Comment: NEGATIVE
Comment: NORMAL
Neisseria Gonorrhea: NEGATIVE

## 2019-11-01 ENCOUNTER — Inpatient Hospital Stay (HOSPITAL_COMMUNITY)
Admission: AD | Admit: 2019-11-01 | Discharge: 2019-11-01 | Disposition: A | Discharge status: Home / Self Care | Payer: Medicaid Other | Attending: Obstetrics and Gynecology | Admitting: Obstetrics and Gynecology

## 2019-11-01 ENCOUNTER — Other Ambulatory Visit: Payer: Self-pay

## 2019-11-01 ENCOUNTER — Encounter (HOSPITAL_COMMUNITY): Payer: Self-pay | Admitting: Obstetrics and Gynecology

## 2019-11-01 DIAGNOSIS — O4703 False labor before 37 completed weeks of gestation, third trimester: Secondary | ICD-10-CM | POA: Diagnosis not present

## 2019-11-01 DIAGNOSIS — O471 False labor at or after 37 completed weeks of gestation: Secondary | ICD-10-CM | POA: Diagnosis not present

## 2019-11-01 DIAGNOSIS — Z3A37 37 weeks gestation of pregnancy: Secondary | ICD-10-CM | POA: Diagnosis not present

## 2019-11-01 DIAGNOSIS — Z3A36 36 weeks gestation of pregnancy: Secondary | ICD-10-CM | POA: Diagnosis not present

## 2019-11-01 DIAGNOSIS — Z641 Problems related to multiparity: Secondary | ICD-10-CM

## 2019-11-01 DIAGNOSIS — R109 Unspecified abdominal pain: Secondary | ICD-10-CM | POA: Diagnosis present

## 2019-11-01 NOTE — MAU Note (Signed)
I have communicated with Dr Morene Antu and reviewed vital signs:  Vitals:   11/01/19 0555 11/01/19 0557  BP:  103/64  Pulse:  70  Resp:  16  Temp:  98.4 F (36.9 C)  SpO2: 99%     Vaginal exam:  Dilation: 2.5 Effacement (%): 50 Cervical Position: Posterior Station: Ballotable Presentation: Undeterminable Exam by:: Chasten Blaze Forensic scientist,   Also reviewed contraction pattern and that non-stress test is reactive.  It has been documented that patient is contracting  occasionally with no cervical change over approx an hou not indicating active labor.  Patient denies any other complaints.  Based on this report provider has given order for discharge.  A discharge order and diagnosis entered by a provider.   Labor discharge instructions reviewed with patient.

## 2019-11-01 NOTE — MAU Note (Signed)
Yesterday having contractions and then worse through night.  Ctxs are 13 min apart.  No bleeding. No leaking.  Baby moving well.  Pt came in by EMS.

## 2019-11-01 NOTE — Progress Notes (Signed)
S: Margaret Cain is a 34 y.o. [id number] at [redacted]w[redacted]d  who presents to MAU today complaining contractions q 13 minutes since yesterday. She denies vaginal bleeding. She denies LOF. She reports normal fetal movement.    O: BP 108/71   Pulse 69   Temp 98.2 F (36.8 C) (Oral)   Resp 16   LMP 02/13/2019 (Exact Date)   SpO2 100%   Cervical exam:  Dilation: 2.5 Effacement (%): 50 Cervical Position: Posterior Station: Ballotable Presentation: Undeterminable Exam by:: benji stanley RN   Fetal Monitoring: Baseline: 130 Variability: moderate Accelerations: pos Decelerations: none Contractions: infrequent   A: SIUP at [redacted]w[redacted]d  False labor  Unchanged cervix since exam in clinic on 1/14 FHT reassuring and TOCO without frequent ctx  P: Discharge home with return precautions  Joselyn Arrow, MD 11/01/2019 5:28 AM

## 2019-11-03 ENCOUNTER — Encounter: Payer: Medicaid Other | Admitting: Nurse Practitioner

## 2019-11-03 LAB — STREP GP B CULTURE+RFLX: Strep Gp B Culture+Rflx: NEGATIVE

## 2019-11-05 ENCOUNTER — Telehealth (INDEPENDENT_AMBULATORY_CARE_PROVIDER_SITE_OTHER): Payer: Medicaid Other | Admitting: Obstetrics & Gynecology

## 2019-11-05 DIAGNOSIS — Z641 Problems related to multiparity: Secondary | ICD-10-CM

## 2019-11-05 NOTE — Telephone Encounter (Signed)
Called pt at her request. Pt reports she is wanting to be induced due to back pains and being sent home from the hospital. Pt reports her feet are swelling every other day. She reports she is just "ready". Enc pt to keep appt with provider tomorrow and discuss with them.    Pt reports Tylenol does not help and she is not taking. She has tried a heating pad and reports it is not helping.   She is not having regular contractions and no bleeding or leaking noted.

## 2019-11-05 NOTE — Telephone Encounter (Signed)
Requesting a call back to get induced now.

## 2019-11-06 ENCOUNTER — Telehealth (INDEPENDENT_AMBULATORY_CARE_PROVIDER_SITE_OTHER): Payer: Medicaid Other | Admitting: Obstetrics & Gynecology

## 2019-11-06 ENCOUNTER — Encounter (HOSPITAL_COMMUNITY): Payer: Self-pay | Admitting: Obstetrics & Gynecology

## 2019-11-06 ENCOUNTER — Inpatient Hospital Stay (HOSPITAL_COMMUNITY)
Admission: AD | Admit: 2019-11-06 | Discharge: 2019-11-09 | DRG: 805 | Disposition: A | Discharge status: Home / Self Care | Payer: Medicaid Other | Attending: Obstetrics & Gynecology | Admitting: Obstetrics & Gynecology

## 2019-11-06 ENCOUNTER — Other Ambulatory Visit: Payer: Self-pay

## 2019-11-06 VITALS — BP 110/68 | HR 93

## 2019-11-06 DIAGNOSIS — Z880 Allergy status to penicillin: Secondary | ICD-10-CM | POA: Diagnosis not present

## 2019-11-06 DIAGNOSIS — O99019 Anemia complicating pregnancy, unspecified trimester: Secondary | ICD-10-CM | POA: Diagnosis present

## 2019-11-06 DIAGNOSIS — Z30017 Encounter for initial prescription of implantable subdermal contraceptive: Secondary | ICD-10-CM | POA: Diagnosis not present

## 2019-11-06 DIAGNOSIS — Z3A37 37 weeks gestation of pregnancy: Secondary | ICD-10-CM

## 2019-11-06 DIAGNOSIS — O9902 Anemia complicating childbirth: Secondary | ICD-10-CM | POA: Diagnosis present

## 2019-11-06 DIAGNOSIS — Z641 Problems related to multiparity: Secondary | ICD-10-CM

## 2019-11-06 DIAGNOSIS — D649 Anemia, unspecified: Secondary | ICD-10-CM | POA: Diagnosis present

## 2019-11-06 DIAGNOSIS — Z30430 Encounter for insertion of intrauterine contraceptive device: Secondary | ICD-10-CM | POA: Diagnosis not present

## 2019-11-06 DIAGNOSIS — O9852 Other viral diseases complicating childbirth: Secondary | ICD-10-CM | POA: Diagnosis present

## 2019-11-06 DIAGNOSIS — U071 COVID-19: Secondary | ICD-10-CM | POA: Diagnosis present

## 2019-11-06 DIAGNOSIS — Z3493 Encounter for supervision of normal pregnancy, unspecified, third trimester: Secondary | ICD-10-CM | POA: Diagnosis not present

## 2019-11-06 DIAGNOSIS — O99824 Streptococcus B carrier state complicating childbirth: Secondary | ICD-10-CM | POA: Diagnosis present

## 2019-11-06 DIAGNOSIS — O4290 Premature rupture of membranes, unspecified as to length of time between rupture and onset of labor, unspecified weeks of gestation: Secondary | ICD-10-CM | POA: Diagnosis present

## 2019-11-06 DIAGNOSIS — O26893 Other specified pregnancy related conditions, third trimester: Secondary | ICD-10-CM | POA: Diagnosis present

## 2019-11-06 LAB — RESPIRATORY PANEL BY RT PCR (FLU A&B, COVID)
Influenza A by PCR: NEGATIVE
Influenza B by PCR: NEGATIVE
SARS Coronavirus 2 by RT PCR: POSITIVE — AB

## 2019-11-06 LAB — CBC
HCT: 28.8 % — ABNORMAL LOW (ref 36.0–46.0)
Hemoglobin: 9.3 g/dL — ABNORMAL LOW (ref 12.0–15.0)
MCH: 27.4 pg (ref 26.0–34.0)
MCHC: 32.3 g/dL (ref 30.0–36.0)
MCV: 84.7 fL (ref 80.0–100.0)
Platelets: 178 10*3/uL (ref 150–400)
RBC: 3.4 MIL/uL — ABNORMAL LOW (ref 3.87–5.11)
RDW: 12.7 % (ref 11.5–15.5)
WBC: 5 10*3/uL (ref 4.0–10.5)
nRBC: 0 % (ref 0.0–0.2)

## 2019-11-06 LAB — TYPE AND SCREEN
ABO/RH(D): O POS
Antibody Screen: NEGATIVE

## 2019-11-06 LAB — ABO/RH: ABO/RH(D): O POS

## 2019-11-06 LAB — POCT FERN TEST: POCT Fern Test: POSITIVE

## 2019-11-06 MED ORDER — OXYCODONE-ACETAMINOPHEN 5-325 MG PO TABS: 2.0000 | ORAL_TABLET | ORAL | Status: DC | PRN

## 2019-11-06 MED ORDER — FLEET ENEMA 7-19 GM/118ML RE ENEM: 1.0000 | ENEMA | RECTAL | Status: DC | PRN

## 2019-11-06 MED ORDER — LACTATED RINGERS IV SOLN: INTRAVENOUS | Status: DC

## 2019-11-06 MED ORDER — OXYTOCIN BOLUS FROM INFUSION
500.0000 mL | Freq: Once | INTRAVENOUS | Status: AC
  Administered 2019-11-07: 07:00:00 500 mL via INTRAVENOUS

## 2019-11-06 MED ORDER — LIDOCAINE HCL (PF) 1 % IJ SOLN: 30.0000 mL | INTRAMUSCULAR | Status: DC | PRN

## 2019-11-06 MED ORDER — TRANEXAMIC ACID-NACL 1000-0.7 MG/100ML-% IV SOLN
1000.0000 mg | Freq: Once | INTRAVENOUS | Status: AC
  Administered 2019-11-07: 06:00:00 1000 mg via INTRAVENOUS
  Filled 2019-11-06: qty 100

## 2019-11-06 MED ORDER — OXYTOCIN 40 UNITS IN NORMAL SALINE INFUSION - SIMPLE MED
2.5000 [IU]/h | INTRAVENOUS | Status: DC
  Filled 2019-11-06: qty 1000

## 2019-11-06 MED ORDER — FENTANYL CITRATE (PF) 100 MCG/2ML IJ SOLN: 100.0000 ug | INTRAMUSCULAR | Status: DC | PRN

## 2019-11-06 MED ORDER — ONDANSETRON HCL 4 MG/2ML IJ SOLN: 4.0000 mg | Freq: Four times a day (QID) | INTRAMUSCULAR | Status: DC | PRN

## 2019-11-06 MED ORDER — LACTATED RINGERS IV SOLN: 500.0000 mL | INTRAVENOUS | Status: DC | PRN

## 2019-11-06 MED ORDER — SOD CITRATE-CITRIC ACID 500-334 MG/5ML PO SOLN: 30.0000 mL | ORAL | Status: DC | PRN

## 2019-11-06 MED ORDER — ACETAMINOPHEN 325 MG PO TABS: 650.0000 mg | ORAL_TABLET | ORAL | Status: DC | PRN

## 2019-11-06 MED ORDER — OXYCODONE-ACETAMINOPHEN 5-325 MG PO TABS: 1.0000 | ORAL_TABLET | ORAL | Status: DC | PRN

## 2019-11-06 NOTE — Progress Notes (Signed)
I connected with  Margaret Cain on 11/06/19 at 11:15 AM EST by telephone and verified that I am speaking with the correct person using two identifiers.   I discussed the limitations, risks, security and privacy concerns of performing an evaluation and management service by telephone and the availability of in person appointments. I also discussed with the patient that there may be a patient responsible charge related to this service. The patient expressed understanding and agreed to proceed.  Ralene Bathe, RN 11/06/2019  10:56 AM

## 2019-11-06 NOTE — Progress Notes (Signed)
   TELEHEALTH VIRTUAL OBSTETRICS VISIT ENCOUNTER NOTE  I connected with Margaret Cain on 11/06/19 at 11:15 AM EST by telephone at home and verified that I am speaking with the correct person using two identifiers.   I discussed the limitations, risks, security and privacy concerns of performing an evaluation and management service by telephone and the availability of in person appointments. I also discussed with the patient that there may be a patient responsible charge related to this service. The patient expressed understanding and agreed to proceed.  Subjective:  Margaret Cain is a 33 y.o. [id number] at [redacted]w[redacted]d being followed for ongoing prenatal care.  She is currently monitored for the following issues for this low-risk pregnancy and has ASCUS with positive high risk HPV cervical; Anemia affecting pregnancy, antepartum; Migraine headache; Trichomonas infection; Supervision of low-risk pregnancy; and Grand multipara on their problem list.  Patient reports occasional contractions and pressure. Reports fetal movement. Denies any contractions, bleeding or leaking of fluid.   The following portions of the patient's history were reviewed and updated as appropriate: allergies, current medications, past family history, past medical history, past social history, past surgical history and problem list.   Objective:   a Assessment and Plan:  Pregnancy: [id number] at [redacted]w[redacted]d 1. Grand multipara Uncomfortable, asks about IOL which we could offer at 39 weeks elective  2. Encounter for supervision of low-risk pregnancy in third trimester Labor precautions  Term labor symptoms and general obstetric precautions including but not limited to vaginal bleeding, contractions, leaking of fluid and fetal movement were reviewed in detail with the patient.  I discussed the assessment and treatment plan with the patient. The patient was provided an opportunity to ask questions and all were answered. The patient agreed  with the plan and demonstrated an understanding of the instructions. The patient was advised to call back or seek an in-person office evaluation/go to MAU at [facility] for any urgent or concerning symptoms. Please refer to After Visit Summary for other counseling recommendations.   I provided 11 minutes of non-face-to-face time during this encounter.  Return in about 1 week (around 11/13/2019) for in person.  Future Appointments  Date Time Provider Department Center  11/06/2019 11:15 AM Adam Phenix, MD [facility]    Scheryl Darter, MD Center for [facility]

## 2019-11-06 NOTE — H&P (Addendum)
OBSTETRIC ADMISSION HISTORY AND PHYSICAL  Margaret Cain is a 34 y.o. female [phone] with IUP at 37w1dby 8 wk [place]resenting for SROM. She reports +FMs, No LOF, no VB, no blurry vision, headaches or peripheral edema, and RUQ pain.  She plans on breast feeding. She requests BTL for birth control. [facility]  Dating: By 8wk [place]--->  Estimated Date of Delivery: 11/26/19  Sono:   _0 , CWD, normal anatomy, presentation cephalic, anterior placental lie, 421g, EFW 97%  Prenatal History/Complications: ASCUS with High Risk HPV Anemia Migraine Trichomonas neg TOC 04/22/19 Grand multiparity H/O GDM and gHTN, not during this pregnancy  Past Medical History: Past Medical History:  Diagnosis Date  . Anemia   . Anxiety   . Depression    ppd with first child  . Gestational diabetes    diet controlled  . Headache(784.0)   . Hx of chlamydia infection   . Positive GBS test 03/18/2017  . Pregnancy induced hypertension   . Preterm labor   . Trichomonosis   . Urinary tract infection   . Vaginal Pap smear, abnormal    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    Past Surgical History: Past Surgical History:  Procedure Laterality Date  . COLPOSCOPY      Obstetrical History: OB History    Gravida  9   Para  7   Term  6   Preterm  1   AB  1   Living  7     SAB  1   TAB  0   Ectopic  0   Multiple  0   Live Births  7           Social History: Social History   Socioeconomic History  . Marital status: Single    Spouse name: Not on file  . Number of children: Not on file  . Years of education: Not on file  . Highest education level: Not on file  Occupational History  . Not on file  Tobacco Use  . Smoking status: Never Smoker  . Smokeless tobacco: Never Used  Substance and Sexual Activity  . Alcohol use: No  . Drug use: No  . Sexual activity: Yes    Birth control/protection: None    Comment: 1 partner  Other Topics Concern  . Not on file   Social History Narrative   ** Merged History Encounter **       Social Determinants of Health   Financial Resource Strain:   . Difficulty of Paying Living Expenses: Not on file  Food Insecurity:   . Worried About RCharity fundraiserin the Last Year: Not on file  . Ran Out of Food in the Last Year: Not on file  Transportation Needs:   . Lack of Transportation (Medical): Not on file  . Lack of Transportation (Non-Medical): Not on file  Physical Activity:   . Days of Exercise per Week: Not on file  . Minutes of Exercise per Session: Not on file  Stress:   . Feeling of Stress : Not on file  Social Connections:   . Frequency of Communication with Friends and Family: Not on file  . Frequency of Social Gatherings with Friends and Family: Not on file  . Attends Religious Services: Not on file  . Active Member of Clubs or Organizations: Not on file  . Attends CArchivistMeetings: Not on file  . Marital Status: Not on file  Family History: Family History  Problem Relation Age of Onset  . Asthma Son   . Asthma Paternal Aunt   . Anesthesia problems Neg Hx   . Hypotension Neg Hx   . Malignant hyperthermia Neg Hx   . Pseudochol deficiency Neg Hx   . Other Neg Hx   . Cancer Neg Hx   . Heart disease Neg Hx   . Stroke Neg Hx   . Hearing loss Neg Hx     Allergies: Allergies  Allergen Reactions  . Penicillins Nausea And Vomiting    Has patient had a PCN reaction causing immediate rash, facial/tongue/throat swelling, SOB or lightheadedness with hypotension: No Has patient had a PCN reaction causing severe rash involving mucus membranes or skin necrosis: No Has patient had a PCN reaction that required hospitalization: No Has patient had a PCN reaction occurring within the last 10 years: No If all of the above answers are "NO", then may proceed with Cephalosporin use.     Medications Prior to Admission  Medication Sig Dispense Refill Last Dose  . Blood Pressure  Monitoring (BLOOD PRESSURE KIT) DEVI 1 Device by Does not apply route daily. ICD 10: Z34.00 1 each 0 11/06/2019 at Unknown time  . Prenatal Vit-Fe Phos-FA-Omega (VITAFOL GUMMIES) 3.33-0.333-34.8 MG CHEW CHEW 3 GUMMIES BY MOUTH DAILY   11/06/2019 at Unknown time     Review of Systems   All systems reviewed and negative except as stated in HPI  Blood pressure 109/83, pulse (!) 109, temperature 98.1 F (36.7 C), temperature source Oral, resp. rate 18, last menstrual period 02/13/2019, SpO2 99 %, not currently breastfeeding. General appearance: alert, cooperative and appears stated age Lungs: normal effort Heart: regular rate  Abdomen: soft, non-tender; bowel sounds normal Pelvic: gravid uterus GU: No vaginal lesions  Extremities: Homans sign is negative, no sign of DVT DTR's intact Presentation: cephalic Fetal monitoringBaseline: 120 bpm, Variability: Good {> 6 bpm) and Accelerations: Reactive Uterine activity: None and Frequency: irreg q5 minutes   Prenatal labs: ABO, Rh: --/--/PENDING (01/21 2010) Antibody: PENDING (01/21 2010) Rubella: 3.10 (08/06 1437) RPR: Non Reactive (11/19 0950)  HBsAg: Negative (08/06 1437)  HIV: Non Reactive (11/19 0950)  GBS: Negative/-- (01/14 1059)  2 hr Glucola 91 Genetic screening  not done Anatomy US- Normal  Prenatal Transfer Tool  Maternal Diabetes: No Genetic Screening: Declined Maternal Ultrasounds/Referrals: Normal Fetal Ultrasounds or other Referrals:  Referred to Materal Fetal Medicine , normal growth Maternal Substance Abuse:  No Significant Maternal Medications:  None Significant Maternal Lab Results: Group B Strep negative  Results for orders placed or performed during the hospital encounter of 11/06/19 (from the past 24 hour(s))  POCT fern test   Collection Time: 11/06/19  8:01 PM  Result Value Ref Range   POCT Fern Test Positive = ruptured amniotic membanes   CBC   Collection Time: 11/06/19  8:10 PM  Result Value Ref Range    WBC 5.0 4.0 - 10.5 K/uL   RBC 3.40 (L) 3.87 - 5.11 MIL/uL   Hemoglobin 9.3 (L) 12.0 - 15.0 g/dL   HCT 28.8 (L) 36.0 - 46.0 %   MCV 84.7 80.0 - 100.0 fL   MCH 27.4 26.0 - 34.0 pg   MCHC 32.3 30.0 - 36.0 g/dL   RDW 12.7 11.5 - 15.5 %   Platelets 178 150 - 400 K/uL   nRBC 0.0 0.0 - 0.2 %  Type and screen [place]   Collection Time: 11/06/19  8:10 PM  Result Value Ref Range   ABO/RH(D) PENDING    Antibody Screen PENDING    Sample Expiration      11/09/2019,2359 Performed at [facility], [place] [address], [place], [place] [address]     Patient Active Problem List   Diagnosis Date Noted  . Amniotic fluid leaking 11/06/2019  . [place] multipara 09/04/2019  . Supervision of low-risk pregnancy 05/22/2019  . Trichomonas infection 04/22/2019  . Migraine headache 06/08/2017  . Anemia affecting pregnancy, antepartum 02/17/2016  . ASCUS with positive high risk HPV cervical 10/21/2015    Assessment/Plan:  EMMAGENE ORTNER is a 34 y.o. [id number] at 24w1dhere for SOL.  #Labor: Patient is in active labor and has made good cervical change. Expectant management for now. #COVID-19: Patient is positive, asymptomatic. Airborne and contact precautions in place, being moved to a negative pressure room. Admit labs pending. #PPH risk- TXA ordered prior to delivery, will use methergine if indicated #Pain: Per patient request #FWB: Cat 1; EFW: 7#0 #ID:  GBS negative #MOF: breast #MOC: BTL, consent signed 09/04/19  Anticipate vaginal delivery.  CGladys Damme MD [place]1/21/2021, 8:37 PM  GME ATTESTATION:  I saw and evaluated the patient. I agree with the findings and the plan of care as documented in the resident's note.  HMerilyn Baba DO OB Fellow, [facility] Centerfor [facility]1/21/2021 10:14 PM

## 2019-11-06 NOTE — MAU Note (Signed)
Covid swab obtained without difficulty and pt tol welll. No symptoms

## 2019-11-06 NOTE — MAU Note (Signed)
Patient to go to Labor and delivery room #207. Report given to charge nurse.

## 2019-11-06 NOTE — MAU Provider Note (Signed)
First Provider Initiated Contact with Patient 11/06/19 1944       S: [name] is a 34 y.o. [id number] at [redacted]w[redacted]d  who presents to MAU today complaining of leaking of fluid since this evening. She denies vaginal bleeding. She denies contractions. She reports normal fetal movement.    O: BP 109/83 (BP Location: Left Arm)   Pulse (!) 109   Temp 98.1 F (36.7 C) (Oral)   Resp 18   LMP 02/13/2019 (Exact Date)   SpO2 99%  GENERAL: Well-developed, well-nourished female in no acute distress.  HEAD: Normocephalic, atraumatic.  CHEST: Normal effort of breathing, regular heart rate ABDOMEN: Soft, nontender, gravid PELVIC: Normal external female genitalia. Vagina is pink and rugated. Cervix with normal contour, no lesions. Normal discharge.  Positive pooling.   Cervical exam:   deferred   Fetal Monitoring: Baseline: 140 Variability: moderate Accelerations: 15x15 Decelerations: none Contractions: irregular  Results for orders placed or performed during the hospital encounter of 11/06/19 (from the past 24 hour(s))  POCT fern test     Status: Abnormal   Collection Time: 11/06/19  8:01 PM  Result Value Ref Range   POCT Fern Test Positive = ruptured amniotic membanes     Pt informed that the ultrasound is considered a limited OB ultrasound and is not intended to be a complete ultrasound exam.  Patient also informed that the ultrasound is not being completed with the intent of assessing for fetal or placental anomalies or any pelvic abnormalities.  Explained that the purpose of today's ultrasound is to assess for  presentation.  Patient acknowledges the purpose of the exam and the limitations of the study.   Cephalic   A: SIUP at [redacted]w[redacted]d  SROM  P: Admit to birthing suites  GBS negative Presentation confirmed by BSUS Care turned over to labor team  Judeth Horn, NP 11/06/2019 8:01 PM

## 2019-11-06 NOTE — Patient Instructions (Signed)

## 2019-11-06 NOTE — MAU Note (Addendum)
..  Margaret Cain is a 34 y.o. at [redacted]w[redacted]d here in MAU reporting: from EMS she was laying on the couch and felt a gush of fluid. +FM. No vaginal bleeding. Some vaginal pressure. No CTX.  Vitals:   11/06/19 1922  BP: 109/83  Pulse: (!) 109  Resp: 18  Temp: 98.1 F (36.7 C)  SpO2: 99%

## 2019-11-07 ENCOUNTER — Encounter: Payer: Self-pay | Admitting: Medical

## 2019-11-07 ENCOUNTER — Inpatient Hospital Stay (HOSPITAL_COMMUNITY): Payer: Medicaid Other | Admitting: Anesthesiology

## 2019-11-07 ENCOUNTER — Encounter (HOSPITAL_COMMUNITY): Payer: Self-pay | Admitting: Obstetrics & Gynecology

## 2019-11-07 ENCOUNTER — Encounter (HOSPITAL_COMMUNITY)
Admission: AD | Disposition: A | Discharge status: Home / Self Care | Payer: Self-pay | Source: Home / Self Care | Attending: Obstetrics & Gynecology

## 2019-11-07 DIAGNOSIS — U071 COVID-19: Secondary | ICD-10-CM | POA: Insufficient documentation

## 2019-11-07 HISTORY — PX: TUBAL LIGATION: SHX77

## 2019-11-07 LAB — RPR: RPR Ser Ql: NONREACTIVE

## 2019-11-07 SURGERY — LIGATION, FALLOPIAN TUBE, POSTPARTUM
Anesthesia: Spinal

## 2019-11-07 MED ORDER — KETOROLAC TROMETHAMINE 30 MG/ML IJ SOLN: 30.0000 mg | Freq: Once | INTRAMUSCULAR | Status: DC | PRN

## 2019-11-07 MED ORDER — COCONUT OIL OIL: 1.0000 "application " | TOPICAL_OIL | Status: DC | PRN

## 2019-11-07 MED ORDER — BUPIVACAINE HCL (PF) 0.25 % IJ SOLN
INTRAMUSCULAR | Status: AC
  Filled 2019-11-07: qty 30

## 2019-11-07 MED ORDER — FAMOTIDINE 20 MG PO TABS
40.0000 mg | ORAL_TABLET | Freq: Once | ORAL | Status: AC
  Administered 2019-11-07: 15:00:00 40 mg via ORAL
  Filled 2019-11-07: qty 2

## 2019-11-07 MED ORDER — HYDROMORPHONE HCL 1 MG/ML IJ SOLN: 0.2500 mg | INTRAMUSCULAR | Status: DC | PRN

## 2019-11-07 MED ORDER — SENNOSIDES-DOCUSATE SODIUM 8.6-50 MG PO TABS
2.0000 | ORAL_TABLET | ORAL | Status: DC
  Administered 2019-11-08 – 2019-11-09 (×2): 2 via ORAL
  Filled 2019-11-07 (×2): qty 2

## 2019-11-07 MED ORDER — OXYCODONE-ACETAMINOPHEN 5-325 MG PO TABS
1.0000 | ORAL_TABLET | Freq: Four times a day (QID) | ORAL | Status: DC | PRN
  Administered 2019-11-08: 1 via ORAL
  Administered 2019-11-08: 06:00:00 2 via ORAL
  Administered 2019-11-09 (×2): 1 via ORAL
  Filled 2019-11-07: qty 1
  Filled 2019-11-07: qty 2
  Filled 2019-11-07 (×2): qty 1

## 2019-11-07 MED ORDER — SIMETHICONE 80 MG PO CHEW: 80.0000 mg | CHEWABLE_TABLET | ORAL | Status: DC | PRN

## 2019-11-07 MED ORDER — DIPHENHYDRAMINE HCL 25 MG PO CAPS: 25.0000 mg | ORAL_CAPSULE | Freq: Four times a day (QID) | ORAL | Status: DC | PRN

## 2019-11-07 MED ORDER — TETANUS-DIPHTH-ACELL PERTUSSIS 5-2.5-18.5 LF-MCG/0.5 IM SUSP: 0.5000 mL | Freq: Once | INTRAMUSCULAR | Status: DC

## 2019-11-07 MED ORDER — WITCH HAZEL-GLYCERIN EX PADS: 1.0000 "application " | MEDICATED_PAD | CUTANEOUS | Status: DC | PRN

## 2019-11-07 MED ORDER — BENZOCAINE-MENTHOL 20-0.5 % EX AERO: 1.0000 "application " | INHALATION_SPRAY | CUTANEOUS | Status: DC | PRN

## 2019-11-07 MED ORDER — LACTATED RINGERS IV SOLN
INTRAVENOUS | Status: DC
  Administered 2019-11-07: 15:00:00 20 mL/h via INTRAVENOUS

## 2019-11-07 MED ORDER — ACETAMINOPHEN 325 MG PO TABS
650.0000 mg | ORAL_TABLET | ORAL | Status: DC | PRN
  Administered 2019-11-07: 650 mg via ORAL
  Filled 2019-11-07: qty 2

## 2019-11-07 MED ORDER — OXYCODONE HCL 5 MG PO TABS: 5.0000 mg | ORAL_TABLET | Freq: Once | ORAL | Status: DC | PRN

## 2019-11-07 MED ORDER — OXYTOCIN 40 UNITS IN NORMAL SALINE INFUSION - SIMPLE MED
1.0000 m[IU]/min | INTRAVENOUS | Status: DC
  Administered 2019-11-07: 2 m[IU]/min via INTRAVENOUS

## 2019-11-07 MED ORDER — BUPIVACAINE IN DEXTROSE 0.75-8.25 % IT SOLN
INTRATHECAL | Status: DC | PRN
  Administered 2019-11-07: 9.75 mg via INTRATHECAL

## 2019-11-07 MED ORDER — ZOLPIDEM TARTRATE 5 MG PO TABS: 5.0000 mg | ORAL_TABLET | Freq: Every evening | ORAL | Status: DC | PRN

## 2019-11-07 MED ORDER — PROMETHAZINE HCL 25 MG/ML IJ SOLN
12.5000 mg | INTRAMUSCULAR | Status: DC | PRN
  Administered 2019-11-07: 12.5 mg via INTRAVENOUS
  Filled 2019-11-07: qty 1

## 2019-11-07 MED ORDER — BUTORPHANOL TARTRATE 1 MG/ML IJ SOLN
1.0000 mg | INTRAMUSCULAR | Status: DC | PRN
  Administered 2019-11-07: 02:00:00 1 mg via INTRAVENOUS
  Filled 2019-11-07: qty 1

## 2019-11-07 MED ORDER — HYDROMORPHONE HCL 1 MG/ML IJ SOLN: 0.5000 mg | Freq: Once | INTRAMUSCULAR | Status: DC

## 2019-11-07 MED ORDER — OXYCODONE HCL 5 MG/5ML PO SOLN: 5.0000 mg | Freq: Once | ORAL | Status: DC | PRN

## 2019-11-07 MED ORDER — OXYCODONE HCL 5 MG PO TABS
5.0000 mg | ORAL_TABLET | ORAL | Status: DC | PRN
  Administered 2019-11-07 (×2): 5 mg via ORAL
  Filled 2019-11-07 (×2): qty 1

## 2019-11-07 MED ORDER — OXYCODONE HCL 5 MG PO TABS
10.0000 mg | ORAL_TABLET | ORAL | Status: DC | PRN
  Administered 2019-11-07: 10 mg via ORAL
  Filled 2019-11-07: qty 2

## 2019-11-07 MED ORDER — DIBUCAINE (PERIANAL) 1 % EX OINT: 1.0000 "application " | TOPICAL_OINTMENT | CUTANEOUS | Status: DC | PRN

## 2019-11-07 MED ORDER — ONDANSETRON HCL 4 MG/2ML IJ SOLN: 4.0000 mg | INTRAMUSCULAR | Status: DC | PRN

## 2019-11-07 MED ORDER — IBUPROFEN 600 MG PO TABS
600.0000 mg | ORAL_TABLET | Freq: Four times a day (QID) | ORAL | Status: DC
  Administered 2019-11-07 – 2019-11-09 (×8): 600 mg via ORAL
  Filled 2019-11-07 (×9): qty 1

## 2019-11-07 MED ORDER — TERBUTALINE SULFATE 1 MG/ML IJ SOLN: 0.2500 mg | Freq: Once | INTRAMUSCULAR | Status: DC | PRN

## 2019-11-07 MED ORDER — ONDANSETRON HCL 4 MG/2ML IJ SOLN: 4.0000 mg | Freq: Once | INTRAMUSCULAR | Status: DC | PRN

## 2019-11-07 MED ORDER — PRENATAL MULTIVITAMIN CH
1.0000 | ORAL_TABLET | Freq: Every day | ORAL | Status: DC
  Administered 2019-11-08 – 2019-11-09 (×2): 1 via ORAL
  Filled 2019-11-07 (×3): qty 1

## 2019-11-07 MED ORDER — ONDANSETRON HCL 4 MG PO TABS
4.0000 mg | ORAL_TABLET | ORAL | Status: DC | PRN
  Administered 2019-11-08 – 2019-11-09 (×2): 4 mg via ORAL
  Filled 2019-11-07 (×2): qty 1

## 2019-11-07 MED ORDER — METOCLOPRAMIDE HCL 10 MG PO TABS
10.0000 mg | ORAL_TABLET | Freq: Once | ORAL | Status: AC
  Administered 2019-11-07: 15:00:00 10 mg via ORAL
  Filled 2019-11-07: qty 1

## 2019-11-07 MED ORDER — KETOROLAC TROMETHAMINE 30 MG/ML IJ SOLN
INTRAMUSCULAR | Status: AC
  Filled 2019-11-07: qty 1

## 2019-11-07 SURGICAL SUPPLY — 19 items
CLOTH BEACON ORANGE TIMEOUT ST (SAFETY) ×3 IMPLANT
DRSG OPSITE POSTOP 3X4 (GAUZE/BANDAGES/DRESSINGS) ×3 IMPLANT
DURAPREP 26ML APPLICATOR (WOUND CARE) ×3 IMPLANT
GLOVE BIOGEL PI IND STRL 7.0 (GLOVE) ×2 IMPLANT
GLOVE BIOGEL PI INDICATOR 7.0 (GLOVE) ×4
GLOVE ECLIPSE 7.0 STRL STRAW (GLOVE) ×6 IMPLANT
GOWN STRL REUS W/TWL LRG LVL3 (GOWN DISPOSABLE) ×6 IMPLANT
NEEDLE HYPO 22GX1.5 SAFETY (NEEDLE) ×3 IMPLANT
NS IRRIG 1000ML POUR BTL (IV SOLUTION) ×3 IMPLANT
PACK ABDOMINAL MINOR (CUSTOM PROCEDURE TRAY) ×3 IMPLANT
PROTECTOR NERVE ULNAR (MISCELLANEOUS) ×3 IMPLANT
SPONGE LAP 4X18 RFD (DISPOSABLE) IMPLANT
SUT VIC AB 0 CT1 27 (SUTURE) ×2
SUT VIC AB 0 CT1 27XBRD ANBCTR (SUTURE) ×1 IMPLANT
SUT VICRYL 4-0 PS2 18IN ABS (SUTURE) ×3 IMPLANT
SYR CONTROL 10ML LL (SYRINGE) ×3 IMPLANT
TOWEL OR 17X24 6PK STRL BLUE (TOWEL DISPOSABLE) ×6 IMPLANT
TRAY FOLEY CATH SILVER 14FR (SET/KITS/TRAYS/PACK) ×3 IMPLANT
WATER STERILE IRR 1000ML POUR (IV SOLUTION) ×3 IMPLANT

## 2019-11-07 NOTE — Anesthesia Preprocedure Evaluation (Addendum)
Anesthesia Evaluation  Patient identified by MRN, date of birth, ID band Patient awake    Reviewed: Allergy & Precautions, NPO status , Patient's Chart, lab work & pertinent test results  Airway Mallampati: II  TM Distance: >3 FB Neck ROM: Full    Dental no notable dental hx. (+) Teeth Intact   Pulmonary neg pulmonary ROS,    Pulmonary exam normal breath sounds clear to auscultation       Cardiovascular hypertension, Pt. on medications Normal cardiovascular exam Rhythm:Regular Rate:Normal     Neuro/Psych  Headaches, negative psych ROS   GI/Hepatic Neg liver ROS,   Endo/Other  diabetes, Type 2  Renal/GU      Musculoskeletal   Abdominal   Peds  Hematology  (+) anemia , Hgb 9.7 Plt 178   Anesthesia Other Findings   Reproductive/Obstetrics                            Anesthesia Physical Anesthesia Plan  ASA: II  Anesthesia Plan: Spinal   Post-op Pain Management:    Induction:   PONV Risk Score and Plan: Treatment may vary due to age or medical condition  Airway Management Planned: Nasal Cannula and Natural Airway  Additional Equipment: None  Intra-op Plan:   Post-operative Plan:   Informed Consent: I have reviewed the patients History and Physical, chart, labs and discussed the procedure including the risks, benefits and alternatives for the proposed anesthesia with the patient or authorized representative who has indicated his/her understanding and acceptance.     Dental advisory given  Plan Discussed with:   Anesthesia Plan Comments:        Anesthesia Quick Evaluation

## 2019-11-07 NOTE — Progress Notes (Signed)
Labor Progress Note Margaret Cain is a 34 y.o. [phone] at [redacted]w[redacted]d presented for SOL S: Patient requesting IV pain meds, plans to labor without epidural  O:  BP 106/85   Pulse 85   Temp 98.9 F (37.2 C) (Oral)   Resp 18   Ht 5' (1.524 m)   Wt 51.7 kg   LMP 02/13/2019 (Exact Date)   SpO2 99%   BMI 22.26 kg/m  EFM: 120bpm/+accels/-decels  CVE: Dilation: 5 Effacement (%): 50 Station: 0 Presentation: Vertex Exam by:: dr Salomon Mast   A&P: 34 y.o. [id number] [redacted]w[redacted]d here for SOL. #COVID-19: positive, asymptomatic #Labor: Progressing well. AROM forebag for clear @ 0120. Plan to start pitocin now, discussed risks and benefits, patient willing to proceed.  #PPH risk- TXA hanging ready for delivery, will use methergine if indicated. #Pain: Stadol and phenergan #FWB: Cat 1 #GBS negative #MOC: BTL, consent signed 09/04/19  Shirlean Mylar, MD 1:38 AM

## 2019-11-07 NOTE — Progress Notes (Signed)
Patient desires permanent sterilization.  Other reversible forms of contraception were discussed with patient; she declines all other modalities. Risks of procedure discussed with patient including but not limited to: risk of regret, permanence of method, bleeding, infection, injury to surrounding organs and need for additional procedures.  Failure risk of about 1% with increased risk of ectopic gestation if pregnancy occurs was also discussed with patient.  Also discussed possibility of post-tubal pain syndrome. Patient verbalized understanding of these risks and wants to proceed with sterilization.  Written informed consent obtained.  To OR when ready.  Jerilynn Birkenhead, MD [facility] Family Medicine Fellow, [facility] for [employer], [facility]

## 2019-11-07 NOTE — Discharge Summary (Signed)
Postpartum Discharge Summary     Patient Name: Margaret Cain DOB: 1985-12-12 MRN: [id number]  Date of admission: 11/06/2019 Delivering Provider: Merilyn Baba   Date of discharge: 11/09/2019  Admitting diagnosis: Amniotic fluid leaking [O42.90] Intrauterine pregnancy: [redacted]w[redacted]d    Secondary diagnosis:  Active Problems:   Anemia affecting pregnancy, antepartum   Grand multipara   Amniotic fluid leaking   COVID-19   [redacted] weeks gestation of pregnancy  Additional problems: Grand multiparity, anemia     Discharge diagnosis: Term Pregnancy Delivered                                                                                                Post partum procedures:Nexplanon placed  Augmentation: AROM and Pitocin  Complications: None  Hospital course:  Onset of Labor With Vaginal Delivery     34y.o. yo [id number] 353w2das admitted in Active Labor on 11/06/2019. Patient had an uncomplicated labor course as follows:   Patient was admitted in active labor and SROM (fern positive in MAU). Labor stalled out, so she was augmented with pitocin. A bulging bag was found, so she was AROMed, and then progressed to delivery on all fours.  Membrane Rupture Time/Date: 6:41 PM ,11/06/2019   Intrapartum Procedures: Episiotomy: None [1]                                         Lacerations:  Periurethral [8]  Patient had a delivery of a Viable infant. 11/07/2019  Information for the patient's newborn:  KiAhmira, Boisselle0[[id number]]Delivery Method: Vag-Spont     Pateint had an uncomplicated postpartum course. Declined BTL. Nexplanon placed. SW saw patient and noted barriers to discharge; infant cannot DC with mom today. She is ambulating, tolerating a regular diet, passing flatus, and urinating well. Patient is discharged home in stable condition on 11/09/19.  Delivery time: 6:36 AM    Magnesium Sulfate received: No BMZ received: No Rhophylac:N/A MMR:N/A Transfusion:No  Physical  exam  Vitals:   11/07/19 2137 11/08/19 0610 11/08/19 1416 11/08/19 2104  BP: 105/69 (!) '95/59 99/63 96/67 '  Pulse: 60 60 70 71  Resp: '16 16 16 18  ' Temp: 98.3 F (36.8 C) 97.9 F (36.6 C) (!) 97.4 F (36.3 C) 97.6 F (36.4 C)  TempSrc: Oral Oral Axillary Oral  SpO2: 99% 100% 99%   Weight:      Height:       General: alert, cooperative and no distress Lochia: appropriate Uterine Fundus: firm Incision: N/A DVT Evaluation: No evidence of DVT seen on physical exam. Labs: Lab Results  Component Value Date   WBC 4.0 11/08/2019   HGB 8.1 (L) 11/08/2019   HCT 25.5 (L) 11/08/2019   MCV 85.6 11/08/2019   PLT 143 (L) 11/08/2019   CMP Latest Ref Rng & Units 07/25/2017  Glucose 65 - 99 mg/dL 100(H)  BUN 6 - 20 mg/dL 3(L)  Creatinine 0.44 - 1.00 mg/dL 0.40(L)  Sodium 135 - 145 mmol/L 139  Potassium 3.5 - 5.1 mmol/L 3.3(L)  Chloride 101 - 111 mmol/L 101  CO2 22 - 32 mmol/L -  Calcium 8.9 - 10.3 mg/dL -  Total Protein 6.5 - 8.1 g/dL -  Total Bilirubin 0.3 - 1.2 mg/dL -  Alkaline Phos 38 - 126 U/L -  AST 15 - 41 U/L -  ALT 14 - 54 U/L -    Discharge instruction: per After Visit Summary and "Baby and Me Booklet".  After visit meds:  Allergies as of 11/09/2019      Reactions   Cheese Flavor Hives   Only Parmesan   Penicillins Nausea And Vomiting   Has patient had a PCN reaction causing immediate rash, facial/tongue/throat swelling, SOB or lightheadedness with hypotension: No Has patient had a PCN reaction causing severe rash involving mucus membranes or skin necrosis: No Has patient had a PCN reaction that required hospitalization: No Has patient had a PCN reaction occurring within the last 10 years: No If all of the above answers are "NO", then may proceed with Cephalosporin use.      Medication List    TAKE these medications   acetaminophen 500 MG tablet Commonly known as: TYLENOL Take 1 tablet (500 mg total) by mouth every 6 (six) hours as needed.   Blood Pressure Kit  Devi 1 Device by Does not apply route daily. ICD 10: Z34.00   ferrous sulfate 325 (65 FE) MG tablet Take 1 tablet (325 mg total) by mouth daily with breakfast.   ibuprofen 600 MG tablet Commonly known as: ADVIL Take 1 tablet (600 mg total) by mouth every 6 (six) hours.   senna-docusate 8.6-50 MG tablet Commonly known as: Senokot-S Take 2 tablets by mouth daily. Start taking on: November 10, 2019   Vitafol Gummies 3.33-0.333-34.8 MG Chew Chew 3 tablets by mouth daily.       Diet: routine diet  Activity: Advance as tolerated. Pelvic rest for 6 weeks.   Outpatient follow up:4 weeks Follow up Appt: Future Appointments  Date Time Provider [place]  11/24/2019 10:00 AM Pender [place]  12/15/2019  3:35 PM Hillard Danker Myles Rosenthal, PA-C [facility]   Follow up Visit: Please schedule this patient for Postpartum visit in: 4 weeks with the following provider: Any provider For C/S patients schedule nurse incision check in weeks 2 weeks: no High risk pregnancy complicated by: Grand multip, Anemia, Asymptomatic COVID+ Delivery mode:  SVD Anticipated Birth Control: Nexplanon PP Procedures needed: Incision check  Schedule Integrated [place] visit: no  Newborn Data: Live born female  Birth Weight:  3135g APGAR: 67, 9  Newborn Delivery   Birth date/time: 11/07/2019 06:36:00 Delivery type: Vaginal, Spontaneous      Baby Feeding: Breast Disposition:rooming in   11/09/2019 Chauncey Mann, MD

## 2019-11-07 NOTE — Clinical Social Work Maternal (Signed)
CLINICAL SOCIAL WORK MATERNAL/CHILD NOTE  Patient Details  Name: Margaret Cain MRN: [id number] Date of Birth: 04/02/1986  Date:  11/07/2019  Clinical Social Worker Initiating Note:  Delainee Tramel, LCSW Date/Time: Initiated:  11/07/19/1431     Child's Name:  Margaret Cain   Biological Parents:  Mother, Father(Father: Rakim Cain)   Need for Interpreter:  None   Reason for Referral:  Behavioral Health Concerns, Current CPS Involvement   Address:  [address] [place] [address]    Phone number:  [phone] (home)     Additional phone number:   Household Members/Support Persons (HM/SP):   Household Member/Support Person 1, Household Member/Support Person 2, Household Member/Support Person 3, Household Member/Support Person 4, Household Member/Support Person 6, Household Member/Support Person 5   HM/SP Name Relationship DOB or Age  HM/SP -1 Lorenzo Grandstaff son 01/21/04  HM/SP -2 Khy'Meir Majors son 10/15/10  HM/SP -3 Kamari Majors son 07/17/12  HM/SP -4 Nylah Majors daughter 09/20/13  HM/SP -5 Ocean Majors son 09/08/14  HM/SP -6 Zii'drae Majors son 03/19/16  HM/SP -7        HM/SP -8          Natural Supports (not living in the home):  Immediate Family, Extended Family, Parent, Spouse/significant other   Professional Supports: Case Manager/Social Worker([place] [employer] CPS social worker (Rachael Cooley))   Employment: Unemployed   Type of Work:     Education:  9 to 11 years   Homebound arranged: No  Financial Resources:  Medicaid   Other Resources:  WIC, Food Stamps    Cultural/Religious Considerations Which May Impact Care:    Strengths:  Home prepared for child , Pediatrician chosen   Psychotropic Medications:         Pediatrician:    [place] (including )  Pediatrician List:   [place]    [place]    [place]    [place]    [place]    [place] Other([facility] [address] [place])    Pediatrician Fax Number:    Risk Factors/Current Problems:  DHHS Involvement    Cognitive State:  Able to Concentrate , Alert , Linear Thinking , Goal Oriented    Mood/Affect:  Calm , Interested    CSW Assessment: CSW contacted MOB via telephone to complete assessment. MOB verified identity. CSW introduced self and explained reason for consult. MOB sounded pleasant and remained engaged during assessment. MOB initially reported that she resides alone and a few moments later reported that her 7 kids resided with her. MOB reported that she is unemployed and receives WIC/Food stamps. MOB reported that infant will be staying with paternal grandmother (Margaret Cain [phone]) due to her COVID positive status. MOB put paternal grandmother on the phone who reported that she has all items needed to care for infant including a basinet and car seat. CSW provided review of Sudden Infant Death Syndrome (SIDS) precautions with paternal grandmother. Paternal grandmother reported that infant will be going to [facility]. CSW thanked paternal grandmother for information provided and asked to speak back with MOB. CSW inquired about MOB's support system, MOB reported that FOB's mom, FOB's family, her mom, aunts, cousins, sister, nieces and nephews are her supports.   CSW inquired about MOB's mental health history, MOB denied any mental health history. CSW asked MOB if she had any history of anxiety, depression or postpartum depression, MOB reported no history of anxiety, depression or PPD. CSW inquired   about how MOB was feeling emotionally since giving birth, MOB reported that she was feeling "great" and "love". MOB sounded calm and did not demonstrate any acute mental health signs/symptoms. CSW assessed for safety, MOB denied SI, HI and domestic violence.  CSW provided education regarding the baby blues period vs. perinatal mood disorders, discussed treatment and gave  resources for mental health follow up if concerns arise.  CSW recommends self-evaluation during the postpartum time period using the New Mom Checklist from Postpartum Progress and encouraged MOB to contact a medical professional if symptoms are noted at any time.      CSW inquired about MOB's CPS history and if MOB had any current open cases. MOB reported that CPS threw her for a loop. MOB reported that she currently has an open case with [facility] CPS social worker Edwena Bunde) and that she is done with her case plan. CSW informed MOB that a CPS report would be made due to her having an open case, MOB verbalized understanding. CSW agreed to update MOB tomorrow after hearing back from CPS. MOB verbalized understanding.   CSW made a [facility] CPS report due to [facility] currently having an open case. Per CPS, MOB's children do not currently reside with her.   Currently there are barriers to infant discharging home with MOB. CSW awaiting a return call from CPS with infant's discharge plan.    CSW Plan/Description:  Sudden Infant Death Syndrome (SIDS) Education, Perinatal Mood and Anxiety Disorder (PMADs) Education, Child Protective Service Report , CSW Awaiting CPS Disposition Plan    Antionette Poles, LCSW 01/16/20, 2:39 PM

## 2019-11-07 NOTE — Anesthesia Procedure Notes (Signed)
Spinal  Patient location during procedure: OB Start time: 11/07/2019 4:31 PM End time: 11/07/2019 4:36 PM Staffing Performed: anesthesiologist  Anesthesiologist: Trevor Iha, MD Preanesthetic Checklist Completed: patient identified, IV checked, risks and benefits discussed, surgical consent, monitors and equipment checked, pre-op evaluation and timeout performed Spinal Block Patient position: sitting Prep: DuraPrep and site prepped and draped Patient monitoring: heart rate, cardiac monitor, continuous pulse ox and blood pressure Approach: midline Location: L3-4 Injection technique: single-shot Needle Needle type: Pencan  Needle gauge: 24 G Needle length: 10 cm Needle insertion depth: 5 cm Assessment Sensory level: T6 Additional Notes 1 Attempt (s). Pt tolerated procedure well.

## 2019-11-07 NOTE — Discharge Instructions (Signed)

## 2019-11-07 NOTE — Transfer of Care (Signed)
Immediate Anesthesia Transfer of Care Note  Patient: Margaret Cain  Procedure(s) Performed: POST PARTUM TUBAL LIGATION (N/A )  Patient Location: PACU  Anesthesia Type:Spinal  Level of Consciousness: awake  Airway & Oxygen Therapy: Patient Spontanous Breathing  Post-op Assessment: Report given to RN  Post vital signs: Reviewed and stable  Last Vitals:  Vitals Value Taken Time  BP    Temp    Pulse 59 11/07/19 1706  Resp    SpO2 100 % 11/07/19 1706  Vitals shown include unvalidated device data.  Last Pain:  Vitals:   11/07/19 1700  TempSrc: Oral  PainSc: 0-No pain         Complications: No apparent anesthesia complications

## 2019-11-08 DIAGNOSIS — Z30430 Encounter for insertion of intrauterine contraceptive device: Secondary | ICD-10-CM

## 2019-11-08 LAB — CBC
HCT: 25.5 % — ABNORMAL LOW (ref 36.0–46.0)
Hemoglobin: 8.1 g/dL — ABNORMAL LOW (ref 12.0–15.0)
MCH: 27.2 pg (ref 26.0–34.0)
MCHC: 31.8 g/dL (ref 30.0–36.0)
MCV: 85.6 fL (ref 80.0–100.0)
Platelets: 143 10*3/uL — ABNORMAL LOW (ref 150–400)
RBC: 2.98 MIL/uL — ABNORMAL LOW (ref 3.87–5.11)
RDW: 12.8 % (ref 11.5–15.5)
WBC: 4 10*3/uL (ref 4.0–10.5)
nRBC: 0 % (ref 0.0–0.2)

## 2019-11-08 MED ORDER — ETONOGESTREL 68 MG ~~LOC~~ IMPL
68.0000 mg | DRUG_IMPLANT | Freq: Once | SUBCUTANEOUS | Status: AC
  Administered 2019-11-08: 08:00:00 68 mg via SUBCUTANEOUS
  Filled 2019-11-08: qty 1

## 2019-11-08 MED ORDER — MEDROXYPROGESTERONE ACETATE 150 MG/ML IM SUSP: 150.0000 mg | Freq: Once | INTRAMUSCULAR | Status: DC

## 2019-11-08 MED ORDER — LIDOCAINE HCL 1 % IJ SOLN
0.0000 mL | Freq: Once | INTRAMUSCULAR | Status: AC | PRN
  Administered 2019-11-08: 09:00:00 20 mL via INTRADERMAL
  Filled 2019-11-08: qty 20

## 2019-11-08 MED ORDER — FERROUS SULFATE 325 (65 FE) MG PO TABS
325.0000 mg | ORAL_TABLET | Freq: Every day | ORAL | Status: DC
  Administered 2019-11-09: 325 mg via ORAL
  Filled 2019-11-08: qty 1

## 2019-11-08 NOTE — Progress Notes (Addendum)
CSW received return call from CPS social worker Leonette Most Key) who confirmed a plan to contact MOB via telephone to complete interview and agreed to fax over MOB's safety plan.   CSW updated MOB who is aware that CPS will be calling.   CSW will continue to follow until a discharge plan for infant is established.   Celso Sickle, LCSW Clinical Social Worker [facility] Cell#: [phone]

## 2019-11-08 NOTE — Anesthesia Postprocedure Evaluation (Signed)
Anesthesia Post Note  Patient: Margaret Cain  Procedure(s) Performed: POST PARTUM TUBAL LIGATION (N/A )     Patient location during evaluation: Mother Baby Anesthesia Type: Spinal Level of consciousness: oriented and awake and alert Pain management: pain level controlled Vital Signs Assessment: post-procedure vital signs reviewed and stable Respiratory status: spontaneous breathing and respiratory function stable Cardiovascular status: blood pressure returned to baseline and stable Postop Assessment: no headache, no backache, no apparent nausea or vomiting and able to ambulate Anesthetic complications: no Comments: Pt refused procedure after spinal placed recovered in OR    Last Vitals:  Vitals:   11/08/19 0610 11/08/19 1416  BP: (!) 95/59 99/63  Pulse: 60 70  Resp: 16 16  Temp: 36.6 C (!) 36.3 C  SpO2: 100% 99%    Last Pain:  Vitals:   11/08/19 1730  TempSrc:   PainSc: 6    Pain Goal:                   Trevor Iha

## 2019-11-08 NOTE — Procedures (Addendum)
   PROCEDURE NOTE  Margaret Cain is a 34 y.o. [id number] who is now PPD#1 and desires Nexplanon insertion.   Nexplanon Insertion Procedure Patient identified, informed consent performed, consent signed. Patient does understand that irregular bleeding is a very common side effect of this medication. She was advised to have backup contraception for one week after placement. Appropriate time out taken.  Patient's left arm was prepped and draped in the usual sterile fashion. The ruler used to measure and mark insertion area.  Patient was prepped with alcohol swab and then injected with 5 ml of 1% lidocaine.  She was prepped with betadine, Nexplanon removed from packaging,  Device confirmed in needle, then inserted full length of needle and withdrawn per handbook instructions. Nexplanon was able to palpated in the patient's arm; patient palpated the insert herself. There was minimal blood loss.  Patient insertion site covered with guaze and a pressure bandage to reduce any bruising. The patient tolerated the procedure well and was given post procedure instructions.   Lot# April 2023 Exp: [id number]  Jerilynn Birkenhead, MD [facility] Family Medicine Fellow, [facility] for [employer], [facility]

## 2019-11-08 NOTE — Progress Notes (Signed)
Post Partum Day 1 Subjective: Patient reports feeling well. She is tolerating PO. Ambulating and urinating without difficulty. Lochia minimal. Some cramping. She would like to discharge tomorrow. She has decided on the Nexplanon.   Objective: Blood pressure (!) 95/59, pulse 60, temperature 97.9 F (36.6 C), temperature source Oral, resp. rate 16, height 5' (1.524 m), weight 51.7 kg, last menstrual period 02/13/2019, SpO2 100 %, unknown if currently breastfeeding.  Physical Exam:  General: alert, cooperative and appears stated age 34: appropriate Uterine Fundus: firm DVT Evaluation: No evidence of DVT seen on physical exam.  Recent Labs    11/06/19 2010 11/08/19 0542  HGB 9.3* 8.1*  HCT 28.8* 25.5*    Assessment/Plan: Plan for discharge tomorrow  Bottle feeding PO iron initiated Decided on Nexplanon; will place today SW talked to patient via phone yesterday; there are barriers to discharge and will await their recommendations Vitals stable    LOS: 2 days   Joselyn Arrow 11/08/2019, 8:08 AM

## 2019-11-08 NOTE — Progress Notes (Signed)
Spoke with patient regarding her concerns with her care during this hospital stay. Answered questions and provided service recovery and validated patient concerns.

## 2019-11-08 NOTE — Progress Notes (Signed)
CSW contacted [place] [employer] CPS after hours social worker (Charles Key [phone]) to follow up on the CPS report made yesterday, no answer. CSW left voicemail requesting return phone call. Per infant's NP note, CPS called yesterday and verbalized a plan to interview MOB via facetime today and to have CFT meeting on Monday.   CSW awaiting a return call from CPS.   Margaret Trentman, LCSW Clinical Social Worker [facility] Cell#: [phone]  

## 2019-11-08 NOTE — Progress Notes (Signed)
[place] [employer] CPS social worker (Charles Key) provided CSW with a copy of safety plan to provide to MOB.   CSW provided safety plan to unit secretary to give to MOB's RN to give to MOB due to COVID positive status. CSW contacted MOB and provided update, MOB granted CSW verbal permission to place a copy of safety plan in infant's chart.   CSW placed a copy of safety plan in infant's chart.   Currently there are barriers to infant discharging home to MOB. CFT scheduled for Monday, time not decided at this time.   CSW will attend CFT meeting on Monday to obtain infant's discharge plan. Infant cannot discharge home to MOB at this time.   Lacosta Hargan, LCSW Clinical Social Worker [facility] Cell#: [phone]   

## 2019-11-09 ENCOUNTER — Encounter: Payer: Self-pay | Admitting: *Deleted

## 2019-11-09 DIAGNOSIS — Z3A37 37 weeks gestation of pregnancy: Secondary | ICD-10-CM

## 2019-11-09 MED ORDER — FERROUS SULFATE 325 (65 FE) MG PO TABS: 325.0000 mg | ORAL_TABLET | Freq: Every day | ORAL | 0 refills | Status: AC

## 2019-11-09 MED ORDER — SENNOSIDES-DOCUSATE SODIUM 8.6-50 MG PO TABS: 2.0000 | ORAL_TABLET | ORAL | 0 refills | Status: DC

## 2019-11-09 MED ORDER — OXYCODONE-ACETAMINOPHEN 5-325 MG PO TABS: 1.0000 | ORAL_TABLET | Freq: Four times a day (QID) | ORAL | 0 refills | Status: DC | PRN

## 2019-11-09 MED ORDER — ACETAMINOPHEN 500 MG PO TABS: 500.0000 mg | ORAL_TABLET | Freq: Four times a day (QID) | ORAL | 0 refills | Status: AC | PRN

## 2019-11-09 MED ORDER — IBUPROFEN 600 MG PO TABS: 600.0000 mg | ORAL_TABLET | Freq: Four times a day (QID) | ORAL | 0 refills | Status: DC

## 2019-11-09 NOTE — Plan of Care (Signed)
  Problem: Education: Goal: Knowledge of General Education information will improve Description: Including pain rating scale, medication(s)/side effects and non-pharmacologic comfort measures Outcome: Completed/Met   Problem: Clinical Measurements: Goal: Ability to maintain clinical measurements within normal limits will improve Outcome: Completed/Met Goal: Will remain free from infection Outcome: Completed/Met Goal: Diagnostic test results will improve Outcome: Completed/Met   Problem: Activity: Goal: Risk for activity intolerance will decrease Outcome: Completed/Met   Problem: Pain Managment: Goal: General experience of comfort will improve Outcome: Completed/Met   Problem: Safety: Goal: Ability to remain free from injury will improve Outcome: Completed/Met   Problem: Skin Integrity: Goal: Risk for impaired skin integrity will decrease Outcome: Completed/Met   Problem: Education: Goal: Knowledge of condition will improve Outcome: Completed/Met   Problem: Activity: Goal: Will verbalize the importance of balancing activity with adequate rest periods Outcome: Completed/Met Goal: Ability to tolerate increased activity will improve Outcome: Completed/Met   Problem: Life Cycle: Goal: Chance of risk for complications during the postpartum period will decrease Outcome: Completed/Met   Problem: Skin Integrity: Goal: Demonstration of wound healing without infection will improve Outcome: Completed/Met

## 2019-11-09 NOTE — Progress Notes (Signed)
Introduced self to patient and explained she has a discharge order and we were going to take her out of the system. Explained that she will no longer be a patient and will not be able to receive medications from [place]. Patient verbalized understanding.

## 2019-11-12 ENCOUNTER — Encounter: Payer: Self-pay | Admitting: *Deleted

## 2019-11-12 ENCOUNTER — Telehealth: Payer: Self-pay | Admitting: Family Medicine

## 2019-11-12 NOTE — Telephone Encounter (Signed)
Margaret Cain is requesting a call back after 1:00 pm. She had someone pick up her Rx, and they lost it. She is requesting a call back to speak to someone about getting something else for pain.

## 2019-11-12 NOTE — Telephone Encounter (Signed)
I called pt and discussed her request. She stated that someone picked up the Oxycodone for her and then lost it. Her pain is in her rib cage and @ the top of her uterus. I advised that we cannot prescribe additional narcotic pain medication. She may take ibuprofen as prescribed. Pt stated that when she went to pick up the ibuprofen she was told by the pharmacy that they did not have the prescription. I advised that I will check with [facility] and re-order the medication if needed. Pt voiced understanding. Following the phone call, I called [facility] and was told that all of the medications which were prescribed on 1/24 (except Oxycodone) are ready to be picked up. MyChart message sent to pt that her medications are ready @ [facility].

## 2019-11-13 ENCOUNTER — Encounter: Payer: Medicaid Other | Admitting: Obstetrics and Gynecology

## 2019-11-20 ENCOUNTER — Encounter: Payer: Medicaid Other | Admitting: Obstetrics and Gynecology

## 2019-11-24 ENCOUNTER — Ambulatory Visit: Payer: Medicaid Other

## 2019-12-15 ENCOUNTER — Other Ambulatory Visit: Payer: Self-pay

## 2019-12-15 ENCOUNTER — Encounter: Payer: Self-pay | Admitting: Medical

## 2019-12-15 ENCOUNTER — Telehealth (INDEPENDENT_AMBULATORY_CARE_PROVIDER_SITE_OTHER): Payer: Medicaid Other | Admitting: Medical

## 2019-12-15 DIAGNOSIS — Z1389 Encounter for screening for other disorder: Secondary | ICD-10-CM | POA: Diagnosis not present

## 2019-12-15 DIAGNOSIS — R600 Localized edema: Secondary | ICD-10-CM

## 2019-12-15 DIAGNOSIS — D509 Iron deficiency anemia, unspecified: Secondary | ICD-10-CM

## 2019-12-15 DIAGNOSIS — D649 Anemia, unspecified: Secondary | ICD-10-CM | POA: Insufficient documentation

## 2019-12-15 NOTE — Patient Instructions (Signed)

## 2019-12-15 NOTE — Progress Notes (Signed)
I connected with  Margaret Cain on 12/15/19 at  3:35 PM EST by telephone and verified that I am speaking with the correct person using two identifiers.   I discussed the limitations, risks, security and privacy concerns of performing an evaluation and management service by telephone and the availability of in person appointments. I also discussed with the patient that there may be a patient responsible charge related to this service. The patient expressed understanding and agreed to proceed.  Henrietta Dine, CMA 12/15/2019  3:43 PM

## 2019-12-15 NOTE — Progress Notes (Signed)
I connected with Margaret Cain on 12/15/19 at  3:35 PM EST by: [employer], unable to connect via MyChart and verified that I am speaking with the correct person using two identifiers.  Patient is located at home and provider is located at [facility].     The purpose of this telephone visit is to provide medical care while limiting exposure to the novel coronavirus. I discussed the limitations, risks, security and privacy concerns of performing an evaluation and management service by [employer] and the availability of in person appointments. I also discussed with the patient that there may be a patient responsible charge related to this service. By engaging in this virtual visit, you consent to the provision of healthcare.  Additionally, you authorize for your insurance to be billed for the services provided during this visit.  The patient expressed understanding and agreed to proceed.  The following staff members participated in the virtual visit:  Corinda Gubler, CMA  Post Partum Visit Note Subjective:    Margaret Cain is a 34 y.o. [id number] female who presents for a postpartum visit. She is 5 weeks postpartum following a spontaneous vaginal delivery. I have fully reviewed the prenatal and intrapartum course. The delivery was at 37/2 gestational weeks. Outcome: spontaneous vaginal delivery. Anesthesia: none. Postpartum course has been normal. Baby's course has been normal. Baby is feeding by bottle - [employer] Good Start. Bleeding thick, heavy lochia. Bowel function is normal. Bladder function is normal. Patient is not sexually active. Contraception method is Nexplanon. Postpartum depression screening: negative.  The patient states that she continues to have bleeding today with some small clots at time. The bleeding has slowed since delivery, but is persistent and irregular. Patient has a history of anemia and is not currently taking an iron supplement. She also states intermittent pubic symphysis pain with  certain activities. This does not occur everyday, but is frequent.   The patient also states that she continues to have intermittent swelling in her legs. Sometimes one is more than other, but it varies. She has also noted small areas of bruising on her shins.   The following portions of the patient's history were reviewed and updated as appropriate: allergies, current medications, past family history, past medical history, past social history, past surgical history and problem list.  Review of Systems Pertinent items are noted in HPI.   Objective:  There were no vitals filed for this visit. Patient unable to obtain BP today. She does have access to BP cuff and will check later today and report any values > 140/90  Assessment:    Normal postpartum exam. Pap smear not done at today's visit. Last pap smear 08/2018 and results were normal. Next pap due 08/2021.   Plan:   1. Contraception: Nexplanon 2. Still considering BTL. Will call if desired schedule 3. CBC ordered for next week.   4. Bilateral LE venous dopplers ordered for tomorrow to rule out DVT, although low suspicion given intermittent symptoms  5. If pelvic pain persists, will consider pelvic floor PT referral. Will check in with patient after CBC results to determine if symptoms are persistent or improving.  6. Follow up in: 1 week for labs and then as needed.   15 minutes of non-face-to-face time spent with the patient   Kathlene Cote 12/15/2019 4:39 PM

## 2019-12-16 ENCOUNTER — Telehealth: Payer: Self-pay

## 2019-12-16 NOTE — Progress Notes (Addendum)
Pt has Vascular appointment for 12/26/19 @ 9am.

## 2019-12-16 NOTE — Addendum Note (Signed)
Addended by: Henrietta Dine on: 12/16/2019 08:43 AM   Modules accepted: Orders

## 2019-12-16 NOTE — Telephone Encounter (Signed)
Called pt to advise her appointment with Heart & Vascular is scheduled on 12/26/19 @ 9am, gave pt address. Pt verbalized understanding.

## 2019-12-17 ENCOUNTER — Encounter (HOSPITAL_COMMUNITY): Payer: Medicaid Other

## 2019-12-22 ENCOUNTER — Other Ambulatory Visit: Payer: Medicaid Other

## 2019-12-22 ENCOUNTER — Other Ambulatory Visit: Payer: Self-pay

## 2019-12-22 DIAGNOSIS — D509 Iron deficiency anemia, unspecified: Secondary | ICD-10-CM

## 2019-12-23 LAB — CBC
Hematocrit: 36 % (ref 34.0–46.6)
Hemoglobin: 11.2 g/dL (ref 11.1–15.9)
MCH: 26 pg — ABNORMAL LOW (ref 26.6–33.0)
MCHC: 31.1 g/dL — ABNORMAL LOW (ref 31.5–35.7)
MCV: 84 fL (ref 79–97)
Platelets: 268 10*3/uL (ref 150–450)
RBC: 4.31 x10E6/uL (ref 3.77–5.28)
RDW: 15.8 % — ABNORMAL HIGH (ref 11.7–15.4)
WBC: 6.2 10*3/uL (ref 3.4–10.8)

## 2019-12-26 ENCOUNTER — Ambulatory Visit (HOSPITAL_COMMUNITY): Payer: Medicaid Other

## 2019-12-29 ENCOUNTER — Telehealth: Payer: Self-pay | Admitting: Medical

## 2019-12-29 NOTE — Telephone Encounter (Signed)
I called and spoke to Margaret Cain today. Her identity was confirmed using 2 identifiers. She was informed that CBC was normal and she can discontinue her iron supplement. She states that she had to cancel her LE venous doppler study because of transportation, but that her symptoms have resolved. She will let us know if pain or swelling returns. She also states pelvic pain is improving and declines pelvic floor PT referral at this time.   Marny Lowenstein, PA-C 12/29/2019 2:02 PM

## 2020-01-09 ENCOUNTER — Ambulatory Visit (HOSPITAL_COMMUNITY): Payer: Medicaid Other | Attending: Medical

## 2020-09-02 ENCOUNTER — Other Ambulatory Visit: Payer: Self-pay

## 2020-09-02 ENCOUNTER — Encounter (HOSPITAL_COMMUNITY): Payer: Self-pay | Admitting: Emergency Medicine

## 2020-09-02 ENCOUNTER — Ambulatory Visit (HOSPITAL_COMMUNITY)
Admission: EM | Admit: 2020-09-02 | Discharge: 2020-09-02 | Disposition: A | Discharge status: Home / Self Care | Payer: Medicaid Other | Attending: Family Medicine | Admitting: Family Medicine

## 2020-09-02 DIAGNOSIS — N898 Other specified noninflammatory disorders of vagina: Secondary | ICD-10-CM | POA: Insufficient documentation

## 2020-09-02 LAB — POCT URINALYSIS DIPSTICK, ED / UC
Glucose, UA: NEGATIVE mg/dL
Hgb urine dipstick: NEGATIVE
Ketones, ur: 40 mg/dL — AB
Leukocytes,Ua: NEGATIVE
Nitrite: NEGATIVE
Protein, ur: NEGATIVE mg/dL
Specific Gravity, Urine: >=1.03 (ref 1.005–1.030)
Urobilinogen, UA: 0.2 mg/dL (ref 0.0–1.0)
pH: 5.5 (ref 5.0–8.0)

## 2020-09-02 LAB — POC URINE PREG, ED: Preg Test, Ur: NEGATIVE

## 2020-09-02 MED ORDER — METRONIDAZOLE 500 MG PO TABS: 500.0000 mg | ORAL_TABLET | Freq: Two times a day (BID) | ORAL | 0 refills | Status: AC

## 2020-09-02 NOTE — ED Triage Notes (Signed)
Pt presents with vaginal bleeding, odor, and abdominal pain xs 2 days.

## 2020-09-02 NOTE — ED Provider Notes (Signed)
[name]    CSN: [id number] Arrival date & time: 09/02/20  1113      History   Chief Complaint Chief Complaint  Patient presents with  . Vaginal Discharge    HPI Margaret Cain is a 34 y.o. female.   HPI Patient is here for mild lower abdominal crampy pain.  Comes and goes.  Irregular vaginal spotting.  Comes and goes.  Vaginal odor, also intermittent.  No real vaginal discharge.  No rash or pain.  No dysuria or frequency.  No fever or chills.  No nausea vomiting or change in bowel habits.  No known exposure to illness.  Has had tubal ligation, is certain she is not pregnant Past Medical History:  Diagnosis Date  . Anemia   . Anxiety   . Depression    ppd with first child  . Gestational diabetes    diet controlled  . Headache(784.0)   . Hx of chlamydia infection   . Positive GBS test 03/18/2017  . Pregnancy induced hypertension   . Preterm labor   . Trichomonosis   . Urinary tract infection   . 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 Anemia 12/15/2019  . COVID-19 11/07/2019  . Trichomonas infection 04/22/2019  . Migraine headache 06/08/2017  . 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 COLPOSCOPY    . TUBAL LIGATION N/A 11/07/2019   Procedure: POST PARTUM TUBAL LIGATION;  Surgeon: Donnamae Jude, MD;  Location: [facility];  Service: Gynecology;  Laterality: N/A;    OB History    Gravida  9   Para  8   Term  7   Preterm  1   AB  1   Living  8     SAB  1   TAB  0   Ectopic  0   Multiple  0   Live Births  8            Home Medications    Prior to Admission medications   Medication Sig Start Date End Date Taking? Authorizing Provider  acetaminophen (TYLENOL) 500 MG tablet Take 1 tablet (500 mg total) by mouth every 6 (six) hours as needed. 11/09/19   FairMarin Shutter, MD  Blood  Pressure Monitoring (BLOOD PRESSURE KIT) DEVI 1 Device by Does not apply route daily. ICD 10: Z34.00 09/29/19   Emily Filbert, MD  ferrous sulfate 325 (65 FE) MG tablet Take 1 tablet (325 mg total) by mouth daily with breakfast. 11/09/19   Fair, Marin Shutter, MD  metroNIDAZOLE (FLAGYL) 500 MG tablet Take 1 tablet (500 mg total) by mouth 2 (two) times daily. 09/02/20   Raylene Everts, MD  Prenatal Vit-Fe Phos-FA-Omega (VITAFOL GUMMIES) 3.33-0.333-34.8 MG CHEW Chew 3 tablets by mouth daily.  05/12/19   [provider]    Family History Family History  Problem Relation Age of Onset  . Asthma Son   . Asthma Paternal Aunt   . Anesthesia problems Neg Hx   . Hypotension Neg Hx   . Malignant hyperthermia Neg Hx   . Pseudochol deficiency Neg Hx   . Other Neg Hx   . Cancer Neg Hx   . Heart disease Neg Hx   . Stroke Neg Hx   . Hearing loss Neg Hx     Social  History Social History   Tobacco Use  . Smoking status: Never Smoker  . Smokeless tobacco: Never Used  Vaping Use  . Vaping Use: Never used  Substance Use Topics  . Alcohol use: No  . Drug use: No     Allergies   Cheese flavor and Penicillins   Review of Systems Review of Systems  See HPI Physical Exam Triage Vital Signs ED Triage Vitals  Enc Vitals Group     BP 09/02/20 1143 108/66     Pulse Rate 09/02/20 1143 74     Resp 09/02/20 1143 17     Temp 09/02/20 1143 98.7 F (37.1 C)     Temp Source 09/02/20 1143 Oral     SpO2 09/02/20 1143 100 %     Weight --      Height --      Head Circumference --      Peak Flow --      Pain Score 09/02/20 1142 2     Pain Loc --      Pain Edu? --      Excl. in [place]? --    No data found.  Updated Vital Signs BP 108/66 (BP Location: Left Arm)   Pulse 74   Temp 98.7 F (37.1 C) (Oral)   Resp 17   SpO2 100%      Physical Exam Constitutional:      General: She is not in acute distress.    Appearance: She is well-developed.  HENT:     Head: Normocephalic and  atraumatic.     Mouth/Throat:     Comments: Mask is in place Eyes:     Conjunctiva/sclera: Conjunctivae normal.     Pupils: Pupils are equal, round, and reactive to light.  Cardiovascular:     Rate and Rhythm: Normal rate.  Pulmonary:     Effort: Pulmonary effort is normal. No respiratory distress.  Abdominal:     General: There is no distension.     Palpations: Abdomen is soft.     Comments: No abdominal tenderness.  No CVAT  Musculoskeletal:        General: Normal range of motion.     Cervical back: Normal range of motion.  Skin:    General: Skin is warm and dry.  Neurological:     Mental Status: She is alert.  Psychiatric:        Mood and Affect: Mood normal.        Behavior: Behavior normal.      UC Treatments / Results  Labs (all labs ordered are listed, but only abnormal results are displayed) Labs Reviewed  POCT URINALYSIS DIPSTICK, ED / UC - Abnormal; Notable for the following components:      Result Value   Bilirubin Urine SMALL (*)    Ketones, ur 40 (*)    All other components within normal limits  POC URINE PREG, ED  CERVICOVAGINAL ANCILLARY ONLY    EKG   Radiology No results found.  Procedures Procedures (including critical care time)  Medications Ordered in UC Medications - No data to display  Initial Impression / Assessment and Plan / UC Course  I have reviewed the triage vital signs and the nursing notes.  Pertinent labs & imaging results that were available during my care of the patient were reviewed by me and considered in my medical decision making (see chart for details).     Offered prescription for metronidazole pending test results.  With vaginal odor and irregular  bleeding I feel that this is the most likely cause of her symptoms.  Patient declines.  Would rather wait for her lab testing and have specific treatment.  Will follow up with her GYN Final Clinical Impressions(s) / UC Diagnoses   Final diagnoses:  Vaginal discharge      Discharge Instructions     Check my chart for your test results   ED Prescriptions    Medication Sig Dispense Auth. Provider   metroNIDAZOLE (FLAGYL) 500 MG tablet Take 1 tablet (500 mg total) by mouth 2 (two) times daily. 14 tablet Raylene Everts, MD     PDMP not reviewed this encounter.   Raylene Everts, MD 09/02/20 1345

## 2020-09-02 NOTE — Discharge Instructions (Addendum)
Check mychart for your test results.

## 2020-09-03 LAB — CERVICOVAGINAL ANCILLARY ONLY
Bacterial Vaginitis (gardnerella): NEGATIVE
Chlamydia: NEGATIVE
Comment: NEGATIVE
Comment: NEGATIVE
Comment: NEGATIVE
Comment: NORMAL
Neisseria Gonorrhea: NEGATIVE
Trichomonas: POSITIVE — AB

## 2024-05-21 ENCOUNTER — Encounter (HOSPITAL_COMMUNITY): Payer: Self-pay | Admitting: Emergency Medicine

## 2024-05-21 ENCOUNTER — Ambulatory Visit (HOSPITAL_COMMUNITY)
Admission: EM | Admit: 2024-05-21 | Discharge: 2024-05-21 | Disposition: A | Discharge status: Home / Self Care | Payer: MEDICAID

## 2024-05-21 ENCOUNTER — Other Ambulatory Visit: Payer: Self-pay

## 2024-05-21 DIAGNOSIS — Z113 Encounter for screening for infections with a predominantly sexual mode of transmission: Secondary | ICD-10-CM | POA: Insufficient documentation

## 2024-05-21 DIAGNOSIS — Z3202 Encounter for pregnancy test, result negative: Secondary | ICD-10-CM | POA: Diagnosis not present

## 2024-05-21 LAB — POCT URINALYSIS DIP (MANUAL ENTRY)
Bilirubin, UA: NEGATIVE
Blood, UA: NEGATIVE
Glucose, UA: NEGATIVE mg/dL
Nitrite, UA: POSITIVE — AB
Spec Grav, UA: 1.025 (ref 1.010–1.025)
Urobilinogen, UA: 1 U/dL
pH, UA: 7 (ref 5.0–8.0)

## 2024-05-21 LAB — POCT URINE PREGNANCY: Preg Test, Ur: NEGATIVE

## 2024-05-21 MED ORDER — NITROFURANTOIN MONOHYD MACRO 100 MG PO CAPS: 100.0000 mg | ORAL_CAPSULE | Freq: Two times a day (BID) | ORAL | 0 refills | Status: AC

## 2024-05-21 NOTE — ED Provider Notes (Signed)
 [facility]    CSN: [id number] Arrival date & time: 05/21/24  1334      History   Chief Complaint Chief Complaint  Patient presents with   SEXUALLY TRANSMITTED DISEASE    HPI Margaret Cain is a 38 y.o. female.   Patient is a 38 year old female who presents to the urgent care today wanting screening for STIs.  She reports being sexually active about 3 days ago and found out that her partner tested positive for chlamydia.  She does report having chlamydia over a month ago but was treated for it.  She denies any symptoms currently.  She also reports having some irregular periods over the past couple of months and would like to confirm that she is not pregnant.  She denies any burning with urination, vaginal discharge, rash, abdominal pain, vomiting, fever, back pain, blood in the urine, or other concerns at this time.    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    Patient Active Problem List   Diagnosis Date Noted   Anemia 12/15/2019   COVID-19 11/07/2019   Trichomonas infection 04/22/2019   Migraine headache 06/08/2017   ASCUS with positive high risk HPV cervical 10/21/2015    Past Surgical History:  Procedure Laterality Date   COLPOSCOPY     TUBAL LIGATION N/A 11/07/2019   Procedure: POST PARTUM TUBAL LIGATION;  Surgeon: Fredirick Glenys RAMAN, MD;  Location: [facility];  Service: Gynecology;  Laterality: N/A;    OB History     Gravida  9   Para  8   Term  7   Preterm  1   AB  1   Living  8      SAB  1   IAB  0   Ectopic  0   Multiple  0   Live Births  8            Home Medications    Prior to  Admission medications   Medication Sig Start Date End Date Taking? Authorizing Provider  nitrofurantoin , macrocrystal-monohydrate, (MACROBID ) 100 MG capsule Take 1 capsule (100 mg total) by mouth 2 (two) times daily. 05/21/24  Yes Melonie Locus, PA-C  acetaminophen  (TYLENOL ) 500 MG tablet Take 1 tablet (500 mg total) by mouth every 6 (six) hours as needed. 11/09/19   FairMitzie SAILOR, MD  Blood Pressure Monitoring (BLOOD PRESSURE KIT) DEVI 1 Device by Does not apply route daily. ICD 10: Z34.00 09/29/19   Starla Harland BROCKS, MD  ferrous sulfate  325 (65 FE) MG tablet Take 1 tablet (325 mg total) by mouth daily with breakfast. 11/09/19   Fair, Mitzie SAILOR, MD  metroNIDAZOLE  (FLAGYL ) 500 MG tablet Take 1 tablet (500 mg total) by mouth 2 (two) times daily. 09/02/20   Maranda Jamee Jacob, MD  Prenatal Vit-Fe Phos-FA-Omega (VITAFOL  GUMMIES) 3.33-0.333-34.8 MG CHEW Chew 3 tablets by mouth daily.  05/12/19   [provider]    Family History Family History  Problem Relation Age of Onset   Asthma Son    Asthma Paternal  Aunt    Anesthesia problems Neg Hx    Hypotension Neg Hx    Malignant hyperthermia Neg Hx    Pseudochol deficiency Neg Hx    Other Neg Hx    Cancer Neg Hx    Heart disease Neg Hx    Stroke Neg Hx    Hearing loss Neg Hx     Social History Social History   Tobacco Use   Smoking status: Never   Smokeless tobacco: Never  Vaping Use   Vaping status: Never Used  Substance Use Topics   Alcohol use: No   Drug use: No     Allergies   Cheese flavoring agent (non-screening) and Penicillins   Review of Systems Review of Systems See HPI for relevant ROS.  Physical Exam Triage Vital Signs ED Triage Vitals  Encounter Vitals Group     BP 05/21/24 1402 119/82     Girls Systolic BP Percentile --      Girls Diastolic BP Percentile --      Boys Systolic BP Percentile --      Boys Diastolic BP Percentile --      Pulse Rate 05/21/24 1402 72     Resp 05/21/24 1402 16     Temp  05/21/24 1402 98.6 F (37 C)     Temp Source 05/21/24 1402 Oral     SpO2 05/21/24 1402 97 %     Weight --      Height --      Head Circumference --      Peak Flow --      Pain Score 05/21/24 1357 0     Pain Loc --      Pain Education --      Exclude from Growth Chart --    No data found.  Updated Vital Signs BP 119/82 (BP Location: Left Arm)   Pulse 72   Temp 98.6 F (37 C) (Oral)   Resp 16   LMP 05/14/2024 Comment: june 12 and ended on june 16, june 20 and ended 6/26, last period was May 14, 2024 that ended yesterday  SpO2 97%   Visual Acuity Right Eye Distance:   Left Eye Distance:   Bilateral Distance:    Right Eye Near:   Left Eye Near:    Bilateral Near:     Physical Exam General: Alert and oriented, well-developed/well-nourished, calm, cooperative, no acute distress HEENT: Normocephalic atraumatic, moist mucous membranes, no scleral icterus, trachea midline, pharynx without any lesions or erythema Lungs: Speaking full sentences, non-labored respirations, no distress Heart: Regular rate and rhythm Abdomen:  Soft, nondistended Musculoskeletal: Moves all extremities well Neurologic: Awake, A&O x4, gait normal Back: No CVA tenderness Integumentary: Warm, dry, normal for ethnicity, intact, no rash Psychiatric: Appropriate mood & affect  UC Treatments / Results  Labs (all labs ordered are listed, but only abnormal results are displayed) Labs Reviewed  POCT URINALYSIS DIP (MANUAL ENTRY) - Abnormal; Notable for the following components:      Result Value   Clarity, UA turbid (*)    Ketones, POC UA trace (5) (*)    Protein Ur, POC trace (*)    Nitrite, UA Positive (*)    Leukocytes, UA Small (1+) (*)    All other components within normal limits  URINE CULTURE  POCT URINE PREGNANCY  CERVICOVAGINAL ANCILLARY ONLY    EKG   Radiology No results found.  Procedures Procedures (including critical care time)  Medications Ordered in UC Medications - No  data  to display  Initial Impression / Assessment and Plan / UC Course  I have reviewed the triage vital signs and the nursing notes.  Pertinent labs & imaging results that were available during my care of the patient were reviewed by me and considered in my medical decision making (see chart for details).    Presents wanting screening for STIs and pregnancy.  Differential Diagnosis: STI, UTI, BV, trichomoniasis, candidiasis, pregnancy, including other diagnoses.  Rationale: Patient presents wanting screening for STIs and pregnancy.  hCG was negative.  UA was obtained and patient had leukocytes and nitrates.  Prescribed patient Macrobid  for coverage of possible UTI.  Patient's urine culture was sent off.  Additionally, patient performed a self swab and we have sent this off to the lab.  Will call patient at that time and prescribe any medications if indicated.  Patient denies any symptoms currently including vaginal discharge, dysuria, back pain, abdominal pain, rash, or other concerns.  Recommended patient follow-up with her primary care provider with health department if she has any other concerns.  Discussed return precautions.   Disposition: Stable to discharge home.  All questions answered to the best of this examiner's ability. Reviewed possible severe sequelae and other reasons to return to urgent care or ED for further evaluation and/or treatment. Advised to f/u PCP w/in 48 to 72 hours for further eval and/or reassessment. Patient voices understanding of the above and agrees to plan.  An appropriate evaluation has been performed, and in my medical judgment there is currently no evidence of an immediate life-threatening or surgical condition. Discharge is therefore indicated at this time.  This document was created using the aid of voice recognition Scientist, clinical (histocompatibility and immunogenetics).  Final Clinical Impressions(s) / UC Diagnoses   Final diagnoses:  Screening examination for STI      Discharge Instructions      We have sent in an antibiotic to cover for possible UTI since you had signs of bacteria in the urine today.  We have sent your urine off for culture and should get these results in about 3 to 5 days.  We will call you at that time if we need to make any changes to your antibiotic.  Additionally, we have sent off your vaginal swab.  We should also get these results in about 3 to 5 days.  We will call you at that time if you test positive for anything and we will prescribe medication at that time if indicated.  We recommend following up with your primary care provider if you have any other questions.  Please return or go to the health department if you develop any vaginal discharge, rash, lower abdominal pain, or if you have any other concerns.    ED Prescriptions     Medication Sig Dispense Auth. Provider   nitrofurantoin , macrocrystal-monohydrate, (MACROBID ) 100 MG capsule Take 1 capsule (100 mg total) by mouth 2 (two) times daily. 10 capsule Melonie Locus, PA-C      PDMP not reviewed this encounter.   Melonie Locus, PA-C 05/21/24 1523

## 2024-05-21 NOTE — Discharge Instructions (Addendum)
 We have sent in an antibiotic to cover for possible UTI since you had signs of bacteria in the urine today.  We have sent your urine off for culture and should get these results in about 3 to 5 days.  We will call you at that time if we need to make any changes to your antibiotic.  Additionally, we have sent off your vaginal swab.  We should also get these results in about 3 to 5 days.  We will call you at that time if you test positive for anything and we will prescribe medication at that time if indicated.  We recommend following up with your primary care provider if you have any other questions.  Please return or go to the health department if you develop any vaginal discharge, rash, lower abdominal pain, or if you have any other concerns.

## 2024-05-21 NOTE — ED Triage Notes (Signed)
 Patient requesting std testing to include oral swabbing and pregnancy testing.

## 2024-05-21 NOTE — ED Notes (Addendum)
 Reports breast tenderness, headache and floaters in vision.  Denies abdominal pain, denies back pain.  Denies any vaginal itching or odor

## 2024-05-22 LAB — CERVICOVAGINAL ANCILLARY ONLY
Bacterial Vaginitis (gardnerella): NEGATIVE
Candida Glabrata: NEGATIVE
Candida Vaginitis: NEGATIVE
Chlamydia: NEGATIVE
Comment: NEGATIVE
Comment: NEGATIVE
Comment: NEGATIVE
Comment: NEGATIVE
Comment: NEGATIVE
Comment: NORMAL
Neisseria Gonorrhea: NEGATIVE
Trichomonas: NEGATIVE

## 2024-05-23 ENCOUNTER — Ambulatory Visit (HOSPITAL_COMMUNITY): Payer: Self-pay

## 2024-05-23 LAB — URINE CULTURE: Culture: >=100000 — AB

## 2024-07-18 ENCOUNTER — Ambulatory Visit
Admission: EM | Admit: 2024-07-18 | Discharge: 2024-07-18 | Disposition: A | Discharge status: Home / Self Care | Payer: MEDICAID | Attending: Internal Medicine | Admitting: Internal Medicine

## 2024-07-18 ENCOUNTER — Encounter: Payer: Self-pay | Admitting: Emergency Medicine

## 2024-07-18 DIAGNOSIS — R112 Nausea with vomiting, unspecified: Secondary | ICD-10-CM

## 2024-07-18 DIAGNOSIS — K529 Noninfective gastroenteritis and colitis, unspecified: Secondary | ICD-10-CM

## 2024-07-18 DIAGNOSIS — W57XXXA Bitten or stung by nonvenomous insect and other nonvenomous arthropods, initial encounter: Secondary | ICD-10-CM

## 2024-07-18 DIAGNOSIS — S70362A Insect bite (nonvenomous), left thigh, initial encounter: Secondary | ICD-10-CM

## 2024-07-18 LAB — POCT URINE DIPSTICK
Bilirubin, UA: NEGATIVE
Blood, UA: NEGATIVE
Glucose, UA: NEGATIVE mg/dL
Ketones, POC UA: NEGATIVE mg/dL
Nitrite, UA: NEGATIVE
Spec Grav, UA: 1.025 (ref 1.010–1.025)
Urobilinogen, UA: 1 U/dL
pH, UA: 6.5 (ref 5.0–8.0)

## 2024-07-18 MED ORDER — ONDANSETRON 4 MG PO TBDP
4.0000 mg | ORAL_TABLET | Freq: Once | ORAL | Status: AC
  Administered 2024-07-18: 4 mg via ORAL

## 2024-07-18 MED ORDER — ONDANSETRON 4 MG PO TBDP: 4.0000 mg | ORAL_TABLET | Freq: Three times a day (TID) | ORAL | 0 refills | Status: AC | PRN

## 2024-07-18 MED ORDER — HYDROCORTISONE 2.5 % EX LOTN: TOPICAL_LOTION | Freq: Two times a day (BID) | CUTANEOUS | 0 refills | Status: AC

## 2024-07-18 NOTE — ED Triage Notes (Signed)
 Pt presents c/o emesis, headache, and a bug bite x 1 day. Pt reports emesis started around 2:30 am this morning. Then she got sick and vomited again around 9 am. Pt denies diarrhea and fever. Pt reports she ate a lot of different food yesterday like [employer], [employer], and [employer] sandwich, etc..

## 2024-07-18 NOTE — ED Provider Notes (Signed)
 [facility]    CSN: [id number] Arrival date & time: 07/18/24  1805      History   Chief Complaint Chief Complaint  Patient presents with   Emesis   Insect Bite   Headache    HPI Margaret Cain is a 38 y.o. female.   38 year old female presents urgent care with complaints of nausea, vomiting and diarrhea.  This started yesterday late at night to early this morning.  It initially started with nausea and vomiting.  She then developed diarrhea.  Yesterday she did eat a lot of different foods including hibachi, [employer] and SEC sandwich.  She reports that she does continue to feel hungry despite having the nausea.  She has had several episodes of vomiting.  She also had an insect bite yesterday on the left thigh.  This area is very itchy.  She denies any fevers or chills.  She has developed a headache from vomiting.  She has not had any sick exposures.  She would like a note to stay out of work for couple of days.   Emesis Associated symptoms: abdominal pain, diarrhea and headaches   Associated symptoms: no arthralgias, no chills, no cough, no fever and no sore throat   Headache Associated symptoms: abdominal pain, diarrhea, nausea and vomiting   Associated symptoms: no back pain, no cough, no ear pain, no eye pain, no fever, no seizures and no sore throat     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    Patient Active Problem List   Diagnosis Date Noted   Anemia 12/15/2019   COVID-19 11/07/2019   Trichomonas infection 04/22/2019   Migraine headache 06/08/2017   ASCUS with positive high risk HPV cervical 10/21/2015    Past  Surgical History:  Procedure Laterality Date   COLPOSCOPY     TUBAL LIGATION N/A 11/07/2019   Procedure: POST PARTUM TUBAL LIGATION;  Surgeon: Fredirick Glenys RAMAN, MD;  Location: [facility];  Service: Gynecology;  Laterality: N/A;    OB History     Gravida  9   Para  8   Term  7   Preterm  1   AB  1   Living  8      SAB  1   IAB  0   Ectopic  0   Multiple  0   Live Births  8            Home Medications    Prior to Admission medications   Medication Sig Start Date End Date Taking? Authorizing Provider  hydrocortisone  2.5 % lotion Apply topically 2 (two) times daily. 07/18/24  Yes Shinita Mac A, PA-C  ondansetron  (ZOFRAN -ODT) 4 MG disintegrating tablet Take 1 tablet (4 mg total) by mouth every 8 (eight) hours as needed for nausea or vomiting. 07/18/24  Yes Ludger Bones A, PA-C  acetaminophen  (TYLENOL ) 500 MG tablet Take 1 tablet (500 mg total) by mouth every 6 (six) hours as needed. 11/09/19   Dotti Mitzie SAILOR, MD  Blood Pressure Monitoring (BLOOD PRESSURE KIT)  DEVI 1 Device by Does not apply route daily. ICD 10: Z34.00 09/29/19   Starla Harland BROCKS, MD  ferrous sulfate  325 (65 FE) MG tablet Take 1 tablet (325 mg total) by mouth daily with breakfast. 11/09/19   Fair, Mitzie SAILOR, MD  metroNIDAZOLE  (FLAGYL ) 500 MG tablet Take 1 tablet (500 mg total) by mouth 2 (two) times daily. 09/02/20   Maranda Jamee Jacob, MD  nitrofurantoin , macrocrystal-monohydrate, (MACROBID ) 100 MG capsule Take 1 capsule (100 mg total) by mouth 2 (two) times daily. 05/21/24   Melonie Locus, PA-C  Prenatal Vit-Fe Phos-FA-Omega (VITAFOL  GUMMIES) 3.33-0.333-34.8 MG CHEW Chew 3 tablets by mouth daily.  05/12/19   [provider]    Family History Family History  Problem Relation Age of Onset   Asthma Son    Asthma Paternal Aunt    Anesthesia problems Neg Hx    Hypotension Neg Hx    Malignant hyperthermia Neg Hx    Pseudochol deficiency Neg Hx    Other Neg Hx    Cancer Neg Hx    Heart  disease Neg Hx    Stroke Neg Hx    Hearing loss Neg Hx     Social History Social History   Tobacco Use   Smoking status: Never    Passive exposure: Never   Smokeless tobacco: Never  Vaping Use   Vaping status: Never Used  Substance Use Topics   Alcohol use: No   Drug use: No     Allergies   Cheese flavoring agent (non-screening) and Penicillins   Review of Systems Review of Systems  Constitutional:  Negative for chills and fever.  HENT:  Negative for ear pain and sore throat.   Eyes:  Negative for pain and visual disturbance.  Respiratory:  Negative for cough and shortness of breath.   Cardiovascular:  Negative for chest pain and palpitations.  Gastrointestinal:  Positive for abdominal pain, diarrhea, nausea and vomiting.  Genitourinary:  Negative for dysuria and hematuria.  Musculoskeletal:  Negative for arthralgias and back pain.  Skin:  Positive for rash. Negative for color change.  Neurological:  Positive for headaches. Negative for seizures and syncope.  All other systems reviewed and are negative.    Physical Exam Triage Vital Signs ED Triage Vitals  Encounter Vitals Group     BP 07/18/24 1831 101/65     Girls Systolic BP Percentile --      Girls Diastolic BP Percentile --      Boys Systolic BP Percentile --      Boys Diastolic BP Percentile --      Pulse Rate 07/18/24 1831 64     Resp 07/18/24 1831 16     Temp 07/18/24 1831 98.1 F (36.7 C)     Temp Source 07/18/24 1831 Oral     SpO2 07/18/24 1831 98 %     Weight 07/18/24 1830 113 lb 15.7 oz (51.7 kg)     Height --      Head Circumference --      Peak Flow --      Pain Score 07/18/24 1829 7     Pain Loc --      Pain Education --      Exclude from Growth Chart --    No data found.  Updated Vital Signs BP 101/65 (BP Location: Left Arm)   Pulse 64   Temp 98.1 F (36.7 C) (Oral)   Resp 16   Wt 113 lb 15.7 oz (51.7 kg)   LMP  07/10/2024 (Exact Date)   SpO2 98%   BMI 22.26 kg/m   Visual  Acuity Right Eye Distance:   Left Eye Distance:   Bilateral Distance:    Right Eye Near:   Left Eye Near:    Bilateral Near:     Physical Exam Vitals and nursing note reviewed.  Constitutional:      General: She is not in acute distress.    Appearance: She is well-developed.  HENT:     Head: Normocephalic and atraumatic.  Eyes:     Conjunctiva/sclera: Conjunctivae normal.  Cardiovascular:     Rate and Rhythm: Normal rate and regular rhythm.     Heart sounds: No murmur heard. Pulmonary:     Effort: Pulmonary effort is normal. No respiratory distress.     Breath sounds: Normal breath sounds.  Abdominal:     General: Bowel sounds are normal.     Palpations: Abdomen is soft.     Tenderness: There is abdominal tenderness in the left upper quadrant. There is no guarding or rebound.     Hernia: No hernia is present.   Musculoskeletal:        General: No swelling.     Cervical back: Neck supple.  Skin:    General: Skin is warm and dry.     Capillary Refill: Capillary refill takes less than 2 seconds.      Neurological:     Mental Status: She is alert.  Psychiatric:        Mood and Affect: Mood normal.      UC Treatments / Results  Labs (all labs ordered are listed, but only abnormal results are displayed) Labs Reviewed  POCT URINE DIPSTICK - Abnormal; Notable for the following components:      Result Value   Color, UA other (*)    Clarity, UA cloudy (*)    Protein Ur, POC trace (*)    Leukocytes, UA Trace (*)    All other components within normal limits    EKG   Radiology No results found.  Procedures Procedures (including critical care time)  Medications Ordered in UC Medications  ondansetron  (ZOFRAN -ODT) disintegrating tablet 4 mg (has no administration in time range)    Initial Impression / Assessment and Plan / UC Course  I have reviewed the triage vital signs and the nursing notes.  Pertinent labs & imaging results that were available during my  care of the patient were reviewed by me and considered in my medical decision making (see chart for details).     Insect bite of left thigh, initial encounter  Gastroenteritis  Nausea and vomiting, unspecified vomiting type   Symptoms and physical exam findings are most consistent with gastroenteritis.  This could be associated with a foodborne illness.  This normally takes 2 to 3 days to completely resolve.  We will give you a dose of Zofran  today to help with nausea and send a prescription for this.  Your next dose will be due in 8 hours at 3 AM.  For the insect bite we will call in hydrocortisone  cream to apply twice a day for no more than 10 days as needed for itching.  Recommend discontinuing after 10 days for at least 7 to 10 days before using this again. Return to urgent care or PCP if symptoms worsen or fail to resolve.    Final Clinical Impressions(s) / UC Diagnoses   Final diagnoses:  Insect bite of left thigh, initial encounter  Gastroenteritis  Nausea and  vomiting, unspecified vomiting type     Discharge Instructions      Symptoms and physical exam findings are most consistent with gastroenteritis.  This could be associated with a foodborne illness.  This normally takes 2 to 3 days to completely resolve.  We will give you a dose of Zofran  today to help with nausea and send a prescription for this.  Your next dose will be due in 8 hours at 3 AM.  For the insect bite we will call in hydrocortisone  cream to apply twice a day for no more than 10 days as needed for itching.  Recommend discontinuing after 10 days for at least 7 to 10 days before using this again. Return to urgent care or PCP if symptoms worsen or fail to resolve.       ED Prescriptions     Medication Sig Dispense Auth. Provider   ondansetron  (ZOFRAN -ODT) 4 MG disintegrating tablet Take 1 tablet (4 mg total) by mouth every 8 (eight) hours as needed for nausea or vomiting. 20 tablet Teresa Norris A, PA-C    hydrocortisone  2.5 % lotion Apply topically 2 (two) times daily. 59 mL Teresa Norris LABOR, [place]      PDMP not reviewed this encounter.   Teresa Norris LABOR, [place] 07/18/24 1857

## 2024-07-18 NOTE — Discharge Instructions (Addendum)
 Symptoms and physical exam findings are most consistent with gastroenteritis.  This could be associated with a foodborne illness.  This normally takes 2 to 3 days to completely resolve.  We will give you a dose of Zofran  today to help with nausea and send a prescription for this.  Your next dose will be due in 8 hours at 3 AM.  For the insect bite we will call in hydrocortisone  cream to apply twice a day for no more than 10 days as needed for itching.  Recommend discontinuing after 10 days for at least 7 to 10 days before using this again. Return to urgent care or PCP if symptoms worsen or fail to resolve.

## 2024-09-02 ENCOUNTER — Encounter (HOSPITAL_COMMUNITY): Payer: Self-pay | Admitting: *Deleted

## 2024-09-02 ENCOUNTER — Other Ambulatory Visit: Payer: Self-pay

## 2024-09-02 ENCOUNTER — Emergency Department (HOSPITAL_COMMUNITY): Payer: MEDICAID

## 2024-09-02 ENCOUNTER — Emergency Department (HOSPITAL_COMMUNITY)
Admission: EM | Admit: 2024-09-02 | Discharge: 2024-09-03 | Disposition: A | Discharge status: Home / Self Care | Payer: MEDICAID | Attending: Emergency Medicine | Admitting: Emergency Medicine

## 2024-09-02 DIAGNOSIS — M25511 Pain in right shoulder: Secondary | ICD-10-CM | POA: Diagnosis present

## 2024-09-02 LAB — HCG, SERUM, QUALITATIVE: Preg, Serum: NEGATIVE

## 2024-09-02 NOTE — ED Triage Notes (Signed)
 Pt says that waking up three days ago, she started having pain in the right arm and shoulder area, pain goes up through her neck. She feels like she has swelling in her arm. Has been taking ibuprofen  without relief. Pt also says she is unsure if she is pregnant because she has only spotted the past two months. Would like work note at discharge

## 2024-09-02 NOTE — ED Provider Triage Note (Signed)
 Emergency Medicine Provider Triage Evaluation Note  Margaret Cain , a 38 y.o. female  was evaluated in triage.  Pt complains of right shoulder pain. She states she slept on it funny and woke up three days ago with a sore shoulder with some pain into the lateral right neck. She denies any trauma. She is also requesting a pregnancy test  Review of Systems  Positive:  Negative:   Physical Exam  BP 106/80 (BP Location: Left Arm)   Pulse 77   Temp 97.9 F (36.6 C) (Oral)   Resp 14   LMP 07/01/2024   SpO2 100%  Gen:   Awake, no distress   Resp:  Normal effort  MSK:   Moves extremities without difficulty  Other:    Medical Decision Making  Medically screening exam initiated at 10:59 PM.  Appropriate orders placed.  Margaret Cain was informed that the remainder of the evaluation will be completed by another provider, this initial triage assessment does not replace that evaluation, and the importance of remaining in the ED until their evaluation is complete.     Logan Ubaldo NOVAK, PA-C 09/02/24 2300

## 2024-09-02 NOTE — ED Triage Notes (Signed)
 Pt arrives via [facility] from home, per their report pt has been having pain in her right shoulder, radiates up to the right neck since waking up 3 days ago. Can move extremity, but with pain. En route vitals 96/68, hr 72, cbg 119.

## 2024-09-03 MED ORDER — IBUPROFEN 400 MG PO TABS
600.0000 mg | ORAL_TABLET | Freq: Once | ORAL | Status: AC
  Administered 2024-09-03: 600 mg via ORAL
  Filled 2024-09-03: qty 1

## 2024-09-03 NOTE — ED Provider Notes (Signed)
 [place] [facility] AT [facility] Provider Note   CSN: [id number] Arrival date & time: 09/02/24  2246     Patient presents with: Shoulder Pain   Margaret Cain is a 38 y.o. female.  Patient with past medical history significant for anemia, anxiety presents emergency room complaining of right shoulder pain secondary to sleeping funny on it.  She also has some pain that radiates up to the right side of her neck.  She denies any trauma to the area.  She denies any numbness, tingling, weakness.  Patient also states that she has had vaginal spotting for the past 2 months and would like a pregnancy test.  She denies any vaginal pain or discharge.    Shoulder Pain      Prior to Admission medications   Medication Sig Start Date End Date Taking? Authorizing Provider  acetaminophen  (TYLENOL ) 500 MG tablet Take 1 tablet (500 mg total) by mouth every 6 (six) hours as needed. 11/09/19   FairMitzie SAILOR, MD  Blood Pressure Monitoring (BLOOD PRESSURE KIT) DEVI 1 Device by Does not apply route daily. ICD 10: Z34.00 09/29/19   Starla Harland BROCKS, MD  ferrous sulfate  325 (65 FE) MG tablet Take 1 tablet (325 mg total) by mouth daily with breakfast. 11/09/19   Fair, Mitzie SAILOR, MD  hydrocortisone  2.5 % lotion Apply topically 2 (two) times daily. 07/18/24   White, Elizabeth A, PA-C  metroNIDAZOLE  (FLAGYL ) 500 MG tablet Take 1 tablet (500 mg total) by mouth 2 (two) times daily. 09/02/20   Maranda Jamee Jacob, MD  nitrofurantoin , macrocrystal-monohydrate, (MACROBID ) 100 MG capsule Take 1 capsule (100 mg total) by mouth 2 (two) times daily. 05/21/24   Melonie Locus, PA-C  ondansetron  (ZOFRAN -ODT) 4 MG disintegrating tablet Take 1 tablet (4 mg total) by mouth every 8 (eight) hours as needed for nausea or vomiting. 07/18/24   Teresa Almarie LABOR, PA-C  Prenatal Vit-Fe Phos-FA-Omega (VITAFOL  GUMMIES) 3.33-0.333-34.8 MG CHEW Chew 3 tablets by mouth daily.  05/12/19   [provider]     Allergies: Cheese flavoring agent (non-screening) and Penicillins    Review of Systems  Updated Vital Signs BP 106/80 (BP Location: Left Arm)   Pulse 77   Temp 97.9 F (36.6 C) (Oral)   Resp 14   LMP 07/01/2024   SpO2 100%   Physical Exam Vitals and nursing note reviewed.  HENT:     Head: Normocephalic and atraumatic.  Eyes:     Pupils: Pupils are equal, round, and reactive to light.  Pulmonary:     Effort: Pulmonary effort is normal. No respiratory distress.  Musculoskeletal:        General: Tenderness present. No swelling, deformity or signs of injury. Normal range of motion.     Cervical back: Normal range of motion.     Comments: Grossly normal range of motion of the right shoulder with sensation intact.  Patient does have some tenderness to palpation of the anterior humeral head  Skin:    General: Skin is dry.  Neurological:     Mental Status: She is alert.  Psychiatric:        Speech: Speech normal.        Behavior: Behavior normal.     (all labs ordered are listed, but only abnormal results are displayed) Labs Reviewed  HCG, SERUM, QUALITATIVE    EKG: None  Radiology: DG Shoulder Right Result Date: 09/02/2024 CLINICAL DATA:  Shoulder pain EXAM: RIGHT SHOULDER - 2+ VIEW COMPARISON:  None Available. FINDINGS: There is no evidence of fracture or dislocation. There is no evidence of arthropathy or other focal bone abnormality. Soft tissues are unremarkable. IMPRESSION: Negative. Electronically Signed   By: Greig Pique M.D.   On: 09/02/2024 23:43     Procedures   Medications Ordered in the ED  ibuprofen  (ADVIL ) tablet 600 mg (has no administration in time range)                                    Medical Decision Making Amount and/or Complexity of Data Reviewed Labs: ordered. Radiology: ordered.   This patient presents to the ED for concern of shoulder pain, this involves an extensive number of treatment options, and is a complaint that  carries with it a high risk of complications and morbidity.  The differential diagnosis includes fracture, dislocation, soft tissue injury, others   Co morbidities / Chronic conditions that complicate the patient evaluation  As noted in HPI   Additional history obtained:  Additional history obtained from EMR   Lab Tests:  I Ordered, and personally interpreted labs.  The pertinent results include: Negative pregnancy test   Imaging Studies ordered:  I ordered imaging studies including plain films of the right shoulder I independently visualized and interpreted imaging which showed no acute findings I agree with the radiologist interpretation   Problem List / ED Course / Critical interventions / Medication management   I ordered medication including ibuprofen  Reevaluation of the patient after these medicines showed that the patient improved I have reviewed the patients home medicines and have made adjustments as needed   Test / Admission - Considered:  Patient with pain in the right shoulder with unremarkable x-rays.  Suspect muscular strain versus possible cervical radiculopathy.  No indication for further emergent workup or admission.  Patient informed of her negative test result.  Patient is stable for discharge.  She will follow-up with orthopedics for further evaluation as needed      Final diagnoses:  Acute pain of right shoulder    ED Discharge Orders     None          Logan Ubaldo KATHEE DEVONNA 09/03/24 0157    Raford Lenis, MD 09/03/24 [phone]

## 2024-09-03 NOTE — Discharge Instructions (Signed)
 Your x-rays this morning were reassuring.  You may use a sling for comfort if you find it beneficial.  You may take Tylenol  and ibuprofen  for continued pain management.  Follow-up with orthopedic surgery for further evaluation as needed.  Your pregnancy test was negative.  Return to the emergency department if you develop any life-threatening symptoms
